# Patient Record
Sex: Female | Born: 1945 | Race: White | Hispanic: No
Health system: Southern US, Academic
[De-identification: ages and names within clinical notes are randomized; demographics above are authoritative.]

## PROBLEM LIST (undated history)

## (undated) ENCOUNTER — Encounter

## (undated) ENCOUNTER — Ambulatory Visit

## (undated) ENCOUNTER — Encounter: Attending: Nephrology | Primary: Nephrology

## (undated) ENCOUNTER — Ambulatory Visit: Payer: MEDICARE

## (undated) ENCOUNTER — Telehealth

## (undated) ENCOUNTER — Ambulatory Visit: Payer: MEDICARE | Attending: Nephrology | Primary: Nephrology

## (undated) ENCOUNTER — Ambulatory Visit
Payer: MEDICARE | Attending: Student in an Organized Health Care Education/Training Program | Primary: Student in an Organized Health Care Education/Training Program

## (undated) DIAGNOSIS — E119 Type 2 diabetes mellitus without complications: Secondary | ICD-10-CM

## (undated) DIAGNOSIS — J189 Pneumonia, unspecified organism: Secondary | ICD-10-CM

## (undated) DIAGNOSIS — N186 End stage renal disease: Secondary | ICD-10-CM

## (undated) DIAGNOSIS — C801 Malignant (primary) neoplasm, unspecified: Secondary | ICD-10-CM

## (undated) DIAGNOSIS — N289 Disorder of kidney and ureter, unspecified: Secondary | ICD-10-CM

## (undated) DIAGNOSIS — E039 Hypothyroidism, unspecified: Secondary | ICD-10-CM

## (undated) DIAGNOSIS — M1990 Unspecified osteoarthritis, unspecified site: Secondary | ICD-10-CM

## (undated) HISTORY — PX: TUBAL LIGATION: SHX77

## (undated) HISTORY — DX: Unspecified osteoarthritis, unspecified site: M19.90

## (undated) HISTORY — PX: TONSILLECTOMY: SUR1361

## (undated) HISTORY — PX: EYE SURGERY: SHX253

## (undated) HISTORY — DX: End stage renal disease: N18.6

## (undated) HISTORY — DX: Type 2 diabetes mellitus without complications: E11.9

## (undated) HISTORY — PX: SPINE SURGERY: SHX786

## (undated) HISTORY — DX: Malignant (primary) neoplasm, unspecified: C80.1

## (undated) HISTORY — PX: CATARACT EXTRACTION, BILATERAL: SHX1313

---

## 1997-12-25 HISTORY — PX: OTHER SURGICAL HISTORY: SHX169

## 2005-08-21 ENCOUNTER — Ambulatory Visit: Payer: Self-pay | Admitting: General Surgery

## 2005-12-12 ENCOUNTER — Ambulatory Visit: Payer: Self-pay | Admitting: Internal Medicine

## 2006-07-14 ENCOUNTER — Emergency Department: Payer: Self-pay | Admitting: Emergency Medicine

## 2007-05-05 ENCOUNTER — Emergency Department: Payer: Self-pay | Admitting: Emergency Medicine

## 2010-11-22 ENCOUNTER — Other Ambulatory Visit: Payer: Self-pay | Admitting: Nephrology

## 2010-11-28 ENCOUNTER — Ambulatory Visit: Payer: Self-pay | Admitting: Internal Medicine

## 2010-12-02 ENCOUNTER — Ambulatory Visit: Payer: Self-pay | Admitting: General Surgery

## 2010-12-02 HISTORY — PX: COLONOSCOPY: SHX174

## 2010-12-12 ENCOUNTER — Ambulatory Visit: Payer: Self-pay | Admitting: Internal Medicine

## 2011-04-10 DIAGNOSIS — E113599 Type 2 diabetes mellitus with proliferative diabetic retinopathy without macular edema, unspecified eye: Secondary | ICD-10-CM | POA: Insufficient documentation

## 2012-01-23 ENCOUNTER — Ambulatory Visit: Payer: Self-pay | Admitting: Internal Medicine

## 2012-12-25 HISTORY — PX: INSERTION OF DIALYSIS CATHETER: SHX1324

## 2013-01-23 ENCOUNTER — Ambulatory Visit: Payer: Self-pay | Admitting: Internal Medicine

## 2013-03-28 ENCOUNTER — Ambulatory Visit: Payer: Self-pay | Admitting: Internal Medicine

## 2013-07-30 ENCOUNTER — Ambulatory Visit: Payer: Self-pay | Admitting: Internal Medicine

## 2013-10-10 ENCOUNTER — Emergency Department: Payer: Self-pay | Admitting: Emergency Medicine

## 2013-11-03 ENCOUNTER — Ambulatory Visit: Payer: Self-pay | Admitting: Internal Medicine

## 2013-11-03 LAB — CBC CANCER CENTER
Eosinophil #: 0.2 x10 3/mm (ref 0.0–0.7)
Eosinophil %: 2.3 %
HGB: 13 g/dL (ref 12.0–16.0)
Lymphocyte #: 2.5 x10 3/mm (ref 1.0–3.6)
Lymphocyte %: 23 %
MCH: 29.3 pg (ref 26.0–34.0)
MCV: 90 fL (ref 80–100)
Monocyte #: 1 x10 3/mm — ABNORMAL HIGH (ref 0.2–0.9)
Monocyte %: 8.9 %
Neutrophil #: 7 x10 3/mm — ABNORMAL HIGH (ref 1.4–6.5)
Neutrophil %: 65.1 %
Platelet: 635 x10 3/mm — ABNORMAL HIGH (ref 150–440)
RBC: 4.46 10*6/uL (ref 3.80–5.20)
RDW: 16 % — ABNORMAL HIGH (ref 11.5–14.5)
WBC: 10.7 x10 3/mm (ref 3.6–11.0)

## 2013-11-03 LAB — SEDIMENTATION RATE: Erythrocyte Sed Rate: 20 mm/hr (ref 0–30)

## 2013-11-03 LAB — IRON AND TIBC
Iron Saturation: 12 %
Iron: 45 ug/dL — ABNORMAL LOW (ref 50–170)
Unbound Iron-Bind.Cap.: 339 ug/dL

## 2013-11-03 LAB — FERRITIN: Ferritin (ARMC): 16 ng/mL (ref 8–388)

## 2013-11-07 LAB — OCCULT BLOOD X 1 CARD TO LAB, STOOL
Occult Blood, Feces: NEGATIVE
Occult Blood, Feces: NEGATIVE

## 2013-11-24 ENCOUNTER — Ambulatory Visit: Payer: Self-pay | Admitting: Internal Medicine

## 2014-01-26 ENCOUNTER — Ambulatory Visit: Payer: Self-pay | Admitting: Internal Medicine

## 2014-03-17 ENCOUNTER — Ambulatory Visit: Payer: Self-pay | Admitting: Internal Medicine

## 2014-03-18 LAB — CBC CANCER CENTER
BASOS ABS: 0.1 x10 3/mm (ref 0.0–0.1)
BASOS PCT: 0.8 %
EOS ABS: 0.3 x10 3/mm (ref 0.0–0.7)
EOS PCT: 3.2 %
HCT: 43.9 % (ref 35.0–47.0)
HGB: 14.6 g/dL (ref 12.0–16.0)
LYMPHS PCT: 24.7 %
Lymphocyte #: 2.1 x10 3/mm (ref 1.0–3.6)
MCH: 31.6 pg (ref 26.0–34.0)
MCHC: 33.3 g/dL (ref 32.0–36.0)
MCV: 95 fL (ref 80–100)
MONO ABS: 0.9 x10 3/mm (ref 0.2–0.9)
Monocyte %: 10.6 %
Neutrophil #: 5.2 x10 3/mm (ref 1.4–6.5)
Neutrophil %: 60.7 %
Platelet: 524 x10 3/mm — ABNORMAL HIGH (ref 150–440)
RBC: 4.61 10*6/uL (ref 3.80–5.20)
RDW: 17.6 % — ABNORMAL HIGH (ref 11.5–14.5)
WBC: 8.5 x10 3/mm (ref 3.6–11.0)

## 2014-03-18 LAB — IRON AND TIBC
IRON BIND. CAP.(TOTAL): 275 ug/dL (ref 250–450)
Iron Saturation: 33 %
Iron: 91 ug/dL (ref 50–170)
Unbound Iron-Bind.Cap.: 184 ug/dL

## 2014-03-18 LAB — FERRITIN: Ferritin (ARMC): 237 ng/mL (ref 8–388)

## 2014-03-25 ENCOUNTER — Ambulatory Visit: Payer: Self-pay | Admitting: Internal Medicine

## 2014-08-13 ENCOUNTER — Ambulatory Visit: Payer: Self-pay | Admitting: Internal Medicine

## 2014-08-14 LAB — IRON AND TIBC
IRON BIND. CAP.(TOTAL): 336 ug/dL (ref 250–450)
Iron Saturation: 24 %
Iron: 80 ug/dL (ref 50–170)
UNBOUND IRON-BIND. CAP.: 256 ug/dL

## 2014-08-14 LAB — CBC CANCER CENTER
Basophil #: 0.1 x10 3/mm (ref 0.0–0.1)
Basophil %: 1.1 %
Eosinophil #: 0.3 x10 3/mm (ref 0.0–0.7)
Eosinophil %: 3.2 %
HCT: 45.9 % (ref 35.0–47.0)
HGB: 14.9 g/dL (ref 12.0–16.0)
LYMPHS PCT: 22.1 %
Lymphocyte #: 2.3 x10 3/mm (ref 1.0–3.6)
MCH: 31.7 pg (ref 26.0–34.0)
MCHC: 32.4 g/dL (ref 32.0–36.0)
MCV: 98 fL (ref 80–100)
Monocyte #: 1.2 x10 3/mm — ABNORMAL HIGH (ref 0.2–0.9)
Monocyte %: 11.1 %
NEUTROS ABS: 6.6 x10 3/mm — AB (ref 1.4–6.5)
Neutrophil %: 62.5 %
PLATELETS: 496 x10 3/mm — AB (ref 150–440)
RBC: 4.69 10*6/uL (ref 3.80–5.20)
RDW: 15.5 % — AB (ref 11.5–14.5)
WBC: 10.6 x10 3/mm (ref 3.6–11.0)

## 2014-08-14 LAB — FERRITIN: Ferritin (ARMC): 29 ng/mL (ref 8–388)

## 2014-08-19 LAB — CANCER CENTER HEMATOCRIT: HCT: 44.4 % (ref 35.0–47.0)

## 2014-08-25 ENCOUNTER — Ambulatory Visit: Payer: Self-pay | Admitting: Internal Medicine

## 2014-08-26 LAB — CANCER CENTER HEMATOCRIT: HCT: 41.6 % (ref 35.0–47.0)

## 2014-09-02 LAB — CANCER CENTER HEMATOCRIT: HCT: 42 % (ref 35.0–47.0)

## 2014-09-09 LAB — CBC CANCER CENTER
Basophil #: 0.1 x10 3/mm (ref 0.0–0.1)
Basophil %: 0.6 %
EOS ABS: 0.3 x10 3/mm (ref 0.0–0.7)
Eosinophil %: 2 %
HCT: 44.2 % (ref 35.0–47.0)
HGB: 14.2 g/dL (ref 12.0–16.0)
Lymphocyte #: 2.3 x10 3/mm (ref 1.0–3.6)
Lymphocyte %: 16.2 %
MCH: 31 pg (ref 26.0–34.0)
MCHC: 32.1 g/dL (ref 32.0–36.0)
MCV: 96 fL (ref 80–100)
MONO ABS: 1.2 x10 3/mm — AB (ref 0.2–0.9)
Monocyte %: 8.3 %
NEUTROS ABS: 10.3 x10 3/mm — AB (ref 1.4–6.5)
NEUTROS PCT: 72.9 %
Platelet: 581 x10 3/mm — ABNORMAL HIGH (ref 150–440)
RBC: 4.58 10*6/uL (ref 3.80–5.20)
RDW: 15.5 % — ABNORMAL HIGH (ref 11.5–14.5)
WBC: 14.1 x10 3/mm — ABNORMAL HIGH (ref 3.6–11.0)

## 2014-09-24 ENCOUNTER — Ambulatory Visit: Payer: Self-pay | Admitting: Internal Medicine

## 2014-09-30 LAB — CANCER CENTER HEMATOCRIT: HCT: 38.9 % (ref 35.0–47.0)

## 2014-10-20 HISTORY — PX: NEPHRECTOMY TRANSPLANTED ORGAN: SUR880

## 2014-10-25 ENCOUNTER — Ambulatory Visit: Payer: Self-pay | Admitting: Internal Medicine

## 2014-11-02 ENCOUNTER — Ambulatory Visit: Payer: Self-pay | Admitting: Nephrology

## 2014-11-02 LAB — BASIC METABOLIC PANEL
Anion Gap: 6 — ABNORMAL LOW (ref 7–16)
BUN: 17 mg/dL (ref 7–18)
CALCIUM: 8.2 mg/dL — AB (ref 8.5–10.1)
Chloride: 102 mmol/L (ref 98–107)
Co2: 29 mmol/L (ref 21–32)
Creatinine: 1.19 mg/dL (ref 0.60–1.30)
EGFR (Non-African Amer.): 48 — ABNORMAL LOW
GFR CALC AF AMER: 58 — AB
Glucose: 152 mg/dL — ABNORMAL HIGH (ref 65–99)
Osmolality: 278 (ref 275–301)
POTASSIUM: 4.1 mmol/L (ref 3.5–5.1)
SODIUM: 137 mmol/L (ref 136–145)

## 2014-11-02 LAB — CBC WITH DIFFERENTIAL/PLATELET
BASOS ABS: 0 10*3/uL (ref 0.0–0.1)
Basophil %: 0.6 %
Eosinophil #: 0.1 10*3/uL (ref 0.0–0.7)
Eosinophil %: 1.9 %
HCT: 35.6 % (ref 35.0–47.0)
HGB: 11.4 g/dL — AB (ref 12.0–16.0)
Lymphocyte #: 0.6 10*3/uL — ABNORMAL LOW (ref 1.0–3.6)
Lymphocyte %: 8 %
MCH: 28.3 pg (ref 26.0–34.0)
MCHC: 32 g/dL (ref 32.0–36.0)
MCV: 89 fL (ref 80–100)
MONOS PCT: 4.6 %
Monocyte #: 0.3 x10 3/mm (ref 0.2–0.9)
Neutrophil #: 6.1 10*3/uL (ref 1.4–6.5)
Neutrophil %: 84.9 %
Platelet: 529 10*3/uL — ABNORMAL HIGH (ref 150–440)
RBC: 4.01 10*6/uL (ref 3.80–5.20)
RDW: 16.1 % — ABNORMAL HIGH (ref 11.5–14.5)
WBC: 7.2 10*3/uL (ref 3.6–11.0)

## 2014-11-02 LAB — ALT: SGPT (ALT): 36 U/L

## 2014-11-02 LAB — ALKALINE PHOSPHATASE: Alkaline Phosphatase: 81 U/L

## 2014-11-02 LAB — BILIRUBIN, TOTAL: BILIRUBIN TOTAL: 0.6 mg/dL (ref 0.2–1.0)

## 2014-11-02 LAB — SGOT (AST)(ARMC): AST: 27 U/L (ref 15–37)

## 2014-11-02 LAB — PHOSPHORUS: Phosphorus: 2.8 mg/dL (ref 2.5–4.9)

## 2014-11-02 LAB — ALBUMIN: ALBUMIN: 2.8 g/dL — AB (ref 3.4–5.0)

## 2014-11-02 LAB — GAMMA GT: GGT: 13 U/L (ref 5–85)

## 2014-11-02 LAB — MAGNESIUM: Magnesium: 1.8 mg/dL

## 2014-11-09 ENCOUNTER — Encounter: Payer: Self-pay | Admitting: Nephrology

## 2014-11-09 LAB — BASIC METABOLIC PANEL
Anion Gap: 7 (ref 7–16)
BUN: 13 mg/dL (ref 7–18)
CO2: 29 mmol/L (ref 21–32)
Calcium, Total: 8.8 mg/dL (ref 8.5–10.1)
Chloride: 103 mmol/L (ref 98–107)
Creatinine: 1.19 mg/dL (ref 0.60–1.30)
GFR CALC AF AMER: 58 — AB
GFR CALC NON AF AMER: 48 — AB
Glucose: 131 mg/dL — ABNORMAL HIGH (ref 65–99)
Osmolality: 279 (ref 275–301)
Potassium: 4.4 mmol/L (ref 3.5–5.1)
Sodium: 139 mmol/L (ref 136–145)

## 2014-11-09 LAB — MAGNESIUM: Magnesium: 2.2 mg/dL

## 2014-11-09 LAB — CBC WITH DIFFERENTIAL/PLATELET
Basophil #: 0.1 10*3/uL (ref 0.0–0.1)
Basophil %: 1.3 %
EOS PCT: 3.1 %
Eosinophil #: 0.1 10*3/uL (ref 0.0–0.7)
HCT: 36.6 % (ref 35.0–47.0)
HGB: 11.6 g/dL — AB (ref 12.0–16.0)
Lymphocyte #: 0.6 10*3/uL — ABNORMAL LOW (ref 1.0–3.6)
Lymphocyte %: 13.6 %
MCH: 28 pg (ref 26.0–34.0)
MCHC: 31.6 g/dL — ABNORMAL LOW (ref 32.0–36.0)
MCV: 88 fL (ref 80–100)
MONOS PCT: 13.4 %
Monocyte #: 0.6 x10 3/mm (ref 0.2–0.9)
NEUTROS PCT: 68.6 %
Neutrophil #: 3 10*3/uL (ref 1.4–6.5)
Platelet: 434 10*3/uL (ref 150–440)
RBC: 4.14 10*6/uL (ref 3.80–5.20)
RDW: 16.1 % — ABNORMAL HIGH (ref 11.5–14.5)
WBC: 4.4 10*3/uL (ref 3.6–11.0)

## 2014-11-09 LAB — PHOSPHORUS: Phosphorus: 3.8 mg/dL (ref 2.5–4.9)

## 2014-11-11 LAB — BASIC METABOLIC PANEL
Anion Gap: 9 (ref 7–16)
BUN: 14 mg/dL (ref 7–18)
Calcium, Total: 8.6 mg/dL (ref 8.5–10.1)
Chloride: 103 mmol/L (ref 98–107)
Co2: 28 mmol/L (ref 21–32)
Creatinine: 1.14 mg/dL (ref 0.60–1.30)
EGFR (African American): >60
GFR CALC NON AF AMER: 50 — AB
GLUCOSE: 136 mg/dL — AB (ref 65–99)
Osmolality: 282 (ref 275–301)
Potassium: 4 mmol/L (ref 3.5–5.1)
Sodium: 140 mmol/L (ref 136–145)

## 2014-11-11 LAB — CBC WITH DIFFERENTIAL/PLATELET
BASOS ABS: 0.1 10*3/uL (ref 0.0–0.1)
Basophil %: 2.3 %
EOS PCT: 3.2 %
Eosinophil #: 0.2 10*3/uL (ref 0.0–0.7)
HCT: 34.5 % — ABNORMAL LOW (ref 35.0–47.0)
HGB: 10.9 g/dL — AB (ref 12.0–16.0)
LYMPHS ABS: 0.6 10*3/uL — AB (ref 1.0–3.6)
Lymphocyte %: 13.3 %
MCH: 27.5 pg (ref 26.0–34.0)
MCHC: 31.6 g/dL — ABNORMAL LOW (ref 32.0–36.0)
MCV: 87 fL (ref 80–100)
MONOS PCT: 12.9 %
Monocyte #: 0.6 x10 3/mm (ref 0.2–0.9)
Neutrophil #: 3.2 10*3/uL (ref 1.4–6.5)
Neutrophil %: 68.3 %
Platelet: 421 10*3/uL (ref 150–440)
RBC: 3.98 10*6/uL (ref 3.80–5.20)
RDW: 16.2 % — ABNORMAL HIGH (ref 11.5–14.5)
WBC: 4.7 10*3/uL (ref 3.6–11.0)

## 2014-11-11 LAB — MAGNESIUM: MAGNESIUM: 2.1 mg/dL

## 2014-11-11 LAB — PHOSPHORUS: Phosphorus: 3.4 mg/dL (ref 2.5–4.9)

## 2014-11-13 LAB — CBC WITH DIFFERENTIAL/PLATELET
Basophil #: 0.1 10*3/uL (ref 0.0–0.1)
Basophil %: 1.9 %
EOS ABS: 0.1 10*3/uL (ref 0.0–0.7)
Eosinophil %: 1.7 %
HCT: 35.2 % (ref 35.0–47.0)
HGB: 11.1 g/dL — AB (ref 12.0–16.0)
LYMPHS PCT: 8.9 %
Lymphocyte #: 0.6 10*3/uL — ABNORMAL LOW (ref 1.0–3.6)
MCH: 27.6 pg (ref 26.0–34.0)
MCHC: 31.5 g/dL — AB (ref 32.0–36.0)
MCV: 88 fL (ref 80–100)
MONO ABS: 0.6 x10 3/mm (ref 0.2–0.9)
Monocyte %: 9.5 %
NEUTROS PCT: 78 %
Neutrophil #: 4.9 10*3/uL (ref 1.4–6.5)
Platelet: 413 10*3/uL (ref 150–440)
RBC: 4.02 10*6/uL (ref 3.80–5.20)
RDW: 16 % — ABNORMAL HIGH (ref 11.5–14.5)
WBC: 6.2 10*3/uL (ref 3.6–11.0)

## 2014-11-13 LAB — BASIC METABOLIC PANEL
ANION GAP: 7 (ref 7–16)
BUN: 15 mg/dL (ref 7–18)
Calcium, Total: 8.9 mg/dL (ref 8.5–10.1)
Chloride: 103 mmol/L (ref 98–107)
Co2: 30 mmol/L (ref 21–32)
Creatinine: 1.09 mg/dL (ref 0.60–1.30)
EGFR (African American): >60
EGFR (Non-African Amer.): 53 — ABNORMAL LOW
Glucose: 201 mg/dL — ABNORMAL HIGH (ref 65–99)
Osmolality: 286 (ref 275–301)
Potassium: 4.4 mmol/L (ref 3.5–5.1)
Sodium: 140 mmol/L (ref 136–145)

## 2014-11-13 LAB — PHOSPHORUS: PHOSPHORUS: 3.2 mg/dL (ref 2.5–4.9)

## 2014-11-13 LAB — MAGNESIUM: MAGNESIUM: 2.1 mg/dL

## 2014-11-16 LAB — CBC WITH DIFFERENTIAL/PLATELET
BASOS PCT: 0.9 %
Basophil #: 0.1 10*3/uL (ref 0.0–0.1)
EOS ABS: 0.2 10*3/uL (ref 0.0–0.7)
Eosinophil %: 2.7 %
HCT: 34.9 % — AB (ref 35.0–47.0)
HGB: 10.8 g/dL — AB (ref 12.0–16.0)
Lymphocyte #: 0.9 10*3/uL — ABNORMAL LOW (ref 1.0–3.6)
Lymphocyte %: 15.2 %
MCH: 26.8 pg (ref 26.0–34.0)
MCHC: 31.1 g/dL — AB (ref 32.0–36.0)
MCV: 86 fL (ref 80–100)
MONO ABS: 0.7 x10 3/mm (ref 0.2–0.9)
Monocyte %: 11.9 %
NEUTROS ABS: 3.9 10*3/uL (ref 1.4–6.5)
NEUTROS PCT: 69.3 %
PLATELETS: 461 10*3/uL — AB (ref 150–440)
RBC: 4.04 10*6/uL (ref 3.80–5.20)
RDW: 15.8 % — ABNORMAL HIGH (ref 11.5–14.5)
WBC: 5.6 10*3/uL (ref 3.6–11.0)

## 2014-11-16 LAB — BASIC METABOLIC PANEL
Anion Gap: 8 (ref 7–16)
BUN: 15 mg/dL (ref 7–18)
Calcium, Total: 8.8 mg/dL (ref 8.5–10.1)
Chloride: 103 mmol/L (ref 98–107)
Co2: 30 mmol/L (ref 21–32)
Creatinine: 0.88 mg/dL (ref 0.60–1.30)
EGFR (African American): >60
Glucose: 163 mg/dL — ABNORMAL HIGH (ref 65–99)
Osmolality: 286 (ref 275–301)
POTASSIUM: 4.6 mmol/L (ref 3.5–5.1)
SODIUM: 141 mmol/L (ref 136–145)

## 2014-11-16 LAB — PHOSPHORUS: Phosphorus: 3.5 mg/dL (ref 2.5–4.9)

## 2014-11-16 LAB — MAGNESIUM: Magnesium: 1.6 mg/dL — ABNORMAL LOW

## 2014-11-20 LAB — BASIC METABOLIC PANEL
ANION GAP: 9 (ref 7–16)
BUN: 16 mg/dL (ref 7–18)
CALCIUM: 8.5 mg/dL (ref 8.5–10.1)
CHLORIDE: 104 mmol/L (ref 98–107)
CO2: 27 mmol/L (ref 21–32)
Creatinine: 0.86 mg/dL (ref 0.60–1.30)
EGFR (Non-African Amer.): >60
GLUCOSE: 104 mg/dL — AB (ref 65–99)
OSMOLALITY: 281 (ref 275–301)
Potassium: 4.2 mmol/L (ref 3.5–5.1)
Sodium: 140 mmol/L (ref 136–145)

## 2014-11-20 LAB — CBC WITH DIFFERENTIAL/PLATELET
Basophil #: 0 10*3/uL (ref 0.0–0.1)
Basophil %: 0.6 %
EOS ABS: 0.1 10*3/uL (ref 0.0–0.7)
Eosinophil %: 2.4 %
HCT: 36.7 % (ref 35.0–47.0)
HGB: 11.4 g/dL — ABNORMAL LOW (ref 12.0–16.0)
Lymphocyte #: 0.9 10*3/uL — ABNORMAL LOW (ref 1.0–3.6)
Lymphocyte %: 14.4 %
MCH: 26.4 pg (ref 26.0–34.0)
MCHC: 31 g/dL — AB (ref 32.0–36.0)
MCV: 85 fL (ref 80–100)
MONO ABS: 0.8 x10 3/mm (ref 0.2–0.9)
Monocyte %: 14 %
Neutrophil #: 4.1 10*3/uL (ref 1.4–6.5)
Neutrophil %: 68.6 %
Platelet: 415 10*3/uL (ref 150–440)
RBC: 4.31 10*6/uL (ref 3.80–5.20)
RDW: 16.2 % — ABNORMAL HIGH (ref 11.5–14.5)
WBC: 6 10*3/uL (ref 3.6–11.0)

## 2014-11-20 LAB — MAGNESIUM: MAGNESIUM: 1.8 mg/dL

## 2014-11-20 LAB — PHOSPHORUS: PHOSPHORUS: 3.5 mg/dL (ref 2.5–4.9)

## 2014-11-23 ENCOUNTER — Ambulatory Visit: Payer: Self-pay | Admitting: Nephrology

## 2014-11-23 LAB — BASIC METABOLIC PANEL
ANION GAP: 10 (ref 7–16)
BUN: 17 mg/dL (ref 7–18)
CALCIUM: 8.6 mg/dL (ref 8.5–10.1)
Chloride: 104 mmol/L (ref 98–107)
Co2: 27 mmol/L (ref 21–32)
Creatinine: 0.87 mg/dL (ref 0.60–1.30)
EGFR (African American): >60
EGFR (Non-African Amer.): >60
GLUCOSE: 133 mg/dL — AB (ref 65–99)
Osmolality: 285 (ref 275–301)
Potassium: 4.4 mmol/L (ref 3.5–5.1)
Sodium: 141 mmol/L (ref 136–145)

## 2014-11-23 LAB — CBC WITH DIFFERENTIAL/PLATELET
Basophil #: 0.1 10*3/uL (ref 0.0–0.1)
Basophil %: 1.5 %
EOS ABS: 0.1 10*3/uL (ref 0.0–0.7)
EOS PCT: 1.3 %
HCT: 34.7 % — ABNORMAL LOW (ref 35.0–47.0)
HGB: 10.8 g/dL — ABNORMAL LOW (ref 12.0–16.0)
Lymphocyte #: 1 10*3/uL (ref 1.0–3.6)
Lymphocyte %: 15.4 %
MCH: 26.2 pg (ref 26.0–34.0)
MCHC: 31 g/dL — AB (ref 32.0–36.0)
MCV: 85 fL (ref 80–100)
MONOS PCT: 15.3 %
Monocyte #: 1 x10 3/mm — ABNORMAL HIGH (ref 0.2–0.9)
Neutrophil #: 4.4 10*3/uL (ref 1.4–6.5)
Neutrophil %: 66.5 %
Platelet: 416 10*3/uL (ref 150–440)
RBC: 4.11 10*6/uL (ref 3.80–5.20)
RDW: 16.3 % — AB (ref 11.5–14.5)
WBC: 6.6 10*3/uL (ref 3.6–11.0)

## 2014-11-23 LAB — MAGNESIUM: Magnesium: 1.6 mg/dL — ABNORMAL LOW

## 2014-11-23 LAB — PHOSPHORUS: PHOSPHORUS: 3.7 mg/dL (ref 2.5–4.9)

## 2014-11-24 ENCOUNTER — Encounter: Payer: Self-pay | Admitting: Nephrology

## 2014-11-26 DIAGNOSIS — Z48298 Encounter for aftercare following other organ transplant: Secondary | ICD-10-CM | POA: Insufficient documentation

## 2014-11-26 DIAGNOSIS — Z87891 Personal history of nicotine dependence: Secondary | ICD-10-CM | POA: Insufficient documentation

## 2014-11-26 DIAGNOSIS — E039 Hypothyroidism, unspecified: Secondary | ICD-10-CM | POA: Insufficient documentation

## 2014-11-26 DIAGNOSIS — D849 Immunodeficiency, unspecified: Secondary | ICD-10-CM | POA: Insufficient documentation

## 2014-11-27 LAB — CBC WITH DIFFERENTIAL/PLATELET
BASOS ABS: 0.1 10*3/uL (ref 0.0–0.1)
BASOS PCT: 1.2 %
EOS ABS: 0.1 10*3/uL (ref 0.0–0.7)
EOS PCT: 1.3 %
HCT: 34.1 % — AB (ref 35.0–47.0)
HGB: 10.6 g/dL — AB (ref 12.0–16.0)
LYMPHS PCT: 13.3 %
Lymphocyte #: 1 10*3/uL (ref 1.0–3.6)
MCH: 25.7 pg — ABNORMAL LOW (ref 26.0–34.0)
MCHC: 31.2 g/dL — ABNORMAL LOW (ref 32.0–36.0)
MCV: 83 fL (ref 80–100)
Monocyte #: 0.9 x10 3/mm (ref 0.2–0.9)
Monocyte %: 13.3 %
Neutrophil #: 5.1 10*3/uL (ref 1.4–6.5)
Neutrophil %: 70.9 %
Platelet: 375 10*3/uL (ref 150–440)
RBC: 4.13 10*6/uL (ref 3.80–5.20)
RDW: 16.6 % — AB (ref 11.5–14.5)
WBC: 7.2 10*3/uL (ref 3.6–11.0)

## 2014-11-27 LAB — BASIC METABOLIC PANEL
ANION GAP: 11 (ref 7–16)
BUN: 20 mg/dL — AB (ref 7–18)
CO2: 27 mmol/L (ref 21–32)
CREATININE: 0.9 mg/dL (ref 0.60–1.30)
Calcium, Total: 8.9 mg/dL (ref 8.5–10.1)
Chloride: 101 mmol/L (ref 98–107)
EGFR (African American): >60
EGFR (Non-African Amer.): >60
Glucose: 180 mg/dL — ABNORMAL HIGH (ref 65–99)
Osmolality: 285 (ref 275–301)
Potassium: 4.2 mmol/L (ref 3.5–5.1)
Sodium: 139 mmol/L (ref 136–145)

## 2014-11-27 LAB — MAGNESIUM: MAGNESIUM: 1.7 mg/dL — AB

## 2014-11-27 LAB — PHOSPHORUS: Phosphorus: 3.3 mg/dL (ref 2.5–4.9)

## 2014-11-30 LAB — BASIC METABOLIC PANEL
ANION GAP: 7 (ref 7–16)
BUN: 20 mg/dL — ABNORMAL HIGH (ref 7–18)
CALCIUM: 8.9 mg/dL (ref 8.5–10.1)
CO2: 29 mmol/L (ref 21–32)
Chloride: 101 mmol/L (ref 98–107)
Creatinine: 0.89 mg/dL (ref 0.60–1.30)
EGFR (Non-African Amer.): >60
GLUCOSE: 205 mg/dL — AB (ref 65–99)
OSMOLALITY: 282 (ref 275–301)
Potassium: 4.2 mmol/L (ref 3.5–5.1)
Sodium: 137 mmol/L (ref 136–145)

## 2014-11-30 LAB — CBC WITH DIFFERENTIAL/PLATELET
BASOS ABS: 0.1 10*3/uL (ref 0.0–0.1)
Basophil %: 0.7 %
Eosinophil #: 0.1 10*3/uL (ref 0.0–0.7)
Eosinophil %: 1 %
HCT: 35.3 % (ref 35.0–47.0)
HGB: 11.1 g/dL — ABNORMAL LOW (ref 12.0–16.0)
Lymphocyte #: 1.2 10*3/uL (ref 1.0–3.6)
Lymphocyte %: 15.1 %
MCH: 25.8 pg — ABNORMAL LOW (ref 26.0–34.0)
MCHC: 31.4 g/dL — ABNORMAL LOW (ref 32.0–36.0)
MCV: 82 fL (ref 80–100)
Monocyte #: 0.8 x10 3/mm (ref 0.2–0.9)
Monocyte %: 9.4 %
NEUTROS PCT: 73.8 %
Neutrophil #: 5.9 10*3/uL (ref 1.4–6.5)
Platelet: 416 10*3/uL (ref 150–440)
RBC: 4.3 10*6/uL (ref 3.80–5.20)
RDW: 16.7 % — AB (ref 11.5–14.5)
WBC: 8 10*3/uL (ref 3.6–11.0)

## 2014-11-30 LAB — MAGNESIUM: Magnesium: 1.8 mg/dL

## 2014-11-30 LAB — PHOSPHORUS: PHOSPHORUS: 3.4 mg/dL (ref 2.5–4.9)

## 2014-12-02 LAB — BASIC METABOLIC PANEL
Anion Gap: 7 (ref 7–16)
BUN: 25 mg/dL — ABNORMAL HIGH (ref 7–18)
Calcium, Total: 8.9 mg/dL (ref 8.5–10.1)
Chloride: 103 mmol/L (ref 98–107)
Co2: 28 mmol/L (ref 21–32)
Creatinine: 0.84 mg/dL (ref 0.60–1.30)
EGFR (African American): >60
EGFR (Non-African Amer.): >60
GLUCOSE: 235 mg/dL — AB (ref 65–99)
Osmolality: 288 (ref 275–301)
Potassium: 4.5 mmol/L (ref 3.5–5.1)
SODIUM: 138 mmol/L (ref 136–145)

## 2014-12-02 LAB — CBC WITH DIFFERENTIAL/PLATELET
BASOS PCT: 2 %
Basophil #: 0.2 10*3/uL — ABNORMAL HIGH (ref 0.0–0.1)
Eosinophil #: 0.1 10*3/uL (ref 0.0–0.7)
Eosinophil %: 1.1 %
HCT: 34.6 % — AB (ref 35.0–47.0)
HGB: 10.8 g/dL — AB (ref 12.0–16.0)
LYMPHS PCT: 16.5 %
Lymphocyte #: 1.3 10*3/uL (ref 1.0–3.6)
MCH: 25.7 pg — ABNORMAL LOW (ref 26.0–34.0)
MCHC: 31.4 g/dL — ABNORMAL LOW (ref 32.0–36.0)
MCV: 82 fL (ref 80–100)
MONO ABS: 0.7 x10 3/mm (ref 0.2–0.9)
Monocyte %: 8.9 %
Neutrophil #: 5.7 10*3/uL (ref 1.4–6.5)
Neutrophil %: 71.5 %
Platelet: 427 10*3/uL (ref 150–440)
RBC: 4.22 10*6/uL (ref 3.80–5.20)
RDW: 16.8 % — AB (ref 11.5–14.5)
WBC: 7.9 10*3/uL (ref 3.6–11.0)

## 2014-12-02 LAB — PHOSPHORUS: Phosphorus: 3.7 mg/dL (ref 2.5–4.9)

## 2014-12-02 LAB — MAGNESIUM: MAGNESIUM: 1.9 mg/dL

## 2014-12-04 LAB — BASIC METABOLIC PANEL
Anion Gap: 6 — ABNORMAL LOW (ref 7–16)
BUN: 25 mg/dL — ABNORMAL HIGH (ref 7–18)
CALCIUM: 8.8 mg/dL (ref 8.5–10.1)
Chloride: 103 mmol/L (ref 98–107)
Co2: 29 mmol/L (ref 21–32)
Creatinine: 0.85 mg/dL (ref 0.60–1.30)
EGFR (African American): >60
EGFR (Non-African Amer.): >60
GLUCOSE: 254 mg/dL — AB (ref 65–99)
OSMOLALITY: 289 (ref 275–301)
Potassium: 4.7 mmol/L (ref 3.5–5.1)
Sodium: 138 mmol/L (ref 136–145)

## 2014-12-04 LAB — CBC WITH DIFFERENTIAL/PLATELET
Bands: 1 %
EOS PCT: 1 %
HCT: 34.7 % — ABNORMAL LOW (ref 35.0–47.0)
HGB: 10.5 g/dL — AB (ref 12.0–16.0)
LYMPHS PCT: 9 %
MCH: 24.7 pg — AB (ref 26.0–34.0)
MCHC: 30.3 g/dL — AB (ref 32.0–36.0)
MCV: 82 fL (ref 80–100)
METAMYELOCYTE: 2 %
MONOS PCT: 5 %
MYELOCYTE: 2 %
Platelet: 459 10*3/uL — ABNORMAL HIGH (ref 150–440)
RBC: 4.25 10*6/uL (ref 3.80–5.20)
RDW: 17 % — ABNORMAL HIGH (ref 11.5–14.5)
SEGMENTED NEUTROPHILS: 80 %
WBC: 8.3 10*3/uL (ref 3.6–11.0)

## 2014-12-04 LAB — MAGNESIUM: MAGNESIUM: 1.9 mg/dL

## 2014-12-04 LAB — PHOSPHORUS: PHOSPHORUS: 3.6 mg/dL (ref 2.5–4.9)

## 2014-12-07 LAB — CBC WITH DIFFERENTIAL/PLATELET
Bands: 2 %
EOS PCT: 3 %
HCT: 35.3 % (ref 35.0–47.0)
HGB: 11 g/dL — AB (ref 12.0–16.0)
Lymphocytes: 12 %
MCH: 25 pg — ABNORMAL LOW (ref 26.0–34.0)
MCHC: 31 g/dL — ABNORMAL LOW (ref 32.0–36.0)
MCV: 81 fL (ref 80–100)
MYELOCYTE: 2 %
Metamyelocyte: 1 %
Monocytes: 8 %
PLATELETS: 487 10*3/uL — AB (ref 150–440)
RBC: 4.38 10*6/uL (ref 3.80–5.20)
RDW: 16.6 % — AB (ref 11.5–14.5)
Segmented Neutrophils: 72 %
WBC: 6.8 10*3/uL (ref 3.6–11.0)

## 2014-12-07 LAB — BASIC METABOLIC PANEL
ANION GAP: 9 (ref 7–16)
BUN: 20 mg/dL — ABNORMAL HIGH (ref 7–18)
Calcium, Total: 8.9 mg/dL (ref 8.5–10.1)
Chloride: 101 mmol/L (ref 98–107)
Co2: 28 mmol/L (ref 21–32)
Creatinine: 0.85 mg/dL (ref 0.60–1.30)
EGFR (Non-African Amer.): >60
Glucose: 205 mg/dL — ABNORMAL HIGH (ref 65–99)
Osmolality: 284 (ref 275–301)
Potassium: 4.1 mmol/L (ref 3.5–5.1)
Sodium: 138 mmol/L (ref 136–145)

## 2014-12-07 LAB — MAGNESIUM: Magnesium: 1.9 mg/dL

## 2014-12-07 LAB — PHOSPHORUS: Phosphorus: 3.4 mg/dL (ref 2.5–4.9)

## 2014-12-09 LAB — BASIC METABOLIC PANEL
Anion Gap: 8 (ref 7–16)
BUN: 17 mg/dL (ref 7–18)
CALCIUM: 8.9 mg/dL (ref 8.5–10.1)
CHLORIDE: 102 mmol/L (ref 98–107)
Co2: 28 mmol/L (ref 21–32)
Creatinine: 0.9 mg/dL (ref 0.60–1.30)
EGFR (African American): >60
EGFR (Non-African Amer.): >60
GLUCOSE: 221 mg/dL — AB (ref 65–99)
Osmolality: 284 (ref 275–301)
Potassium: 4.8 mmol/L (ref 3.5–5.1)
Sodium: 138 mmol/L (ref 136–145)

## 2014-12-09 LAB — CBC WITH DIFFERENTIAL/PLATELET
BASOS ABS: 0.1 10*3/uL (ref 0.0–0.1)
BASOS PCT: 1.2 %
EOS PCT: 1.6 %
Eosinophil #: 0.1 10*3/uL (ref 0.0–0.7)
HCT: 36.9 % (ref 35.0–47.0)
HGB: 11.6 g/dL — AB (ref 12.0–16.0)
LYMPHS ABS: 1.5 10*3/uL (ref 1.0–3.6)
LYMPHS PCT: 18.8 %
MCH: 25.1 pg — AB (ref 26.0–34.0)
MCHC: 31.4 g/dL — ABNORMAL LOW (ref 32.0–36.0)
MCV: 80 fL (ref 80–100)
MONOS PCT: 6.2 %
Monocyte #: 0.5 x10 3/mm (ref 0.2–0.9)
Neutrophil #: 5.9 10*3/uL (ref 1.4–6.5)
Neutrophil %: 72.2 %
PLATELETS: 491 10*3/uL — AB (ref 150–440)
RBC: 4.61 10*6/uL (ref 3.80–5.20)
RDW: 17.1 % — ABNORMAL HIGH (ref 11.5–14.5)
WBC: 8.2 10*3/uL (ref 3.6–11.0)

## 2014-12-09 LAB — PHOSPHORUS: PHOSPHORUS: 3.6 mg/dL (ref 2.5–4.9)

## 2014-12-09 LAB — MAGNESIUM: MAGNESIUM: 1.9 mg/dL

## 2014-12-14 LAB — CBC WITH DIFFERENTIAL/PLATELET
BASOS PCT: 0.2 %
Basophil #: 0 10*3/uL (ref 0.0–0.1)
EOS ABS: 0.1 10*3/uL (ref 0.0–0.7)
EOS PCT: 1.6 %
HCT: 35.1 % (ref 35.0–47.0)
HGB: 10.7 g/dL — ABNORMAL LOW (ref 12.0–16.0)
Lymphocyte #: 1.4 10*3/uL (ref 1.0–3.6)
Lymphocyte %: 19.3 %
MCH: 24.3 pg — AB (ref 26.0–34.0)
MCHC: 30.5 g/dL — ABNORMAL LOW (ref 32.0–36.0)
MCV: 80 fL (ref 80–100)
MONOS PCT: 7.4 %
Monocyte #: 0.5 x10 3/mm (ref 0.2–0.9)
NEUTROS PCT: 71.5 %
Neutrophil #: 5.3 10*3/uL (ref 1.4–6.5)
Platelet: 434 10*3/uL (ref 150–440)
RBC: 4.41 10*6/uL (ref 3.80–5.20)
RDW: 17.3 % — ABNORMAL HIGH (ref 11.5–14.5)
WBC: 7.4 10*3/uL (ref 3.6–11.0)

## 2014-12-14 LAB — BASIC METABOLIC PANEL
Anion Gap: 9 (ref 7–16)
BUN: 17 mg/dL (ref 7–18)
CALCIUM: 8.8 mg/dL (ref 8.5–10.1)
CO2: 29 mmol/L (ref 21–32)
Chloride: 102 mmol/L (ref 98–107)
Creatinine: 0.89 mg/dL (ref 0.60–1.30)
Glucose: 150 mg/dL — ABNORMAL HIGH (ref 65–99)
OSMOLALITY: 284 (ref 275–301)
Potassium: 4.2 mmol/L (ref 3.5–5.1)
Sodium: 140 mmol/L (ref 136–145)

## 2014-12-14 LAB — PHOSPHORUS: Phosphorus: 3.6 mg/dL (ref 2.5–4.9)

## 2014-12-14 LAB — MAGNESIUM: Magnesium: 1.7 mg/dL — ABNORMAL LOW

## 2014-12-17 LAB — CBC WITH DIFFERENTIAL/PLATELET
Basophil #: 0 10*3/uL (ref 0.0–0.1)
Basophil %: 0.6 %
EOS PCT: 2 %
Eosinophil #: 0.1 10*3/uL (ref 0.0–0.7)
HCT: 35.8 % (ref 35.0–47.0)
HGB: 11 g/dL — ABNORMAL LOW (ref 12.0–16.0)
LYMPHS PCT: 19.6 %
Lymphocyte #: 1.5 10*3/uL (ref 1.0–3.6)
MCH: 24.2 pg — ABNORMAL LOW (ref 26.0–34.0)
MCHC: 30.6 g/dL — ABNORMAL LOW (ref 32.0–36.0)
MCV: 79 fL — ABNORMAL LOW (ref 80–100)
MONOS PCT: 8.3 %
Monocyte #: 0.6 x10 3/mm (ref 0.2–0.9)
NEUTROS ABS: 5.2 10*3/uL (ref 1.4–6.5)
Neutrophil %: 69.5 %
PLATELETS: 440 10*3/uL (ref 150–440)
RBC: 4.53 10*6/uL (ref 3.80–5.20)
RDW: 17.7 % — ABNORMAL HIGH (ref 11.5–14.5)
WBC: 7.4 10*3/uL (ref 3.6–11.0)

## 2014-12-17 LAB — BASIC METABOLIC PANEL
Anion Gap: 9 (ref 7–16)
BUN: 23 mg/dL — ABNORMAL HIGH (ref 7–18)
Calcium, Total: 9 mg/dL (ref 8.5–10.1)
Chloride: 102 mmol/L (ref 98–107)
Co2: 28 mmol/L (ref 21–32)
Creatinine: 0.9 mg/dL (ref 0.60–1.30)
EGFR (African American): >60
EGFR (Non-African Amer.): >60
Glucose: 198 mg/dL — ABNORMAL HIGH (ref 65–99)
Osmolality: 287 (ref 275–301)
Potassium: 4.6 mmol/L (ref 3.5–5.1)
Sodium: 139 mmol/L (ref 136–145)

## 2014-12-17 LAB — PHOSPHORUS: Phosphorus: 4.1 mg/dL (ref 2.5–4.9)

## 2014-12-17 LAB — MAGNESIUM: Magnesium: 1.6 mg/dL — ABNORMAL LOW

## 2014-12-24 LAB — PHOSPHORUS: PHOSPHORUS: 4 mg/dL (ref 2.5–4.9)

## 2014-12-24 LAB — CBC WITH DIFFERENTIAL/PLATELET
BASOS ABS: 0.1 10*3/uL (ref 0.0–0.1)
BASOS PCT: 1.4 %
Eosinophil #: 0.1 10*3/uL (ref 0.0–0.7)
Eosinophil %: 2.2 %
HCT: 35.6 % (ref 35.0–47.0)
HGB: 10.9 g/dL — AB (ref 12.0–16.0)
LYMPHS ABS: 1.2 10*3/uL (ref 1.0–3.6)
Lymphocyte %: 17.6 %
MCH: 24 pg — ABNORMAL LOW (ref 26.0–34.0)
MCHC: 30.7 g/dL — ABNORMAL LOW (ref 32.0–36.0)
MCV: 78 fL — ABNORMAL LOW (ref 80–100)
MONO ABS: 0.7 x10 3/mm (ref 0.2–0.9)
Monocyte %: 10.3 %
NEUTROS ABS: 4.5 10*3/uL (ref 1.4–6.5)
Neutrophil %: 68.5 %
Platelet: 491 10*3/uL — ABNORMAL HIGH (ref 150–440)
RBC: 4.55 10*6/uL (ref 3.80–5.20)
RDW: 17.9 % — ABNORMAL HIGH (ref 11.5–14.5)
WBC: 6.6 10*3/uL (ref 3.6–11.0)

## 2014-12-24 LAB — BASIC METABOLIC PANEL
Anion Gap: 9 (ref 7–16)
BUN: 16 mg/dL (ref 7–18)
CALCIUM: 8.7 mg/dL (ref 8.5–10.1)
CREATININE: 1.03 mg/dL (ref 0.60–1.30)
Chloride: 104 mmol/L (ref 98–107)
Co2: 28 mmol/L (ref 21–32)
EGFR (African American): >60
EGFR (Non-African Amer.): 57 — ABNORMAL LOW
Glucose: 148 mg/dL — ABNORMAL HIGH (ref 65–99)
Osmolality: 285 (ref 275–301)
POTASSIUM: 5 mmol/L (ref 3.5–5.1)
Sodium: 141 mmol/L (ref 136–145)

## 2014-12-24 LAB — MAGNESIUM: Magnesium: 1.9 mg/dL

## 2014-12-25 ENCOUNTER — Encounter: Payer: Self-pay | Admitting: Nephrology

## 2014-12-25 DIAGNOSIS — C801 Malignant (primary) neoplasm, unspecified: Secondary | ICD-10-CM

## 2014-12-25 HISTORY — DX: Malignant (primary) neoplasm, unspecified: C80.1

## 2014-12-28 LAB — CBC WITH DIFFERENTIAL/PLATELET
BASOS ABS: 0.1 10*3/uL (ref 0.0–0.1)
BASOS PCT: 1.2 %
EOS ABS: 0.1 10*3/uL (ref 0.0–0.7)
Eosinophil %: 1.8 %
HCT: 34.7 % — ABNORMAL LOW (ref 35.0–47.0)
HGB: 10.5 g/dL — AB (ref 12.0–16.0)
LYMPHS PCT: 18.9 %
Lymphocyte #: 1.4 10*3/uL (ref 1.0–3.6)
MCH: 23.3 pg — ABNORMAL LOW (ref 26.0–34.0)
MCHC: 30.3 g/dL — ABNORMAL LOW (ref 32.0–36.0)
MCV: 77 fL — ABNORMAL LOW (ref 80–100)
MONO ABS: 0.8 x10 3/mm (ref 0.2–0.9)
Monocyte %: 10.6 %
Neutrophil #: 5 10*3/uL (ref 1.4–6.5)
Neutrophil %: 67.5 %
Platelet: 561 10*3/uL — ABNORMAL HIGH (ref 150–440)
RBC: 4.52 10*6/uL (ref 3.80–5.20)
RDW: 18.1 % — ABNORMAL HIGH (ref 11.5–14.5)
WBC: 7.5 10*3/uL (ref 3.6–11.0)

## 2014-12-28 LAB — BASIC METABOLIC PANEL
Anion Gap: 8 (ref 7–16)
BUN: 13 mg/dL (ref 7–18)
CHLORIDE: 99 mmol/L (ref 98–107)
CREATININE: 1.15 mg/dL (ref 0.60–1.30)
Calcium, Total: 8.6 mg/dL (ref 8.5–10.1)
Co2: 28 mmol/L (ref 21–32)
EGFR (Non-African Amer.): 50 — ABNORMAL LOW
Glucose: 206 mg/dL — ABNORMAL HIGH (ref 65–99)
Osmolality: 276 (ref 275–301)
Potassium: 4.3 mmol/L (ref 3.5–5.1)
Sodium: 135 mmol/L — ABNORMAL LOW (ref 136–145)

## 2014-12-28 LAB — PHOSPHORUS: Phosphorus: 4.1 mg/dL (ref 2.5–4.9)

## 2014-12-28 LAB — MAGNESIUM: Magnesium: 1.9 mg/dL

## 2014-12-31 LAB — CBC WITH DIFFERENTIAL/PLATELET
BASOS PCT: 0 %
Basophil #: 0 10*3/uL (ref 0.0–0.1)
EOS ABS: 0.2 10*3/uL (ref 0.0–0.7)
EOS PCT: 2.3 %
HCT: 35.9 % (ref 35.0–47.0)
HGB: 10.9 g/dL — ABNORMAL LOW (ref 12.0–16.0)
LYMPHS ABS: 1.2 10*3/uL (ref 1.0–3.6)
Lymphocyte %: 15.8 %
MCH: 23.4 pg — AB (ref 26.0–34.0)
MCHC: 30.4 g/dL — AB (ref 32.0–36.0)
MCV: 77 fL — AB (ref 80–100)
MONO ABS: 0.6 x10 3/mm (ref 0.2–0.9)
Monocyte %: 7.4 %
Neutrophil #: 5.8 10*3/uL (ref 1.4–6.5)
Neutrophil %: 74.5 %
PLATELETS: 630 10*3/uL — AB (ref 150–440)
RBC: 4.67 10*6/uL (ref 3.80–5.20)
RDW: 17.8 % — ABNORMAL HIGH (ref 11.5–14.5)
WBC: 7.8 10*3/uL (ref 3.6–11.0)

## 2014-12-31 LAB — BASIC METABOLIC PANEL
Anion Gap: 8 (ref 7–16)
BUN: 17 mg/dL (ref 7–18)
Calcium, Total: 8.7 mg/dL (ref 8.5–10.1)
Chloride: 104 mmol/L (ref 98–107)
Co2: 29 mmol/L (ref 21–32)
Creatinine: 1.08 mg/dL (ref 0.60–1.30)
EGFR (African American): >60
GFR CALC NON AF AMER: 53 — AB
Glucose: 99 mg/dL (ref 65–99)
Osmolality: 283 (ref 275–301)
Potassium: 4.2 mmol/L (ref 3.5–5.1)
SODIUM: 141 mmol/L (ref 136–145)

## 2014-12-31 LAB — PHOSPHORUS: Phosphorus: 3.8 mg/dL (ref 2.5–4.9)

## 2014-12-31 LAB — MAGNESIUM: Magnesium: 2 mg/dL

## 2015-01-04 LAB — BASIC METABOLIC PANEL
ANION GAP: 9 (ref 7–16)
BUN: 16 mg/dL (ref 7–18)
CO2: 26 mmol/L (ref 21–32)
Calcium, Total: 9 mg/dL (ref 8.5–10.1)
Chloride: 104 mmol/L (ref 98–107)
Creatinine: 1.07 mg/dL (ref 0.60–1.30)
EGFR (Non-African Amer.): 54 — ABNORMAL LOW
Glucose: 155 mg/dL — ABNORMAL HIGH (ref 65–99)
OSMOLALITY: 282 (ref 275–301)
Potassium: 4.5 mmol/L (ref 3.5–5.1)
Sodium: 139 mmol/L (ref 136–145)

## 2015-01-04 LAB — CBC WITH DIFFERENTIAL/PLATELET
BASOS PCT: 1.9 %
Basophil #: 0.1 10*3/uL (ref 0.0–0.1)
Eosinophil #: 0.1 10*3/uL (ref 0.0–0.7)
Eosinophil %: 2.1 %
HCT: 35.9 % (ref 35.0–47.0)
HGB: 11 g/dL — AB (ref 12.0–16.0)
LYMPHS PCT: 16.8 %
Lymphocyte #: 1.1 10*3/uL (ref 1.0–3.6)
MCH: 23.2 pg — AB (ref 26.0–34.0)
MCHC: 30.6 g/dL — ABNORMAL LOW (ref 32.0–36.0)
MCV: 76 fL — ABNORMAL LOW (ref 80–100)
MONOS PCT: 10.3 %
Monocyte #: 0.7 x10 3/mm (ref 0.2–0.9)
Neutrophil #: 4.7 10*3/uL (ref 1.4–6.5)
Neutrophil %: 68.9 %
Platelet: 541 10*3/uL — ABNORMAL HIGH (ref 150–440)
RBC: 4.73 10*6/uL (ref 3.80–5.20)
RDW: 18.4 % — ABNORMAL HIGH (ref 11.5–14.5)
WBC: 6.8 10*3/uL (ref 3.6–11.0)

## 2015-01-04 LAB — PHOSPHORUS: Phosphorus: 3.9 mg/dL (ref 2.5–4.9)

## 2015-01-04 LAB — MAGNESIUM: Magnesium: 1.7 mg/dL — ABNORMAL LOW

## 2015-01-08 LAB — CBC WITH DIFFERENTIAL/PLATELET
Basophil #: 0.1 10*3/uL (ref 0.0–0.1)
Basophil %: 1.3 %
Eosinophil #: 0.1 10*3/uL (ref 0.0–0.7)
Eosinophil %: 2.1 %
HCT: 34.8 % — ABNORMAL LOW (ref 35.0–47.0)
HGB: 10.5 g/dL — ABNORMAL LOW (ref 12.0–16.0)
LYMPHS ABS: 1.1 10*3/uL (ref 1.0–3.6)
Lymphocyte %: 16.5 %
MCH: 23.1 pg — ABNORMAL LOW (ref 26.0–34.0)
MCHC: 30.2 g/dL — ABNORMAL LOW (ref 32.0–36.0)
MCV: 76 fL — AB (ref 80–100)
Monocyte #: 0.6 x10 3/mm (ref 0.2–0.9)
Monocyte %: 9.4 %
Neutrophil #: 4.6 10*3/uL (ref 1.4–6.5)
Neutrophil %: 70.7 %
PLATELETS: 518 10*3/uL — AB (ref 150–440)
RBC: 4.56 10*6/uL (ref 3.80–5.20)
RDW: 18.4 % — ABNORMAL HIGH (ref 11.5–14.5)
WBC: 6.5 10*3/uL (ref 3.6–11.0)

## 2015-01-08 LAB — BASIC METABOLIC PANEL
Anion Gap: 7 (ref 7–16)
BUN: 15 mg/dL (ref 7–18)
CHLORIDE: 107 mmol/L (ref 98–107)
Calcium, Total: 8.8 mg/dL (ref 8.5–10.1)
Co2: 27 mmol/L (ref 21–32)
Creatinine: 1.13 mg/dL (ref 0.60–1.30)
EGFR (Non-African Amer.): 51 — ABNORMAL LOW
Glucose: 111 mg/dL — ABNORMAL HIGH (ref 65–99)
Osmolality: 283 (ref 275–301)
Potassium: 4.4 mmol/L (ref 3.5–5.1)
SODIUM: 141 mmol/L (ref 136–145)

## 2015-01-08 LAB — PHOSPHORUS: PHOSPHORUS: 4.1 mg/dL (ref 2.5–4.9)

## 2015-01-08 LAB — MAGNESIUM: MAGNESIUM: 1.6 mg/dL — AB

## 2015-01-11 LAB — CBC WITH DIFFERENTIAL/PLATELET
BASOS ABS: 0.1 10*3/uL (ref 0.0–0.1)
Basophil %: 0.6 %
EOS ABS: 0.2 10*3/uL (ref 0.0–0.7)
Eosinophil %: 2.1 %
HCT: 35.5 % (ref 35.0–47.0)
HGB: 10.6 g/dL — ABNORMAL LOW (ref 12.0–16.0)
LYMPHS PCT: 12.7 %
Lymphocyte #: 1.1 10*3/uL (ref 1.0–3.6)
MCH: 22.7 pg — ABNORMAL LOW (ref 26.0–34.0)
MCHC: 29.7 g/dL — AB (ref 32.0–36.0)
MCV: 77 fL — ABNORMAL LOW (ref 80–100)
Monocyte #: 0.7 x10 3/mm (ref 0.2–0.9)
Monocyte %: 7.5 %
NEUTROS PCT: 77.1 %
Neutrophil #: 6.9 10*3/uL — ABNORMAL HIGH (ref 1.4–6.5)
Platelet: 523 10*3/uL — ABNORMAL HIGH (ref 150–440)
RBC: 4.65 10*6/uL (ref 3.80–5.20)
RDW: 18.6 % — AB (ref 11.5–14.5)
WBC: 8.9 10*3/uL (ref 3.6–11.0)

## 2015-01-11 LAB — PHOSPHORUS: PHOSPHORUS: 3.7 mg/dL (ref 2.5–4.9)

## 2015-01-11 LAB — BASIC METABOLIC PANEL
ANION GAP: 10 (ref 7–16)
BUN: 19 mg/dL — ABNORMAL HIGH (ref 7–18)
CHLORIDE: 104 mmol/L (ref 98–107)
CO2: 27 mmol/L (ref 21–32)
Calcium, Total: 8.7 mg/dL (ref 8.5–10.1)
Creatinine: 1.14 mg/dL (ref 0.60–1.30)
EGFR (African American): >60
EGFR (Non-African Amer.): 50 — ABNORMAL LOW
Glucose: 167 mg/dL — ABNORMAL HIGH (ref 65–99)
Osmolality: 287 (ref 275–301)
Potassium: 4.2 mmol/L (ref 3.5–5.1)
SODIUM: 141 mmol/L (ref 136–145)

## 2015-01-11 LAB — MAGNESIUM: MAGNESIUM: 1.4 mg/dL — AB

## 2015-01-14 LAB — MAGNESIUM: MAGNESIUM: 1.4 mg/dL — AB

## 2015-01-14 LAB — BASIC METABOLIC PANEL
ANION GAP: 8 (ref 7–16)
BUN: 19 mg/dL — ABNORMAL HIGH (ref 7–18)
Calcium, Total: 8.7 mg/dL (ref 8.5–10.1)
Chloride: 104 mmol/L (ref 98–107)
Co2: 27 mmol/L (ref 21–32)
Creatinine: 1.16 mg/dL (ref 0.60–1.30)
GFR CALC AF AMER: 60 — AB
GFR CALC NON AF AMER: 49 — AB
Glucose: 170 mg/dL — ABNORMAL HIGH (ref 65–99)
OSMOLALITY: 284 (ref 275–301)
Potassium: 4.3 mmol/L (ref 3.5–5.1)
Sodium: 139 mmol/L (ref 136–145)

## 2015-01-14 LAB — CBC WITH DIFFERENTIAL/PLATELET
BASOS PCT: 0.9 %
Basophil #: 0.1 10*3/uL (ref 0.0–0.1)
EOS PCT: 2 %
Eosinophil #: 0.1 10*3/uL (ref 0.0–0.7)
HCT: 34.7 % — ABNORMAL LOW (ref 35.0–47.0)
HGB: 10.7 g/dL — AB (ref 12.0–16.0)
LYMPHS PCT: 18.9 %
Lymphocyte #: 1.3 10*3/uL (ref 1.0–3.6)
MCH: 23 pg — AB (ref 26.0–34.0)
MCHC: 30.9 g/dL — ABNORMAL LOW (ref 32.0–36.0)
MCV: 75 fL — ABNORMAL LOW (ref 80–100)
Monocyte #: 0.8 x10 3/mm (ref 0.2–0.9)
Monocyte %: 11.3 %
NEUTROS PCT: 66.9 %
Neutrophil #: 4.6 10*3/uL (ref 1.4–6.5)
PLATELETS: 570 10*3/uL — AB (ref 150–440)
RBC: 4.65 10*6/uL (ref 3.80–5.20)
RDW: 18.1 % — ABNORMAL HIGH (ref 11.5–14.5)
WBC: 6.9 10*3/uL (ref 3.6–11.0)

## 2015-01-14 LAB — PHOSPHORUS: Phosphorus: 4.1 mg/dL (ref 2.5–4.9)

## 2015-01-18 LAB — CBC WITH DIFFERENTIAL/PLATELET
BASOS ABS: 0 10*3/uL (ref 0.0–0.1)
Basophil %: 0.5 %
Eosinophil #: 0.1 10*3/uL (ref 0.0–0.7)
Eosinophil %: 1.8 %
HCT: 33.7 % — ABNORMAL LOW (ref 35.0–47.0)
HGB: 10.3 g/dL — AB (ref 12.0–16.0)
LYMPHS PCT: 13.1 %
Lymphocyte #: 1.1 10*3/uL (ref 1.0–3.6)
MCH: 22.9 pg — ABNORMAL LOW (ref 26.0–34.0)
MCHC: 30.7 g/dL — ABNORMAL LOW (ref 32.0–36.0)
MCV: 75 fL — ABNORMAL LOW (ref 80–100)
Monocyte #: 0.8 x10 3/mm (ref 0.2–0.9)
Monocyte %: 10.3 %
Neutrophil #: 6 10*3/uL (ref 1.4–6.5)
Neutrophil %: 74.3 %
Platelet: 568 10*3/uL — ABNORMAL HIGH (ref 150–440)
RBC: 4.52 10*6/uL (ref 3.80–5.20)
RDW: 18.8 % — ABNORMAL HIGH (ref 11.5–14.5)
WBC: 8.1 10*3/uL (ref 3.6–11.0)

## 2015-01-18 LAB — BASIC METABOLIC PANEL
Anion Gap: 6 — ABNORMAL LOW (ref 7–16)
BUN: 18 mg/dL (ref 7–18)
CO2: 27 mmol/L (ref 21–32)
CREATININE: 1.13 mg/dL (ref 0.60–1.30)
Calcium, Total: 8.7 mg/dL (ref 8.5–10.1)
Chloride: 103 mmol/L (ref 98–107)
EGFR (African American): >60
EGFR (Non-African Amer.): 51 — ABNORMAL LOW
Glucose: 129 mg/dL — ABNORMAL HIGH (ref 65–99)
Osmolality: 276 (ref 275–301)
Potassium: 4.4 mmol/L (ref 3.5–5.1)
Sodium: 136 mmol/L (ref 136–145)

## 2015-01-18 LAB — MAGNESIUM: Magnesium: 1.5 mg/dL — ABNORMAL LOW

## 2015-01-18 LAB — PHOSPHORUS: PHOSPHORUS: 3.7 mg/dL (ref 2.5–4.9)

## 2015-01-21 LAB — BASIC METABOLIC PANEL
Anion Gap: 8 (ref 7–16)
BUN: 14 mg/dL (ref 7–18)
CALCIUM: 8.5 mg/dL (ref 8.5–10.1)
CREATININE: 1.11 mg/dL (ref 0.60–1.30)
Chloride: 103 mmol/L (ref 98–107)
Co2: 26 mmol/L (ref 21–32)
EGFR (Non-African Amer.): 52 — ABNORMAL LOW
GLUCOSE: 187 mg/dL — AB (ref 65–99)
Osmolality: 279 (ref 275–301)
POTASSIUM: 4.7 mmol/L (ref 3.5–5.1)
SODIUM: 137 mmol/L (ref 136–145)

## 2015-01-21 LAB — CBC WITH DIFFERENTIAL/PLATELET
BASOS PCT: 0.5 %
Basophil #: 0 10*3/uL (ref 0.0–0.1)
EOS ABS: 0.2 10*3/uL (ref 0.0–0.7)
Eosinophil %: 2 %
HCT: 34.3 % — ABNORMAL LOW (ref 35.0–47.0)
HGB: 10.3 g/dL — ABNORMAL LOW (ref 12.0–16.0)
LYMPHS PCT: 14.1 %
Lymphocyte #: 1.1 10*3/uL (ref 1.0–3.6)
MCH: 22.5 pg — AB (ref 26.0–34.0)
MCHC: 30.2 g/dL — AB (ref 32.0–36.0)
MCV: 75 fL — ABNORMAL LOW (ref 80–100)
MONO ABS: 0.8 x10 3/mm (ref 0.2–0.9)
Monocyte %: 9.7 %
Neutrophil #: 5.8 10*3/uL (ref 1.4–6.5)
Neutrophil %: 73.7 %
PLATELETS: 554 10*3/uL — AB (ref 150–440)
RBC: 4.6 10*6/uL (ref 3.80–5.20)
RDW: 18.3 % — ABNORMAL HIGH (ref 11.5–14.5)
WBC: 7.8 10*3/uL (ref 3.6–11.0)

## 2015-01-21 LAB — MAGNESIUM: Magnesium: 1.6 mg/dL — ABNORMAL LOW

## 2015-01-21 LAB — PHOSPHORUS: PHOSPHORUS: 3.4 mg/dL (ref 2.5–4.9)

## 2015-01-25 ENCOUNTER — Encounter: Payer: Self-pay | Admitting: Nephrology

## 2015-01-27 ENCOUNTER — Ambulatory Visit: Payer: Self-pay | Admitting: Internal Medicine

## 2015-03-29 ENCOUNTER — Encounter
Admit: 2015-03-29 | Disposition: A | Discharge status: Home / Self Care | Payer: Self-pay | Attending: Nephrology | Admitting: Nephrology

## 2015-03-29 ENCOUNTER — Emergency Department
Admit: 2015-03-29 | Disposition: A | Discharge status: Home / Self Care | Payer: Self-pay | Admitting: Emergency Medicine

## 2015-03-29 LAB — BASIC METABOLIC PANEL
ANION GAP: 8 (ref 7–16)
BUN: 10 mg/dL
CALCIUM: 9.1 mg/dL
CO2: 26 mmol/L
Chloride: 104 mmol/L
Creatinine: 0.8 mg/dL
EGFR (African American): >60
EGFR (Non-African Amer.): >60
GLUCOSE: 187 mg/dL — AB
Potassium: 4.5 mmol/L
Sodium: 138 mmol/L

## 2015-03-29 LAB — CBC WITH DIFFERENTIAL/PLATELET
Basophil #: 0 10*3/uL (ref 0.0–0.1)
Basophil %: 0.4 %
Eosinophil #: 0.2 10*3/uL (ref 0.0–0.7)
Eosinophil %: 1.8 %
HCT: 38.7 % (ref 35.0–47.0)
HGB: 12 g/dL (ref 12.0–16.0)
Lymphocyte #: 0.9 10*3/uL — ABNORMAL LOW (ref 1.0–3.6)
Lymphocyte %: 10.4 %
MCH: 22.1 pg — AB (ref 26.0–34.0)
MCHC: 31 g/dL — ABNORMAL LOW (ref 32.0–36.0)
MCV: 71 fL — AB (ref 80–100)
MONO ABS: 1 x10 3/mm — AB (ref 0.2–0.9)
MONOS PCT: 12.3 %
NEUTROS ABS: 6.3 10*3/uL (ref 1.4–6.5)
Neutrophil %: 75.1 %
Platelet: 624 10*3/uL — ABNORMAL HIGH (ref 150–440)
RBC: 5.43 10*6/uL — ABNORMAL HIGH (ref 3.80–5.20)
RDW: 25 % — AB (ref 11.5–14.5)
WBC: 8.4 10*3/uL (ref 3.6–11.0)

## 2015-03-29 LAB — PHOSPHORUS: Phosphorus: 4 mg/dL

## 2015-03-29 LAB — MAGNESIUM: Magnesium: 1.6 mg/dL — ABNORMAL LOW

## 2015-05-13 ENCOUNTER — Other Ambulatory Visit
Admission: RE | Admit: 2015-05-13 | Discharge: 2015-05-13 | Disposition: A | Discharge status: Home / Self Care | Payer: Medicare Other | Source: Ambulatory Visit | Attending: Nephrology | Admitting: Nephrology

## 2015-05-13 DIAGNOSIS — N390 Urinary tract infection, site not specified: Secondary | ICD-10-CM | POA: Insufficient documentation

## 2015-05-13 DIAGNOSIS — Z09 Encounter for follow-up examination after completed treatment for conditions other than malignant neoplasm: Secondary | ICD-10-CM | POA: Insufficient documentation

## 2015-05-13 DIAGNOSIS — E559 Vitamin D deficiency, unspecified: Secondary | ICD-10-CM | POA: Insufficient documentation

## 2015-05-13 DIAGNOSIS — Z114 Encounter for screening for human immunodeficiency virus [HIV]: Secondary | ICD-10-CM | POA: Diagnosis present

## 2015-05-13 DIAGNOSIS — D631 Anemia in chronic kidney disease: Secondary | ICD-10-CM | POA: Diagnosis not present

## 2015-05-13 DIAGNOSIS — Z940 Kidney transplant status: Secondary | ICD-10-CM | POA: Insufficient documentation

## 2015-05-13 DIAGNOSIS — Z79899 Other long term (current) drug therapy: Secondary | ICD-10-CM | POA: Insufficient documentation

## 2015-05-13 DIAGNOSIS — E1129 Type 2 diabetes mellitus with other diabetic kidney complication: Secondary | ICD-10-CM | POA: Insufficient documentation

## 2015-05-13 DIAGNOSIS — Z789 Other specified health status: Secondary | ICD-10-CM | POA: Diagnosis not present

## 2015-05-13 LAB — CBC WITH DIFFERENTIAL/PLATELET
BASOS ABS: 0.1 10*3/uL (ref 0–0.1)
BASOS PCT: 2 %
Eosinophils Absolute: 0.1 10*3/uL (ref 0–0.7)
Eosinophils Relative: 1 %
HCT: 38 % (ref 35.0–47.0)
HEMOGLOBIN: 12 g/dL (ref 12.0–16.0)
LYMPHS ABS: 0.9 10*3/uL — AB (ref 1.0–3.6)
Lymphocytes Relative: 10 %
MCH: 21.6 pg — ABNORMAL LOW (ref 26.0–34.0)
MCHC: 31.5 g/dL — ABNORMAL LOW (ref 32.0–36.0)
MCV: 68.4 fL — ABNORMAL LOW (ref 80.0–100.0)
MONOS PCT: 12 %
Monocytes Absolute: 1 10*3/uL — ABNORMAL HIGH (ref 0.2–0.9)
NEUTROS PCT: 75 %
Neutro Abs: 6.4 10*3/uL (ref 1.4–6.5)
Platelets: 594 10*3/uL — ABNORMAL HIGH (ref 150–440)
RBC: 5.55 MIL/uL — AB (ref 3.80–5.20)
RDW: 22.5 % — ABNORMAL HIGH (ref 11.5–14.5)
WBC: 8.5 10*3/uL (ref 3.6–11.0)

## 2015-05-13 LAB — BASIC METABOLIC PANEL
Anion gap: 12 (ref 5–15)
BUN: 15 mg/dL (ref 6–20)
CALCIUM: 9.1 mg/dL (ref 8.9–10.3)
CO2: 26 mmol/L (ref 22–32)
CREATININE: 0.79 mg/dL (ref 0.44–1.00)
Chloride: 102 mmol/L (ref 101–111)
GFR calc Af Amer: >60 mL/min (ref >60)
GFR calc non Af Amer: >60 mL/min (ref >60)
GLUCOSE: 98 mg/dL (ref 65–99)
Potassium: 3.5 mmol/L (ref 3.5–5.1)
Sodium: 140 mmol/L (ref 135–145)

## 2015-05-13 LAB — IRON AND TIBC
Iron: 16 ug/dL — ABNORMAL LOW (ref 28–170)
Saturation Ratios: 4 % — ABNORMAL LOW (ref 10.4–31.8)
TIBC: 368 ug/dL (ref 250–450)
UIBC: 352 ug/dL

## 2015-05-13 LAB — LIPID PANEL
Cholesterol: 150 mg/dL (ref 0–200)
HDL: 59 mg/dL (ref >40)
LDL Cholesterol: 58 mg/dL (ref 0–99)
TRIGLYCERIDES: 165 mg/dL — AB (ref <150)
Total CHOL/HDL Ratio: 2.5 RATIO
VLDL: 33 mg/dL (ref 0–40)

## 2015-05-13 LAB — MAGNESIUM: Magnesium: 1.7 mg/dL (ref 1.7–2.4)

## 2015-05-13 LAB — ALT: ALT: 22 U/L (ref 14–54)

## 2015-05-13 LAB — GAMMA GT: GGT: 28 U/L (ref 7–50)

## 2015-05-13 LAB — AST: AST: 22 U/L (ref 15–41)

## 2015-05-13 LAB — PHOSPHORUS: PHOSPHORUS: 3.9 mg/dL (ref 2.5–4.6)

## 2015-05-13 LAB — ALKALINE PHOSPHATASE: Alkaline Phosphatase: 76 U/L (ref 38–126)

## 2015-05-13 LAB — ALBUMIN: Albumin: 3.6 g/dL (ref 3.5–5.0)

## 2015-05-13 LAB — BILIRUBIN, TOTAL: BILIRUBIN TOTAL: 0.6 mg/dL (ref 0.3–1.2)

## 2015-05-13 LAB — FERRITIN: FERRITIN: 9 ng/mL — AB (ref 11–307)

## 2015-05-31 DIAGNOSIS — Z85828 Personal history of other malignant neoplasm of skin: Secondary | ICD-10-CM | POA: Insufficient documentation

## 2015-06-28 ENCOUNTER — Encounter: Payer: Self-pay | Admitting: Emergency Medicine

## 2015-06-28 ENCOUNTER — Emergency Department
Admission: EM | Admit: 2015-06-28 | Discharge: 2015-06-28 | Disposition: A | Discharge status: Home / Self Care | Payer: Medicare Other | Attending: Emergency Medicine | Admitting: Emergency Medicine

## 2015-06-28 DIAGNOSIS — Z87891 Personal history of nicotine dependence: Secondary | ICD-10-CM | POA: Diagnosis not present

## 2015-06-28 DIAGNOSIS — S29012A Strain of muscle and tendon of back wall of thorax, initial encounter: Secondary | ICD-10-CM | POA: Diagnosis not present

## 2015-06-28 DIAGNOSIS — S299XXA Unspecified injury of thorax, initial encounter: Secondary | ICD-10-CM | POA: Diagnosis present

## 2015-06-28 DIAGNOSIS — Y9289 Other specified places as the place of occurrence of the external cause: Secondary | ICD-10-CM | POA: Diagnosis not present

## 2015-06-28 DIAGNOSIS — Y998 Other external cause status: Secondary | ICD-10-CM | POA: Insufficient documentation

## 2015-06-28 DIAGNOSIS — X58XXXA Exposure to other specified factors, initial encounter: Secondary | ICD-10-CM | POA: Diagnosis not present

## 2015-06-28 DIAGNOSIS — Z880 Allergy status to penicillin: Secondary | ICD-10-CM | POA: Diagnosis not present

## 2015-06-28 DIAGNOSIS — Y9389 Activity, other specified: Secondary | ICD-10-CM | POA: Diagnosis not present

## 2015-06-28 HISTORY — DX: Disorder of kidney and ureter, unspecified: N28.9

## 2015-06-28 MED ORDER — ORPHENADRINE CITRATE 30 MG/ML IJ SOLN
INTRAMUSCULAR | Status: AC
  Administered 2015-06-28: 60 mg via INTRAMUSCULAR
  Filled 2015-06-28: qty 2

## 2015-06-28 MED ORDER — METHOCARBAMOL 750 MG PO TABS: 1500.0000 mg | ORAL_TABLET | Freq: Four times a day (QID) | ORAL | Status: DC

## 2015-06-28 MED ORDER — OXYCODONE-ACETAMINOPHEN 5-325 MG PO TABS: 1.0000 | ORAL_TABLET | Freq: Four times a day (QID) | ORAL | Status: DC | PRN

## 2015-06-28 MED ORDER — HYDROMORPHONE HCL 1 MG/ML IJ SOLN
1.0000 mg | Freq: Once | INTRAMUSCULAR | Status: AC
Start: 2015-06-28 — End: 2015-06-28
  Administered 2015-06-28: 1 mg via INTRAMUSCULAR

## 2015-06-28 MED ORDER — ORPHENADRINE CITRATE 30 MG/ML IJ SOLN
60.0000 mg | Freq: Two times a day (BID) | INTRAMUSCULAR | Status: DC
  Administered 2015-06-28: 60 mg via INTRAMUSCULAR

## 2015-06-28 MED ORDER — ONDANSETRON 4 MG PO TBDP: 4.0000 mg | ORAL_TABLET | Freq: Two times a day (BID) | ORAL | Status: AC

## 2015-06-28 MED ORDER — HYDROMORPHONE HCL 1 MG/ML IJ SOLN
INTRAMUSCULAR | Status: AC
  Administered 2015-06-28: 1 mg via INTRAMUSCULAR
  Filled 2015-06-28: qty 1

## 2015-06-28 NOTE — ED Notes (Signed)
Pt states she has had mid back pain for a week now, denies urinary symptoms, states she thinks its a muscle strain. 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

## 2015-06-28 NOTE — ED Notes (Signed)
States she developed left sided mid back pain after going to the gym.  Denies any other injury. No relief with OTC meds/ice

## 2015-06-28 NOTE — ED Notes (Signed)
Reviewed patients d/c medications - she reports that she is can not take percocet because it makes her vomit.  She is requesting a different medication

## 2015-06-28 NOTE — ED Notes (Signed)
Discussed patients concern with Ron, patients provider about the patients concern with taking percocet - that is causes nausea and vomiting.   The provider said that pt is unable to take NSAIDS and he would like her to take the tylenol that is in the percocet because she needs the anti-inflammatory.   He prescribed zofran to combat the n/v  That comes from the percocet.   This was discussed with pt and she was in agreement.

## 2015-06-28 NOTE — ED Provider Notes (Signed)
[facility] Emergency Department Provider Note  ____________________________________________  Time seen: Approximately 11:50 AM  I have reviewed the triage vital signs and the nursing notes.   HISTORY  Chief Complaint Back Pain    HPI Cindy Fischer is a 69 y.o. female patient complaining of 10 days of left-sided mid back pain after working out in the gym. Instead occurred 10 days ago has been refractory to conservative management. Patient rates the pain as 8/10 which increases with abduction of the left upper extremity. Patient denies any loss of strength or sensation to the back or upper extremities.   Past Medical History  Diagnosis Date  . Renal disorder     There are no active problems to display for this patient.   Past Surgical History  Procedure Laterality Date  . Nephrectomy transplanted organ    . Tubal ligation    . Tonsillectomy    . Insertion of dialysis catheter      No current outpatient prescriptions on file.  Allergies Penicillins  No family history on file.  Social History History  Substance Use Topics  . Smoking status: Former Research scientist (life sciences)  . Smokeless tobacco: Not on file  . Alcohol Use: No    Review of Systems Constitutional: No fever/chills Eyes: No visual changes. ENT: No sore throat. Cardiovascular: Denies chest pain. Respiratory: Denies shortness of breath. Gastrointestinal: No abdominal pain.  No nausea, no vomiting.  No diarrhea.  No constipation. Genitourinary: Negative for dysuria. Musculoskeletal: Mid left lateral back pain. Skin: Negative for rash. Neurological: Negative for headaches, focal weakness or numbness. 10-point ROS otherwise negative.  ____________________________________________   PHYSICAL EXAM:  VITAL SIGNS: ED Triage Vitals  Enc Vitals Group     BP 06/28/15 1120 162/71 mmHg     Pulse Rate 06/28/15 1116 78     Resp 06/28/15 1116 18     Temp 06/28/15 1116 98.5 F (36.9 C)     Temp  Source 06/28/15 1116 Oral     SpO2 06/28/15 1116 98 %     Weight 06/28/15 1116 173 lb 9.6 oz (78.744 kg)     Height 06/28/15 1116 5\' 4"  (1.626 m)     Head Cir --      Peak Flow --      Pain Score 06/28/15 1117 8     Pain Loc --      Pain Edu? --      Excl. in [place]? --    Constitutional: Alert and oriented. Well appearing and in no acute distress. Eyes: Conjunctivae are normal. PERRL. EOMI. Head: Atraumatic. Nose: No congestion/rhinnorhea. Mouth/Throat: Mucous membranes are moist.  Oropharynx non-erythematous. Neck: No stridor. No cervical spine tenderness to palpation Hematological/Lymphatic/Immunilogical:  No cervical lymphadenopathy. Cardiovascular: Normal rate, regular rhythm. Grossly normal heart sounds.  Good peripheral circulation. Elevated BP Respiratory: Normal respiratory effort.  No retractions. Lungs CTAB. Gastrointestinal: Soft and nontender. No distention. No abdominal bruits. No CVA tenderness. Musculoskeletal: No obvious spinal deformity no CVA guarding. Patient is tender palpation inferior left scapular area. Muscle spasm with abduction of the left upper extremity. Patient has full nuchal range of motion strength is 5 over 5 she is neurovascular intact. Neurologic:  Normal speech and language. No gross focal neurologic deficits are appreciated. Speech is normal. No gait instability. Skin:  Skin is warm, dry and intact. No rash noted. Psychiatric: Mood and affect are normal. Speech and behavior are normal.  ____________________________________________   LABS (all labs ordered are listed, but only abnormal results are  displayed)  Labs Reviewed - No data to display ____________________________________________  EKG   ____________________________________________  RADIOLOGY   ____________________________________________   PROCEDURES  Procedure(s) performed: None  Critical Care performed: No  ____________________________________________   INITIAL  IMPRESSION / ASSESSMENT AND PLAN / ED COURSE  Pertinent labs & imaging results that were available during my care of the patient were reviewed by me and considered in my medical decision making (see chart for details).  Left scapular muscle strain. Patient given injection of Norflex and Dilaudid. Patient is discharged oxycodone and Robaxin. He is advised follow-up family doctor in 3-5 days if she does no improvement return to ER if condition worsens. ____________________________________________   FINAL CLINICAL IMPRESSION(S) / ED DIAGNOSES  Final diagnoses:  Strain of thoracic paraspinal muscles excluding T1 and T2 levels, initial encounter      Sable Feil, PA-C 06/28/15 [place], PA-C 06/28/15 1259  Lavonia Drafts, MD 06/28/15 [phone]

## 2015-07-22 DIAGNOSIS — D849 Immunodeficiency, unspecified: Secondary | ICD-10-CM | POA: Insufficient documentation

## 2015-08-23 ENCOUNTER — Other Ambulatory Visit
Admission: RE | Admit: 2015-08-23 | Discharge: 2015-08-23 | Disposition: A | Discharge status: Home / Self Care | Payer: Medicare Other | Source: Ambulatory Visit | Attending: Nephrology | Admitting: Nephrology

## 2015-08-23 DIAGNOSIS — Z79899 Other long term (current) drug therapy: Secondary | ICD-10-CM | POA: Insufficient documentation

## 2015-08-23 DIAGNOSIS — D631 Anemia in chronic kidney disease: Secondary | ICD-10-CM | POA: Insufficient documentation

## 2015-08-23 DIAGNOSIS — E1129 Type 2 diabetes mellitus with other diabetic kidney complication: Secondary | ICD-10-CM | POA: Diagnosis not present

## 2015-08-23 DIAGNOSIS — Z114 Encounter for screening for human immunodeficiency virus [HIV]: Secondary | ICD-10-CM | POA: Diagnosis not present

## 2015-08-23 DIAGNOSIS — Z789 Other specified health status: Secondary | ICD-10-CM | POA: Diagnosis not present

## 2015-08-23 DIAGNOSIS — E559 Vitamin D deficiency, unspecified: Secondary | ICD-10-CM | POA: Insufficient documentation

## 2015-08-23 DIAGNOSIS — Z940 Kidney transplant status: Secondary | ICD-10-CM | POA: Insufficient documentation

## 2015-08-23 DIAGNOSIS — N390 Urinary tract infection, site not specified: Secondary | ICD-10-CM | POA: Insufficient documentation

## 2015-08-23 LAB — CBC WITH DIFFERENTIAL/PLATELET
BAND NEUTROPHILS: 4 % (ref 0–10)
Basophils Absolute: 0.1 10*3/uL (ref 0.0–0.1)
Basophils Relative: 1 % (ref 0–1)
EOS ABS: 0 10*3/uL (ref 0.0–0.7)
Eosinophils Relative: 0 % (ref 0–5)
HCT: 46.3 % (ref 35.0–47.0)
HEMOGLOBIN: 14.5 g/dL (ref 12.0–16.0)
LYMPHS PCT: 15 % (ref 12–46)
Lymphs Abs: 1.8 10*3/uL (ref 0.7–4.0)
MCH: 22.6 pg — ABNORMAL LOW (ref 26.0–34.0)
MCHC: 31.4 g/dL — AB (ref 32.0–36.0)
MCV: 72 fL — ABNORMAL LOW (ref 80.0–100.0)
MYELOCYTES: 2 %
Metamyelocytes Relative: 1 %
Monocytes Absolute: 1.1 10*3/uL — ABNORMAL HIGH (ref 0.1–1.0)
Monocytes Relative: 9 % (ref 3–12)
NEUTROS PCT: 68 % (ref 43–77)
Neutro Abs: 8.7 10*3/uL — ABNORMAL HIGH (ref 1.7–7.7)
PLATELETS: 594 10*3/uL — AB (ref 150–440)
RBC: 6.43 MIL/uL — ABNORMAL HIGH (ref 3.80–5.20)
RDW: 28.4 % — ABNORMAL HIGH (ref 11.5–14.5)
WBC: 11.7 10*3/uL — AB (ref 3.6–11.0)

## 2015-08-23 LAB — BASIC METABOLIC PANEL
Anion gap: 10 (ref 5–15)
BUN: 14 mg/dL (ref 6–20)
CHLORIDE: 100 mmol/L — AB (ref 101–111)
CO2: 30 mmol/L (ref 22–32)
Calcium: 9.5 mg/dL (ref 8.9–10.3)
Creatinine, Ser: 1.06 mg/dL — ABNORMAL HIGH (ref 0.44–1.00)
GFR calc non Af Amer: 52 mL/min — ABNORMAL LOW (ref >60)
Glucose, Bld: 96 mg/dL (ref 65–99)
Potassium: 4 mmol/L (ref 3.5–5.1)
SODIUM: 140 mmol/L (ref 135–145)

## 2015-08-23 LAB — MAGNESIUM: MAGNESIUM: 1.8 mg/dL (ref 1.7–2.4)

## 2015-08-23 LAB — PHOSPHORUS: Phosphorus: 3.8 mg/dL (ref 2.5–4.6)

## 2015-08-24 LAB — PATHOLOGIST SMEAR REVIEW

## 2015-08-26 DIAGNOSIS — N63 Unspecified lump in unspecified breast: Secondary | ICD-10-CM | POA: Insufficient documentation

## 2015-09-24 DIAGNOSIS — D473 Essential (hemorrhagic) thrombocythemia: Secondary | ICD-10-CM | POA: Insufficient documentation

## 2015-09-24 DIAGNOSIS — D751 Secondary polycythemia: Secondary | ICD-10-CM | POA: Insufficient documentation

## 2015-09-24 DIAGNOSIS — D75839 Thrombocytosis, unspecified: Secondary | ICD-10-CM | POA: Insufficient documentation

## 2015-10-15 ENCOUNTER — Other Ambulatory Visit
Admission: RE | Admit: 2015-10-15 | Discharge: 2015-10-15 | Disposition: A | Discharge status: Home / Self Care | Payer: Medicare Other | Source: Ambulatory Visit | Attending: Nephrology | Admitting: Nephrology

## 2015-10-15 DIAGNOSIS — T8610 Unspecified complication of kidney transplant: Secondary | ICD-10-CM | POA: Diagnosis not present

## 2015-10-15 DIAGNOSIS — N390 Urinary tract infection, site not specified: Secondary | ICD-10-CM | POA: Insufficient documentation

## 2015-10-15 DIAGNOSIS — Z789 Other specified health status: Secondary | ICD-10-CM | POA: Insufficient documentation

## 2015-10-15 DIAGNOSIS — D631 Anemia in chronic kidney disease: Secondary | ICD-10-CM | POA: Diagnosis not present

## 2015-10-15 DIAGNOSIS — Z79899 Other long term (current) drug therapy: Secondary | ICD-10-CM | POA: Insufficient documentation

## 2015-10-15 DIAGNOSIS — Z114 Encounter for screening for human immunodeficiency virus [HIV]: Secondary | ICD-10-CM | POA: Diagnosis not present

## 2015-10-15 DIAGNOSIS — N189 Chronic kidney disease, unspecified: Secondary | ICD-10-CM | POA: Insufficient documentation

## 2015-10-15 DIAGNOSIS — E559 Vitamin D deficiency, unspecified: Secondary | ICD-10-CM | POA: Diagnosis not present

## 2015-10-15 DIAGNOSIS — E1129 Type 2 diabetes mellitus with other diabetic kidney complication: Secondary | ICD-10-CM | POA: Insufficient documentation

## 2015-10-15 DIAGNOSIS — Z940 Kidney transplant status: Secondary | ICD-10-CM | POA: Diagnosis present

## 2015-10-15 LAB — BASIC METABOLIC PANEL
ANION GAP: 9 (ref 5–15)
BUN: 13 mg/dL (ref 6–20)
CALCIUM: 9.1 mg/dL (ref 8.9–10.3)
CO2: 26 mmol/L (ref 22–32)
Chloride: 102 mmol/L (ref 101–111)
Creatinine, Ser: 0.82 mg/dL (ref 0.44–1.00)
GLUCOSE: 100 mg/dL — AB (ref 65–99)
Potassium: 4 mmol/L (ref 3.5–5.1)
SODIUM: 137 mmol/L (ref 135–145)

## 2015-10-15 LAB — CBC WITH DIFFERENTIAL/PLATELET
Basophils Absolute: 0.1 10*3/uL (ref 0–0.1)
Basophils Relative: 1 %
EOS ABS: 0.1 10*3/uL (ref 0–0.7)
HCT: 51.7 % — ABNORMAL HIGH (ref 35.0–47.0)
Hemoglobin: 16.6 g/dL — ABNORMAL HIGH (ref 12.0–16.0)
LYMPHS ABS: 1.8 10*3/uL (ref 1.0–3.6)
Lymphocytes Relative: 20 %
MCH: 24.4 pg — AB (ref 26.0–34.0)
MCHC: 32.1 g/dL (ref 32.0–36.0)
MCV: 76.2 fL — ABNORMAL LOW (ref 80.0–100.0)
Monocytes Absolute: 1.4 10*3/uL — ABNORMAL HIGH (ref 0.2–0.9)
Monocytes Relative: 15 %
Neutro Abs: 5.8 10*3/uL (ref 1.4–6.5)
Neutrophils Relative %: 62 %
PLATELETS: 507 10*3/uL — AB (ref 150–440)
RBC: 6.78 MIL/uL — AB (ref 3.80–5.20)
RDW: 22.1 % — ABNORMAL HIGH (ref 11.5–14.5)
WBC: 9.3 10*3/uL (ref 3.6–11.0)

## 2015-10-15 LAB — PHOSPHORUS: PHOSPHORUS: 3.5 mg/dL (ref 2.5–4.6)

## 2015-10-15 LAB — MAGNESIUM: MAGNESIUM: 1.9 mg/dL (ref 1.7–2.4)

## 2015-10-29 ENCOUNTER — Ambulatory Visit: Payer: Medicare Other

## 2015-10-29 ENCOUNTER — Encounter: Payer: Self-pay | Admitting: Emergency Medicine

## 2015-10-29 ENCOUNTER — Ambulatory Visit
Admission: EM | Admit: 2015-10-29 | Discharge: 2015-10-29 | Disposition: A | Discharge status: Home / Self Care | Payer: Medicare Other | Attending: Family Medicine | Admitting: Family Medicine

## 2015-10-29 DIAGNOSIS — Z880 Allergy status to penicillin: Secondary | ICD-10-CM | POA: Diagnosis not present

## 2015-10-29 DIAGNOSIS — R3129 Other microscopic hematuria: Secondary | ICD-10-CM | POA: Diagnosis not present

## 2015-10-29 DIAGNOSIS — Z940 Kidney transplant status: Secondary | ICD-10-CM | POA: Diagnosis not present

## 2015-10-29 DIAGNOSIS — Z87891 Personal history of nicotine dependence: Secondary | ICD-10-CM | POA: Diagnosis not present

## 2015-10-29 DIAGNOSIS — Z79899 Other long term (current) drug therapy: Secondary | ICD-10-CM | POA: Diagnosis not present

## 2015-10-29 DIAGNOSIS — N200 Calculus of kidney: Secondary | ICD-10-CM

## 2015-10-29 DIAGNOSIS — M549 Dorsalgia, unspecified: Secondary | ICD-10-CM | POA: Diagnosis present

## 2015-10-29 LAB — URINALYSIS COMPLETE WITH MICROSCOPIC (ARMC ONLY)
BILIRUBIN URINE: NEGATIVE
GLUCOSE, UA: NEGATIVE mg/dL
KETONES UR: NEGATIVE mg/dL
Leukocytes, UA: NEGATIVE
Nitrite: NEGATIVE
PROTEIN: NEGATIVE mg/dL
Specific Gravity, Urine: 1.015 (ref 1.005–1.030)
pH: 6.5 (ref 5.0–8.0)

## 2015-10-29 MED ORDER — OXYCODONE HCL 5 MG PO TABS: 5.0000 mg | ORAL_TABLET | ORAL | Status: AC | PRN

## 2015-10-29 MED ORDER — ACETAMINOPHEN 500 MG PO TABS: 1000.0000 mg | ORAL_TABLET | Freq: Four times a day (QID) | ORAL | Status: AC | PRN

## 2015-10-29 MED ORDER — TAMSULOSIN HCL 0.4 MG PO CAPS: 0.4000 mg | ORAL_CAPSULE | Freq: Every day | ORAL | Status: AC

## 2015-10-29 NOTE — ED Notes (Signed)
Back pain afterworking

## 2015-10-29 NOTE — ED Provider Notes (Signed)
CSN: [id number]     Arrival date & time 10/29/15  [id number] History   First MD Initiated Contact with Patient 10/29/15 1022     Chief Complaint  Patient presents with  . Back Pain   (Consider location/radiation/quality/duration/timing/severity/associated sxs/prior Treatment) HPI Comments: Single caucasian female here for evaluation of left back pain--constant ache.  Has transplanted kidney on right typically her back pain right sided.  Stopped going to gym last week to do house and yard work trimming bushes.  I think I overdid it.  Doesn't feel like the back pain I had in July which was tight.  Denied muscle spasms  Some end urination discomfort x 24 hours denied frequency or gross hematuria, changes in smell or color.   Percocet not good for me makes me vomit.  Right hand dominant.  Most comfortable position standing.  Has been taking tylenol 1000mg  po TID prn helping a little but not much x 3 days.  Fingersticks have been good at home 108 this am  Heat didn't help hasn't tried ice.  Denied loss of bowel, bladder control, saddle paresthesias or arm/leg weakness.  Refused sedating or injectible pain medications in clinic.  PMHx: diabetic  PSHx tonsillectomy 1958 Tubal ligation 1985 Dialysis access x 5 (2007-2015) kidney transplant 2015  The history is provided by the patient.    Past Medical History  Diagnosis Date  . Renal disorder    Past Surgical History  Procedure Laterality Date  . Nephrectomy transplanted organ    . Tubal ligation    . Tonsillectomy    . Insertion of dialysis catheter     History reviewed. No pertinent family history. Social History  Substance Use Topics  . Smoking status: Former Research scientist (life sciences)  . Smokeless tobacco: None  . Alcohol Use: No   OB History    No data available     Review of Systems  Constitutional: Negative for fever, chills, diaphoresis, activity change, appetite change and fatigue.  HENT: Negative for congestion, dental problem, drooling, ear discharge,  ear pain, sore throat, trouble swallowing and voice change.   Eyes: Negative for photophobia, pain, discharge, redness, itching and visual disturbance.  Respiratory: Negative for cough, choking, chest tightness, shortness of breath, wheezing and stridor.   Cardiovascular: Negative for chest pain and leg swelling.  Gastrointestinal: Negative for nausea, vomiting, abdominal pain, diarrhea, constipation, blood in stool and abdominal distention.  Endocrine: Negative for cold intolerance and heat intolerance.  Genitourinary: Positive for dysuria. Negative for urgency, frequency, hematuria, flank pain, decreased urine volume, vaginal bleeding, vaginal discharge, enuresis, difficulty urinating, genital sores, vaginal pain, menstrual problem and pelvic pain.  Musculoskeletal: Positive for back pain. Negative for myalgias, joint swelling, arthralgias, gait problem, neck pain and neck stiffness.  Skin: Negative for color change, pallor, rash and wound.  Allergic/Immunologic: Positive for immunocompromised state. Negative for environmental allergies and food allergies.  Neurological: Negative for dizziness, tremors, seizures, syncope, facial asymmetry, speech difficulty, weakness, light-headedness, numbness and headaches.  Hematological: Negative for adenopathy. Does not bruise/bleed easily.  Psychiatric/Behavioral: Negative for confusion, sleep disturbance and agitation. The patient is not nervous/anxious.     Allergies  Penicillins  Home Medications   Prior to Admission medications   Medication Sig Start Date End Date Taking? Authorizing Provider  acetaminophen (TYLENOL) 500 MG tablet Take 2 tablets (1,000 mg total) by mouth every 6 (six) hours as needed for mild pain or moderate pain. 10/29/15 11/01/15  Olen Cordial, NP  oxyCODONE (OXY IR/ROXICODONE) 5 MG immediate release tablet  Take 1 tablet (5 mg total) by mouth every 4 (four) hours as needed for severe pain. 10/29/15 11/01/15  Olen Cordial,  NP  tamsulosin (FLOMAX) 0.4 MG CAPS capsule Take 1 capsule (0.4 mg total) by mouth daily. 10/29/15 11/05/15  Olen Cordial, NP   Meds Ordered and Administered this Visit  Medications - No data to display  BP 158/82 mmHg  Pulse 70  Temp(Src) 97.5 F (36.4 C) (Tympanic)  Resp 20  Ht 5' 3.5" (1.613 m)  Wt 173 lb (78.472 kg)  BMI 30.16 kg/m2  SpO2 99% No data found.   Physical Exam  Constitutional: She is oriented to person, place, and time. She appears well-developed and well-nourished. She is active and cooperative.  Non-toxic appearance. She does not have a sickly appearance. She does not appear ill. No distress.  HENT:  Head: Normocephalic and atraumatic.  Right Ear: Hearing, tympanic membrane, external ear and ear canal normal.  Left Ear: Hearing, tympanic membrane, external ear and ear canal normal.  Nose: No mucosal edema, rhinorrhea, nose lacerations, sinus tenderness, nasal deformity, septal deviation or nasal septal hematoma. No epistaxis.  No foreign bodies. Right sinus exhibits no maxillary sinus tenderness and no frontal sinus tenderness. Left sinus exhibits no maxillary sinus tenderness and no frontal sinus tenderness.  Mouth/Throat: Uvula is midline and mucous membranes are normal. Mucous membranes are not pale, not dry and not cyanotic. She does not have dentures. No oral lesions. No trismus in the jaw. Normal dentition. No dental abscesses, uvula swelling, lacerations or dental caries. No oropharyngeal exudate, posterior oropharyngeal edema, posterior oropharyngeal erythema or tonsillar abscesses.  Eyes: Conjunctivae and EOM are normal. Pupils are equal, round, and reactive to light. Right eye exhibits no chemosis, no discharge, no exudate and no hordeolum. No foreign body present in the right eye. Left eye exhibits no chemosis, no discharge, no exudate and no hordeolum. No foreign body present in the left eye. Right conjunctiva is not injected. Right conjunctiva has no  hemorrhage. Left conjunctiva is not injected. Left conjunctiva has no hemorrhage. No scleral icterus. Right eye exhibits normal extraocular motion and no nystagmus. Left eye exhibits normal extraocular motion and no nystagmus. Right pupil is round and reactive. Left pupil is round and reactive. Pupils are equal.  Neck: Trachea normal and normal range of motion. Neck supple. No tracheal tenderness, no spinous process tenderness and no muscular tenderness present. No rigidity. No tracheal deviation, no edema, no erythema and normal range of motion present. No thyroid mass and no thyromegaly present.  Cardiovascular: Normal rate, regular rhythm, S1 normal, S2 normal, normal heart sounds and intact distal pulses.  PMI is not displaced.  Exam reveals no gallop and no friction rub.   No murmur heard. Pulmonary/Chest: Effort normal and breath sounds normal. No accessory muscle usage or stridor. No respiratory distress. She has no decreased breath sounds. She has no wheezes. She has no rhonchi. She has no rales. She exhibits no tenderness.  Abdominal: Soft. Bowel sounds are normal. She exhibits no shifting dullness, no distension, no pulsatile liver, no fluid wave, no abdominal bruit, no ascites, no pulsatile midline mass and no mass. There is no hepatosplenomegaly. There is no tenderness. There is no rigidity, no rebound, no guarding, no CVA tenderness, no tenderness at McBurney's point and negative Murphy's sign. Hernia confirmed negative in the ventral area.  Dull to percussion x 4 quads  Musculoskeletal: Normal range of motion. She exhibits no edema or tenderness.  Right shoulder: Normal.       Left shoulder: Normal.       Right hip: Normal.       Left hip: Normal.       Right knee: Normal.       Left knee: Normal.       Right ankle: Normal.       Left ankle: Normal.       Cervical back: Normal.       Thoracic back: She exhibits pain. She exhibits normal range of motion, no tenderness, no bony  tenderness, no swelling, no edema, no deformity, no laceration, no spasm and normal pulse.       Lumbar back: She exhibits pain. She exhibits normal range of motion, no tenderness, no bony tenderness, no swelling, no edema, no deformity, no laceration, no spasm and normal pulse.       Back:       Right hand: Normal.       Left hand: Normal.  Pain with extension; left rotation; bilateral lateral bending right greater than left; able to touch toes without worsening of pain; heel/toe walk; movement on/off exam table without difficulty; DTRs patellar and ankle 1+ equal bilaterally  Lymphadenopathy:       Head (right side): No submental, no submandibular, no tonsillar, no preauricular, no posterior auricular and no occipital adenopathy present.       Head (left side): No submental, no submandibular, no tonsillar, no preauricular, no posterior auricular and no occipital adenopathy present.    She has no cervical adenopathy.       Right cervical: No superficial cervical, no deep cervical and no posterior cervical adenopathy present.      Left cervical: No superficial cervical, no deep cervical and no posterior cervical adenopathy present.  Neurological: She is alert and oriented to person, place, and time. She has normal strength. She displays no atrophy, no tremor and normal reflexes. No cranial nerve deficit or sensory deficit. She exhibits normal muscle tone. She displays no seizure activity. Coordination and gait normal. GCS eye subscore is 4. GCS verbal subscore is 5. GCS motor subscore is 6.  Reflex Scores:      Patellar reflexes are 1+ on the right side and 1+ on the left side.      Achilles reflexes are 1+ on the right side and 1+ on the left side. Skin: Skin is warm, dry and intact. No abrasion, no bruising, no burn, no ecchymosis, no laceration, no lesion, no petechiae and no rash noted. She is not diaphoretic. No cyanosis or erythema. No pallor. Nails show no clubbing.  Psychiatric: She has a  normal mood and affect. Her speech is normal and behavior is normal. Judgment and thought content normal. Cognition and memory are normal.  Nursing note and vitals reviewed.   ED Course  Procedures (including critical care time)  Labs Review Labs Reviewed  URINALYSIS COMPLETEWITH MICROSCOPIC ([place]) - Abnormal; Notable for the following:    Hgb urine dipstick 1+ (*)    Squamous Epithelial / LPF 0-5 (*)    All other components within normal limits  URINE CULTURE    Imaging Review Dg Abd 1 View  10/29/2015  CLINICAL DATA:  Hematuria and back pain EXAM: ABDOMEN - 1 VIEW COMPARISON:  None. FINDINGS: Normal bowel gas pattern.  Negative for bowel obstruction. Aortoiliac calcification. Surgical clips in the right lower quadrant. Multiple small calcifications overlying the right kidney which could be related to small kidney stones. There may  also be some vascular calcification in the renal arteries bilaterally. 5 mm calcification overlying the left kidney could be vascular. Disc degeneration at L3-4 on the left.  No acute bony abnormality IMPRESSION: Bilateral small renal calcifications right greater left. Some of these may be vascular. There may be nephrolithiasis on the right. CT would be better able to evaluate these calcifications Atherosclerotic aorta and iliac arteries. Electronically Signed   By: Franchot Gallo M.D.   On: 10/29/2015 11:43    1100 urinalysis results pending.  Ice pack ordered.  Reviewed [place] Controlled Substances website  Patient reported last narcotics July 2016 ER provider.  Plain oxycodone works best for her pain per patient not percocet or oxycodone with tylenol.  06/28/2015 03/29/2015 OXYCODONE- ACETAMINOPHEN 5- 325 [id number] 12 3 0 0 [id number] [place] , [place] [id number] [place], [place], [place] [birth year] [address] [place], [place] [address] 04 0 12/21/2014 12/21/2014 OXYCODONE HCL 5 MG TABLET [id number] 40 5 0 0  [id number] [facility] [place], [place] [id number] [employer] [facility] [place], [place] 1946-03-29 [address] [place], [place] [address] [phone] Discussed urinalysis results with patient. 1+ blood will obtain KUB check for kidney stones. Ice pack to back helped some along with lying on exam table on top of ice pack  Discussed dehydration/hard work out, UTI, cysitis, nephrolithiasis could be cause of blood in urine.  UTI not likely as no bacteria seen in sample. Patient agreed with plan of care and had no further questions at this time  1150 Discussed KUB results with patient.  To start flomax po daily, continue tylenol 1000mg  po QID prn pain, oxycodone 5mg  po BID prn breakthrough pain tylenol and strain urine.  Keep follow up appt with nephrology [facility] Dr Kerby Moors.  Given copy of report and image disk.  Follow up for re-evaluation this weekend if worsening pain, unable to urinate every 8 hours, changes in urine color/smell, vomiting, lethargy.  Discussed with patient CT scan not available at our location today but recommended if worsening symptoms at ER of her choice.  Patient verbalized understanding of information/instruction, agreed with plan of care and had no further questions at this time.  MDM   1. Hematuria, microscopic   2. Nephrolithiasis    New microscopic hematuria follow up with nephrology for re-evaluation as already scheduled.  Discussed KUB results and urinalysis results.  Has appt with nephrology pending 9 Nov.  Received disk with images to take with her to appt.  Will call with urine culture results once avaialble typcially 48 hours.  Follow up sooner if worsening back pain.  Rx for oxycodone 5mg  po BID prn severe pain. Tylenol 1000mg  po QID prn pain.  Strain all urine.  Hydrate, hydrate.   Flomax 0.4mg  daily as directed.   Patient is also to push fluids and strain urine. Call or return to clinic as needed if these symptoms worsen or fail to improve as  anticipated e.g. gross hematuria, fever, worsening pain, unable to void.  Exitcare handout on nephrolithiasis and microscopic hematuria given to patient.  Differential diagnosis: urethritis, cystitis, dehydration, back strain.  For acute pain, rest, and intermittent application of heat (do not sleep on heating pad).  I discussed longer-term treatment plan of PRN PO NSAIDS and I discussed a home back care exercise program with a strengthening and flexibility exercise.  Patient given Exitcare handout on lumbago.  Proper avoidance of heavy lifting discussed.  Consider  physical therapy or chiropractic care and radiology if not improving.  Call or return to clinic as needed if these symptoms worsen or fail to improve as anticipated especially leg weakness, loss of bowel/bladder control or saddle paresthesias.   Trial ice for back pain along with gentle stretching  Patient verbalized agreement and understanding of treatment plan and had no further questions at this time. P2:  Hydrate, cranberry juice  Olen Cordial, NP 10/30/15 1514

## 2015-10-29 NOTE — Discharge Instructions (Signed)
Dietary Guidelines to Help Prevent Kidney Stones °Your risk of kidney stones can be decreased by adjusting the foods you eat. The most important thing you can do is drink enough fluid. You should drink enough fluid to keep your urine clear or pale yellow. The following guidelines provide specific information for the type of kidney stone you have had. °GUIDELINES ACCORDING TO TYPE OF KIDNEY STONE °Calcium Oxalate Kidney Stones °· Reduce the amount of salt you eat. Foods that have a lot of salt cause your body to release excess calcium into your urine. The excess calcium can combine with a substance called oxalate to form kidney stones. °· Reduce the amount of animal protein you eat if the amount you eat is excessive. Animal protein causes your body to release excess calcium into your urine. Ask your dietitian how much protein from animal sources you should be eating. °· Avoid foods that are high in oxalates. If you take vitamins, they should have less than 500 mg of vitamin C. Your body turns vitamin C into oxalates. You do not need to avoid fruits and vegetables high in vitamin C. °Calcium Phosphate Kidney Stones °· Reduce the amount of salt you eat to help prevent the release of excess calcium into your urine. °· Reduce the amount of animal protein you eat if the amount you eat is excessive. Animal protein causes your body to release excess calcium into your urine. Ask your dietitian how much protein from animal sources you should be eating. °· Get enough calcium from food or take a calcium supplement (ask your dietitian for recommendations). Food sources of calcium that do not increase your risk of kidney stones include: °· Broccoli. °· Dairy products, such as cheese and yogurt. °· Pudding. °Uric Acid Kidney Stones °· Do not have more than 6 oz of animal protein per day. °FOOD SOURCES °Animal Protein Sources °· Meat (all types). °· Poultry. °· Eggs. °· Fish, seafood. °Foods High in Salt °· Salt seasonings. °· Soy  sauce. °· Teriyaki sauce. °· Cured and processed meats. °· Salted crackers and snack foods. °· Fast food. °· Canned soups and most canned foods. °Foods High in Oxalates °· Grains: °· Amaranth. °· Barley. °· Grits. °· Wheat germ. °· Bran. °· Buckwheat flour. °· All bran cereals. °· Pretzels. °· Whole wheat bread. °· Vegetables: °· Beans (wax). °· Beets and beet greens. °· Collard greens. °· Eggplant. °· Escarole. °· Leeks. °· Okra. °· Parsley. °· Rutabagas. °· Spinach. °· Swiss chard. °· Tomato paste. °· Fried potatoes. °· Sweet potatoes. °· Fruits: °· Red currants. °· Figs. °· Kiwi. °· Rhubarb. °· Meat and Other Protein Sources: °· Beans (dried). °· Soy burgers and other soybean products. °· Miso. °· Nuts (peanuts, almonds, pecans, cashews, hazelnuts). °· Nut butters. °· Sesame seeds and tahini (paste made of sesame seeds). °· Poppy seeds. °· Beverages: °· Chocolate drink mixes. °· Soy milk. °· Instant iced tea. °· Juices made from high-oxalate fruits or vegetables. °· Other: °· Carob. °· Chocolate. °· Fruitcake. °· Marmalades. °  °This information is not intended to replace advice given to you by your health care provider. Make sure you discuss any questions you have with your health care provider. °  °Document Released: 04/07/2011 Document Revised: 12/16/2013 Document Reviewed: 11/07/2013 °Elsevier Interactive Patient Education ©2016 Elsevier Inc[employer] ° °Kidney Stones °Kidney stones (urolithiasis) are deposits that form inside your kidneys. The intense pain is caused by the stone moving through the urinary tract. When the stone moves, the ureter   goes into spasm around the stone. The stone is usually passed in the urine.  CAUSES   A disorder that makes certain neck glands produce too much parathyroid hormone (primary hyperparathyroidism).  A buildup of uric acid crystals, similar to gout in your joints.  Narrowing (stricture) of the ureter.  A kidney obstruction present at birth (congenital  obstruction).  Previous surgery on the kidney or ureters.  Numerous kidney infections. SYMPTOMS   Feeling sick to your stomach (nauseous).  Throwing up (vomiting).  Blood in the urine (hematuria).  Pain that usually spreads (radiates) to the groin.  Frequency or urgency of urination. DIAGNOSIS   Taking a history and physical exam.  Blood or urine tests.  CT scan.  Occasionally, an examination of the inside of the urinary bladder (cystoscopy) is performed. TREATMENT   Observation.  Increasing your fluid intake.  Extracorporeal shock wave lithotripsy--This is a noninvasive procedure that uses shock waves to break up kidney stones.  Surgery may be needed if you have severe pain or persistent obstruction. There are various surgical procedures. Most of the procedures are performed with the use of small instruments. Only small incisions are needed to accommodate these instruments, so recovery time is minimized. The size, location, and chemical composition are all important variables that will determine the proper choice of action for you. Talk to your health care provider to better understand your situation so that you will minimize the risk of injury to yourself and your kidney.  HOME CARE INSTRUCTIONS   Drink enough water and fluids to keep your urine clear or pale yellow. This will help you to pass the stone or stone fragments.  Strain all urine through the provided strainer. Keep all particulate matter and stones for your health care provider to see. The stone causing the pain may be as small as a grain of salt. It is very important to use the strainer each and every time you pass your urine. The collection of your stone will allow your health care provider to analyze it and verify that a stone has actually passed. The stone analysis will often identify what you can do to reduce the incidence of recurrences.  Only take over-the-counter or prescription medicines for pain,  discomfort, or fever as directed by your health care provider.  Keep all follow-up visits as told by your health care provider. This is important.  Get follow-up X-rays if required. The absence of pain does not always mean that the stone has passed. It may have only stopped moving. If the urine remains completely obstructed, it can cause loss of kidney function or even complete destruction of the kidney. It is your responsibility to make sure X-rays and follow-ups are completed. Ultrasounds of the kidney can show blockages and the status of the kidney. Ultrasounds are not associated with any radiation and can be performed easily in a matter of minutes.  Make changes to your daily diet as told by your health care provider. You may be told to:  Limit the amount of salt that you eat.  Eat 5 or more servings of fruits and vegetables each day.  Limit the amount of meat, poultry, fish, and eggs that you eat.  Collect a 24-hour urine sample as told by your health care provider.You may need to collect another urine sample every 6-12 months. SEEK MEDICAL CARE IF:  You experience pain that is progressive and unresponsive to any pain medicine you have been prescribed. SEEK IMMEDIATE MEDICAL CARE IF:   Pain  cannot be controlled with the prescribed medicine.  You have a fever or shaking chills.  The severity or intensity of pain increases over 18 hours and is not relieved by pain medicine.  You develop a new onset of abdominal pain.  You feel faint or pass out.  You are unable to urinate.   This information is not intended to replace advice given to you by your health care provider. Make sure you discuss any questions you have with your health care provider.   Document Released: 12/11/2005 Document Revised: 09/01/2015 Document Reviewed: 05/14/2013 Elsevier Interactive Patient Education 2016 Elsevier Inc. Hematuria, Adult Hematuria is blood in your urine. It can be caused by a bladder  infection, kidney infection, prostate infection, kidney stone, or cancer of your urinary tract. Infections can usually be treated with medicine, and a kidney stone usually will pass through your urine. If neither of these is the cause of your hematuria, further workup to find out the reason may be needed. It is very important that you tell your health care provider about any blood you see in your urine, even if the blood stops without treatment or happens without causing pain. Blood in your urine that happens and then stops and then happens again can be a symptom of a very serious condition. Also, pain is not a symptom in the initial stages of many urinary cancers. HOME CARE INSTRUCTIONS   Drink lots of fluid, 3-4 quarts a day. If you have been diagnosed with an infection, cranberry juice is especially recommended, in addition to large amounts of water.  Avoid caffeine, tea, and carbonated beverages because they tend to irritate the bladder.  Avoid alcohol because it may irritate the prostate.  Take all medicines as directed by your health care provider.  If you were prescribed an antibiotic medicine, finish it all even if you start to feel better.  If you have been diagnosed with a kidney stone, follow your health care provider's instructions regarding straining your urine to catch the stone.  Empty your bladder often. Avoid holding urine for long periods of time.  After a bowel movement, women should cleanse front to back. Use each tissue only once.  Empty your bladder before and after sexual intercourse if you are a female. SEEK MEDICAL CARE IF:  You develop back pain.  You have a fever.  You have a feeling of sickness in your stomach (nausea) or vomiting.  Your symptoms are not better in 3 days. Return sooner if you are getting worse. SEEK IMMEDIATE MEDICAL CARE IF:   You develop severe vomiting and are unable to keep the medicine down.  You develop severe back or abdominal pain  despite taking your medicines.  You begin passing a large amount of blood or clots in your urine.  You feel extremely weak or faint, or you pass out. MAKE SURE YOU:   Understand these instructions.  Will watch your condition.  Will get help right away if you are not doing well or get worse.   This information is not intended to replace advice given to you by your health care provider. Make sure you discuss any questions you have with your health care provider.   Document Released: 12/11/2005 Document Revised: 01/01/2015 Document Reviewed: 08/11/2013 Elsevier Interactive Patient Education [employer].

## 2015-10-31 LAB — URINE CULTURE: Special Requests: NORMAL

## 2015-11-01 NOTE — ED Notes (Signed)
Final report of urine shows multiple species, report consist ant w contaminated sample

## 2015-11-09 NOTE — Unmapped (Signed)
Heidi Blankenship:     11/08/2015   Heidi Blankenship  [id number]      Seen by pharmacy for New Onset Diabetes After Transplant education and medication management    Heidi Blankenship is a 69 y.o. 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 Type II diabetes mellitus ([facility])   ??? Hyperlipidemia   ??? 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 Aftercare following organ transplant   ??? Hypothyroidism   ??? Quit smoking within past year   ??? Immunocompromised state ([facility])   ??? Diabetes mellitus type 2 in obese ([facility])   ??? History of nonmelanoma skin cancer   ??? Immunosuppression ([facility])   ??? Breast swelling   ??? Thrombocytosis ([facility])   ??? Erythrocytosis   . Induction agent: thymoglobulin    Past Medical History   Diagnosis Date   ??? Diabetes mellitus ([facility]CC)    ??? ESRD (end stage renal disease) on dialysis [facility])        Social History     Social History   ??? Marital Status: Widowed     Spouse Name: N/A   ??? Number of Children: N/A   ??? Years of Education: N/A     Occupational History   ??? Not on file.     Social History Main Topics   ??? Smoking status: Former Smoker -- 0.50 packs/day for 29 years   ??? Smokeless tobacco: Not on file      Comment: Pt intermittently smoked throughout years since 69 yo, quit intermittently throughout the years up to 5 years at a time   ??? Alcohol Use: No   ??? Drug Use: No   ??? Sexual Activity: Not on file     Other Topics Concern   ??? Do You Use Sunscreen? Yes   ??? Tanning Bed Use? No   ??? Are You Easily Burned? No   ??? Excessive Sun Exposure? No   ??? Blistering Sunburns? Yes     Social History Narrative       LMP  (LMP Unknown)  Wt Readings from Last 3 Encounters:   11/03/15 79.47 kg (175 lb 3.2 oz)   09/22/15 80.854 kg (178 lb 4 oz)   09/22/15 80.922 kg (178 lb 6.4 oz)     Temp Readings from Last 3 Encounters:   11/03/15 36.7 ??C (98.1 ??F) Tympanic   09/22/15 35.6 ??C (96.1 ??F) Tympanic   09/22/15 35.6 ??C (96.1 ??F) Tympanic     BP Readings from Last 3 Encounters:   11/03/15 130/78   09/22/15 158/72   09/22/15 158/72     Pulse Readings from Last 3 Encounters:   11/03/15 72   09/22/15 60   09/22/15 60       All medications reviewed and updated.     Medication list includes revisions made during today???s encounter.     Outpatient Encounter Prescriptions as of 11/03/2015   Medication Sig Dispense Refill   ??? aspirin (ADULT LOW DOSE ASPIRIN) 81 MG tablet Take 81 mg by mouth daily.     ??? atorvastatin (LIPITOR) 20 MG tablet Take 1 tablet (20 mg total) by mouth daily. 90 tablet 4   ??? insulin NPH (HUMULIN N) 100 unit/mL injection Inject 30 units in the AM and 6 units in the PM. E 13.9 20 mL 6   ??? insulin regular (HUMULIN,NOVOLIN) 100 unit/mL injection Inject 3 units 15 minutes  before bk, 10 U before lunch, and 6 U before dinner. E 13.9 10 mL 6   ??? insulin syringe-needle U-100 (INSULIN SYRINGE) 1/2 mL 30 gauge x 5/16 Syrg Inject insulin 3-5 times per day as needed. E 13.9 150 each 6   ??? levothyroxine (SYNTHROID, LEVOTHROID) 112 MCG tablet Take 1 tablet (112 mcg total) by mouth daily at 0600. 90 tablet 3   ??? PROGRAF 0.5 mg capsule Take 2-3 capsules (1-1.5 mg total) by mouth Two (2) times a day. Take 1.5mg  am and 1mg  PM .DAW1; tx date 07/21/04 Z94.0 60 capsule 11   ??? STUDY [id number] everolimus 0.75 mg Tab tablet Take 1  0.25 mg tablets and 1 0.75 mg tablet twice per day. For total of 1 mg BID  0   ??? [DISCONTINUED] ferrous sulfate 325 (65 FE) MG EC tablet Take 1 tablet (325 mg total) by mouth Three (3) times a day with a meal. 90 tablet 11   ??? [DISCONTINUED] oxyCODONE-acetaminophen (PERCOCET) 10-650 mg per tablet Take 1 tablet by mouth every six (6) hours as needed for pain. 30 tablet 0   ??? [DISCONTINUED] oxyCODONE-acetaminophen (PERCOCET) 10-650 mg per tablet Take 1 tablet by mouth every six (6) hours as needed for pain. 30 tablet 0   ??? [DISCONTINUED] oxyCODONE-acetaminophen (PERCOCET) 5-325 mg per tablet Take 1 tablet by mouth every four (4) hours as needed for pain. 30 tablet 0   ??? [DISCONTINUED] predniSONE (DELTASONE) 5 MG tablet Take 1 tablet (5 mg total) by mouth daily. 30 tablet 11     No facility-administered encounter medications on file as of 11/03/2015.       Allergies   Allergen Reactions   ??? Penicillins      Other reaction(s): RASH   ??? Percocet [Oxycodone-Acetaminophen] Nausea And Vomiting         Labs: No components found for: HBA1C    BGM:    GLUCOSE, POC   Date/Time Value Ref Range Status   05/11/2015 02:53 PM 191* 65-179 mg/dL Final   21/30/8657 84:69 AM 183* 65-179 mg/dL Final   62/95/2841 32:44 AM 143 65 - 179 mg/dL Final   12/27/7251 66:44 PM 154 65 - 179 mg/dL Final   03/47/4259 56:38 AM 215* 65 - 179 mg/dL Final   75/64/3329 51:88 AM 142 65 - 179 mg/dL Final   41/66/0630 16:01 PM 230* 65 - 179 mg/dL Final   09/32/3557 32:20 PM 202* 65 - 179 mg/dL Final   25/42/7062 37:62 AM 283* 65 - 179 mg/dL Final   83/15/1761 60:73 AM 140 65 - 179 mg/dL Final   71/05/2693 85:46 PM 252* 65 - 179 mg/dL Final   27/02/5008 38:18 PM 197* 65 - 179 mg/dL Final   29/93/7169 67:89 AM 156 65 - 179 mg/dL Final   38/09/1750 02:58 AM 128 65 - 179 mg/dL Final   52/77/8242 35:36 PM 174 65 - 179 mg/dL Final   14/43/1540 08:67 PM 215* 65 - 179 mg/dL Final   61/95/0932 67:12 AM 185* 65 - 179 mg/dL Final   45/80/9983 38:25 AM 140 65 - 179 mg/dL Final   05/39/7673 41:93 PM 154 65 - 179 mg/dL Final   79/01/4096 35:32 PM 152 65 - 179 mg/dL Final   99/24/2683 41:96 AM 170 65 - 179 mg/dL Final   22/29/7989 21:19 AM 149 65 - 179 mg/dL Final   41/74/0814 48:18 PM 119 65 - 179 mg/dL Final   56/31/4970 26:37 PM 121 65 - 179 mg/dL Final   85/88/5027  12:06 PM 165 65 - 179 mg/dL Final   16/09/9603 54:09 AM 165 65 - 179 mg/dL Final   81/19/1478 29:56 PM 185* 65 - 179 mg/dL Final   21/30/8657 84:69 PM 173 65 - 179 mg/dL Final   62/95/2841 32:44 AM 190* 65 - 179 mg/dL Final   12/27/7251 66:44 AM 198* 65 - 179 mg/dL Final   03/47/4259 56:38 PM 113 65 - 179 mg/dL Final 75/64/3329 51:88 PM 123 65 - 179 mg/dL Final   41/66/0630 16:01 AM 165 65 - 179 mg/dL Final   09/32/3557 32:20 AM 186* 65 - 179 mg/dL Final   25/42/7062 37:62 PM 150 65 - 179 mg/dL Final   83/15/1761 60:73 AM 107 65 - 179 mg/dL Final   71/05/2693 85:46 PM 110 65 - 179 mg/dL Final   27/02/5008 38:18 PM 95 65 - 179 mg/dL Final   29/93/7169 67:89 PM 93 65 - 179 mg/dL Final   38/09/1750 02:58 AM 145 65 - 179 MG/DL Final     Breakfast Lunch  Dinner  HS   AC PC AC PC AC PC    97  108    70   98  166  186  129   74  166  134  188   105  173  101  86   105  92  130  127   120  121    155   78  169  216   185   88  127  114  101   119  72  157  147   108  132  153  115   167  134  153  171   111  127  134  184   87  198  186  213    67  233  260  239   71  161  70  109   102  173  139  199   121  158  157  112   133  275  194  88   88  292  104  72   115   177  133  108   89  234  215  152   138  158  93  278   111  225  152  105   82  98  127  109   145  257  138  199   108  184     112   108  111  98  88   121  251  143  72   112  213    108   131  120  141  152   82           Fasting (n= 31 ) :  range 67 - 167 ,  avg 106 ,  SD 23   AC lunch (n= 30 ) :  range 72 - 292 ,  avg 170 ,  SD 57   AC supper (n= 26 ) :  range 70 - 260 ,  avg 147 ,  SD 43   HS (n= 30 ) :  range 70 - 278 ,  avg 139 ,  SD 53   Overall (n= 117 ) :  range 67 - 292 ,  avg 140 ,  SD 51         Wt Readings from Last 1 Encounters:   11/03/15 79.47 kg (175 lb 3.2 oz)  Current DM Control: good control  Current status: stable    Current DM Medications:     Oral medications: None    Insulin: Humulin N 30 U with breakfast and 6 U at bedtime    Humulin R 3 U with breakfast, 10 U with lunch, and 6 U with dinner.    Checks blood sugars QID AC & HS  AM fasting BS :  Pre lunch BS :   Pre dinner BS :  Bedtime BS :     Hypoglycemic events:Yes (twice at dinner time)  Reason for Hypoglycemia: increased activity  Discussed how to treat hypoglycemia: juice (4 oz)    Recommendations:no change    Education included:  - Explanation of new-onset diabetes post-transplant  - Risks of DM (cardiovascular, renal, ophthalmic, neuropathy)  - Importance of treating DM  - Lifestyle changes: diet and exercise  - How to check BG with glucometer and logging values  - Goals for treatment (fasting BG 70-120, post-prandial <150, A1C <7%)  - How to give insulin injection (subcutaneous injection, appropriate sites, rotation sites)  - Proper disposal of lancets and needles  - Signs and symptoms of hypoglycemia and management  - Glucagon use and administration  - Signs and symptoms of hyperglycemia  - Storage of insulin   - Annual check-up for vision  - Daily feet checks and annually with PCP    Understanding of medications and plan: good     Escript: does not need prescriptions for medications today.    The visit was 40 minutes in duration and greater than 50% was spent in direct face to face counseling and coordination of care regarding medication regimen, side effect profiles, drug drug interaction. Patient was seen under supervising physician Dr. Ethelle Lyon. During this visit, the following was completed:   Clinical Data Collection  BG log data assessment  Labs within / out of range were evaluated   Simple/Complex Treatment Plan > 1 DS   Simple/Complex symptom management  Patient education was completed for 1-5/ 6-10/ 11-24/ 25-60/ > 60 minutes    All questions/concerns were addressed to the patient's satisfaction.  11/08/2015 4:26 PM  __________________________________________  PATIENT SEEN AND EVALUATED BY:  Noah Charon, PHARMD, CPP  Carroll Kinds, PHARMD, CPP PGY-2 Resident   [employer]  PAGER [phone]

## 2015-12-02 ENCOUNTER — Other Ambulatory Visit
Admission: RE | Admit: 2015-12-02 | Discharge: 2015-12-02 | Disposition: A | Discharge status: Home / Self Care | Payer: Medicare Other | Source: Ambulatory Visit | Attending: Nephrology | Admitting: Nephrology

## 2015-12-02 DIAGNOSIS — E1129 Type 2 diabetes mellitus with other diabetic kidney complication: Secondary | ICD-10-CM | POA: Diagnosis not present

## 2015-12-02 DIAGNOSIS — Z79899 Other long term (current) drug therapy: Secondary | ICD-10-CM | POA: Diagnosis not present

## 2015-12-02 DIAGNOSIS — E559 Vitamin D deficiency, unspecified: Secondary | ICD-10-CM | POA: Diagnosis not present

## 2015-12-02 DIAGNOSIS — N390 Urinary tract infection, site not specified: Secondary | ICD-10-CM | POA: Diagnosis not present

## 2015-12-02 DIAGNOSIS — Z940 Kidney transplant status: Secondary | ICD-10-CM | POA: Insufficient documentation

## 2015-12-02 DIAGNOSIS — D631 Anemia in chronic kidney disease: Secondary | ICD-10-CM | POA: Diagnosis not present

## 2015-12-02 LAB — CBC WITH DIFFERENTIAL/PLATELET
BASOS ABS: 0.2 10*3/uL — AB (ref 0–0.1)
BASOS PCT: 2 %
EOS ABS: 0.1 10*3/uL (ref 0–0.7)
EOS PCT: 1 %
HCT: 52 % — ABNORMAL HIGH (ref 35.0–47.0)
Hemoglobin: 16.8 g/dL — ABNORMAL HIGH (ref 12.0–16.0)
Lymphocytes Relative: 19 %
Lymphs Abs: 2 10*3/uL (ref 1.0–3.6)
MCH: 24.9 pg — ABNORMAL LOW (ref 26.0–34.0)
MCHC: 32.4 g/dL (ref 32.0–36.0)
MCV: 76.9 fL — ABNORMAL LOW (ref 80.0–100.0)
MONO ABS: 1.3 10*3/uL — AB (ref 0.2–0.9)
Monocytes Relative: 12 %
NEUTROS ABS: 7.3 10*3/uL — AB (ref 1.4–6.5)
Neutrophils Relative %: 66 %
PLATELETS: 543 10*3/uL — AB (ref 150–440)
RBC: 6.76 MIL/uL — ABNORMAL HIGH (ref 3.80–5.20)
RDW: 19.5 % — AB (ref 11.5–14.5)
WBC: 10.8 10*3/uL (ref 3.6–11.0)

## 2015-12-02 LAB — BASIC METABOLIC PANEL
ANION GAP: 9 (ref 5–15)
BUN: 12 mg/dL (ref 6–20)
CALCIUM: 9.4 mg/dL (ref 8.9–10.3)
CO2: 29 mmol/L (ref 22–32)
CREATININE: 0.81 mg/dL (ref 0.44–1.00)
Chloride: 99 mmol/L — ABNORMAL LOW (ref 101–111)
Glucose, Bld: 85 mg/dL (ref 65–99)
Potassium: 4.2 mmol/L (ref 3.5–5.1)
SODIUM: 137 mmol/L (ref 135–145)

## 2015-12-02 LAB — MAGNESIUM: MAGNESIUM: 1.9 mg/dL (ref 1.7–2.4)

## 2015-12-02 LAB — PHOSPHORUS: Phosphorus: 3.8 mg/dL (ref 2.5–4.6)

## 2016-01-11 ENCOUNTER — Other Ambulatory Visit
Admission: RE | Admit: 2016-01-11 | Discharge: 2016-01-11 | Disposition: A | Discharge status: Home / Self Care | Payer: Medicare Other | Source: Ambulatory Visit | Attending: Nephrology | Admitting: Nephrology

## 2016-01-11 DIAGNOSIS — Z79899 Other long term (current) drug therapy: Secondary | ICD-10-CM | POA: Diagnosis not present

## 2016-01-11 DIAGNOSIS — N390 Urinary tract infection, site not specified: Secondary | ICD-10-CM | POA: Diagnosis not present

## 2016-01-11 DIAGNOSIS — E559 Vitamin D deficiency, unspecified: Secondary | ICD-10-CM | POA: Insufficient documentation

## 2016-01-11 DIAGNOSIS — Z940 Kidney transplant status: Secondary | ICD-10-CM | POA: Diagnosis present

## 2016-01-11 DIAGNOSIS — D631 Anemia in chronic kidney disease: Secondary | ICD-10-CM | POA: Insufficient documentation

## 2016-01-11 DIAGNOSIS — E1129 Type 2 diabetes mellitus with other diabetic kidney complication: Secondary | ICD-10-CM | POA: Insufficient documentation

## 2016-01-11 LAB — BASIC METABOLIC PANEL
ANION GAP: 10 (ref 5–15)
BUN: 18 mg/dL (ref 6–20)
CO2: 29 mmol/L (ref 22–32)
Calcium: 9.3 mg/dL (ref 8.9–10.3)
Chloride: 98 mmol/L — ABNORMAL LOW (ref 101–111)
Creatinine, Ser: 0.89 mg/dL (ref 0.44–1.00)
GFR calc Af Amer: >60 mL/min (ref >60)
Glucose, Bld: 164 mg/dL — ABNORMAL HIGH (ref 65–99)
POTASSIUM: 3.5 mmol/L (ref 3.5–5.1)
SODIUM: 137 mmol/L (ref 135–145)

## 2016-01-11 LAB — CBC WITH DIFFERENTIAL/PLATELET
BASOS ABS: 0.1 10*3/uL (ref 0–0.1)
Eosinophils Absolute: 0.1 10*3/uL (ref 0–0.7)
HCT: 52.9 % — ABNORMAL HIGH (ref 35.0–47.0)
Hemoglobin: 17.1 g/dL — ABNORMAL HIGH (ref 12.0–16.0)
Lymphs Abs: 1.5 10*3/uL (ref 1.0–3.6)
MCH: 25.3 pg — ABNORMAL LOW (ref 26.0–34.0)
MCHC: 32.4 g/dL (ref 32.0–36.0)
MCV: 78.1 fL — AB (ref 80.0–100.0)
Monocytes Absolute: 1 10*3/uL — ABNORMAL HIGH (ref 0.2–0.9)
Monocytes Relative: 10 %
NEUTROS ABS: 7.7 10*3/uL — AB (ref 1.4–6.5)
Neutrophils Relative %: 73 %
PLATELETS: 489 10*3/uL — AB (ref 150–440)
RBC: 6.77 MIL/uL — AB (ref 3.80–5.20)
RDW: 21 % — ABNORMAL HIGH (ref 11.5–14.5)
WBC: 10.4 10*3/uL (ref 3.6–11.0)

## 2016-01-11 LAB — LIPID PANEL
CHOL/HDL RATIO: 2.8 ratio
Cholesterol: 135 mg/dL (ref 0–200)
HDL: 48 mg/dL (ref >40)
LDL CALC: 56 mg/dL (ref 0–99)
Triglycerides: 153 mg/dL — ABNORMAL HIGH (ref <150)
VLDL: 31 mg/dL (ref 0–40)

## 2016-01-11 LAB — AST: AST: 22 U/L (ref 15–41)

## 2016-01-11 LAB — MAGNESIUM: Magnesium: 1.8 mg/dL (ref 1.7–2.4)

## 2016-01-11 LAB — ALKALINE PHOSPHATASE: ALK PHOS: 86 U/L (ref 38–126)

## 2016-01-11 LAB — ALT: ALT: 21 U/L (ref 14–54)

## 2016-01-11 LAB — PHOSPHORUS: PHOSPHORUS: 3.4 mg/dL (ref 2.5–4.6)

## 2016-01-11 LAB — ALBUMIN: ALBUMIN: 3.6 g/dL (ref 3.5–5.0)

## 2016-01-11 LAB — BILIRUBIN, TOTAL: Total Bilirubin: 1.1 mg/dL (ref 0.3–1.2)

## 2016-01-11 LAB — GAMMA GT: GGT: 19 U/L (ref 7–50)

## 2016-01-19 ENCOUNTER — Other Ambulatory Visit: Payer: Self-pay | Admitting: Internal Medicine

## 2016-01-19 DIAGNOSIS — Z1231 Encounter for screening mammogram for malignant neoplasm of breast: Secondary | ICD-10-CM

## 2016-01-31 ENCOUNTER — Ambulatory Visit
Admission: RE | Admit: 2016-01-31 | Discharge: 2016-01-31 | Disposition: A | Discharge status: Home / Self Care | Payer: Medicare Other | Source: Ambulatory Visit | Attending: Internal Medicine | Admitting: Internal Medicine

## 2016-01-31 ENCOUNTER — Other Ambulatory Visit: Payer: Self-pay | Admitting: Internal Medicine

## 2016-01-31 DIAGNOSIS — Z1231 Encounter for screening mammogram for malignant neoplasm of breast: Secondary | ICD-10-CM

## 2016-02-14 ENCOUNTER — Other Ambulatory Visit
Admission: RE | Admit: 2016-02-14 | Discharge: 2016-02-14 | Disposition: A | Discharge status: Home / Self Care | Payer: Medicare Other | Source: Ambulatory Visit | Attending: Nephrology | Admitting: Nephrology

## 2016-02-14 DIAGNOSIS — Z79899 Other long term (current) drug therapy: Secondary | ICD-10-CM | POA: Diagnosis not present

## 2016-02-14 DIAGNOSIS — E1129 Type 2 diabetes mellitus with other diabetic kidney complication: Secondary | ICD-10-CM | POA: Insufficient documentation

## 2016-02-14 DIAGNOSIS — Z940 Kidney transplant status: Secondary | ICD-10-CM | POA: Insufficient documentation

## 2016-02-14 DIAGNOSIS — D631 Anemia in chronic kidney disease: Secondary | ICD-10-CM | POA: Diagnosis not present

## 2016-02-14 LAB — ALBUMIN: ALBUMIN: 3.5 g/dL (ref 3.5–5.0)

## 2016-02-14 LAB — HEMOGLOBIN A1C: Hgb A1c MFr Bld: 6.5 % — ABNORMAL HIGH (ref 4.0–6.0)

## 2016-02-14 LAB — GAMMA GT: GGT: 15 U/L (ref 7–50)

## 2016-02-14 LAB — AST: AST: 19 U/L (ref 15–41)

## 2016-02-14 LAB — ALT: ALT: 18 U/L (ref 14–54)

## 2016-02-14 LAB — BILIRUBIN, TOTAL: Total Bilirubin: 0.3 mg/dL (ref 0.3–1.2)

## 2016-02-14 LAB — IRON AND TIBC
IRON: 42 ug/dL (ref 28–170)
SATURATION RATIOS: 15 % (ref 10.4–31.8)
TIBC: 289 ug/dL (ref 250–450)
UIBC: 247 ug/dL

## 2016-02-14 LAB — FERRITIN: Ferritin: 19 ng/mL (ref 11–307)

## 2016-02-14 LAB — LIPID PANEL
CHOL/HDL RATIO: 2.6 ratio
CHOLESTEROL: 137 mg/dL (ref 0–200)
HDL: 53 mg/dL (ref >40)
LDL Cholesterol: 61 mg/dL (ref 0–99)
TRIGLYCERIDES: 116 mg/dL (ref <150)
VLDL: 23 mg/dL (ref 0–40)

## 2016-02-14 LAB — ALKALINE PHOSPHATASE: Alkaline Phosphatase: 88 U/L (ref 38–126)

## 2016-03-13 ENCOUNTER — Other Ambulatory Visit
Admission: RE | Admit: 2016-03-13 | Discharge: 2016-03-13 | Disposition: A | Discharge status: Home / Self Care | Payer: Medicare Other | Source: Ambulatory Visit | Attending: Nephrology | Admitting: Nephrology

## 2016-03-13 DIAGNOSIS — E559 Vitamin D deficiency, unspecified: Secondary | ICD-10-CM | POA: Diagnosis present

## 2016-03-13 DIAGNOSIS — D631 Anemia in chronic kidney disease: Secondary | ICD-10-CM | POA: Insufficient documentation

## 2016-03-13 DIAGNOSIS — E1129 Type 2 diabetes mellitus with other diabetic kidney complication: Secondary | ICD-10-CM | POA: Insufficient documentation

## 2016-03-13 DIAGNOSIS — T8610 Unspecified complication of kidney transplant: Secondary | ICD-10-CM | POA: Diagnosis not present

## 2016-03-13 LAB — BASIC METABOLIC PANEL
ANION GAP: 5 (ref 5–15)
BUN: 16 mg/dL (ref 6–20)
CALCIUM: 9.4 mg/dL (ref 8.9–10.3)
CHLORIDE: 103 mmol/L (ref 101–111)
CO2: 29 mmol/L (ref 22–32)
Creatinine, Ser: 0.86 mg/dL (ref 0.44–1.00)
GLUCOSE: 111 mg/dL — AB (ref 65–99)
Potassium: 4.4 mmol/L (ref 3.5–5.1)
Sodium: 137 mmol/L (ref 135–145)

## 2016-03-13 LAB — CBC WITH DIFFERENTIAL/PLATELET
BASOS PCT: 1 %
Basophils Absolute: 0.1 10*3/uL (ref 0–0.1)
Eosinophils Absolute: 0.1 10*3/uL (ref 0–0.7)
Eosinophils Relative: 1 %
HEMATOCRIT: 53.9 % — AB (ref 35.0–47.0)
HEMOGLOBIN: 17.6 g/dL — AB (ref 12.0–16.0)
LYMPHS PCT: 17 %
Lymphs Abs: 1.8 10*3/uL (ref 1.0–3.6)
MCH: 26.2 pg (ref 26.0–34.0)
MCHC: 32.8 g/dL (ref 32.0–36.0)
MCV: 80 fL (ref 80.0–100.0)
MONO ABS: 1.2 10*3/uL — AB (ref 0.2–0.9)
MONOS PCT: 11 %
NEUTROS ABS: 7.6 10*3/uL — AB (ref 1.4–6.5)
NEUTROS PCT: 70 %
Platelets: 493 10*3/uL — ABNORMAL HIGH (ref 150–440)
RBC: 6.74 MIL/uL — ABNORMAL HIGH (ref 3.80–5.20)
RDW: 17.6 % — ABNORMAL HIGH (ref 11.5–14.5)
WBC: 10.7 10*3/uL (ref 3.6–11.0)

## 2016-03-13 LAB — MAGNESIUM: Magnesium: 1.7 mg/dL (ref 1.7–2.4)

## 2016-03-13 LAB — PHOSPHORUS: Phosphorus: 3.6 mg/dL (ref 2.5–4.6)

## 2016-04-11 ENCOUNTER — Other Ambulatory Visit
Admission: RE | Admit: 2016-04-11 | Discharge: 2016-04-11 | Disposition: A | Discharge status: Home / Self Care | Payer: Medicare Other | Source: Ambulatory Visit | Attending: Nephrology | Admitting: Nephrology

## 2016-04-11 DIAGNOSIS — Z940 Kidney transplant status: Secondary | ICD-10-CM | POA: Insufficient documentation

## 2016-04-11 DIAGNOSIS — D631 Anemia in chronic kidney disease: Secondary | ICD-10-CM | POA: Diagnosis not present

## 2016-04-11 DIAGNOSIS — E1129 Type 2 diabetes mellitus with other diabetic kidney complication: Secondary | ICD-10-CM | POA: Insufficient documentation

## 2016-04-11 DIAGNOSIS — E559 Vitamin D deficiency, unspecified: Secondary | ICD-10-CM | POA: Diagnosis not present

## 2016-04-11 LAB — CBC WITH DIFFERENTIAL/PLATELET
BASOS PCT: 1 %
Basophils Absolute: 0.1 10*3/uL (ref 0–0.1)
EOS ABS: 0.1 10*3/uL (ref 0–0.7)
Eosinophils Relative: 1 %
HEMATOCRIT: 52.7 % — AB (ref 35.0–47.0)
HEMOGLOBIN: 17.1 g/dL — AB (ref 12.0–16.0)
LYMPHS ABS: 1.7 10*3/uL (ref 1.0–3.6)
Lymphocytes Relative: 14 %
MCH: 26.2 pg (ref 26.0–34.0)
MCHC: 32.4 g/dL (ref 32.0–36.0)
MCV: 80.8 fL (ref 80.0–100.0)
MONO ABS: 1.3 10*3/uL — AB (ref 0.2–0.9)
MONOS PCT: 11 %
Neutro Abs: 8.5 10*3/uL — ABNORMAL HIGH (ref 1.4–6.5)
Neutrophils Relative %: 73 %
Platelets: 492 10*3/uL — ABNORMAL HIGH (ref 150–440)
RBC: 6.53 MIL/uL — ABNORMAL HIGH (ref 3.80–5.20)
RDW: 17 % — AB (ref 11.5–14.5)
WBC: 11.7 10*3/uL — ABNORMAL HIGH (ref 3.6–11.0)

## 2016-04-11 LAB — BASIC METABOLIC PANEL
Anion gap: 4 — ABNORMAL LOW (ref 5–15)
BUN: 13 mg/dL (ref 6–20)
CHLORIDE: 106 mmol/L (ref 101–111)
CO2: 28 mmol/L (ref 22–32)
CREATININE: 0.65 mg/dL (ref 0.44–1.00)
Calcium: 9.1 mg/dL (ref 8.9–10.3)
GFR calc non Af Amer: >60 mL/min (ref >60)
Glucose, Bld: 135 mg/dL — ABNORMAL HIGH (ref 65–99)
Potassium: 4.2 mmol/L (ref 3.5–5.1)
Sodium: 138 mmol/L (ref 135–145)

## 2016-04-11 LAB — MAGNESIUM: MAGNESIUM: 1.8 mg/dL (ref 1.7–2.4)

## 2016-04-11 LAB — PHOSPHORUS: Phosphorus: 3.2 mg/dL (ref 2.5–4.6)

## 2016-04-22 ENCOUNTER — Ambulatory Visit
Admission: EM | Admit: 2016-04-22 | Discharge: 2016-04-22 | Disposition: A | Discharge status: Home / Self Care | Payer: Medicare Other | Attending: Family Medicine | Admitting: Family Medicine

## 2016-04-22 DIAGNOSIS — L089 Local infection of the skin and subcutaneous tissue, unspecified: Secondary | ICD-10-CM | POA: Diagnosis not present

## 2016-04-22 DIAGNOSIS — S00521A Blister (nonthermal) of lip, initial encounter: Secondary | ICD-10-CM

## 2016-04-22 MED ORDER — CLINDAMYCIN HCL 300 MG PO CAPS: 300.0000 mg | ORAL_CAPSULE | Freq: Three times a day (TID) | ORAL | Status: DC

## 2016-04-22 NOTE — ED Notes (Signed)
After dental work on Thursday. Blistered on left bottom lip. Denies pain. Left jaw is slightly swollen and sore after receiving Novacaine injection.

## 2016-04-22 NOTE — ED Provider Notes (Signed)
CSN: [id number]     Arrival date & time 04/22/16  1000 History   First MD Initiated Contact with Patient 04/22/16 1240     Chief Complaint  Patient presents with  . Oral Swelling   (Consider location/radiation/quality/duration/timing/severity/associated sxs/prior Treatment) HPI Comments: 70 yo female with a 2 days h/o lower lip left area lesion, swelling, redness, pain, and now some slight drainage.  States that the day prior to lesion appearing she had dental work done. 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 Denies any fevers, chills.   The history is provided by the patient.    Past Medical History  Diagnosis Date  . Renal disorder    Past Surgical History  Procedure Laterality Date  . Nephrectomy transplanted organ    . Tubal ligation    . Tonsillectomy    . Insertion of dialysis catheter     No family history on file. Social History  Substance Use Topics  . Smoking status: Former Research scientist (life sciences)  . Smokeless tobacco: None  . Alcohol Use: No   OB History    No data available     Review of Systems  Allergies  Penicillins  Home Medications   Prior to Admission medications   Medication Sig Start Date End Date Taking? Authorizing Provider  atorvastatin (LIPITOR) 20 MG tablet Take 20 mg by mouth daily.   Yes Historical Provider, MD  Everolimus 0.5 MG TABS Take by mouth.   Yes Historical Provider, MD  Insulin NPH Human, Isophane, (NOVOLIN N [place]) Inject into the skin.   Yes Historical Provider, MD  Insulin Regular Human (NOVOLIN R [place]) Inject as directed.   Yes Historical Provider, MD  levothyroxine (SYNTHROID, LEVOTHROID) 112 MCG tablet Take 112 mcg by mouth daily before breakfast.   Yes Historical Provider, MD  tacrolimus (PROGRAF) 1 MG capsule Take 1 mg by mouth 2 (two) times daily.   Yes Historical Provider, MD  clindamycin (CLEOCIN) 300 MG capsule Take 1 capsule (300 mg total) by mouth 3 (three) times daily. 04/22/16   Norval Gable, MD    Meds Ordered and Administered this Visit  Medications - No data to display  BP 140/71 mmHg  Pulse 74  Temp(Src) 97.8 F (36.6 C) (Oral)  Resp 16  Ht 5\' 4"  (1.626 m)  Wt 162 lb (73.483 kg)  BMI 27.79 kg/m2  SpO2 96% No data found.   Physical Exam  Constitutional: She appears well-developed and well-nourished. No distress.  HENT:  Head: Normocephalic and atraumatic.  Nose: No nose lacerations, sinus tenderness, nasal deformity, septal deviation or nasal septal hematoma. No epistaxis.  No foreign bodies.  Mouth/Throat: Uvula is midline, oropharynx is clear and moist and mucous membranes are normal. Oral lesions (left lower lip lesion/open sore with surrounding erythema, edema and tenderness to palpation) present. No oropharyngeal exudate.  Eyes: No scleral icterus.  Neck: Neck supple. No thyromegaly present.  Cardiovascular: Normal rate.   Lymphadenopathy:    She has no cervical adenopathy.  Skin: She is not diaphoretic.  Nursing note and vitals reviewed.   ED Course  Procedures (including critical care time)  Labs Review Labs Reviewed - No data to display  Imaging Review No results found.   Visual Acuity Review  Right Eye Distance:   Left Eye Distance:   Bilateral Distance:    Right Eye Near:   Left Eye Near:    Bilateral Near:         MDM   1. Blister of lip  with infection, initial encounter    Discharge Medication List as of 04/22/2016  1:32 PM    START taking these medications   Details  clindamycin (CLEOCIN) 300 MG capsule Take 1 capsule (300 mg total) by mouth 3 (three) times daily., Starting 04/22/2016, Until Discontinued, Normal       1. diagnosis reviewed with patient 2. rx as per orders above; reviewed possible side effects, interactions, risks and benefits  3. Recommend supportive treatment with oral rinses 4. Follow-up prn if symptoms worsen or don't improve    Norval Gable, MD 04/22/16 1443

## 2016-06-06 ENCOUNTER — Other Ambulatory Visit
Admission: RE | Admit: 2016-06-06 | Discharge: 2016-06-06 | Disposition: A | Discharge status: Home / Self Care | Payer: Medicare Other | Source: Ambulatory Visit | Attending: Nephrology | Admitting: Nephrology

## 2016-06-06 DIAGNOSIS — D631 Anemia in chronic kidney disease: Secondary | ICD-10-CM | POA: Insufficient documentation

## 2016-06-06 DIAGNOSIS — Z940 Kidney transplant status: Secondary | ICD-10-CM | POA: Insufficient documentation

## 2016-06-06 DIAGNOSIS — Z79899 Other long term (current) drug therapy: Secondary | ICD-10-CM | POA: Diagnosis not present

## 2016-06-06 DIAGNOSIS — E1129 Type 2 diabetes mellitus with other diabetic kidney complication: Secondary | ICD-10-CM | POA: Insufficient documentation

## 2016-06-06 DIAGNOSIS — D899 Disorder involving the immune mechanism, unspecified: Secondary | ICD-10-CM | POA: Diagnosis present

## 2016-06-06 DIAGNOSIS — Z9483 Pancreas transplant status: Secondary | ICD-10-CM | POA: Diagnosis not present

## 2016-06-06 DIAGNOSIS — E559 Vitamin D deficiency, unspecified: Secondary | ICD-10-CM | POA: Insufficient documentation

## 2016-06-06 LAB — CBC WITH DIFFERENTIAL/PLATELET
BASOS ABS: 0.1 10*3/uL (ref 0–0.1)
Basophils Relative: 1 %
Eosinophils Absolute: 0.1 10*3/uL (ref 0–0.7)
HEMATOCRIT: 54.7 % — AB (ref 35.0–47.0)
Hemoglobin: 17.8 g/dL — ABNORMAL HIGH (ref 12.0–16.0)
LYMPHS ABS: 1.8 10*3/uL (ref 1.0–3.6)
MCH: 26.6 pg (ref 26.0–34.0)
MCHC: 32.6 g/dL (ref 32.0–36.0)
MCV: 81.5 fL (ref 80.0–100.0)
MONO ABS: 1.1 10*3/uL — AB (ref 0.2–0.9)
NEUTROS ABS: 6.7 10*3/uL — AB (ref 1.4–6.5)
Neutrophils Relative %: 69 %
PLATELETS: 485 10*3/uL — AB (ref 150–440)
RBC: 6.71 MIL/uL — ABNORMAL HIGH (ref 3.80–5.20)
RDW: 17.4 % — AB (ref 11.5–14.5)
WBC: 9.8 10*3/uL (ref 3.6–11.0)

## 2016-06-06 LAB — BASIC METABOLIC PANEL
Anion gap: 5 (ref 5–15)
BUN: 14 mg/dL (ref 6–20)
CALCIUM: 9.3 mg/dL (ref 8.9–10.3)
CHLORIDE: 106 mmol/L (ref 101–111)
CO2: 28 mmol/L (ref 22–32)
CREATININE: 0.77 mg/dL (ref 0.44–1.00)
GFR calc non Af Amer: >60 mL/min (ref >60)
GLUCOSE: 126 mg/dL — AB (ref 65–99)
Potassium: 4.5 mmol/L (ref 3.5–5.1)
Sodium: 139 mmol/L (ref 135–145)

## 2016-06-06 LAB — PHOSPHORUS: Phosphorus: 3.3 mg/dL (ref 2.5–4.6)

## 2016-06-06 LAB — MAGNESIUM: Magnesium: 1.9 mg/dL (ref 1.7–2.4)

## 2016-07-07 ENCOUNTER — Other Ambulatory Visit
Admission: RE | Admit: 2016-07-07 | Discharge: 2016-07-07 | Disposition: A | Discharge status: Home / Self Care | Payer: Medicare Other | Source: Ambulatory Visit | Attending: Nephrology | Admitting: Nephrology

## 2016-07-07 DIAGNOSIS — D631 Anemia in chronic kidney disease: Secondary | ICD-10-CM | POA: Insufficient documentation

## 2016-07-07 DIAGNOSIS — D899 Disorder involving the immune mechanism, unspecified: Secondary | ICD-10-CM | POA: Insufficient documentation

## 2016-07-07 DIAGNOSIS — Z79899 Other long term (current) drug therapy: Secondary | ICD-10-CM | POA: Diagnosis not present

## 2016-07-07 DIAGNOSIS — E1129 Type 2 diabetes mellitus with other diabetic kidney complication: Secondary | ICD-10-CM | POA: Diagnosis not present

## 2016-07-07 DIAGNOSIS — Z940 Kidney transplant status: Secondary | ICD-10-CM | POA: Insufficient documentation

## 2016-07-07 DIAGNOSIS — E559 Vitamin D deficiency, unspecified: Secondary | ICD-10-CM | POA: Insufficient documentation

## 2016-07-07 DIAGNOSIS — Z9483 Pancreas transplant status: Secondary | ICD-10-CM | POA: Insufficient documentation

## 2016-07-07 LAB — CBC WITH DIFFERENTIAL/PLATELET
BASOS PCT: 1 %
Basophils Absolute: 0.1 10*3/uL (ref 0–0.1)
EOS ABS: 0.1 10*3/uL (ref 0–0.7)
Eosinophils Relative: 2 %
HEMATOCRIT: 55.6 % — AB (ref 35.0–47.0)
HEMOGLOBIN: 18.3 g/dL — AB (ref 12.0–16.0)
LYMPHS ABS: 1.8 10*3/uL (ref 1.0–3.6)
Lymphocytes Relative: 19 %
MCH: 26.7 pg (ref 26.0–34.0)
MCHC: 32.9 g/dL (ref 32.0–36.0)
MCV: 81.3 fL (ref 80.0–100.0)
Monocytes Absolute: 1.2 10*3/uL — ABNORMAL HIGH (ref 0.2–0.9)
Monocytes Relative: 13 %
NEUTROS ABS: 6.1 10*3/uL (ref 1.4–6.5)
NEUTROS PCT: 65 %
Platelets: 449 10*3/uL — ABNORMAL HIGH (ref 150–440)
RBC: 6.84 MIL/uL — AB (ref 3.80–5.20)
RDW: 17.3 % — ABNORMAL HIGH (ref 11.5–14.5)
WBC: 9.2 10*3/uL (ref 3.6–11.0)

## 2016-07-07 LAB — BASIC METABOLIC PANEL
ANION GAP: 9 (ref 5–15)
BUN: 17 mg/dL (ref 6–20)
CHLORIDE: 105 mmol/L (ref 101–111)
CO2: 24 mmol/L (ref 22–32)
Calcium: 9.2 mg/dL (ref 8.9–10.3)
Creatinine, Ser: 0.69 mg/dL (ref 0.44–1.00)
GFR calc Af Amer: >60 mL/min (ref >60)
GLUCOSE: 100 mg/dL — AB (ref 65–99)
POTASSIUM: 3.7 mmol/L (ref 3.5–5.1)
Sodium: 138 mmol/L (ref 135–145)

## 2016-07-07 LAB — PHOSPHORUS: PHOSPHORUS: 3.4 mg/dL (ref 2.5–4.6)

## 2016-07-07 LAB — MAGNESIUM: Magnesium: 1.8 mg/dL (ref 1.7–2.4)

## 2016-08-08 ENCOUNTER — Encounter: Payer: Self-pay | Admitting: General Surgery

## 2016-08-10 ENCOUNTER — Other Ambulatory Visit
Admission: RE | Admit: 2016-08-10 | Discharge: 2016-08-10 | Disposition: A | Discharge status: Home / Self Care | Payer: Medicare Other | Source: Ambulatory Visit | Attending: Nephrology | Admitting: Nephrology

## 2016-08-10 DIAGNOSIS — D899 Disorder involving the immune mechanism, unspecified: Secondary | ICD-10-CM | POA: Diagnosis not present

## 2016-08-10 DIAGNOSIS — Z940 Kidney transplant status: Secondary | ICD-10-CM | POA: Insufficient documentation

## 2016-08-10 DIAGNOSIS — N189 Chronic kidney disease, unspecified: Secondary | ICD-10-CM | POA: Diagnosis not present

## 2016-08-10 DIAGNOSIS — Z9483 Pancreas transplant status: Secondary | ICD-10-CM | POA: Diagnosis not present

## 2016-08-10 DIAGNOSIS — Z79899 Other long term (current) drug therapy: Secondary | ICD-10-CM | POA: Diagnosis not present

## 2016-08-10 DIAGNOSIS — E1129 Type 2 diabetes mellitus with other diabetic kidney complication: Secondary | ICD-10-CM | POA: Insufficient documentation

## 2016-08-10 DIAGNOSIS — D631 Anemia in chronic kidney disease: Secondary | ICD-10-CM | POA: Insufficient documentation

## 2016-08-10 LAB — GAMMA GT: GGT: 17 U/L (ref 7–50)

## 2016-08-10 LAB — LIPID PANEL
CHOL/HDL RATIO: 2.4 ratio
Cholesterol: 125 mg/dL (ref 0–200)
HDL: 52 mg/dL (ref >40)
LDL Cholesterol: 48 mg/dL (ref 0–99)
Triglycerides: 124 mg/dL (ref <150)
VLDL: 25 mg/dL (ref 0–40)

## 2016-08-10 LAB — HEPATIC FUNCTION PANEL
ALBUMIN: 3.9 g/dL (ref 3.5–5.0)
ALK PHOS: 97 U/L (ref 38–126)
ALT: 33 U/L (ref 14–54)
AST: 29 U/L (ref 15–41)
Bilirubin, Direct: 0.2 mg/dL (ref 0.1–0.5)
Indirect Bilirubin: 0.7 mg/dL (ref 0.3–0.9)
TOTAL PROTEIN: 7 g/dL (ref 6.5–8.1)
Total Bilirubin: 0.9 mg/dL (ref 0.3–1.2)

## 2016-08-17 ENCOUNTER — Ambulatory Visit (INDEPENDENT_AMBULATORY_CARE_PROVIDER_SITE_OTHER): Payer: Medicare Other | Admitting: General Surgery

## 2016-08-17 ENCOUNTER — Encounter: Payer: Self-pay | Admitting: General Surgery

## 2016-08-17 VITALS — BP 138/78 | HR 74 | Resp 14 | Ht 64.0 in | Wt 164.0 lb

## 2016-08-17 DIAGNOSIS — R194 Change in bowel habit: Secondary | ICD-10-CM | POA: Diagnosis not present

## 2016-08-17 MED ORDER — POLYETHYLENE GLYCOL 3350 17 GM/SCOOP PO POWD: 1.0000 | Freq: Once | ORAL | 0 refills | Status: AC

## 2016-08-17 NOTE — Patient Instructions (Addendum)
Colonoscopy A colonoscopy is an exam to look at the entire large intestine (colon). This exam can help find problems such as tumors, polyps, inflammation, and areas of bleeding. The exam takes about 1 hour.  LET [facility] CARE PROVIDER KNOW ABOUT:   Any allergies you have.  All medicines you are taking, including vitamins, herbs, eye drops, creams, and over-the-counter medicines.  Previous problems you or members of your family have had with the use of anesthetics.  Any blood disorders you have.  Previous surgeries you have had.  Medical conditions you have. RISKS AND COMPLICATIONS  Generally, this is a safe procedure. However, as with any procedure, complications can occur. Possible complications include:  Bleeding.  Tearing or rupture of the colon wall.  Reaction to medicines given during the exam.  Infection (rare). BEFORE THE PROCEDURE   Ask your health care provider about changing or stopping your regular medicines.  You may be prescribed an oral bowel prep. This involves drinking a large amount of medicated liquid, starting the day before your procedure. The liquid will cause you to have multiple loose stools until your stool is almost clear or light green. This cleans out your colon in preparation for the procedure.  Do not eat or drink anything else once you have started the bowel prep, unless your health care provider tells you it is safe to do so.  Arrange for someone to drive you home after the procedure. PROCEDURE   You will be given medicine to help you relax (sedative).  You will lie on your side with your knees bent.  A long, flexible tube with a light and camera on the end (colonoscope) will be inserted through the rectum and into the colon. The camera sends video back to a computer screen as it moves through the colon. The colonoscope also releases carbon dioxide gas to inflate the colon. This helps your health care provider see the area better.  During  the exam, your health care provider may take a small tissue sample (biopsy) to be examined under a microscope if any abnormalities are found.  The exam is finished when the entire colon has been viewed. AFTER THE PROCEDURE   Do not drive for 24 hours after the exam.  You may have a small amount of blood in your stool.  You may pass moderate amounts of gas and have mild abdominal cramping or bloating. This is caused by the gas used to inflate your colon during the exam.  Ask when your test results will be ready and how you will get your results. Make sure you get your test results.   This information is not intended to replace advice given to you by your health care provider. Make sure you discuss any questions you have with your health care provider.   Document Released: 12/08/2000 Document Revised: 10/01/2013 Document Reviewed: 08/18/2013 Elsevier Interactive Patient Education [employer].  The patient is scheduled for a Colonoscopy at [facility] on . They are aware to call the day before to get their arrival time. She will hold her Novalin-N the day of prep and procedure. Miralax prescription has been sent into the patient's pharmacy. The patient is aware of date and instructions.

## 2016-08-17 NOTE — Progress Notes (Signed)
Patient ID: Cindy Fischer, female   DOB: 04/16/1946, 69 y.o.   MRN: [id number]  Chief Complaint  Patient presents with  . Colonoscopy    HPI Cindy Fischer is a 69 y.o. female here today for a evaluation colonoscopy. Last colonoscopy 12/02/2010.Every since her transplant she has had some soft stools and bowels changes.  She moves her bowels daily. Patient states she noticed a hemorrhoid about 10 months . No pain or bleeding.  I have reviewed the history of present illness with the patient.  HPI  Past Medical History:  Diagnosis Date  . Cancer (HCC) 2016   skin  . Diabetes mellitus without complication (HCC)   . Renal disorder     Past Surgical History:  Procedure Laterality Date  . COLONOSCOPY  12/02/2010  . INSERTION OF DIALYSIS CATHETER    . 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 TONSILLECTOMY    . TUBAL LIGATION      No family history on file.  Social History Social History  Substance Use Topics  . Smoking status: Former Smoker  . Smokeless tobacco: Not on file  . Alcohol use No    Allergies  Allergen Reactions  . Oxycodone-Acetaminophen Nausea And Vomiting  . Penicillins Rash    Current Outpatient Prescriptions  Medication Sig Dispense Refill  . aspirin 81 MG chewable tablet Chew 81 mg by mouth daily.    . atorvastatin (LIPITOR) 20 MG tablet Take 20 mg by mouth daily.    . cholecalciferol (VITAMIN D) 400 units TABS tablet Take 400 Units by mouth.    . Everolimus 0.5 MG TABS Take by mouth.    . Insulin NPH Human, Isophane, (NOVOLIN N [place]) Inject into the skin.    . Insulin Regular Human (NOVOLIN R IJ) Inject as directed.    . levothyroxine (SYNTHROID, LEVOTHROID) 112 MCG tablet Take 112 mcg by mouth daily before breakfast.    . ranitidine (ZANTAC) 150 MG tablet Take 150 mg by mouth 2 (two) times daily.    . tacrolimus (PROGRAF) 1 MG capsule Take 1 mg by mouth 2 (two) times daily.     No current facility-administered medications for this visit.      Review of Systems Review of Systems  Constitutional: Negative.   Respiratory: Negative.   Cardiovascular: Negative.   Gastrointestinal: Negative.     Blood pressure 138/78, pulse 74, resp. rate 14, height 5' 4" (1.626 m), weight 164 lb (74.4 kg).  Physical Exam Physical Exam  Constitutional: She is oriented to person, place, and time. She appears well-developed and well-nourished.  Eyes: Conjunctivae are normal. No scleral icterus.  Neck: Neck supple.  Cardiovascular: Normal rate, regular rhythm and normal heart sounds.   Pulmonary/Chest: Effort normal and breath sounds normal.  Abdominal: Soft. Normal appearance and bowel sounds are normal. There is no hepatomegaly. There is no tenderness. No hernia.  Lymphadenopathy:    She has no cervical adenopathy.  Neurological: She is alert and oriented to person, place, and time.  Skin: Skin is warm and dry.    Data Reviewed Notes and prior colonoscopy reviewed   Assessment    Change in bowel habits    Plan       Colonoscopy with possible biopsy/polypectomy prn: Information regarding the procedure, including its potential risks and complications (including but not limited to perforation of the bowel, which may require emergency surgery to repair, and bleeding) was verbally given to the patient. Educational information regarding lower intestinal endoscopy was given to the   patient. Written instructions for how to complete the bowel prep using Miralax were provided. The importance of drinking ample fluids to avoid dehydration as a result of the prep emphasized.  The patient is scheduled for a Colonoscopy at [facility] on . They are aware to call the day before to get their arrival time. She will hold her Novalin-N the day of prep and procedure. Miralax prescription has been sent into the patient's pharmacy. The patient is aware of date and instructions.    This information has been scribed by Jessica Qualls CMA.    SANKAR,SEEPLAPUTHUR  G 08/18/2016, 9:58 AM   

## 2016-08-18 ENCOUNTER — Encounter: Payer: Self-pay | Admitting: General Surgery

## 2016-08-30 ENCOUNTER — Other Ambulatory Visit: Payer: Self-pay | Admitting: General Surgery

## 2016-09-04 ENCOUNTER — Ambulatory Visit
Admission: RE | Admit: 2016-09-04 | Discharge: 2016-09-04 | Disposition: A | Discharge status: Home / Self Care | Payer: Medicare Other | Source: Ambulatory Visit | Attending: General Surgery | Admitting: General Surgery

## 2016-09-04 ENCOUNTER — Encounter
Admission: RE | Disposition: A | Discharge status: Home / Self Care | Payer: Self-pay | Source: Ambulatory Visit | Attending: General Surgery

## 2016-09-04 ENCOUNTER — Ambulatory Visit: Payer: Medicare Other | Admitting: Anesthesiology

## 2016-09-04 ENCOUNTER — Encounter: Payer: Self-pay | Admitting: *Deleted

## 2016-09-04 DIAGNOSIS — E119 Type 2 diabetes mellitus without complications: Secondary | ICD-10-CM | POA: Diagnosis not present

## 2016-09-04 DIAGNOSIS — Z880 Allergy status to penicillin: Secondary | ICD-10-CM | POA: Diagnosis not present

## 2016-09-04 DIAGNOSIS — Z87891 Personal history of nicotine dependence: Secondary | ICD-10-CM | POA: Diagnosis not present

## 2016-09-04 DIAGNOSIS — N289 Disorder of kidney and ureter, unspecified: Secondary | ICD-10-CM | POA: Insufficient documentation

## 2016-09-04 DIAGNOSIS — K621 Rectal polyp: Secondary | ICD-10-CM | POA: Diagnosis not present

## 2016-09-04 DIAGNOSIS — Z7982 Long term (current) use of aspirin: Secondary | ICD-10-CM | POA: Diagnosis not present

## 2016-09-04 DIAGNOSIS — Z85828 Personal history of other malignant neoplasm of skin: Secondary | ICD-10-CM | POA: Insufficient documentation

## 2016-09-04 DIAGNOSIS — K644 Residual hemorrhoidal skin tags: Secondary | ICD-10-CM

## 2016-09-04 DIAGNOSIS — Z905 Acquired absence of kidney: Secondary | ICD-10-CM | POA: Diagnosis not present

## 2016-09-04 DIAGNOSIS — R194 Change in bowel habit: Secondary | ICD-10-CM | POA: Diagnosis not present

## 2016-09-04 DIAGNOSIS — K648 Other hemorrhoids: Secondary | ICD-10-CM

## 2016-09-04 DIAGNOSIS — Z794 Long term (current) use of insulin: Secondary | ICD-10-CM | POA: Insufficient documentation

## 2016-09-04 HISTORY — DX: Hypothyroidism, unspecified: E03.9

## 2016-09-04 HISTORY — PX: COLONOSCOPY WITH PROPOFOL: SHX5780

## 2016-09-04 LAB — GLUCOSE, CAPILLARY: GLUCOSE-CAPILLARY: 154 mg/dL — AB (ref 65–99)

## 2016-09-04 SURGERY — COLONOSCOPY WITH PROPOFOL
Anesthesia: General

## 2016-09-04 MED ORDER — PROPOFOL 10 MG/ML IV BOLUS
INTRAVENOUS | Status: DC | PRN
  Administered 2016-09-04: 50 mg via INTRAVENOUS

## 2016-09-04 MED ORDER — LIDOCAINE HCL (CARDIAC) 20 MG/ML IV SOLN
INTRAVENOUS | Status: DC | PRN
  Administered 2016-09-04: 40 mg via INTRAVENOUS

## 2016-09-04 MED ORDER — PROPOFOL 500 MG/50ML IV EMUL
INTRAVENOUS | Status: DC | PRN
  Administered 2016-09-04: 157 ug/kg/min via INTRAVENOUS
  Administered 2016-09-04: 175 ug/kg/min via INTRAVENOUS

## 2016-09-04 MED ORDER — SODIUM CHLORIDE 0.9 % IV SOLN
INTRAVENOUS | Status: DC
  Administered 2016-09-04: 09:00:00 via INTRAVENOUS

## 2016-09-04 NOTE — H&P (View-Only) (Signed)
Patient ID: Cindy Fischer, female   DOB: [birth year], 70 y.o.   MRN: [id number]  Chief Complaint  Patient presents with  . Colonoscopy    HPI Cindy Fischer is a 70 y.o. female here today for a evaluation colonoscopy. Last colonoscopy 12/02/2010.Every since Cindy Fischer transplant Cindy Fischer has had some soft stools and bowels changes.  Cindy Fischer moves Cindy Fischer bowels daily. Patient states Cindy Fischer noticed a hemorrhoid about 10 months . No pain or bleeding.  I have reviewed the history of present illness with the patient.  HPI  Past Medical History:  Diagnosis Date  . Cancer ([place]) 2016   skin  . Diabetes mellitus without complication ([place])   . Renal disorder     Past Surgical History:  Procedure Laterality Date  . COLONOSCOPY  12/02/2010  . INSERTION OF DIALYSIS CATHETER    . 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 TONSILLECTOMY    . TUBAL LIGATION      No family history on file.  Social History Social History  Substance Use Topics  . Smoking status: Former Research scientist (life sciences)  . Smokeless tobacco: Not on file  . Alcohol use No    Allergies  Allergen Reactions  . Oxycodone-Acetaminophen Nausea And Vomiting  . Penicillins Rash    Current Outpatient Prescriptions  Medication Sig Dispense Refill  . aspirin 81 MG chewable tablet Chew 81 mg by mouth daily.    Marland Kitchen atorvastatin (LIPITOR) 20 MG tablet Take 20 mg by mouth daily.    . cholecalciferol (VITAMIN D) 400 units TABS tablet Take 400 Units by mouth.    . Everolimus 0.5 MG TABS Take by mouth.    . Insulin NPH Human, Isophane, (NOVOLIN N [place]) Inject into the skin.    . Insulin Regular Human (NOVOLIN R IJ) Inject as directed.    Marland Kitchen levothyroxine (SYNTHROID, LEVOTHROID) 112 MCG tablet Take 112 mcg by mouth daily before breakfast.    . ranitidine (ZANTAC) 150 MG tablet Take 150 mg by mouth 2 (two) times daily.    . tacrolimus (PROGRAF) 1 MG capsule Take 1 mg by mouth 2 (two) times daily.     No current facility-administered medications for this visit.      Review of Systems Review of Systems  Constitutional: Negative.   Respiratory: Negative.   Cardiovascular: Negative.   Gastrointestinal: Negative.     Blood pressure 138/78, pulse 74, resp. rate 14, height 5\' 4"  (1.626 m), weight 164 lb (74.4 kg).  Physical Exam Physical Exam  Constitutional: Cindy Fischer is oriented to person, place, and time. Cindy Fischer appears well-developed and well-nourished.  Eyes: Conjunctivae are normal. No scleral icterus.  Neck: Neck supple.  Cardiovascular: Normal rate, regular rhythm and normal heart sounds.   Pulmonary/Chest: Effort normal and breath sounds normal.  Abdominal: Soft. Normal appearance and bowel sounds are normal. There is no hepatomegaly. There is no tenderness. No hernia.  Lymphadenopathy:    Cindy Fischer has no cervical adenopathy.  Neurological: Cindy Fischer is alert and oriented to person, place, and time.  Skin: Skin is warm and dry.    Data Reviewed Notes and prior colonoscopy reviewed   Assessment    Change in bowel habits    Plan       Colonoscopy with possible biopsy/polypectomy prn: Information regarding the procedure, including its potential risks and complications (including but not limited to perforation of the bowel, which may require emergency surgery to repair, and bleeding) was verbally given to the patient. Educational information regarding lower intestinal endoscopy was given to the  patient. Written instructions for how to complete the bowel prep using Miralax were provided. The importance of drinking ample fluids to avoid dehydration as a result of the prep emphasized.  The patient is scheduled for a Colonoscopy at [facility] on . They are aware to call the day before to get their arrival time. Cindy Fischer will hold Cindy Fischer Novalin-N the day of prep and procedure. Miralax prescription has been sent into the patient's pharmacy. The patient is aware of date and instructions.    This information has been scribed by Gaspar Cola CMA.    SANKAR,SEEPLAPUTHUR  G 08/18/2016, 9:58 AM

## 2016-09-04 NOTE — Transfer of Care (Signed)
Immediate Anesthesia Transfer of Care Note  Patient: Cindy Fischer  Procedure(s) Performed: Procedure(s): COLONOSCOPY WITH PROPOFOL (N/A)  Patient Location: PACU and Endoscopy Unit  Anesthesia Type:General  Level of Consciousness: sedated  Airway & Oxygen Therapy: Patient Spontanous Breathing and Patient connected to nasal cannula oxygen  Post-op Assessment: Report given to RN and Post -op Vital signs reviewed and stable  Post vital signs: Reviewed and stable  Last Vitals:  Vitals:   09/04/16 0804 09/04/16 1020  BP: (!) 162/70 127/68  Pulse: 82 83  Resp: 14 17  Temp: 36.2 C (!) 16.9 C    Complications: No apparent anesthesia complications

## 2016-09-04 NOTE — Anesthesia Postprocedure Evaluation (Signed)
Anesthesia Post Note  Patient: Cindy Fischer  Procedure(s) Performed: Procedure(s) (LRB): COLONOSCOPY WITH PROPOFOL (N/A)  Patient location during evaluation: PACU Anesthesia Type: General Level of consciousness: awake and alert Pain management: pain level controlled Vital Signs Assessment: post-procedure vital signs reviewed and stable Respiratory status: spontaneous breathing, nonlabored ventilation, respiratory function stable and patient connected to nasal cannula oxygen Cardiovascular status: blood pressure returned to baseline and stable Postop Assessment: no signs of nausea or vomiting Anesthetic complications: no    Last Vitals:  Vitals:   09/04/16 1040 09/04/16 1050  BP: (!) 174/80 (!) 160/67  Pulse: 79 80  Resp: 13 18  Temp:      Last Pain:  Vitals:   09/04/16 1020  TempSrc: Tympanic                 Molli Barrows

## 2016-09-04 NOTE — Op Note (Signed)
[facility] Patient Name: Cindy Fischer Procedure Date: 09/04/2016 9:41 AM MRN: [id number] Account #: [id number] Date of Birth: 1946/05/28 Admit Type: Outpatient Age: 70 Room: [facility] ENDO ROOM 1 Gender: Female Note Status: Finalized Procedure:            Colonoscopy Indications:          Change in bowel habits Providers:            Arabell Neria G. Jamal Collin, MD Referring MD:         Leona Carry. Hall Busing, MD (Referring MD) Medicines:            General Anesthesia Complications:        No immediate complications. Procedure:            Pre-Anesthesia Assessment:                       - Using IV propofol under the supervision of an                        anesthesiologist was determined to be medically                        necessary for this procedure based on review of the                        patient's medical history, medications, and prior                        anesthesia history.                       After obtaining informed consent, the colonoscope was                        passed under direct vision. Throughout the procedure,                        the patient's blood pressure, pulse, and oxygen                        saturations were monitored continuously. The                        Colonoscope was introduced through the anus and                        advanced to the the cecum, identified by the ileocecal                        valve. The colonoscopy was performed without                        difficulty. The patient tolerated the procedure well.                        The quality of the bowel preparation was good. Findings:      The perianal exam findings include non-thrombosed external hemorrhoids.      A few sessile polyps were found in the rectum (benign-appearing lesion).       The polyps were 3 mm in size. These were biopsied with a cold  forceps       for histology.      Internal hemorrhoids were found during retroflexion. The hemorrhoids        were moderate and medium-sized.      The exam was otherwise without abnormality on direct and retroflexion       views. Impression:           - Non-thrombosed external hemorrhoids found on perianal                        exam.                       - A few benign appearing 3 mm polyps in the rectum.                        Biopsied.                       - Internal hemorrhoids.                       - The examination was otherwise normal on direct and                        retroflexion views. Recommendation:       - Return to my office in 2 weeks. Procedure Code(s):    --- Professional ---                       [phone], Colonoscopy, flexible; with biopsy, single or                        multiple Diagnosis Code(s):    --- Professional ---                       K62.1, Rectal polyp                       K64.4, Residual hemorrhoidal skin tags                       K64.8, Other hemorrhoids                       R19.4, Change in bowel habit CPT copyright 2016 American Medical Association. All rights reserved. The codes documented in this report are preliminary and upon coder review may  be revised to meet current compliance requirements. Christene Lye, MD 09/04/2016 10:20:08 AM This report has been signed electronically. Number of Addenda: 0 Note Initiated On: 09/04/2016 9:41 AM Scope Withdrawal Time: 0 hours 8 minutes 49 seconds  Total Procedure Duration: 0 hours 29 minutes 45 seconds       [facility]

## 2016-09-04 NOTE — Anesthesia Preprocedure Evaluation (Signed)
Anesthesia Evaluation  Patient identified by MRN, date of birth, ID band Patient awake    Reviewed: Allergy & Precautions, H&P , NPO status , Patient's Chart, lab work & pertinent test results, reviewed documented beta blocker date and time   Airway Mallampati: II   Neck ROM: full    Dental  (+) Poor Dentition   Pulmonary neg pulmonary ROS, former smoker,    Pulmonary exam normal        Cardiovascular negative cardio ROS Normal cardiovascular exam     Neuro/Psych negative neurological ROS  negative psych ROS   GI/Hepatic negative GI ROS, Neg liver ROS,   Endo/Other  negative endocrine ROSdiabetes, Well Controlled, Type 1  Renal/GU Renal diseasenegative Renal ROS  negative genitourinary   Musculoskeletal   Abdominal   Peds  Hematology negative hematology ROS (+)   Anesthesia Other Findings Past Medical History: 2016: Cancer ([place])     Comment: skin No date: Diabetes mellitus without complication ([place]) No date: Renal disorder Past Surgical History: 12/02/2010: COLONOSCOPY No date: INSERTION OF DIALYSIS CATHETER 10/20/2014: NEPHRECTOMY TRANSPLANTED ORGAN No date: TONSILLECTOMY No date: TUBAL LIGATION   Reproductive/Obstetrics                             Anesthesia Physical Anesthesia Plan  ASA: II  Anesthesia Plan: General   Post-op Pain Management:    Induction:   Airway Management Planned:   Additional Equipment:   Intra-op Plan:   Post-operative Plan:   Informed Consent: I have reviewed the patients History and Physical, chart, labs and discussed the procedure including the risks, benefits and alternatives for the proposed anesthesia with the patient or authorized representative who has indicated his/her understanding and acceptance.   Dental Advisory Given  Plan Discussed with: CRNA  Anesthesia Plan Comments:         Anesthesia Quick Evaluation

## 2016-09-04 NOTE — Anesthesia Procedure Notes (Signed)
Date/Time: 09/04/2016 9:39 AM Performed by: Doreen Salvage Pre-anesthesia Checklist: Patient identified, Emergency Drugs available, Suction available and Patient being monitored Patient Re-evaluated:Patient Re-evaluated prior to inductionOxygen Delivery Method: Nasal cannula Intubation Type: IV induction Dental Injury: Teeth and Oropharynx as per pre-operative assessment  Comments: Nasal cannula with etCO2 monitoring

## 2016-09-04 NOTE — Interval H&P Note (Signed)
History and Physical Interval Note:  09/04/2016 9:39 AM  Cindy Fischer  has presented today for surgery, with the diagnosis of CHANGE IN BOWEL HABITS  The various methods of treatment have been discussed with the patient and family. After consideration of risks, benefits and other options for treatment, the patient has consented to  Procedure(s): COLONOSCOPY WITH PROPOFOL (N/A) as a surgical intervention .  The patient's history has been reviewed, patient examined, no change in status, stable for surgery.  I have reviewed the patient's chart and labs.  Questions were answered to the patient's satisfaction.     SANKAR,SEEPLAPUTHUR G

## 2016-09-05 ENCOUNTER — Encounter: Payer: Self-pay | Admitting: General Surgery

## 2016-09-05 LAB — SURGICAL PATHOLOGY

## 2016-09-11 ENCOUNTER — Other Ambulatory Visit
Admission: RE | Admit: 2016-09-11 | Discharge: 2016-09-11 | Disposition: A | Discharge status: Home / Self Care | Payer: Medicare Other | Source: Ambulatory Visit | Attending: Nephrology | Admitting: Nephrology

## 2016-09-11 DIAGNOSIS — Z114 Encounter for screening for human immunodeficiency virus [HIV]: Secondary | ICD-10-CM | POA: Insufficient documentation

## 2016-09-11 DIAGNOSIS — N390 Urinary tract infection, site not specified: Secondary | ICD-10-CM | POA: Diagnosis not present

## 2016-09-11 DIAGNOSIS — E559 Vitamin D deficiency, unspecified: Secondary | ICD-10-CM | POA: Diagnosis not present

## 2016-09-11 DIAGNOSIS — Z789 Other specified health status: Secondary | ICD-10-CM | POA: Diagnosis not present

## 2016-09-11 DIAGNOSIS — X58XXXA Exposure to other specified factors, initial encounter: Secondary | ICD-10-CM | POA: Diagnosis not present

## 2016-09-11 DIAGNOSIS — D631 Anemia in chronic kidney disease: Secondary | ICD-10-CM | POA: Diagnosis not present

## 2016-09-11 DIAGNOSIS — Z09 Encounter for follow-up examination after completed treatment for conditions other than malignant neoplasm: Secondary | ICD-10-CM | POA: Diagnosis not present

## 2016-09-11 DIAGNOSIS — Z9483 Pancreas transplant status: Secondary | ICD-10-CM | POA: Diagnosis not present

## 2016-09-11 DIAGNOSIS — D899 Disorder involving the immune mechanism, unspecified: Secondary | ICD-10-CM | POA: Insufficient documentation

## 2016-09-11 DIAGNOSIS — E1129 Type 2 diabetes mellitus with other diabetic kidney complication: Secondary | ICD-10-CM | POA: Diagnosis not present

## 2016-09-11 DIAGNOSIS — T8610 Unspecified complication of kidney transplant: Secondary | ICD-10-CM | POA: Diagnosis not present

## 2016-09-11 DIAGNOSIS — Z79899 Other long term (current) drug therapy: Secondary | ICD-10-CM | POA: Diagnosis not present

## 2016-09-11 DIAGNOSIS — Z940 Kidney transplant status: Secondary | ICD-10-CM | POA: Diagnosis not present

## 2016-09-11 DIAGNOSIS — B259 Cytomegaloviral disease, unspecified: Secondary | ICD-10-CM | POA: Insufficient documentation

## 2016-09-11 DIAGNOSIS — N189 Chronic kidney disease, unspecified: Secondary | ICD-10-CM | POA: Insufficient documentation

## 2016-09-11 LAB — CBC WITH DIFFERENTIAL/PLATELET
BASOS PCT: 1 %
Basophils Absolute: 0 10*3/uL (ref 0–0.1)
Eosinophils Absolute: 0.1 10*3/uL (ref 0–0.7)
Eosinophils Relative: 1 %
HEMATOCRIT: 55.9 % — AB (ref 35.0–47.0)
HEMOGLOBIN: 18 g/dL — AB (ref 12.0–16.0)
LYMPHS ABS: 1.8 10*3/uL (ref 1.0–3.6)
Lymphocytes Relative: 20 %
MCH: 26.4 pg (ref 26.0–34.0)
MCHC: 32.3 g/dL (ref 32.0–36.0)
MCV: 81.9 fL (ref 80.0–100.0)
MONOS PCT: 12 %
Monocytes Absolute: 1 10*3/uL — ABNORMAL HIGH (ref 0.2–0.9)
NEUTROS ABS: 6 10*3/uL (ref 1.4–6.5)
NEUTROS PCT: 66 %
Platelets: 332 10*3/uL (ref 150–440)
RBC: 6.83 MIL/uL — AB (ref 3.80–5.20)
RDW: 16.4 % — ABNORMAL HIGH (ref 11.5–14.5)
WBC: 8.9 10*3/uL (ref 3.6–11.0)

## 2016-09-11 LAB — BASIC METABOLIC PANEL
Anion gap: 6 (ref 5–15)
BUN: 11 mg/dL (ref 6–20)
CHLORIDE: 102 mmol/L (ref 101–111)
CO2: 30 mmol/L (ref 22–32)
Calcium: 9.1 mg/dL (ref 8.9–10.3)
Creatinine, Ser: 0.7 mg/dL (ref 0.44–1.00)
GFR calc Af Amer: >60 mL/min (ref >60)
GFR calc non Af Amer: >60 mL/min (ref >60)
GLUCOSE: 114 mg/dL — AB (ref 65–99)
POTASSIUM: 4.1 mmol/L (ref 3.5–5.1)
SODIUM: 138 mmol/L (ref 135–145)

## 2016-09-11 LAB — MAGNESIUM: Magnesium: 1.7 mg/dL (ref 1.7–2.4)

## 2016-09-11 LAB — PHOSPHORUS: PHOSPHORUS: 3.3 mg/dL (ref 2.5–4.6)

## 2016-09-18 ENCOUNTER — Encounter: Payer: Self-pay | Admitting: General Surgery

## 2016-09-18 ENCOUNTER — Ambulatory Visit (INDEPENDENT_AMBULATORY_CARE_PROVIDER_SITE_OTHER): Payer: Medicare Other | Admitting: General Surgery

## 2016-09-18 VITALS — BP 130/76 | HR 78 | Resp 12 | Ht 64.0 in | Wt 164.0 lb

## 2016-09-18 DIAGNOSIS — K648 Other hemorrhoids: Secondary | ICD-10-CM

## 2016-09-18 DIAGNOSIS — K644 Residual hemorrhoidal skin tags: Secondary | ICD-10-CM

## 2016-09-18 NOTE — Progress Notes (Signed)
Patient ID: Cindy Fischer, female   DOB: Jan 19, 1946, 70 y.o.   MRN: [id number]  No chief complaint on file.   HPI Cindy Fischer is a 70 y.o. female here today for her post op colonoscopy done on 09/04/16. Patient states she is doing well.   I have reviewed the history of present illness with the patient.  HPI  Past Medical History:  Diagnosis Date  . Cancer ([place]) 2016   skin  . Diabetes mellitus without complication ([place])   . Hypothyroidism   . Renal disorder     Past Surgical History:  Procedure Laterality Date  . COLONOSCOPY  12/02/2010  . COLONOSCOPY WITH PROPOFOL N/A 09/04/2016   Procedure: COLONOSCOPY WITH PROPOFOL;  Surgeon: Christene Lye, MD;  Location: [facility] ENDOSCOPY;  Service: Endoscopy;  Laterality: N/A;  . INSERTION OF DIALYSIS CATHETER    . 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 TONSILLECTOMY    . TUBAL LIGATION      No family history on file.  Social History Social History  Substance Use Topics  . Smoking status: Former Research scientist (life sciences)  . Smokeless tobacco: Never Used  . Alcohol use No    Allergies  Allergen Reactions  . Oxycodone-Acetaminophen Nausea And Vomiting  . Penicillins Rash    Current Outpatient Prescriptions  Medication Sig Dispense Refill  . aspirin 81 MG chewable tablet Chew 81 mg by mouth daily.    Marland Kitchen atorvastatin (LIPITOR) 20 MG tablet Take 20 mg by mouth daily.    . cholecalciferol (VITAMIN D) 400 units TABS tablet Take 400 Units by mouth.    . Everolimus 0.5 MG TABS Take by mouth.    . Insulin NPH Human, Isophane, (NOVOLIN N [place]) Inject into the skin.    . Insulin Regular Human (NOVOLIN R IJ) Inject as directed.    Marland Kitchen levothyroxine (SYNTHROID, LEVOTHROID) 112 MCG tablet Take 112 mcg by mouth daily before breakfast.    . lisinopril (PRINIVIL,ZESTRIL) 5 MG tablet Take 5 mg by mouth daily.    . ranitidine (ZANTAC) 150 MG tablet Take 150 mg by mouth 2 (two) times daily.    . tacrolimus (PROGRAF) 1 MG capsule Take 1 mg by mouth 2  (two) times daily.     No current facility-administered medications for this visit.     Review of Systems Review of Systems  Blood pressure 130/76, pulse 78, resp. rate 12, height 5\' 4"  (1.626 m), weight 164 lb (74.4 kg).  Physical Exam Physical Exam  Data Reviewed Colonoscopy reviewed  Assessment    Discussion visit regarding the colonoscopy being normal except for findings of prominent internal hemorrhoid and fibrotic external hemorrhoid. Patient denies any rectal bleeding or symptoms.  Pt advised fully on the findings  Plan    Patient made aware of potential symptoms to watch out for. Follow up in 3-4 months or sooner if she becomes symptomatic.   This information has been scribed by Cindy Fischer CMA.       SANKAR,SEEPLAPUTHUR G 09/18/2016, 2:12 PM

## 2016-09-25 ENCOUNTER — Encounter: Payer: Self-pay | Admitting: General Surgery

## 2016-10-03 ENCOUNTER — Encounter: Payer: Self-pay | Admitting: General Surgery

## 2016-10-10 ENCOUNTER — Other Ambulatory Visit
Admission: RE | Admit: 2016-10-10 | Discharge: 2016-10-10 | Disposition: A | Discharge status: Home / Self Care | Payer: Medicare Other | Source: Ambulatory Visit | Attending: Nephrology | Admitting: Nephrology

## 2016-10-10 DIAGNOSIS — Z09 Encounter for follow-up examination after completed treatment for conditions other than malignant neoplasm: Secondary | ICD-10-CM | POA: Diagnosis not present

## 2016-10-10 DIAGNOSIS — D631 Anemia in chronic kidney disease: Secondary | ICD-10-CM | POA: Insufficient documentation

## 2016-10-10 DIAGNOSIS — Z9483 Pancreas transplant status: Secondary | ICD-10-CM | POA: Insufficient documentation

## 2016-10-10 DIAGNOSIS — E559 Vitamin D deficiency, unspecified: Secondary | ICD-10-CM | POA: Diagnosis not present

## 2016-10-10 DIAGNOSIS — E1129 Type 2 diabetes mellitus with other diabetic kidney complication: Secondary | ICD-10-CM | POA: Diagnosis not present

## 2016-10-10 DIAGNOSIS — Z789 Other specified health status: Secondary | ICD-10-CM | POA: Insufficient documentation

## 2016-10-10 DIAGNOSIS — Z940 Kidney transplant status: Secondary | ICD-10-CM | POA: Diagnosis not present

## 2016-10-10 DIAGNOSIS — D899 Disorder involving the immune mechanism, unspecified: Secondary | ICD-10-CM | POA: Insufficient documentation

## 2016-10-10 DIAGNOSIS — Z114 Encounter for screening for human immunodeficiency virus [HIV]: Secondary | ICD-10-CM | POA: Insufficient documentation

## 2016-10-10 DIAGNOSIS — B259 Cytomegaloviral disease, unspecified: Secondary | ICD-10-CM | POA: Diagnosis not present

## 2016-10-10 DIAGNOSIS — N390 Urinary tract infection, site not specified: Secondary | ICD-10-CM | POA: Diagnosis not present

## 2016-10-10 DIAGNOSIS — Z79899 Other long term (current) drug therapy: Secondary | ICD-10-CM | POA: Insufficient documentation

## 2016-10-10 LAB — CBC WITH DIFFERENTIAL/PLATELET
BASOS ABS: 0.1 10*3/uL (ref 0–0.1)
Basophils Relative: 1 %
Eosinophils Absolute: 0.1 10*3/uL (ref 0–0.7)
Eosinophils Relative: 2 %
HEMATOCRIT: 51.3 % — AB (ref 35.0–47.0)
HEMOGLOBIN: 16.9 g/dL — AB (ref 12.0–16.0)
LYMPHS PCT: 22 %
Lymphs Abs: 1.8 10*3/uL (ref 1.0–3.6)
MCH: 26.5 pg (ref 26.0–34.0)
MCHC: 32.9 g/dL (ref 32.0–36.0)
MCV: 80.5 fL (ref 80.0–100.0)
Monocytes Absolute: 1 10*3/uL — ABNORMAL HIGH (ref 0.2–0.9)
Monocytes Relative: 13 %
NEUTROS ABS: 5 10*3/uL (ref 1.4–6.5)
NEUTROS PCT: 62 %
PLATELETS: 429 10*3/uL (ref 150–440)
RBC: 6.37 MIL/uL — AB (ref 3.80–5.20)
RDW: 16.2 % — ABNORMAL HIGH (ref 11.5–14.5)
WBC: 8.1 10*3/uL (ref 3.6–11.0)

## 2016-10-10 LAB — BASIC METABOLIC PANEL
ANION GAP: 6 (ref 5–15)
BUN: 18 mg/dL (ref 6–20)
CHLORIDE: 102 mmol/L (ref 101–111)
CO2: 31 mmol/L (ref 22–32)
Calcium: 9.5 mg/dL (ref 8.9–10.3)
Creatinine, Ser: 0.68 mg/dL (ref 0.44–1.00)
GFR calc non Af Amer: >60 mL/min (ref >60)
Glucose, Bld: 94 mg/dL (ref 65–99)
Potassium: 4.7 mmol/L (ref 3.5–5.1)
Sodium: 139 mmol/L (ref 135–145)

## 2016-10-10 LAB — PHOSPHORUS: Phosphorus: 3.7 mg/dL (ref 2.5–4.6)

## 2016-10-10 LAB — MAGNESIUM: MAGNESIUM: 1.9 mg/dL (ref 1.7–2.4)

## 2016-12-04 ENCOUNTER — Other Ambulatory Visit
Admission: RE | Admit: 2016-12-04 | Discharge: 2016-12-04 | Disposition: A | Discharge status: Home / Self Care | Payer: Medicare Other | Source: Ambulatory Visit | Attending: Nephrology | Admitting: Nephrology

## 2016-12-04 DIAGNOSIS — N390 Urinary tract infection, site not specified: Secondary | ICD-10-CM | POA: Insufficient documentation

## 2016-12-04 DIAGNOSIS — B259 Cytomegaloviral disease, unspecified: Secondary | ICD-10-CM | POA: Diagnosis not present

## 2016-12-04 DIAGNOSIS — Z09 Encounter for follow-up examination after completed treatment for conditions other than malignant neoplasm: Secondary | ICD-10-CM | POA: Insufficient documentation

## 2016-12-04 DIAGNOSIS — D631 Anemia in chronic kidney disease: Secondary | ICD-10-CM | POA: Insufficient documentation

## 2016-12-04 DIAGNOSIS — T8610 Unspecified complication of kidney transplant: Secondary | ICD-10-CM | POA: Insufficient documentation

## 2016-12-04 DIAGNOSIS — E559 Vitamin D deficiency, unspecified: Secondary | ICD-10-CM | POA: Diagnosis not present

## 2016-12-04 DIAGNOSIS — Z9483 Pancreas transplant status: Secondary | ICD-10-CM | POA: Insufficient documentation

## 2016-12-04 DIAGNOSIS — E1129 Type 2 diabetes mellitus with other diabetic kidney complication: Secondary | ICD-10-CM | POA: Diagnosis not present

## 2016-12-04 DIAGNOSIS — D899 Disorder involving the immune mechanism, unspecified: Secondary | ICD-10-CM | POA: Insufficient documentation

## 2016-12-04 DIAGNOSIS — Z114 Encounter for screening for human immunodeficiency virus [HIV]: Secondary | ICD-10-CM | POA: Insufficient documentation

## 2016-12-04 DIAGNOSIS — Z940 Kidney transplant status: Secondary | ICD-10-CM | POA: Insufficient documentation

## 2016-12-04 DIAGNOSIS — N189 Chronic kidney disease, unspecified: Secondary | ICD-10-CM | POA: Insufficient documentation

## 2016-12-04 DIAGNOSIS — Z789 Other specified health status: Secondary | ICD-10-CM | POA: Insufficient documentation

## 2016-12-04 DIAGNOSIS — Z79899 Other long term (current) drug therapy: Secondary | ICD-10-CM | POA: Insufficient documentation

## 2016-12-04 LAB — CBC WITH DIFFERENTIAL/PLATELET
Basophils Absolute: 0.1 10*3/uL (ref 0–0.1)
Basophils Relative: 1 %
Eosinophils Absolute: 0.1 10*3/uL (ref 0–0.7)
Eosinophils Relative: 2 %
HCT: 45.4 % (ref 35.0–47.0)
Hemoglobin: 15.1 g/dL (ref 12.0–16.0)
Lymphocytes Relative: 26 %
Lymphs Abs: 2.2 10*3/uL (ref 1.0–3.6)
MCH: 27.2 pg (ref 26.0–34.0)
MCHC: 33.2 g/dL (ref 32.0–36.0)
MCV: 81.7 fL (ref 80.0–100.0)
Monocytes Absolute: 1 10*3/uL — ABNORMAL HIGH (ref 0.2–0.9)
Monocytes Relative: 13 %
Neutro Abs: 4.9 10*3/uL (ref 1.4–6.5)
Neutrophils Relative %: 58 %
Platelets: 438 10*3/uL (ref 150–440)
RBC: 5.56 MIL/uL — ABNORMAL HIGH (ref 3.80–5.20)
RDW: 17.6 % — ABNORMAL HIGH (ref 11.5–14.5)
WBC: 8.3 10*3/uL (ref 3.6–11.0)

## 2016-12-04 LAB — BASIC METABOLIC PANEL
Anion gap: 9 (ref 5–15)
BUN: 14 mg/dL (ref 6–20)
CHLORIDE: 101 mmol/L (ref 101–111)
CO2: 26 mmol/L (ref 22–32)
Calcium: 9.4 mg/dL (ref 8.9–10.3)
Creatinine, Ser: 0.81 mg/dL (ref 0.44–1.00)
GFR calc Af Amer: >60 mL/min (ref >60)
GFR calc non Af Amer: >60 mL/min (ref >60)
GLUCOSE: 104 mg/dL — AB (ref 65–99)
POTASSIUM: 4.7 mmol/L (ref 3.5–5.1)
SODIUM: 136 mmol/L (ref 135–145)

## 2016-12-04 LAB — PHOSPHORUS: Phosphorus: 3.6 mg/dL (ref 2.5–4.6)

## 2016-12-04 LAB — MAGNESIUM: Magnesium: 1.8 mg/dL (ref 1.7–2.4)

## 2016-12-20 ENCOUNTER — Encounter: Payer: Self-pay | Admitting: General Surgery

## 2016-12-20 ENCOUNTER — Ambulatory Visit (INDEPENDENT_AMBULATORY_CARE_PROVIDER_SITE_OTHER): Payer: Medicare Other | Admitting: General Surgery

## 2016-12-20 VITALS — BP 136/60 | HR 66 | Resp 12 | Ht 64.0 in | Wt 160.0 lb

## 2016-12-20 DIAGNOSIS — K644 Residual hemorrhoidal skin tags: Secondary | ICD-10-CM | POA: Diagnosis not present

## 2016-12-20 NOTE — Progress Notes (Signed)
Patient ID: Cindy Fischer, female   DOB: [birth year], 70 y.o.   MRN: [id number]  Chief Complaint  Patient presents with  . Follow-up    hemorrhoids    HPI Cindy Fischer is a 70 y.o. female here today for her three month follow up colonoscopy and hemorrhoids. Bowels move daily and no bleeding or pain. She only notices the hemorrhoids when she wipes after a BM. I have reviewed the history of present illness with the patient.  HPI  Past Medical History:  Diagnosis Date  . Cancer ([place]) 2016   skin  . Diabetes mellitus without complication ([place])   . Hypothyroidism   . Renal disorder     Past Surgical History:  Procedure Laterality Date  . COLONOSCOPY  12/02/2010  . COLONOSCOPY WITH PROPOFOL N/A 09/04/2016   Procedure: COLONOSCOPY WITH PROPOFOL;  Surgeon: Christene Lye, MD;  Location: [facility] ENDOSCOPY;  Service: Endoscopy;  Laterality: N/A;  . INSERTION OF DIALYSIS CATHETER    . 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 TONSILLECTOMY    . TUBAL LIGATION      No family history on file.  Social History Social History  Substance Use Topics  . Smoking status: Former Research scientist (life sciences)  . Smokeless tobacco: Never Used  . Alcohol use No    Allergies  Allergen Reactions  . Oxycodone-Acetaminophen Nausea And Vomiting  . Penicillins Rash    Current Outpatient Prescriptions  Medication Sig Dispense Refill  . aspirin 81 MG chewable tablet Chew 81 mg by mouth daily.    Marland Kitchen atorvastatin (LIPITOR) 20 MG tablet Take 20 mg by mouth daily.    . cholecalciferol (VITAMIN D) 400 units TABS tablet Take 400 Units by mouth.    . Everolimus 0.5 MG TABS Take by mouth.    . Insulin NPH Human, Isophane, (NOVOLIN N [place]) Inject into the skin.    . Insulin Regular Human (NOVOLIN R IJ) Inject as directed.    Marland Kitchen levothyroxine (SYNTHROID, LEVOTHROID) 112 MCG tablet Take 112 mcg by mouth daily before breakfast.    . lisinopril (PRINIVIL,ZESTRIL) 5 MG tablet Take 5 mg by mouth daily.    . ranitidine  (ZANTAC) 150 MG tablet Take 150 mg by mouth 2 (two) times daily.    . tacrolimus (PROGRAF) 1 MG capsule Take 1 mg by mouth 2 (two) times daily.     No current facility-administered medications for this visit.     Review of Systems Review of Systems  Constitutional: Negative.   Respiratory: Negative.   Cardiovascular: Negative.   Psychiatric/Behavioral: Confusion: .asasc.    Blood pressure 136/60, pulse 66, resp. rate 12, height 5\' 4"  (1.626 m), weight 160 lb (72.6 kg).  Physical Exam Physical Exam  Constitutional: She is oriented to person, place, and time. She appears well-developed and well-nourished.  Pulmonary/Chest: Effort normal and breath sounds normal.  Genitourinary:     Neurological: She is alert and oriented to person, place, and time.  Skin: Skin is warm and dry.    Data Reviewed Prior notes reviewed   Assessment    1 cm fibrotic external hemorrhoid, no prolapsing internal hemorrhoids. Pt is not symptomatic.    Plan    Follow up as needed or if she develops symptoms of pain or increased swelling The patient is aware to call back for any questions or concerns.     This information has been scribed by Karie Fetch RN, BSN,BC. Marland Kitchen    SANKAR,SEEPLAPUTHUR G 12/20/2016, 1:06 PM

## 2016-12-20 NOTE — Patient Instructions (Signed)
Follow up as needed or if symptoms worsen. The patient is aware to call back for any questions or concerns.

## 2017-01-04 ENCOUNTER — Other Ambulatory Visit
Admission: RE | Admit: 2017-01-04 | Discharge: 2017-01-04 | Disposition: A | Discharge status: Home / Self Care | Payer: Medicare Other | Source: Ambulatory Visit | Attending: Nephrology | Admitting: Nephrology

## 2017-01-04 DIAGNOSIS — Z789 Other specified health status: Secondary | ICD-10-CM | POA: Insufficient documentation

## 2017-01-04 DIAGNOSIS — E1129 Type 2 diabetes mellitus with other diabetic kidney complication: Secondary | ICD-10-CM | POA: Insufficient documentation

## 2017-01-04 DIAGNOSIS — Z940 Kidney transplant status: Secondary | ICD-10-CM | POA: Insufficient documentation

## 2017-01-04 DIAGNOSIS — Z79899 Other long term (current) drug therapy: Secondary | ICD-10-CM | POA: Insufficient documentation

## 2017-01-04 DIAGNOSIS — N189 Chronic kidney disease, unspecified: Secondary | ICD-10-CM | POA: Insufficient documentation

## 2017-01-04 DIAGNOSIS — D631 Anemia in chronic kidney disease: Secondary | ICD-10-CM | POA: Insufficient documentation

## 2017-01-04 DIAGNOSIS — Z9483 Pancreas transplant status: Secondary | ICD-10-CM | POA: Diagnosis not present

## 2017-01-04 DIAGNOSIS — B259 Cytomegaloviral disease, unspecified: Secondary | ICD-10-CM | POA: Insufficient documentation

## 2017-01-04 DIAGNOSIS — N390 Urinary tract infection, site not specified: Secondary | ICD-10-CM | POA: Insufficient documentation

## 2017-01-04 DIAGNOSIS — Z114 Encounter for screening for human immunodeficiency virus [HIV]: Secondary | ICD-10-CM | POA: Diagnosis not present

## 2017-01-04 DIAGNOSIS — E559 Vitamin D deficiency, unspecified: Secondary | ICD-10-CM | POA: Insufficient documentation

## 2017-01-04 DIAGNOSIS — Z09 Encounter for follow-up examination after completed treatment for conditions other than malignant neoplasm: Secondary | ICD-10-CM | POA: Insufficient documentation

## 2017-01-04 DIAGNOSIS — D899 Disorder involving the immune mechanism, unspecified: Secondary | ICD-10-CM | POA: Diagnosis present

## 2017-01-04 LAB — BASIC METABOLIC PANEL
ANION GAP: 8 (ref 5–15)
BUN: 11 mg/dL (ref 6–20)
CHLORIDE: 103 mmol/L (ref 101–111)
CO2: 27 mmol/L (ref 22–32)
Calcium: 9.3 mg/dL (ref 8.9–10.3)
Creatinine, Ser: 0.79 mg/dL (ref 0.44–1.00)
GFR calc non Af Amer: >60 mL/min (ref >60)
Glucose, Bld: 102 mg/dL — ABNORMAL HIGH (ref 65–99)
POTASSIUM: 4.7 mmol/L (ref 3.5–5.1)
Sodium: 138 mmol/L (ref 135–145)

## 2017-01-04 LAB — CBC WITH DIFFERENTIAL/PLATELET
BASOS ABS: 0.1 10*3/uL (ref 0–0.1)
BASOS PCT: 1 %
Eosinophils Absolute: 0.1 10*3/uL (ref 0–0.7)
Eosinophils Relative: 1 %
HEMATOCRIT: 42.6 % (ref 35.0–47.0)
Hemoglobin: 14.3 g/dL (ref 12.0–16.0)
Lymphocytes Relative: 24 %
Lymphs Abs: 2.2 10*3/uL (ref 1.0–3.6)
MCH: 28.3 pg (ref 26.0–34.0)
MCHC: 33.6 g/dL (ref 32.0–36.0)
MCV: 84.1 fL (ref 80.0–100.0)
MONOS PCT: 10 %
Monocytes Absolute: 0.9 10*3/uL (ref 0.2–0.9)
Neutro Abs: 5.7 10*3/uL (ref 1.4–6.5)
Neutrophils Relative %: 64 %
Platelets: 504 10*3/uL — ABNORMAL HIGH (ref 150–440)
RBC: 5.07 MIL/uL (ref 3.80–5.20)
RDW: 16.6 % — ABNORMAL HIGH (ref 11.5–14.5)
WBC: 9 10*3/uL (ref 3.6–11.0)

## 2017-01-04 LAB — PHOSPHORUS: Phosphorus: 3.2 mg/dL (ref 2.5–4.6)

## 2017-01-04 LAB — MAGNESIUM: MAGNESIUM: 1.8 mg/dL (ref 1.7–2.4)

## 2017-01-22 ENCOUNTER — Other Ambulatory Visit: Payer: Self-pay | Admitting: Internal Medicine

## 2017-01-22 DIAGNOSIS — Z1231 Encounter for screening mammogram for malignant neoplasm of breast: Secondary | ICD-10-CM

## 2017-02-06 ENCOUNTER — Ambulatory Visit
Admission: RE | Admit: 2017-02-06 | Discharge: 2017-02-06 | Disposition: A | Discharge status: Home / Self Care | Payer: Medicare Other | Source: Ambulatory Visit | Attending: Internal Medicine | Admitting: Internal Medicine

## 2017-02-06 ENCOUNTER — Other Ambulatory Visit
Admission: RE | Admit: 2017-02-06 | Discharge: 2017-02-06 | Disposition: A | Discharge status: Home / Self Care | Payer: Medicare Other | Source: Ambulatory Visit | Attending: Nephrology | Admitting: Nephrology

## 2017-02-06 DIAGNOSIS — Z794 Long term (current) use of insulin: Secondary | ICD-10-CM | POA: Diagnosis not present

## 2017-02-06 DIAGNOSIS — Z1231 Encounter for screening mammogram for malignant neoplasm of breast: Secondary | ICD-10-CM | POA: Diagnosis present

## 2017-02-06 DIAGNOSIS — Z79899 Other long term (current) drug therapy: Secondary | ICD-10-CM | POA: Diagnosis not present

## 2017-02-06 DIAGNOSIS — Z7982 Long term (current) use of aspirin: Secondary | ICD-10-CM | POA: Insufficient documentation

## 2017-02-06 LAB — MAGNESIUM: Magnesium: 1.8 mg/dL (ref 1.7–2.4)

## 2017-02-06 LAB — LIPID PANEL
CHOLESTEROL: 149 mg/dL (ref 0–200)
HDL: 72 mg/dL (ref >40)
LDL Cholesterol: 54 mg/dL (ref 0–99)
TRIGLYCERIDES: 116 mg/dL (ref <150)
Total CHOL/HDL Ratio: 2.1 RATIO
VLDL: 23 mg/dL (ref 0–40)

## 2017-02-06 LAB — CBC WITH DIFFERENTIAL/PLATELET
BASOS PCT: 1 %
Basophils Absolute: 0.1 10*3/uL (ref 0–0.1)
EOS ABS: 0.1 10*3/uL (ref 0–0.7)
EOS PCT: 1 %
HCT: 43.6 % (ref 35.0–47.0)
HEMOGLOBIN: 14.4 g/dL (ref 12.0–16.0)
Lymphocytes Relative: 27 %
Lymphs Abs: 2.1 10*3/uL (ref 1.0–3.6)
MCH: 29 pg (ref 26.0–34.0)
MCHC: 33.1 g/dL (ref 32.0–36.0)
MCV: 87.6 fL (ref 80.0–100.0)
Monocytes Absolute: 0.9 10*3/uL (ref 0.2–0.9)
Monocytes Relative: 12 %
NEUTROS PCT: 59 %
Neutro Abs: 4.5 10*3/uL (ref 1.4–6.5)
PLATELETS: 527 10*3/uL — AB (ref 150–440)
RBC: 4.97 MIL/uL (ref 3.80–5.20)
RDW: 15.1 % — AB (ref 11.5–14.5)
WBC: 7.8 10*3/uL (ref 3.6–11.0)

## 2017-02-06 LAB — BASIC METABOLIC PANEL
ANION GAP: 7 (ref 5–15)
BUN: 12 mg/dL (ref 6–20)
CALCIUM: 9.1 mg/dL (ref 8.9–10.3)
CO2: 29 mmol/L (ref 22–32)
Chloride: 102 mmol/L (ref 101–111)
Creatinine, Ser: 0.82 mg/dL (ref 0.44–1.00)
GFR calc Af Amer: >60 mL/min (ref >60)
Glucose, Bld: 110 mg/dL — ABNORMAL HIGH (ref 65–99)
POTASSIUM: 4.1 mmol/L (ref 3.5–5.1)
SODIUM: 138 mmol/L (ref 135–145)

## 2017-02-06 LAB — BILIRUBIN, TOTAL: BILIRUBIN TOTAL: 0.5 mg/dL (ref 0.3–1.2)

## 2017-02-06 LAB — PHOSPHORUS: PHOSPHORUS: 3.4 mg/dL (ref 2.5–4.6)

## 2017-02-06 LAB — ALKALINE PHOSPHATASE: ALK PHOS: 62 U/L (ref 38–126)

## 2017-02-06 LAB — GAMMA GT: GGT: 17 U/L (ref 7–50)

## 2017-02-06 LAB — ALBUMIN: ALBUMIN: 3.7 g/dL (ref 3.5–5.0)

## 2017-02-06 LAB — ALT: ALT: 23 U/L (ref 14–54)

## 2017-02-06 LAB — AST: AST: 26 U/L (ref 15–41)

## 2017-03-07 ENCOUNTER — Other Ambulatory Visit
Admission: RE | Admit: 2017-03-07 | Discharge: 2017-03-07 | Disposition: A | Discharge status: Home / Self Care | Payer: Medicare Other | Source: Ambulatory Visit | Attending: Nephrology | Admitting: Nephrology

## 2017-03-07 DIAGNOSIS — D899 Disorder involving the immune mechanism, unspecified: Secondary | ICD-10-CM | POA: Insufficient documentation

## 2017-03-07 DIAGNOSIS — N189 Chronic kidney disease, unspecified: Secondary | ICD-10-CM | POA: Diagnosis not present

## 2017-03-07 DIAGNOSIS — Z114 Encounter for screening for human immunodeficiency virus [HIV]: Secondary | ICD-10-CM | POA: Diagnosis not present

## 2017-03-07 DIAGNOSIS — Z9483 Pancreas transplant status: Secondary | ICD-10-CM | POA: Diagnosis not present

## 2017-03-07 DIAGNOSIS — Z940 Kidney transplant status: Secondary | ICD-10-CM | POA: Insufficient documentation

## 2017-03-07 DIAGNOSIS — Z09 Encounter for follow-up examination after completed treatment for conditions other than malignant neoplasm: Secondary | ICD-10-CM | POA: Diagnosis not present

## 2017-03-07 DIAGNOSIS — E559 Vitamin D deficiency, unspecified: Secondary | ICD-10-CM | POA: Diagnosis not present

## 2017-03-07 DIAGNOSIS — Z789 Other specified health status: Secondary | ICD-10-CM | POA: Diagnosis not present

## 2017-03-07 DIAGNOSIS — Z79899 Other long term (current) drug therapy: Secondary | ICD-10-CM | POA: Diagnosis not present

## 2017-03-07 DIAGNOSIS — B259 Cytomegaloviral disease, unspecified: Secondary | ICD-10-CM | POA: Insufficient documentation

## 2017-03-07 DIAGNOSIS — E1129 Type 2 diabetes mellitus with other diabetic kidney complication: Secondary | ICD-10-CM | POA: Diagnosis not present

## 2017-03-07 LAB — HEPATIC FUNCTION PANEL
ALT: 20 U/L (ref 14–54)
AST: 24 U/L (ref 15–41)
Albumin: 3.9 g/dL (ref 3.5–5.0)
Alkaline Phosphatase: 62 U/L (ref 38–126)
Bilirubin, Direct: 0.2 mg/dL (ref 0.1–0.5)
Indirect Bilirubin: 0.4 mg/dL (ref 0.3–0.9)
Total Bilirubin: 0.6 mg/dL (ref 0.3–1.2)
Total Protein: 6.6 g/dL (ref 6.5–8.1)

## 2017-03-07 LAB — BASIC METABOLIC PANEL
Anion gap: 6 (ref 5–15)
BUN: 12 mg/dL (ref 6–20)
CO2: 29 mmol/L (ref 22–32)
Calcium: 9.1 mg/dL (ref 8.9–10.3)
Chloride: 101 mmol/L (ref 101–111)
Creatinine, Ser: 0.84 mg/dL (ref 0.44–1.00)
GFR calc Af Amer: >60 mL/min (ref >60)
GFR calc non Af Amer: >60 mL/min (ref >60)
Glucose, Bld: 96 mg/dL (ref 65–99)
Potassium: 3.8 mmol/L (ref 3.5–5.1)
Sodium: 136 mmol/L (ref 135–145)

## 2017-03-07 LAB — CBC WITH DIFFERENTIAL/PLATELET
BASOS ABS: 0.1 10*3/uL (ref 0–0.1)
Basophils Relative: 1 %
EOS PCT: 1 %
Eosinophils Absolute: 0.1 10*3/uL (ref 0–0.7)
HEMATOCRIT: 44.1 % (ref 35.0–47.0)
HEMOGLOBIN: 14.8 g/dL (ref 12.0–16.0)
LYMPHS ABS: 1.8 10*3/uL (ref 1.0–3.6)
Lymphocytes Relative: 22 %
MCH: 29.2 pg (ref 26.0–34.0)
MCHC: 33.5 g/dL (ref 32.0–36.0)
MCV: 87.2 fL (ref 80.0–100.0)
Monocytes Absolute: 0.9 10*3/uL (ref 0.2–0.9)
Monocytes Relative: 11 %
NEUTROS PCT: 65 %
Neutro Abs: 5.1 10*3/uL (ref 1.4–6.5)
PLATELETS: 508 10*3/uL — AB (ref 150–440)
RBC: 5.06 MIL/uL (ref 3.80–5.20)
RDW: 14.5 % (ref 11.5–14.5)
WBC: 8 10*3/uL (ref 3.6–11.0)

## 2017-03-07 LAB — LIPID PANEL
Cholesterol: 154 mg/dL (ref 0–200)
HDL: 73 mg/dL (ref >40)
LDL CALC: 62 mg/dL (ref 0–99)
TRIGLYCERIDES: 97 mg/dL (ref <150)
Total CHOL/HDL Ratio: 2.1 RATIO
VLDL: 19 mg/dL (ref 0–40)

## 2017-03-07 LAB — PHOSPHORUS: Phosphorus: 2.9 mg/dL (ref 2.5–4.6)

## 2017-03-07 LAB — MAGNESIUM: Magnesium: 1.8 mg/dL (ref 1.7–2.4)

## 2017-03-07 LAB — GAMMA GT: GGT: 17 U/L (ref 7–50)

## 2017-03-24 ENCOUNTER — Ambulatory Visit
Admission: RE | Admit: 2017-03-24 | Discharge: 2017-03-24 | Disposition: A | Discharge status: Home / Self Care | Payer: Medicare Other | Source: Ambulatory Visit | Attending: Physician Assistant | Admitting: Physician Assistant

## 2017-03-24 ENCOUNTER — Ambulatory Visit
Admission: EM | Admit: 2017-03-24 | Discharge: 2017-03-24 | Disposition: A | Discharge status: Home / Self Care | Payer: Medicare Other | Attending: Family Medicine | Admitting: Family Medicine

## 2017-03-24 ENCOUNTER — Encounter: Payer: Self-pay | Admitting: Emergency Medicine

## 2017-03-24 DIAGNOSIS — T148XXA Other injury of unspecified body region, initial encounter: Secondary | ICD-10-CM | POA: Diagnosis present

## 2017-03-24 DIAGNOSIS — I82712 Chronic embolism and thrombosis of superficial veins of left upper extremity: Secondary | ICD-10-CM | POA: Insufficient documentation

## 2017-03-24 DIAGNOSIS — S40022A Contusion of left upper arm, initial encounter: Secondary | ICD-10-CM

## 2017-03-24 DIAGNOSIS — X58XXXA Exposure to other specified factors, initial encounter: Secondary | ICD-10-CM | POA: Diagnosis not present

## 2017-03-24 DIAGNOSIS — M79602 Pain in left arm: Secondary | ICD-10-CM | POA: Diagnosis not present

## 2017-03-24 NOTE — ED Triage Notes (Signed)
Unexplained bruise on left arm. Pain, swollen for 3 days

## 2017-03-24 NOTE — Discharge Instructions (Signed)
-  Procede to radiology for ultrasound of arm to rule out clot -May use Tylenol for pain as needed -Follow up with PCP or nephrologist regarding the bruising this week -return to clinic or ER should bruise continue to worsen, you develop chest pain, shortness of breath

## 2017-03-24 NOTE — ED Provider Notes (Signed)
[facility] ____________________________________________  Time seen: Approximately 11:12 AM  I have reviewed the triage vital signs and the nursing notes.   HISTORY  Chief Complaint bruise   HPI Cindy Fischer is a 71 y.o. female with a past history of diabetes, hypothyroidism, and end-stage renal disease status post kidney transplant in 2015. Patient presents with complaint of bruising to her left upper arm. Patient reports she took her blood pressure Thursday night with her wrist cuff as normal. She states it felt like her vein "popped out and was hard". Patient reports the left upper arm bruising has developed since then and has been worsening. Patient reports it was hurting earlier today with pain radiating up to her neck.  Patient does have a old fistula on the medial side of her left arm just above the elbow. Patient reports this fistula was last use about 2 and half years ago for dialysis but has not been used since due to clotting off. Denies chest pain, shortness of breath, abdominal pain, dysuria, extremity pain, extremity swelling or rash. Denies recent sickness. Denies recent antibiotic use.    Past Medical History:  Diagnosis Date  . Cancer ([place]) 2016   skin  . Diabetes mellitus without complication ([place])   . Hypothyroidism   . Renal disorder     There are no active problems to display for this patient.   Past Surgical History:  Procedure Laterality Date  . COLONOSCOPY  12/02/2010  . COLONOSCOPY WITH PROPOFOL N/A 09/04/2016   Procedure: COLONOSCOPY WITH PROPOFOL;  Surgeon: Christene Lye, MD;  Location: [facility] ENDOSCOPY;  Service: Endoscopy;  Laterality: N/A;  . INSERTION OF DIALYSIS CATHETER    . 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 TONSILLECTOMY    . TUBAL LIGATION       No current facility-administered medications for this encounter.   Current Outpatient Prescriptions:  .  aspirin 81 MG chewable tablet, Chew 81 mg by mouth  daily., Disp: , Rfl:  .  atorvastatin (LIPITOR) 20 MG tablet, Take 20 mg by mouth daily., Disp: , Rfl:  .  cholecalciferol (VITAMIN D) 400 units TABS tablet, Take 400 Units by mouth., Disp: , Rfl:  .  Everolimus 0.5 MG TABS, Take by mouth., Disp: , Rfl:  .  Insulin NPH Human, Isophane, (NOVOLIN N [place]), Inject into the skin., Disp: , Rfl:  .  Insulin Regular Human (NOVOLIN R IJ), Inject as directed., Disp: , Rfl:  .  levothyroxine (SYNTHROID, LEVOTHROID) 112 MCG tablet, Take 112 mcg by mouth daily before breakfast., Disp: , Rfl:  .  lisinopril (PRINIVIL,ZESTRIL) 5 MG tablet, Take 5 mg by mouth daily., Disp: , Rfl:  .  ranitidine (ZANTAC) 150 MG tablet, Take 150 mg by mouth 2 (two) times daily., Disp: , Rfl:  .  tacrolimus (PROGRAF) 1 MG capsule, Take 1 mg by mouth 2 (two) times daily., Disp: , Rfl:   Allergies Oxycodone-acetaminophen and Penicillins  Family History  Problem Relation Age of Onset  . Breast cancer Neg Hx     Social History Social History  Substance Use Topics  . Smoking status: Former Research scientist (life sciences)  . Smokeless tobacco: Never Used  . Alcohol use No    Review of Systems Constitutional: No fever/chills Eyes: No visual changes. ENT: No sore throat. Cardiovascular: Denies chest pain. Respiratory: Denies shortness of breath. Gastrointestinal: No abdominal pain.  No nausea, no vomiting.  No diarrhea.  No constipation. Genitourinary: Negative for dysuria. Musculoskeletal: Bruising to left arm as above. Skin:  Negative for rash. Neurological: Negative for headaches, focal weakness or numbness.  10-point ROS otherwise negative.  ____________________________________________   PHYSICAL EXAM:  VITAL SIGNS: ED Triage Vitals  Enc Vitals Group     BP 03/24/17 1039 (!) 148/81     Pulse Rate 03/24/17 1039 70     Resp 03/24/17 1039 16     Temp 03/24/17 1039 98.3 F (36.8 C)     Temp Source 03/24/17 1039 Oral     SpO2 03/24/17 1039 100 %     Weight 03/24/17 1039 160 lb (72.6  kg)     Height 03/24/17 1039 5\' 2"  (1.575 m)     Head Circumference --      Peak Flow --      Pain Score 03/24/17 1042 2     Pain Loc --      Pain Edu? --      Excl. in [place]? --     Constitutional: Alert and oriented. Well appearing and in no acute distress. Eyes: Conjunctivae are normal. PERRL. EOMI. ENT      Head: Normocephalic and atraumatic.      Nose: No congestion/rhinnorhea.      Mouth/Throat: Mucous membranes are moist.Oropharynx non-erythematous. Neck: No stridor. Supple without meningismus.  Hematological/Lymphatic/Immunilogical: No cervical lymphadenopathy. Cardiovascular: Normal rate, regular rhythm. Grossly normal heart sounds.  Good peripheral circulation. Respiratory: Normal respiratory effort without tachypnea nor retractions. Breath sounds are clear and equal bilaterally. No wheezes, rales, rhonchi. Gastrointestinal: Soft and nontender. No distention. Normal Bowel sounds. No CVA tenderness. Musculoskeletal:  Bruise to left upper extremity, approximately 3 inches in diameter with purple coloration mostly to the outside of the bruise. Left upper extremity fistula just above the elbow medially with noted strong pulse but no thrill. Site of old graft to right wrist with no thrill or pulse, patient reports vein had been ligated off.       Right lower leg:  No tenderness or edema.      Left lower leg:  No tenderness or edema.  Neurologic:  Normal speech and language. No gross focal neurologic deficits are appreciated. Speech is normal. No gait instability.  Skin:  Skin is warm, dry and intact.  Bruise as noted above Psychiatric: Mood and affect are normal. Speech and behavior are normal. Patient exhibits appropriate insight and judgment   ___________________________________________   LABS (all labs ordered are listed, but only abnormal results are displayed)  Labs Reviewed - No data to display _ PROCEDURES Procedures :  None  ____________________________________________   INITIAL IMPRESSION / ASSESSMENT AND PLAN / ED COURSE  Pertinent labs & imaging results that were available during my care of the patient were reviewed by me and considered in my medical decision making (see chart for details).  Patient presents with left upper arm bruise that has worsened over the last couple days. Patient reports she felt her vein "pop out and get hard" after taking a risk blood pressure Thursday evening. Patient has a left upper arm fistula with pulse but no thrill that has clotted in the past. With that history and complaint of radiating pain this morning will send the patient to radiology for an ultrasound.   ____________________________________________   FINAL CLINICAL IMPRESSION(S) / ED DIAGNOSES  Final diagnoses:  Arm bruise, left, initial encounter  Left arm pain     Discharge Medication List as of 03/24/2017 11:50 AM     Radiology was contacted and I can see the patient today to get this ultrasound completed.  Advised patient of concerns for possible clots given bruising and clot history with her fistula in the radiating pain she had this morning. Patient to go from here to radiology complete ultrasound. Advised her to return to the ER should she develop signs of shortness of breath chest pain or worsening of her symptoms. Patient also advised to follow-up with her primary care physician or nephrologist in regards to this bruising and the association with her fistula. Patient verbalized understanding and is agreeable with the plan.  Note: This dictation was prepared with Dragon dictation along with smaller phrase technology. Any transcriptional errors that result from this process are unintentional.         Luvenia Redden, PA-C 03/24/17 1207

## 2017-04-09 ENCOUNTER — Other Ambulatory Visit
Admission: RE | Admit: 2017-04-09 | Discharge: 2017-04-09 | Disposition: A | Discharge status: Home / Self Care | Payer: Medicare Other | Source: Ambulatory Visit | Attending: Nephrology | Admitting: Nephrology

## 2017-04-09 DIAGNOSIS — E1129 Type 2 diabetes mellitus with other diabetic kidney complication: Secondary | ICD-10-CM | POA: Diagnosis not present

## 2017-04-09 DIAGNOSIS — Z9483 Pancreas transplant status: Secondary | ICD-10-CM | POA: Insufficient documentation

## 2017-04-09 DIAGNOSIS — Z09 Encounter for follow-up examination after completed treatment for conditions other than malignant neoplasm: Secondary | ICD-10-CM | POA: Diagnosis not present

## 2017-04-09 DIAGNOSIS — Z940 Kidney transplant status: Secondary | ICD-10-CM | POA: Diagnosis present

## 2017-04-09 DIAGNOSIS — Z79899 Other long term (current) drug therapy: Secondary | ICD-10-CM | POA: Insufficient documentation

## 2017-04-09 DIAGNOSIS — D631 Anemia in chronic kidney disease: Secondary | ICD-10-CM | POA: Insufficient documentation

## 2017-04-09 DIAGNOSIS — N390 Urinary tract infection, site not specified: Secondary | ICD-10-CM | POA: Insufficient documentation

## 2017-04-09 DIAGNOSIS — Z789 Other specified health status: Secondary | ICD-10-CM | POA: Insufficient documentation

## 2017-04-09 DIAGNOSIS — Z114 Encounter for screening for human immunodeficiency virus [HIV]: Secondary | ICD-10-CM | POA: Diagnosis not present

## 2017-04-09 LAB — CBC WITH DIFFERENTIAL/PLATELET
BASOS PCT: 1 %
Basophils Absolute: 0.1 10*3/uL (ref 0–0.1)
EOS ABS: 0.1 10*3/uL (ref 0–0.7)
Eosinophils Relative: 2 %
HCT: 43.8 % (ref 35.0–47.0)
HEMOGLOBIN: 14.5 g/dL (ref 12.0–16.0)
Lymphocytes Relative: 24 %
Lymphs Abs: 1.9 10*3/uL (ref 1.0–3.6)
MCH: 28.6 pg (ref 26.0–34.0)
MCHC: 33 g/dL (ref 32.0–36.0)
MCV: 86.8 fL (ref 80.0–100.0)
MONO ABS: 1.1 10*3/uL — AB (ref 0.2–0.9)
Monocytes Relative: 13 %
NEUTROS ABS: 5 10*3/uL (ref 1.4–6.5)
NEUTROS PCT: 60 %
Platelets: 475 10*3/uL — ABNORMAL HIGH (ref 150–440)
RBC: 5.05 MIL/uL (ref 3.80–5.20)
RDW: 14.2 % (ref 11.5–14.5)
WBC: 8.2 10*3/uL (ref 3.6–11.0)

## 2017-04-09 LAB — BASIC METABOLIC PANEL
Anion gap: 4 — ABNORMAL LOW (ref 5–15)
BUN: 18 mg/dL (ref 6–20)
CHLORIDE: 103 mmol/L (ref 101–111)
CO2: 26 mmol/L (ref 22–32)
Calcium: 8.8 mg/dL — ABNORMAL LOW (ref 8.9–10.3)
Creatinine, Ser: 0.78 mg/dL (ref 0.44–1.00)
GFR calc non Af Amer: >60 mL/min (ref >60)
Glucose, Bld: 93 mg/dL (ref 65–99)
POTASSIUM: 3.6 mmol/L (ref 3.5–5.1)
SODIUM: 133 mmol/L — AB (ref 135–145)

## 2017-04-09 LAB — MAGNESIUM: MAGNESIUM: 1.9 mg/dL (ref 1.7–2.4)

## 2017-04-09 LAB — PHOSPHORUS: PHOSPHORUS: 3.3 mg/dL (ref 2.5–4.6)

## 2017-06-01 ENCOUNTER — Other Ambulatory Visit
Admission: RE | Admit: 2017-06-01 | Discharge: 2017-06-01 | Disposition: A | Discharge status: Home / Self Care | Payer: Medicare Other | Source: Ambulatory Visit | Attending: Nephrology | Admitting: Nephrology

## 2017-06-01 DIAGNOSIS — N390 Urinary tract infection, site not specified: Secondary | ICD-10-CM | POA: Diagnosis not present

## 2017-06-01 DIAGNOSIS — E559 Vitamin D deficiency, unspecified: Secondary | ICD-10-CM | POA: Insufficient documentation

## 2017-06-01 DIAGNOSIS — B259 Cytomegaloviral disease, unspecified: Secondary | ICD-10-CM | POA: Diagnosis not present

## 2017-06-01 DIAGNOSIS — Z114 Encounter for screening for human immunodeficiency virus [HIV]: Secondary | ICD-10-CM | POA: Diagnosis not present

## 2017-06-01 DIAGNOSIS — Z789 Other specified health status: Secondary | ICD-10-CM | POA: Insufficient documentation

## 2017-06-01 DIAGNOSIS — Z9483 Pancreas transplant status: Secondary | ICD-10-CM | POA: Insufficient documentation

## 2017-06-01 DIAGNOSIS — Z09 Encounter for follow-up examination after completed treatment for conditions other than malignant neoplasm: Secondary | ICD-10-CM | POA: Insufficient documentation

## 2017-06-01 DIAGNOSIS — Z940 Kidney transplant status: Secondary | ICD-10-CM | POA: Diagnosis not present

## 2017-06-01 DIAGNOSIS — D631 Anemia in chronic kidney disease: Secondary | ICD-10-CM | POA: Insufficient documentation

## 2017-06-01 DIAGNOSIS — E1129 Type 2 diabetes mellitus with other diabetic kidney complication: Secondary | ICD-10-CM | POA: Diagnosis not present

## 2017-06-01 DIAGNOSIS — D899 Disorder involving the immune mechanism, unspecified: Secondary | ICD-10-CM | POA: Insufficient documentation

## 2017-06-01 DIAGNOSIS — Z79899 Other long term (current) drug therapy: Secondary | ICD-10-CM | POA: Insufficient documentation

## 2017-06-01 LAB — LIPID PANEL
CHOLESTEROL: 145 mg/dL (ref 0–200)
HDL: 70 mg/dL (ref >40)
LDL Cholesterol: 51 mg/dL (ref 0–99)
Total CHOL/HDL Ratio: 2.1 RATIO
Triglycerides: 122 mg/dL (ref <150)
VLDL: 24 mg/dL (ref 0–40)

## 2017-06-01 LAB — CBC WITH DIFFERENTIAL/PLATELET
Basophils Absolute: 0.1 10*3/uL (ref 0–0.1)
Basophils Relative: 1 %
EOS ABS: 0.2 10*3/uL (ref 0–0.7)
Eosinophils Relative: 2 %
HEMATOCRIT: 43.7 % (ref 35.0–47.0)
HEMOGLOBIN: 14.6 g/dL (ref 12.0–16.0)
LYMPHS ABS: 1.9 10*3/uL (ref 1.0–3.6)
Lymphocytes Relative: 22 %
MCH: 28.6 pg (ref 26.0–34.0)
MCHC: 33.3 g/dL (ref 32.0–36.0)
MCV: 85.8 fL (ref 80.0–100.0)
MONOS PCT: 12 %
Monocytes Absolute: 1 10*3/uL — ABNORMAL HIGH (ref 0.2–0.9)
NEUTROS ABS: 5.7 10*3/uL (ref 1.4–6.5)
NEUTROS PCT: 63 %
Platelets: 460 10*3/uL — ABNORMAL HIGH (ref 150–440)
RBC: 5.09 MIL/uL (ref 3.80–5.20)
RDW: 14.9 % — ABNORMAL HIGH (ref 11.5–14.5)
WBC: 9 10*3/uL (ref 3.6–11.0)

## 2017-06-01 LAB — BASIC METABOLIC PANEL
ANION GAP: 5 (ref 5–15)
BUN: 18 mg/dL (ref 6–20)
CHLORIDE: 102 mmol/L (ref 101–111)
CO2: 29 mmol/L (ref 22–32)
Calcium: 9.1 mg/dL (ref 8.9–10.3)
Creatinine, Ser: 0.82 mg/dL (ref 0.44–1.00)
GFR calc non Af Amer: >60 mL/min (ref >60)
Glucose, Bld: 94 mg/dL (ref 65–99)
POTASSIUM: 4.1 mmol/L (ref 3.5–5.1)
SODIUM: 136 mmol/L (ref 135–145)

## 2017-06-01 LAB — PHOSPHORUS: Phosphorus: 3.6 mg/dL (ref 2.5–4.6)

## 2017-06-01 LAB — ALBUMIN: ALBUMIN: 3.7 g/dL (ref 3.5–5.0)

## 2017-06-01 LAB — ALKALINE PHOSPHATASE: ALK PHOS: 61 U/L (ref 38–126)

## 2017-06-01 LAB — AST: AST: 25 U/L (ref 15–41)

## 2017-06-01 LAB — MAGNESIUM: MAGNESIUM: 1.8 mg/dL (ref 1.7–2.4)

## 2017-06-01 LAB — ALT: ALT: 25 U/L (ref 14–54)

## 2017-06-01 LAB — GAMMA GT: GGT: 28 U/L (ref 7–50)

## 2017-06-01 LAB — BILIRUBIN, TOTAL: Total Bilirubin: 0.4 mg/dL (ref 0.3–1.2)

## 2017-07-02 ENCOUNTER — Ambulatory Visit: Admission: RE | Admit: 2017-07-02 | Discharge: 2017-07-02 | Payer: MEDICARE

## 2017-07-02 MED ORDER — PREDNISONE 5 MG TABLET
ORAL_TABLET | Freq: Every day | ORAL | 3 refills | 0 days | Status: CP
Start: 2017-07-02 — End: 2018-05-08

## 2017-07-04 ENCOUNTER — Other Ambulatory Visit
Admission: RE | Admit: 2017-07-04 | Discharge: 2017-07-04 | Disposition: A | Discharge status: Home / Self Care | Payer: Medicare Other | Source: Ambulatory Visit | Attending: Nephrology | Admitting: Nephrology

## 2017-07-06 ENCOUNTER — Ambulatory Visit
Admission: RE | Admit: 2017-07-06 | Discharge: 2017-07-06 | Disposition: A | Discharge status: Home / Self Care | Payer: MEDICARE

## 2017-07-06 ENCOUNTER — Other Ambulatory Visit
Admission: RE | Admit: 2017-07-06 | Discharge: 2017-07-06 | Disposition: A | Discharge status: Home / Self Care | Payer: Medicare Other | Source: Ambulatory Visit | Attending: Nephrology | Admitting: Nephrology

## 2017-07-06 DIAGNOSIS — Z940 Kidney transplant status: Principal | ICD-10-CM

## 2017-07-06 DIAGNOSIS — D631 Anemia in chronic kidney disease: Secondary | ICD-10-CM | POA: Insufficient documentation

## 2017-07-06 DIAGNOSIS — Z79899 Other long term (current) drug therapy: Secondary | ICD-10-CM | POA: Insufficient documentation

## 2017-07-06 DIAGNOSIS — Z114 Encounter for screening for human immunodeficiency virus [HIV]: Secondary | ICD-10-CM | POA: Insufficient documentation

## 2017-07-06 DIAGNOSIS — Z9483 Pancreas transplant status: Secondary | ICD-10-CM | POA: Insufficient documentation

## 2017-07-06 DIAGNOSIS — Z789 Other specified health status: Secondary | ICD-10-CM | POA: Diagnosis not present

## 2017-07-06 DIAGNOSIS — N390 Urinary tract infection, site not specified: Secondary | ICD-10-CM | POA: Diagnosis not present

## 2017-07-06 DIAGNOSIS — Z09 Encounter for follow-up examination after completed treatment for conditions other than malignant neoplasm: Secondary | ICD-10-CM | POA: Insufficient documentation

## 2017-07-06 DIAGNOSIS — T8610 Unspecified complication of kidney transplant: Secondary | ICD-10-CM | POA: Diagnosis not present

## 2017-07-06 DIAGNOSIS — E1129 Type 2 diabetes mellitus with other diabetic kidney complication: Secondary | ICD-10-CM | POA: Insufficient documentation

## 2017-07-06 DIAGNOSIS — E559 Vitamin D deficiency, unspecified: Secondary | ICD-10-CM | POA: Diagnosis present

## 2017-07-06 DIAGNOSIS — N189 Chronic kidney disease, unspecified: Secondary | ICD-10-CM | POA: Insufficient documentation

## 2017-07-06 DIAGNOSIS — B259 Cytomegaloviral disease, unspecified: Secondary | ICD-10-CM | POA: Insufficient documentation

## 2017-07-06 DIAGNOSIS — D899 Disorder involving the immune mechanism, unspecified: Secondary | ICD-10-CM | POA: Diagnosis present

## 2017-07-06 LAB — CBC WITH DIFFERENTIAL/PLATELET
BASOS PCT: 1 %
Basophils Absolute: 0.1 10*3/uL (ref 0–0.1)
Eosinophils Absolute: 0.2 10*3/uL (ref 0–0.7)
Eosinophils Relative: 2 %
HEMATOCRIT: 43.3 % (ref 35.0–47.0)
Hemoglobin: 14.5 g/dL (ref 12.0–16.0)
Lymphocytes Relative: 22 %
Lymphs Abs: 2 10*3/uL (ref 1.0–3.6)
MCH: 28.7 pg (ref 26.0–34.0)
MCHC: 33.4 g/dL (ref 32.0–36.0)
MCV: 86.1 fL (ref 80.0–100.0)
MONOS PCT: 12 %
Monocytes Absolute: 1.1 10*3/uL — ABNORMAL HIGH (ref 0.2–0.9)
Neutro Abs: 5.7 10*3/uL (ref 1.4–6.5)
Neutrophils Relative %: 63 %
Platelets: 467 10*3/uL — ABNORMAL HIGH (ref 150–440)
RBC: 5.03 MIL/uL (ref 3.80–5.20)
RDW: 14.8 % — AB (ref 11.5–14.5)
WBC: 9 10*3/uL (ref 3.6–11.0)

## 2017-07-06 LAB — BASIC METABOLIC PANEL
Anion gap: 8 (ref 5–15)
BUN: 16 mg/dL (ref 6–20)
CALCIUM: 9.1 mg/dL (ref 8.9–10.3)
CO2: 25 mmol/L (ref 22–32)
CREATININE: 0.81 mg/dL (ref 0.44–1.00)
Chloride: 104 mmol/L (ref 101–111)
GFR calc non Af Amer: >60 mL/min (ref >60)
Glucose, Bld: 101 mg/dL — ABNORMAL HIGH (ref 65–99)
Potassium: 4 mmol/L (ref 3.5–5.1)
SODIUM: 137 mmol/L (ref 135–145)

## 2017-07-06 LAB — PHOSPHORUS: Phosphorus: 3.4 mg/dL (ref 2.5–4.6)

## 2017-07-06 LAB — MAGNESIUM: MAGNESIUM: 1.8 mg/dL (ref 1.7–2.4)

## 2017-07-30 ENCOUNTER — Ambulatory Visit
Admission: RE | Admit: 2017-07-30 | Discharge: 2017-07-30 | Disposition: A | Discharge status: Home / Self Care | Payer: MEDICARE | Attending: Nephrology

## 2017-07-30 DIAGNOSIS — E1169 Type 2 diabetes mellitus with other specified complication: Principal | ICD-10-CM

## 2017-07-30 DIAGNOSIS — E669 Obesity, unspecified: Secondary | ICD-10-CM

## 2017-07-30 DIAGNOSIS — I10 Essential (primary) hypertension: Secondary | ICD-10-CM

## 2017-07-30 MED ORDER — LISINOPRIL 10 MG TABLET
ORAL_TABLET | Freq: Every day | ORAL | 3 refills | 0.00000 days | Status: CP
Start: 2017-07-30 — End: 2018-08-07

## 2017-08-06 ENCOUNTER — Ambulatory Visit
Admission: RE | Admit: 2017-08-06 | Discharge: 2017-08-06 | Disposition: A | Discharge status: Home / Self Care | Payer: MEDICARE

## 2017-08-06 ENCOUNTER — Other Ambulatory Visit
Admission: RE | Admit: 2017-08-06 | Discharge: 2017-08-06 | Disposition: A | Discharge status: Home / Self Care | Payer: Medicare Other | Source: Ambulatory Visit | Attending: Nephrology | Admitting: Nephrology

## 2017-08-06 DIAGNOSIS — Z940 Kidney transplant status: Principal | ICD-10-CM

## 2017-08-06 DIAGNOSIS — N390 Urinary tract infection, site not specified: Secondary | ICD-10-CM | POA: Insufficient documentation

## 2017-08-06 DIAGNOSIS — E559 Vitamin D deficiency, unspecified: Secondary | ICD-10-CM | POA: Diagnosis not present

## 2017-08-06 DIAGNOSIS — Z9483 Pancreas transplant status: Secondary | ICD-10-CM | POA: Insufficient documentation

## 2017-08-06 DIAGNOSIS — Z79899 Other long term (current) drug therapy: Secondary | ICD-10-CM | POA: Diagnosis present

## 2017-08-06 DIAGNOSIS — E1129 Type 2 diabetes mellitus with other diabetic kidney complication: Secondary | ICD-10-CM | POA: Diagnosis not present

## 2017-08-06 DIAGNOSIS — Z09 Encounter for follow-up examination after completed treatment for conditions other than malignant neoplasm: Secondary | ICD-10-CM | POA: Diagnosis not present

## 2017-08-06 DIAGNOSIS — Z789 Other specified health status: Secondary | ICD-10-CM | POA: Diagnosis not present

## 2017-08-06 DIAGNOSIS — D899 Disorder involving the immune mechanism, unspecified: Secondary | ICD-10-CM | POA: Insufficient documentation

## 2017-08-06 DIAGNOSIS — Z114 Encounter for screening for human immunodeficiency virus [HIV]: Secondary | ICD-10-CM | POA: Diagnosis not present

## 2017-08-06 DIAGNOSIS — B259 Cytomegaloviral disease, unspecified: Secondary | ICD-10-CM | POA: Diagnosis not present

## 2017-08-06 DIAGNOSIS — D631 Anemia in chronic kidney disease: Secondary | ICD-10-CM | POA: Insufficient documentation

## 2017-08-06 LAB — BASIC METABOLIC PANEL
ANION GAP: 5 (ref 5–15)
BUN: 14 mg/dL (ref 6–20)
CALCIUM: 9.1 mg/dL (ref 8.9–10.3)
CO2: 29 mmol/L (ref 22–32)
Chloride: 105 mmol/L (ref 101–111)
Creatinine, Ser: 0.75 mg/dL (ref 0.44–1.00)
GFR calc Af Amer: >60 mL/min (ref >60)
GLUCOSE: 91 mg/dL (ref 65–99)
Potassium: 4 mmol/L (ref 3.5–5.1)
SODIUM: 139 mmol/L (ref 135–145)

## 2017-08-06 LAB — PHOSPHORUS: Phosphorus: 3.5 mg/dL (ref 2.5–4.6)

## 2017-08-06 LAB — CBC WITH DIFFERENTIAL/PLATELET
BASOS PCT: 1 %
Basophils Absolute: 0.1 10*3/uL (ref 0–0.1)
EOS ABS: 0.2 10*3/uL (ref 0–0.7)
EOS PCT: 2 %
HCT: 43.8 % (ref 35.0–47.0)
Hemoglobin: 14.6 g/dL (ref 12.0–16.0)
Lymphocytes Relative: 22 %
Lymphs Abs: 1.8 10*3/uL (ref 1.0–3.6)
MCH: 28.7 pg (ref 26.0–34.0)
MCHC: 33.3 g/dL (ref 32.0–36.0)
MCV: 86.1 fL (ref 80.0–100.0)
MONO ABS: 1 10*3/uL — AB (ref 0.2–0.9)
Monocytes Relative: 12 %
Neutro Abs: 5.3 10*3/uL (ref 1.4–6.5)
Neutrophils Relative %: 63 %
PLATELETS: 481 10*3/uL — AB (ref 150–440)
RBC: 5.09 MIL/uL (ref 3.80–5.20)
RDW: 14.6 % — AB (ref 11.5–14.5)
WBC: 8.3 10*3/uL (ref 3.6–11.0)

## 2017-08-06 LAB — MAGNESIUM: Magnesium: 2 mg/dL (ref 1.7–2.4)

## 2017-09-10 ENCOUNTER — Ambulatory Visit
Admission: RE | Admit: 2017-09-10 | Discharge: 2017-09-10 | Disposition: A | Discharge status: Home / Self Care | Payer: MEDICARE

## 2017-09-10 ENCOUNTER — Other Ambulatory Visit
Admission: RE | Admit: 2017-09-10 | Discharge: 2017-09-10 | Disposition: A | Discharge status: Home / Self Care | Payer: Medicare Other | Source: Ambulatory Visit | Attending: Nephrology | Admitting: Nephrology

## 2017-09-10 DIAGNOSIS — Z940 Kidney transplant status: Principal | ICD-10-CM

## 2017-09-10 DIAGNOSIS — E559 Vitamin D deficiency, unspecified: Secondary | ICD-10-CM | POA: Diagnosis not present

## 2017-09-10 DIAGNOSIS — Z114 Encounter for screening for human immunodeficiency virus [HIV]: Secondary | ICD-10-CM | POA: Insufficient documentation

## 2017-09-10 DIAGNOSIS — E1129 Type 2 diabetes mellitus with other diabetic kidney complication: Secondary | ICD-10-CM | POA: Insufficient documentation

## 2017-09-10 DIAGNOSIS — Z09 Encounter for follow-up examination after completed treatment for conditions other than malignant neoplasm: Secondary | ICD-10-CM | POA: Insufficient documentation

## 2017-09-10 DIAGNOSIS — Z9483 Pancreas transplant status: Secondary | ICD-10-CM | POA: Diagnosis not present

## 2017-09-10 DIAGNOSIS — B259 Cytomegaloviral disease, unspecified: Secondary | ICD-10-CM | POA: Diagnosis not present

## 2017-09-10 DIAGNOSIS — Z79899 Other long term (current) drug therapy: Secondary | ICD-10-CM | POA: Diagnosis not present

## 2017-09-10 DIAGNOSIS — D631 Anemia in chronic kidney disease: Secondary | ICD-10-CM | POA: Insufficient documentation

## 2017-09-10 DIAGNOSIS — N390 Urinary tract infection, site not specified: Secondary | ICD-10-CM | POA: Diagnosis not present

## 2017-09-10 DIAGNOSIS — N189 Chronic kidney disease, unspecified: Secondary | ICD-10-CM | POA: Diagnosis not present

## 2017-09-10 DIAGNOSIS — Z789 Other specified health status: Secondary | ICD-10-CM | POA: Insufficient documentation

## 2017-09-10 DIAGNOSIS — D899 Disorder involving the immune mechanism, unspecified: Secondary | ICD-10-CM | POA: Insufficient documentation

## 2017-09-10 LAB — BASIC METABOLIC PANEL
Anion gap: 7 (ref 5–15)
BUN: 15 mg/dL (ref 6–20)
CHLORIDE: 101 mmol/L (ref 101–111)
CO2: 30 mmol/L (ref 22–32)
Calcium: 9.3 mg/dL (ref 8.9–10.3)
Creatinine, Ser: 0.86 mg/dL (ref 0.44–1.00)
GFR calc Af Amer: >60 mL/min (ref >60)
GFR calc non Af Amer: >60 mL/min (ref >60)
GLUCOSE: 103 mg/dL — AB (ref 65–99)
Potassium: 4.5 mmol/L (ref 3.5–5.1)
Sodium: 138 mmol/L (ref 135–145)

## 2017-09-10 LAB — CBC WITH DIFFERENTIAL/PLATELET
Basophils Absolute: 0.1 10*3/uL (ref 0–0.1)
Basophils Relative: 1 %
EOS ABS: 0.1 10*3/uL (ref 0–0.7)
Eosinophils Relative: 1 %
HCT: 43.8 % (ref 35.0–47.0)
HEMOGLOBIN: 14.9 g/dL (ref 12.0–16.0)
LYMPHS ABS: 2.1 10*3/uL (ref 1.0–3.6)
Lymphocytes Relative: 23 %
MCH: 28.6 pg (ref 26.0–34.0)
MCHC: 34 g/dL (ref 32.0–36.0)
MCV: 84.3 fL (ref 80.0–100.0)
MONOS PCT: 11 %
Monocytes Absolute: 1.1 10*3/uL — ABNORMAL HIGH (ref 0.2–0.9)
Neutro Abs: 5.9 10*3/uL (ref 1.4–6.5)
Neutrophils Relative %: 64 %
Platelets: 480 10*3/uL — ABNORMAL HIGH (ref 150–440)
RBC: 5.2 MIL/uL (ref 3.80–5.20)
RDW: 14.5 % (ref 11.5–14.5)
WBC: 9.4 10*3/uL (ref 3.6–11.0)

## 2017-09-10 LAB — PHOSPHORUS: Phosphorus: 3.8 mg/dL (ref 2.5–4.6)

## 2017-09-10 LAB — MAGNESIUM: Magnesium: 1.8 mg/dL (ref 1.7–2.4)

## 2017-09-27 MED ORDER — PROGRAF 0.5 MG CAPSULE
ORAL_CAPSULE | 5 refills | 0 days | Status: CP
Start: 2017-09-27 — End: 2018-01-02

## 2017-09-30 MED ORDER — BD INSULIN SYRINGE ULTRA-FINE 0.5 ML 31 GAUGE X 5/16" (8 MM)
5 refills | 0 days | Status: CP
Start: 2017-09-30 — End: 2018-04-22

## 2017-10-09 ENCOUNTER — Other Ambulatory Visit
Admission: RE | Admit: 2017-10-09 | Discharge: 2017-10-09 | Disposition: A | Discharge status: Home / Self Care | Payer: Medicare Other | Source: Ambulatory Visit | Attending: Nephrology | Admitting: Nephrology

## 2017-10-09 DIAGNOSIS — D899 Disorder involving the immune mechanism, unspecified: Secondary | ICD-10-CM | POA: Diagnosis present

## 2017-10-09 DIAGNOSIS — D631 Anemia in chronic kidney disease: Secondary | ICD-10-CM | POA: Diagnosis not present

## 2017-10-09 DIAGNOSIS — Z09 Encounter for follow-up examination after completed treatment for conditions other than malignant neoplasm: Secondary | ICD-10-CM | POA: Insufficient documentation

## 2017-10-09 DIAGNOSIS — Z9483 Pancreas transplant status: Secondary | ICD-10-CM | POA: Diagnosis not present

## 2017-10-09 DIAGNOSIS — T8610 Unspecified complication of kidney transplant: Secondary | ICD-10-CM | POA: Diagnosis not present

## 2017-10-09 DIAGNOSIS — E1129 Type 2 diabetes mellitus with other diabetic kidney complication: Secondary | ICD-10-CM | POA: Diagnosis not present

## 2017-10-09 DIAGNOSIS — Z79899 Other long term (current) drug therapy: Secondary | ICD-10-CM | POA: Diagnosis not present

## 2017-10-09 DIAGNOSIS — B259 Cytomegaloviral disease, unspecified: Secondary | ICD-10-CM | POA: Diagnosis not present

## 2017-10-09 DIAGNOSIS — Z940 Kidney transplant status: Secondary | ICD-10-CM | POA: Insufficient documentation

## 2017-10-09 DIAGNOSIS — E559 Vitamin D deficiency, unspecified: Secondary | ICD-10-CM | POA: Insufficient documentation

## 2017-10-09 DIAGNOSIS — Z789 Other specified health status: Secondary | ICD-10-CM | POA: Diagnosis not present

## 2017-10-09 LAB — CBC WITH DIFFERENTIAL/PLATELET
Basophils Absolute: 0.1 10*3/uL (ref 0–0.1)
Basophils Relative: 2 %
Eosinophils Absolute: 0.1 10*3/uL (ref 0–0.7)
Eosinophils Relative: 1 %
HCT: 41.2 % (ref 35.0–47.0)
Hemoglobin: 14.1 g/dL (ref 12.0–16.0)
LYMPHS ABS: 2.2 10*3/uL (ref 1.0–3.6)
LYMPHS PCT: 23 %
MCH: 28.8 pg (ref 26.0–34.0)
MCHC: 34.2 g/dL (ref 32.0–36.0)
MCV: 84.2 fL (ref 80.0–100.0)
MONO ABS: 1.1 10*3/uL — AB (ref 0.2–0.9)
MONOS PCT: 12 %
Neutro Abs: 5.9 10*3/uL (ref 1.4–6.5)
Neutrophils Relative %: 62 %
PLATELETS: 488 10*3/uL — AB (ref 150–440)
RBC: 4.89 MIL/uL (ref 3.80–5.20)
RDW: 14.5 % (ref 11.5–14.5)
WBC: 9.5 10*3/uL (ref 3.6–11.0)

## 2017-10-09 LAB — BASIC METABOLIC PANEL
Anion gap: 6 (ref 5–15)
BUN: 12 mg/dL (ref 6–20)
CALCIUM: 9.2 mg/dL (ref 8.9–10.3)
CO2: 29 mmol/L (ref 22–32)
CREATININE: 0.83 mg/dL (ref 0.44–1.00)
Chloride: 102 mmol/L (ref 101–111)
GFR calc Af Amer: >60 mL/min (ref >60)
GFR calc non Af Amer: >60 mL/min (ref >60)
GLUCOSE: 101 mg/dL — AB (ref 65–99)
Potassium: 4.5 mmol/L (ref 3.5–5.1)
Sodium: 137 mmol/L (ref 135–145)

## 2017-10-09 LAB — MAGNESIUM: Magnesium: 1.8 mg/dL (ref 1.7–2.4)

## 2017-10-09 LAB — PHOSPHORUS: Phosphorus: 3.5 mg/dL (ref 2.5–4.6)

## 2017-10-12 ENCOUNTER — Ambulatory Visit
Admission: RE | Admit: 2017-10-12 | Discharge: 2017-10-12 | Disposition: A | Discharge status: Home / Self Care | Payer: MEDICARE

## 2017-10-12 DIAGNOSIS — Z940 Kidney transplant status: Principal | ICD-10-CM

## 2017-10-12 DIAGNOSIS — Z79899 Other long term (current) drug therapy: Secondary | ICD-10-CM

## 2017-11-06 ENCOUNTER — Ambulatory Visit: Admission: RE | Admit: 2017-11-06 | Discharge: 2017-11-06 | Disposition: A | Discharge status: Home / Self Care

## 2017-11-06 ENCOUNTER — Ambulatory Visit
Admission: RE | Admit: 2017-11-06 | Discharge: 2017-11-06 | Disposition: A | Discharge status: Home / Self Care | Payer: MEDICARE

## 2017-11-06 ENCOUNTER — Ambulatory Visit
Admission: RE | Admit: 2017-11-06 | Discharge: 2017-11-06 | Disposition: A | Discharge status: Home / Self Care | Admitting: Nephrology

## 2017-11-06 DIAGNOSIS — Z940 Kidney transplant status: Principal | ICD-10-CM

## 2017-11-06 DIAGNOSIS — Z79899 Other long term (current) drug therapy: Secondary | ICD-10-CM

## 2017-11-06 DIAGNOSIS — D899 Disorder involving the immune mechanism, unspecified: Secondary | ICD-10-CM

## 2017-11-06 DIAGNOSIS — E559 Vitamin D deficiency, unspecified: Secondary | ICD-10-CM

## 2017-11-06 DIAGNOSIS — Z48298 Encounter for aftercare following other organ transplant: Secondary | ICD-10-CM

## 2017-11-06 DIAGNOSIS — E119 Type 2 diabetes mellitus without complications: Secondary | ICD-10-CM

## 2017-11-06 DIAGNOSIS — Z0000 Encounter for general adult medical examination without abnormal findings: Secondary | ICD-10-CM

## 2017-11-06 DIAGNOSIS — Z114 Encounter for screening for human immunodeficiency virus [HIV]: Secondary | ICD-10-CM

## 2017-11-06 MED ORDER — EVEROLIMUS (IMMUNOSUPPRESSIVE) 0.5 MG TABLET
ORAL_TABLET | Freq: Two times a day (BID) | ORAL | 5 refills | 0.00000 days | Status: CP
Start: 2017-11-06 — End: 2018-04-27

## 2017-11-12 MED ORDER — LEVOTHYROXINE 100 MCG TABLET
ORAL_TABLET | Freq: Every day | ORAL | 10 refills | 0.00000 days | Status: CP
Start: 2017-11-12 — End: 2018-10-02

## 2017-11-28 ENCOUNTER — Ambulatory Visit: Admission: RE | Admit: 2017-11-28 | Discharge: 2017-11-28 | Payer: MEDICARE

## 2017-11-28 DIAGNOSIS — L720 Epidermal cyst: Secondary | ICD-10-CM

## 2017-11-28 DIAGNOSIS — L820 Inflamed seborrheic keratosis: Principal | ICD-10-CM

## 2017-11-28 DIAGNOSIS — Z85828 Personal history of other malignant neoplasm of skin: Secondary | ICD-10-CM

## 2017-12-06 ENCOUNTER — Ambulatory Visit
Admission: RE | Admit: 2017-12-06 | Discharge: 2017-12-06 | Disposition: A | Discharge status: Home / Self Care | Payer: MEDICARE | Attending: Nephrology | Admitting: Nephrology

## 2017-12-06 ENCOUNTER — Other Ambulatory Visit
Admission: RE | Admit: 2017-12-06 | Discharge: 2017-12-06 | Disposition: A | Discharge status: Home / Self Care | Payer: Medicare Other | Source: Ambulatory Visit | Attending: Nephrology | Admitting: Nephrology

## 2017-12-06 DIAGNOSIS — D899 Disorder involving the immune mechanism, unspecified: Secondary | ICD-10-CM | POA: Diagnosis not present

## 2017-12-06 DIAGNOSIS — Z9483 Pancreas transplant status: Secondary | ICD-10-CM | POA: Diagnosis not present

## 2017-12-06 DIAGNOSIS — Z114 Encounter for screening for human immunodeficiency virus [HIV]: Secondary | ICD-10-CM | POA: Diagnosis not present

## 2017-12-06 DIAGNOSIS — Z09 Encounter for follow-up examination after completed treatment for conditions other than malignant neoplasm: Secondary | ICD-10-CM | POA: Insufficient documentation

## 2017-12-06 DIAGNOSIS — D631 Anemia in chronic kidney disease: Secondary | ICD-10-CM | POA: Insufficient documentation

## 2017-12-06 DIAGNOSIS — Z789 Other specified health status: Secondary | ICD-10-CM | POA: Diagnosis not present

## 2017-12-06 DIAGNOSIS — E1129 Type 2 diabetes mellitus with other diabetic kidney complication: Secondary | ICD-10-CM | POA: Diagnosis not present

## 2017-12-06 DIAGNOSIS — Z79899 Other long term (current) drug therapy: Secondary | ICD-10-CM | POA: Insufficient documentation

## 2017-12-06 DIAGNOSIS — B259 Cytomegaloviral disease, unspecified: Secondary | ICD-10-CM | POA: Insufficient documentation

## 2017-12-06 DIAGNOSIS — Z940 Kidney transplant status: Secondary | ICD-10-CM | POA: Diagnosis present

## 2017-12-06 DIAGNOSIS — E559 Vitamin D deficiency, unspecified: Secondary | ICD-10-CM | POA: Diagnosis not present

## 2017-12-06 LAB — PHOSPHORUS: Phosphorus: 3.7 mg/dL (ref 2.5–4.6)

## 2017-12-06 LAB — CBC WITH DIFFERENTIAL/PLATELET
Basophils Absolute: 0.1 10*3/uL (ref 0–0.1)
Basophils Relative: 1 %
EOS ABS: 0.1 10*3/uL (ref 0–0.7)
EOS PCT: 2 %
HCT: 42.2 % (ref 35.0–47.0)
Hemoglobin: 14.1 g/dL (ref 12.0–16.0)
LYMPHS ABS: 1.9 10*3/uL (ref 1.0–3.6)
Lymphocytes Relative: 24 %
MCH: 28.4 pg (ref 26.0–34.0)
MCHC: 33.4 g/dL (ref 32.0–36.0)
MCV: 84.8 fL (ref 80.0–100.0)
MONOS PCT: 12 %
Monocytes Absolute: 1 10*3/uL — ABNORMAL HIGH (ref 0.2–0.9)
Neutro Abs: 5 10*3/uL (ref 1.4–6.5)
Neutrophils Relative %: 61 %
PLATELETS: 491 10*3/uL — AB (ref 150–440)
RBC: 4.98 MIL/uL (ref 3.80–5.20)
RDW: 14.9 % — ABNORMAL HIGH (ref 11.5–14.5)
WBC: 8.1 10*3/uL (ref 3.6–11.0)

## 2017-12-06 LAB — BASIC METABOLIC PANEL
ANION GAP: 7 (ref 5–15)
BUN: 14 mg/dL (ref 6–20)
CALCIUM: 9.5 mg/dL (ref 8.9–10.3)
CO2: 28 mmol/L (ref 22–32)
CREATININE: 0.81 mg/dL (ref 0.44–1.00)
Chloride: 103 mmol/L (ref 101–111)
GFR calc Af Amer: >60 mL/min (ref >60)
GLUCOSE: 127 mg/dL — AB (ref 65–99)
POTASSIUM: 5.1 mmol/L (ref 3.5–5.1)
SODIUM: 138 mmol/L (ref 135–145)

## 2017-12-06 LAB — MAGNESIUM: Magnesium: 1.8 mg/dL (ref 1.7–2.4)

## 2017-12-11 DIAGNOSIS — Z940 Kidney transplant status: Principal | ICD-10-CM

## 2017-12-11 DIAGNOSIS — Z79899 Other long term (current) drug therapy: Secondary | ICD-10-CM

## 2018-01-02 MED ORDER — PROGRAF 0.5 MG CAPSULE
ORAL_CAPSULE | 5 refills | 0 days | Status: CP
Start: 2018-01-02 — End: 2018-01-04

## 2018-01-04 MED ORDER — PROGRAF 0.5 MG CAPSULE
ORAL_CAPSULE | 5 refills | 0 days | Status: CP
Start: 2018-01-04 — End: 2018-01-07

## 2018-01-07 ENCOUNTER — Other Ambulatory Visit: Admit: 2018-01-07 | Discharge: 2018-01-08 | Payer: MEDICARE

## 2018-01-07 ENCOUNTER — Other Ambulatory Visit
Admission: RE | Admit: 2018-01-07 | Discharge: 2018-01-07 | Disposition: A | Discharge status: Home / Self Care | Payer: Medicare Other | Source: Ambulatory Visit | Attending: Nephrology | Admitting: Nephrology

## 2018-01-07 DIAGNOSIS — Z940 Kidney transplant status: Principal | ICD-10-CM

## 2018-01-07 DIAGNOSIS — Z789 Other specified health status: Secondary | ICD-10-CM | POA: Insufficient documentation

## 2018-01-07 DIAGNOSIS — B259 Cytomegaloviral disease, unspecified: Secondary | ICD-10-CM | POA: Diagnosis not present

## 2018-01-07 DIAGNOSIS — D899 Disorder involving the immune mechanism, unspecified: Secondary | ICD-10-CM | POA: Insufficient documentation

## 2018-01-07 DIAGNOSIS — E1122 Type 2 diabetes mellitus with diabetic chronic kidney disease: Secondary | ICD-10-CM | POA: Insufficient documentation

## 2018-01-07 DIAGNOSIS — D631 Anemia in chronic kidney disease: Secondary | ICD-10-CM | POA: Insufficient documentation

## 2018-01-07 DIAGNOSIS — N390 Urinary tract infection, site not specified: Secondary | ICD-10-CM | POA: Insufficient documentation

## 2018-01-07 DIAGNOSIS — Z9483 Pancreas transplant status: Secondary | ICD-10-CM | POA: Insufficient documentation

## 2018-01-07 DIAGNOSIS — Z79899 Other long term (current) drug therapy: Secondary | ICD-10-CM | POA: Diagnosis not present

## 2018-01-07 DIAGNOSIS — Z114 Encounter for screening for human immunodeficiency virus [HIV]: Secondary | ICD-10-CM | POA: Insufficient documentation

## 2018-01-07 DIAGNOSIS — E1129 Type 2 diabetes mellitus with other diabetic kidney complication: Secondary | ICD-10-CM | POA: Diagnosis not present

## 2018-01-07 DIAGNOSIS — E559 Vitamin D deficiency, unspecified: Secondary | ICD-10-CM | POA: Diagnosis not present

## 2018-01-07 DIAGNOSIS — X58XXXA Exposure to other specified factors, initial encounter: Secondary | ICD-10-CM | POA: Insufficient documentation

## 2018-01-07 DIAGNOSIS — T8610 Unspecified complication of kidney transplant: Secondary | ICD-10-CM | POA: Diagnosis not present

## 2018-01-07 DIAGNOSIS — Z09 Encounter for follow-up examination after completed treatment for conditions other than malignant neoplasm: Secondary | ICD-10-CM | POA: Insufficient documentation

## 2018-01-07 DIAGNOSIS — N189 Chronic kidney disease, unspecified: Secondary | ICD-10-CM | POA: Insufficient documentation

## 2018-01-07 LAB — BASIC METABOLIC PANEL
ANION GAP: 7 (ref 5–15)
BUN: 14 mg/dL (ref 6–20)
CALCIUM: 9 mg/dL (ref 8.9–10.3)
CO2: 27 mmol/L (ref 22–32)
CREATININE: 0.84 mg/dL (ref 0.44–1.00)
Chloride: 105 mmol/L (ref 101–111)
Glucose, Bld: 111 mg/dL — ABNORMAL HIGH (ref 65–99)
Potassium: 3.8 mmol/L (ref 3.5–5.1)
SODIUM: 139 mmol/L (ref 135–145)

## 2018-01-07 LAB — CBC WITH DIFFERENTIAL/PLATELET
BASOS ABS: 0.1 10*3/uL (ref 0–0.1)
BASOS PCT: 1 %
EOS ABS: 0.1 10*3/uL (ref 0–0.7)
Eosinophils Relative: 2 %
HEMATOCRIT: 41.8 % (ref 35.0–47.0)
Hemoglobin: 14.1 g/dL (ref 12.0–16.0)
Lymphocytes Relative: 24 %
Lymphs Abs: 1.9 10*3/uL (ref 1.0–3.6)
MCH: 28.9 pg (ref 26.0–34.0)
MCHC: 33.8 g/dL (ref 32.0–36.0)
MCV: 85.4 fL (ref 80.0–100.0)
Monocytes Absolute: 0.9 10*3/uL (ref 0.2–0.9)
Monocytes Relative: 11 %
NEUTROS ABS: 4.8 10*3/uL (ref 1.4–6.5)
NEUTROS PCT: 62 %
Platelets: 485 10*3/uL — ABNORMAL HIGH (ref 150–440)
RBC: 4.89 MIL/uL (ref 3.80–5.20)
RDW: 14.8 % — AB (ref 11.5–14.5)
WBC: 7.8 10*3/uL (ref 3.6–11.0)

## 2018-01-07 LAB — PHOSPHORUS: PHOSPHORUS: 3.5 mg/dL (ref 2.5–4.6)

## 2018-01-07 LAB — MAGNESIUM: MAGNESIUM: 1.8 mg/dL (ref 1.7–2.4)

## 2018-01-07 MED ORDER — TACROLIMUS 0.5 MG CAPSULE
ORAL_CAPSULE | 5 refills | 0 days | Status: CP
Start: 2018-01-07 — End: 2018-07-10

## 2018-01-23 ENCOUNTER — Other Ambulatory Visit: Payer: Self-pay | Admitting: Internal Medicine

## 2018-01-23 DIAGNOSIS — Z1231 Encounter for screening mammogram for malignant neoplasm of breast: Secondary | ICD-10-CM

## 2018-01-29 ENCOUNTER — Ambulatory Visit: Admit: 2018-01-29 | Discharge: 2018-01-30 | Payer: MEDICARE

## 2018-01-29 DIAGNOSIS — Z48298 Encounter for aftercare following other organ transplant: Principal | ICD-10-CM

## 2018-02-07 ENCOUNTER — Encounter: Admit: 2018-02-07 | Discharge: 2018-02-07 | Payer: MEDICARE | Attending: Nephrology | Primary: Nephrology

## 2018-02-07 ENCOUNTER — Other Ambulatory Visit
Admission: RE | Admit: 2018-02-07 | Discharge: 2018-02-07 | Disposition: A | Discharge status: Home / Self Care | Payer: Medicare Other | Source: Ambulatory Visit | Attending: Nephrology | Admitting: Nephrology

## 2018-02-07 ENCOUNTER — Ambulatory Visit
Admission: RE | Admit: 2018-02-07 | Discharge: 2018-02-07 | Disposition: A | Discharge status: Home / Self Care | Payer: Medicare Other | Source: Ambulatory Visit | Attending: Internal Medicine | Admitting: Internal Medicine

## 2018-02-07 DIAGNOSIS — Z940 Kidney transplant status: Secondary | ICD-10-CM | POA: Diagnosis not present

## 2018-02-07 DIAGNOSIS — D899 Disorder involving the immune mechanism, unspecified: Secondary | ICD-10-CM | POA: Insufficient documentation

## 2018-02-07 DIAGNOSIS — Z1231 Encounter for screening mammogram for malignant neoplasm of breast: Secondary | ICD-10-CM | POA: Diagnosis present

## 2018-02-07 DIAGNOSIS — Z09 Encounter for follow-up examination after completed treatment for conditions other than malignant neoplasm: Secondary | ICD-10-CM | POA: Diagnosis not present

## 2018-02-07 DIAGNOSIS — Z79899 Other long term (current) drug therapy: Secondary | ICD-10-CM | POA: Insufficient documentation

## 2018-02-07 DIAGNOSIS — D631 Anemia in chronic kidney disease: Secondary | ICD-10-CM | POA: Insufficient documentation

## 2018-02-07 DIAGNOSIS — N390 Urinary tract infection, site not specified: Secondary | ICD-10-CM | POA: Insufficient documentation

## 2018-02-07 DIAGNOSIS — E1129 Type 2 diabetes mellitus with other diabetic kidney complication: Secondary | ICD-10-CM | POA: Diagnosis not present

## 2018-02-07 DIAGNOSIS — B259 Cytomegaloviral disease, unspecified: Secondary | ICD-10-CM | POA: Diagnosis not present

## 2018-02-07 DIAGNOSIS — E559 Vitamin D deficiency, unspecified: Secondary | ICD-10-CM | POA: Diagnosis not present

## 2018-02-07 DIAGNOSIS — Z789 Other specified health status: Secondary | ICD-10-CM | POA: Diagnosis not present

## 2018-02-07 DIAGNOSIS — Z114 Encounter for screening for human immunodeficiency virus [HIV]: Secondary | ICD-10-CM | POA: Insufficient documentation

## 2018-02-07 DIAGNOSIS — Z9483 Pancreas transplant status: Secondary | ICD-10-CM | POA: Insufficient documentation

## 2018-02-07 DIAGNOSIS — Z0189 Encounter for other specified special examinations: Secondary | ICD-10-CM | POA: Insufficient documentation

## 2018-02-07 LAB — CBC WITH DIFFERENTIAL/PLATELET
BASOS ABS: 0.1 10*3/uL (ref 0–0.1)
Basophils Relative: 2 %
EOS ABS: 0.1 10*3/uL (ref 0–0.7)
EOS PCT: 1 %
HCT: 42.4 % (ref 35.0–47.0)
Hemoglobin: 14.5 g/dL (ref 12.0–16.0)
LYMPHS ABS: 2.1 10*3/uL (ref 1.0–3.6)
LYMPHS PCT: 23 %
MCH: 29.1 pg (ref 26.0–34.0)
MCHC: 34.2 g/dL (ref 32.0–36.0)
MCV: 85.3 fL (ref 80.0–100.0)
Monocytes Absolute: 1.1 10*3/uL — ABNORMAL HIGH (ref 0.2–0.9)
Monocytes Relative: 12 %
NEUTROS PCT: 62 %
Neutro Abs: 5.6 10*3/uL (ref 1.4–6.5)
PLATELETS: 448 10*3/uL — AB (ref 150–440)
RBC: 4.97 MIL/uL (ref 3.80–5.20)
RDW: 15.1 % — ABNORMAL HIGH (ref 11.5–14.5)
WBC: 9.1 10*3/uL (ref 3.6–11.0)

## 2018-02-07 LAB — BASIC METABOLIC PANEL
ANION GAP: 8 (ref 5–15)
BUN: 17 mg/dL (ref 6–20)
CHLORIDE: 101 mmol/L (ref 101–111)
CO2: 26 mmol/L (ref 22–32)
Calcium: 8.9 mg/dL (ref 8.9–10.3)
Creatinine, Ser: 0.85 mg/dL (ref 0.44–1.00)
Glucose, Bld: 102 mg/dL — ABNORMAL HIGH (ref 65–99)
POTASSIUM: 3.8 mmol/L (ref 3.5–5.1)
SODIUM: 135 mmol/L (ref 135–145)

## 2018-02-07 LAB — PHOSPHORUS: PHOSPHORUS: 3.4 mg/dL (ref 2.5–4.6)

## 2018-02-07 LAB — MAGNESIUM: MAGNESIUM: 1.9 mg/dL (ref 1.7–2.4)

## 2018-02-08 DIAGNOSIS — Z940 Kidney transplant status: Principal | ICD-10-CM

## 2018-02-08 DIAGNOSIS — Z79899 Other long term (current) drug therapy: Secondary | ICD-10-CM

## 2018-03-11 ENCOUNTER — Encounter: Admit: 2018-03-11 | Discharge: 2018-03-11 | Payer: MEDICARE | Attending: Nephrology | Primary: Nephrology

## 2018-03-11 ENCOUNTER — Ambulatory Visit: Admit: 2018-03-11 | Discharge: 2018-03-11 | Payer: MEDICARE

## 2018-03-11 ENCOUNTER — Other Ambulatory Visit
Admission: RE | Admit: 2018-03-11 | Discharge: 2018-03-11 | Disposition: A | Discharge status: Home / Self Care | Payer: Medicare Other | Source: Ambulatory Visit | Attending: Nephrology | Admitting: Nephrology

## 2018-03-11 DIAGNOSIS — D899 Disorder involving the immune mechanism, unspecified: Principal | ICD-10-CM

## 2018-03-11 DIAGNOSIS — Z940 Kidney transplant status: Secondary | ICD-10-CM

## 2018-03-11 DIAGNOSIS — Z789 Other specified health status: Secondary | ICD-10-CM | POA: Diagnosis not present

## 2018-03-11 DIAGNOSIS — B259 Cytomegaloviral disease, unspecified: Secondary | ICD-10-CM | POA: Diagnosis not present

## 2018-03-11 DIAGNOSIS — Z79899 Other long term (current) drug therapy: Secondary | ICD-10-CM | POA: Diagnosis present

## 2018-03-11 DIAGNOSIS — D631 Anemia in chronic kidney disease: Secondary | ICD-10-CM | POA: Insufficient documentation

## 2018-03-11 DIAGNOSIS — Z9483 Pancreas transplant status: Secondary | ICD-10-CM | POA: Diagnosis not present

## 2018-03-11 DIAGNOSIS — E559 Vitamin D deficiency, unspecified: Secondary | ICD-10-CM | POA: Diagnosis not present

## 2018-03-11 DIAGNOSIS — Z09 Encounter for follow-up examination after completed treatment for conditions other than malignant neoplasm: Secondary | ICD-10-CM | POA: Diagnosis present

## 2018-03-11 DIAGNOSIS — N390 Urinary tract infection, site not specified: Secondary | ICD-10-CM | POA: Diagnosis not present

## 2018-03-11 DIAGNOSIS — Z114 Encounter for screening for human immunodeficiency virus [HIV]: Secondary | ICD-10-CM | POA: Diagnosis not present

## 2018-03-11 DIAGNOSIS — E1129 Type 2 diabetes mellitus with other diabetic kidney complication: Secondary | ICD-10-CM | POA: Insufficient documentation

## 2018-03-11 LAB — CBC WITH DIFFERENTIAL/PLATELET
BASOS PCT: 1 %
Basophils Absolute: 0.1 10*3/uL (ref 0–0.1)
EOS ABS: 0.1 10*3/uL (ref 0–0.7)
EOS PCT: 1 %
HCT: 44.5 % (ref 35.0–47.0)
Hemoglobin: 15.1 g/dL (ref 12.0–16.0)
Lymphocytes Relative: 26 %
Lymphs Abs: 1.9 10*3/uL (ref 1.0–3.6)
MCH: 28.7 pg (ref 26.0–34.0)
MCHC: 34 g/dL (ref 32.0–36.0)
MCV: 84.3 fL (ref 80.0–100.0)
MONO ABS: 0.8 10*3/uL (ref 0.2–0.9)
MONOS PCT: 11 %
Neutro Abs: 4.5 10*3/uL (ref 1.4–6.5)
Neutrophils Relative %: 61 %
PLATELETS: 460 10*3/uL — AB (ref 150–440)
RBC: 5.28 MIL/uL — ABNORMAL HIGH (ref 3.80–5.20)
RDW: 14.8 % — ABNORMAL HIGH (ref 11.5–14.5)
WBC: 7.4 10*3/uL (ref 3.6–11.0)

## 2018-03-11 LAB — BASIC METABOLIC PANEL
Anion gap: 11 (ref 5–15)
BUN: 15 mg/dL (ref 6–20)
CO2: 28 mmol/L (ref 22–32)
CREATININE: 0.85 mg/dL (ref 0.44–1.00)
Calcium: 8.8 mg/dL — ABNORMAL LOW (ref 8.9–10.3)
Chloride: 99 mmol/L — ABNORMAL LOW (ref 101–111)
Glucose, Bld: 95 mg/dL (ref 65–99)
Potassium: 3.6 mmol/L (ref 3.5–5.1)
SODIUM: 138 mmol/L (ref 135–145)

## 2018-03-11 LAB — MAGNESIUM: MAGNESIUM: 1.8 mg/dL (ref 1.7–2.4)

## 2018-03-11 LAB — PHOSPHORUS: PHOSPHORUS: 3.7 mg/dL (ref 2.5–4.6)

## 2018-03-13 DIAGNOSIS — Z940 Kidney transplant status: Principal | ICD-10-CM

## 2018-04-08 ENCOUNTER — Ambulatory Visit: Admit: 2018-04-08 | Discharge: 2018-04-09 | Payer: MEDICARE

## 2018-04-08 ENCOUNTER — Other Ambulatory Visit
Admission: RE | Admit: 2018-04-08 | Discharge: 2018-04-08 | Disposition: A | Discharge status: Home / Self Care | Payer: Medicare Other | Source: Ambulatory Visit | Attending: Nephrology | Admitting: Nephrology

## 2018-04-08 DIAGNOSIS — Z940 Kidney transplant status: Principal | ICD-10-CM

## 2018-04-08 DIAGNOSIS — Z114 Encounter for screening for human immunodeficiency virus [HIV]: Secondary | ICD-10-CM | POA: Diagnosis not present

## 2018-04-08 DIAGNOSIS — Z79899 Other long term (current) drug therapy: Secondary | ICD-10-CM | POA: Diagnosis not present

## 2018-04-08 DIAGNOSIS — E1129 Type 2 diabetes mellitus with other diabetic kidney complication: Secondary | ICD-10-CM | POA: Diagnosis not present

## 2018-04-08 DIAGNOSIS — Z789 Other specified health status: Secondary | ICD-10-CM | POA: Diagnosis not present

## 2018-04-08 DIAGNOSIS — Z9483 Pancreas transplant status: Secondary | ICD-10-CM | POA: Diagnosis not present

## 2018-04-08 DIAGNOSIS — D631 Anemia in chronic kidney disease: Secondary | ICD-10-CM | POA: Insufficient documentation

## 2018-04-08 DIAGNOSIS — N390 Urinary tract infection, site not specified: Secondary | ICD-10-CM | POA: Diagnosis not present

## 2018-04-08 DIAGNOSIS — D899 Disorder involving the immune mechanism, unspecified: Secondary | ICD-10-CM | POA: Insufficient documentation

## 2018-04-08 DIAGNOSIS — Z09 Encounter for follow-up examination after completed treatment for conditions other than malignant neoplasm: Secondary | ICD-10-CM | POA: Insufficient documentation

## 2018-04-08 DIAGNOSIS — B259 Cytomegaloviral disease, unspecified: Secondary | ICD-10-CM | POA: Diagnosis not present

## 2018-04-08 DIAGNOSIS — T8610 Unspecified complication of kidney transplant: Secondary | ICD-10-CM | POA: Diagnosis not present

## 2018-04-08 LAB — BASIC METABOLIC PANEL
ANION GAP: 8 (ref 5–15)
BUN: 18 mg/dL (ref 6–20)
CALCIUM: 9.3 mg/dL (ref 8.9–10.3)
CHLORIDE: 102 mmol/L (ref 101–111)
CO2: 28 mmol/L (ref 22–32)
Creatinine, Ser: 0.8 mg/dL (ref 0.44–1.00)
GFR calc Af Amer: >60 mL/min (ref >60)
GFR calc non Af Amer: >60 mL/min (ref >60)
Glucose, Bld: 90 mg/dL (ref 65–99)
Potassium: 4.2 mmol/L (ref 3.5–5.1)
Sodium: 138 mmol/L (ref 135–145)

## 2018-04-08 LAB — CBC WITH DIFFERENTIAL/PLATELET
BASOS PCT: 0 %
Basophils Absolute: 0 10*3/uL (ref 0–0.1)
Eosinophils Absolute: 0.1 10*3/uL (ref 0–0.7)
Eosinophils Relative: 2 %
HEMATOCRIT: 41.2 % (ref 35.0–47.0)
HEMOGLOBIN: 13.8 g/dL (ref 12.0–16.0)
Lymphocytes Relative: 24 %
Lymphs Abs: 1.8 10*3/uL (ref 1.0–3.6)
MCH: 28.4 pg (ref 26.0–34.0)
MCHC: 33.5 g/dL (ref 32.0–36.0)
MCV: 84.6 fL (ref 80.0–100.0)
Monocytes Absolute: 0.9 10*3/uL (ref 0.2–0.9)
Monocytes Relative: 12 %
NEUTROS ABS: 4.7 10*3/uL (ref 1.4–6.5)
NEUTROS PCT: 62 %
Platelets: 438 10*3/uL (ref 150–440)
RBC: 4.87 MIL/uL (ref 3.80–5.20)
RDW: 14.7 % — AB (ref 11.5–14.5)
WBC: 7.6 10*3/uL (ref 3.6–11.0)

## 2018-04-08 LAB — PHOSPHORUS: Phosphorus: 3.6 mg/dL (ref 2.5–4.6)

## 2018-04-08 LAB — MAGNESIUM: Magnesium: 1.7 mg/dL (ref 1.7–2.4)

## 2018-04-22 ENCOUNTER — Ambulatory Visit: Admit: 2018-04-22 | Discharge: 2018-04-23 | Payer: MEDICARE | Attending: Dermatology | Primary: Dermatology

## 2018-04-22 DIAGNOSIS — L820 Inflamed seborrheic keratosis: Principal | ICD-10-CM

## 2018-04-27 MED ORDER — ZORTRESS 0.5 MG TABLET
ORAL_TABLET | 5 refills | 0 days | Status: CP
Start: 2018-04-27 — End: 2018-04-30

## 2018-04-27 MED ORDER — ATORVASTATIN 20 MG TABLET
ORAL_TABLET | 3 refills | 0 days | Status: CP
Start: 2018-04-27 — End: 2019-04-25

## 2018-04-30 MED ORDER — EVEROLIMUS (IMMUNOSUPPRESSIVE) 0.5 MG TABLET
ORAL_TABLET | Freq: Two times a day (BID) | ORAL | 5 refills | 0 days | Status: CP
Start: 2018-04-30 — End: 2018-10-30

## 2018-05-08 ENCOUNTER — Encounter: Admit: 2018-05-08 | Discharge: 2018-05-08 | Payer: MEDICARE | Attending: Nephrology | Primary: Nephrology

## 2018-05-08 ENCOUNTER — Other Ambulatory Visit
Admission: RE | Admit: 2018-05-08 | Discharge: 2018-05-08 | Disposition: A | Discharge status: Home / Self Care | Payer: Medicare Other | Source: Ambulatory Visit | Attending: Nephrology | Admitting: Nephrology

## 2018-05-08 DIAGNOSIS — D899 Disorder involving the immune mechanism, unspecified: Secondary | ICD-10-CM | POA: Diagnosis present

## 2018-05-08 DIAGNOSIS — E559 Vitamin D deficiency, unspecified: Secondary | ICD-10-CM | POA: Insufficient documentation

## 2018-05-08 DIAGNOSIS — E1129 Type 2 diabetes mellitus with other diabetic kidney complication: Secondary | ICD-10-CM | POA: Diagnosis not present

## 2018-05-08 DIAGNOSIS — Z9483 Pancreas transplant status: Secondary | ICD-10-CM | POA: Diagnosis not present

## 2018-05-08 DIAGNOSIS — Z940 Kidney transplant status: Secondary | ICD-10-CM | POA: Diagnosis present

## 2018-05-08 DIAGNOSIS — Z79899 Other long term (current) drug therapy: Secondary | ICD-10-CM | POA: Diagnosis present

## 2018-05-08 DIAGNOSIS — Z114 Encounter for screening for human immunodeficiency virus [HIV]: Secondary | ICD-10-CM | POA: Diagnosis not present

## 2018-05-08 DIAGNOSIS — Z09 Encounter for follow-up examination after completed treatment for conditions other than malignant neoplasm: Secondary | ICD-10-CM | POA: Insufficient documentation

## 2018-05-08 DIAGNOSIS — B259 Cytomegaloviral disease, unspecified: Secondary | ICD-10-CM | POA: Diagnosis not present

## 2018-05-08 DIAGNOSIS — N390 Urinary tract infection, site not specified: Secondary | ICD-10-CM | POA: Diagnosis not present

## 2018-05-08 DIAGNOSIS — Z789 Other specified health status: Secondary | ICD-10-CM | POA: Diagnosis not present

## 2018-05-08 DIAGNOSIS — D631 Anemia in chronic kidney disease: Secondary | ICD-10-CM | POA: Insufficient documentation

## 2018-05-08 LAB — BASIC METABOLIC PANEL
ANION GAP: 9 (ref 5–15)
BUN: 16 mg/dL (ref 6–20)
CALCIUM: 9 mg/dL (ref 8.9–10.3)
CO2: 26 mmol/L (ref 22–32)
Chloride: 103 mmol/L (ref 101–111)
Creatinine, Ser: 0.8 mg/dL (ref 0.44–1.00)
GLUCOSE: 96 mg/dL (ref 65–99)
Potassium: 4.2 mmol/L (ref 3.5–5.1)
SODIUM: 138 mmol/L (ref 135–145)

## 2018-05-08 LAB — CBC WITH DIFFERENTIAL/PLATELET
BASOS ABS: 0 10*3/uL (ref 0–0.1)
BASOS PCT: 0 %
Eosinophils Absolute: 0.2 10*3/uL (ref 0–0.7)
Eosinophils Relative: 2 %
HCT: 41.9 % (ref 35.0–47.0)
Hemoglobin: 14.1 g/dL (ref 12.0–16.0)
Lymphocytes Relative: 20 %
Lymphs Abs: 2 10*3/uL (ref 1.0–3.6)
MCH: 28.5 pg (ref 26.0–34.0)
MCHC: 33.5 g/dL (ref 32.0–36.0)
MCV: 84.9 fL (ref 80.0–100.0)
MONO ABS: 1.1 10*3/uL — AB (ref 0.2–0.9)
Monocytes Relative: 11 %
Neutro Abs: 6.4 10*3/uL (ref 1.4–6.5)
Neutrophils Relative %: 67 %
PLATELETS: 485 10*3/uL — AB (ref 150–440)
RBC: 4.94 MIL/uL (ref 3.80–5.20)
RDW: 14.8 % — AB (ref 11.5–14.5)
WBC: 9.6 10*3/uL (ref 3.6–11.0)

## 2018-05-08 LAB — MAGNESIUM: MAGNESIUM: 1.8 mg/dL (ref 1.7–2.4)

## 2018-05-08 LAB — PHOSPHORUS: PHOSPHORUS: 4.2 mg/dL (ref 2.5–4.6)

## 2018-05-08 MED ORDER — PREDNISONE 5 MG TABLET
ORAL_TABLET | 3 refills | 0 days | Status: CP
Start: 2018-05-08 — End: 2019-03-03

## 2018-05-10 DIAGNOSIS — Z940 Kidney transplant status: Principal | ICD-10-CM

## 2018-05-14 ENCOUNTER — Ambulatory Visit: Admit: 2018-05-14 | Discharge: 2018-05-15 | Payer: MEDICARE

## 2018-05-14 DIAGNOSIS — D899 Disorder involving the immune mechanism, unspecified: Principal | ICD-10-CM

## 2018-05-15 ENCOUNTER — Other Ambulatory Visit: Payer: Self-pay | Admitting: Internal Medicine

## 2018-05-15 ENCOUNTER — Ambulatory Visit
Admission: RE | Admit: 2018-05-15 | Discharge: 2018-05-15 | Disposition: A | Discharge status: Home / Self Care | Payer: Medicare Other | Source: Ambulatory Visit | Attending: Internal Medicine | Admitting: Internal Medicine

## 2018-05-15 DIAGNOSIS — M79604 Pain in right leg: Secondary | ICD-10-CM

## 2018-05-15 DIAGNOSIS — M79661 Pain in right lower leg: Secondary | ICD-10-CM | POA: Insufficient documentation

## 2018-06-07 ENCOUNTER — Encounter: Admit: 2018-06-07 | Discharge: 2018-06-07 | Payer: MEDICARE | Attending: Nephrology | Primary: Nephrology

## 2018-06-07 ENCOUNTER — Other Ambulatory Visit
Admission: RE | Admit: 2018-06-07 | Discharge: 2018-06-07 | Disposition: A | Discharge status: Home / Self Care | Payer: Medicare Other | Source: Ambulatory Visit | Attending: Nephrology | Admitting: Nephrology

## 2018-06-07 DIAGNOSIS — Z789 Other specified health status: Secondary | ICD-10-CM | POA: Diagnosis not present

## 2018-06-07 DIAGNOSIS — Z9483 Pancreas transplant status: Secondary | ICD-10-CM | POA: Diagnosis not present

## 2018-06-07 DIAGNOSIS — E1129 Type 2 diabetes mellitus with other diabetic kidney complication: Secondary | ICD-10-CM | POA: Diagnosis not present

## 2018-06-07 DIAGNOSIS — T8610 Unspecified complication of kidney transplant: Secondary | ICD-10-CM | POA: Diagnosis not present

## 2018-06-07 DIAGNOSIS — D631 Anemia in chronic kidney disease: Secondary | ICD-10-CM | POA: Diagnosis not present

## 2018-06-07 DIAGNOSIS — D899 Disorder involving the immune mechanism, unspecified: Secondary | ICD-10-CM | POA: Diagnosis present

## 2018-06-07 DIAGNOSIS — Z79899 Other long term (current) drug therapy: Secondary | ICD-10-CM | POA: Insufficient documentation

## 2018-06-07 DIAGNOSIS — E559 Vitamin D deficiency, unspecified: Secondary | ICD-10-CM | POA: Diagnosis not present

## 2018-06-07 DIAGNOSIS — B259 Cytomegaloviral disease, unspecified: Secondary | ICD-10-CM | POA: Insufficient documentation

## 2018-06-07 DIAGNOSIS — Z114 Encounter for screening for human immunodeficiency virus [HIV]: Secondary | ICD-10-CM | POA: Diagnosis not present

## 2018-06-07 DIAGNOSIS — X58XXXA Exposure to other specified factors, initial encounter: Secondary | ICD-10-CM | POA: Diagnosis not present

## 2018-06-07 DIAGNOSIS — N390 Urinary tract infection, site not specified: Secondary | ICD-10-CM | POA: Diagnosis not present

## 2018-06-07 DIAGNOSIS — Z940 Kidney transplant status: Secondary | ICD-10-CM | POA: Insufficient documentation

## 2018-06-07 LAB — PHOSPHORUS: PHOSPHORUS: 3.8 mg/dL (ref 2.5–4.6)

## 2018-06-07 LAB — CBC WITH DIFFERENTIAL/PLATELET
BASOS ABS: 0.1 10*3/uL (ref 0–0.1)
BASOS PCT: 1 %
EOS PCT: 1 %
Eosinophils Absolute: 0.1 10*3/uL (ref 0–0.7)
HCT: 42.4 % (ref 35.0–47.0)
Hemoglobin: 14.4 g/dL (ref 12.0–16.0)
Lymphocytes Relative: 19 %
Lymphs Abs: 1.7 10*3/uL (ref 1.0–3.6)
MCH: 28.5 pg (ref 26.0–34.0)
MCHC: 33.8 g/dL (ref 32.0–36.0)
MCV: 84.2 fL (ref 80.0–100.0)
Monocytes Absolute: 1 10*3/uL — ABNORMAL HIGH (ref 0.2–0.9)
Monocytes Relative: 11 %
Neutro Abs: 6.1 10*3/uL (ref 1.4–6.5)
Neutrophils Relative %: 68 %
PLATELETS: 496 10*3/uL — AB (ref 150–440)
RBC: 5.04 MIL/uL (ref 3.80–5.20)
RDW: 14.9 % — ABNORMAL HIGH (ref 11.5–14.5)
WBC: 9.1 10*3/uL (ref 3.6–11.0)

## 2018-06-07 LAB — BASIC METABOLIC PANEL
ANION GAP: 10 (ref 5–15)
BUN: 14 mg/dL (ref 6–20)
CALCIUM: 9.4 mg/dL (ref 8.9–10.3)
CO2: 27 mmol/L (ref 22–32)
Chloride: 102 mmol/L (ref 101–111)
Creatinine, Ser: 0.75 mg/dL (ref 0.44–1.00)
Glucose, Bld: 90 mg/dL (ref 65–99)
Potassium: 4.8 mmol/L (ref 3.5–5.1)
SODIUM: 139 mmol/L (ref 135–145)

## 2018-06-07 LAB — MAGNESIUM: MAGNESIUM: 1.8 mg/dL (ref 1.7–2.4)

## 2018-06-10 DIAGNOSIS — Z940 Kidney transplant status: Principal | ICD-10-CM

## 2018-06-26 ENCOUNTER — Ambulatory Visit: Admit: 2018-06-26 | Discharge: 2018-06-27 | Payer: MEDICARE

## 2018-06-26 DIAGNOSIS — L814 Other melanin hyperpigmentation: Secondary | ICD-10-CM

## 2018-06-26 DIAGNOSIS — L821 Other seborrheic keratosis: Secondary | ICD-10-CM

## 2018-06-26 DIAGNOSIS — D229 Melanocytic nevi, unspecified: Secondary | ICD-10-CM

## 2018-06-26 DIAGNOSIS — Z85828 Personal history of other malignant neoplasm of skin: Principal | ICD-10-CM

## 2018-07-10 MED ORDER — TACROLIMUS 0.5 MG CAPSULE
ORAL_CAPSULE | 5 refills | 0 days | Status: CP
Start: 2018-07-10 — End: 2019-02-12

## 2018-07-11 ENCOUNTER — Encounter: Admit: 2018-07-11 | Discharge: 2018-07-11 | Payer: MEDICARE | Attending: Nephrology | Primary: Nephrology

## 2018-07-11 ENCOUNTER — Other Ambulatory Visit
Admission: RE | Admit: 2018-07-11 | Discharge: 2018-07-11 | Disposition: A | Discharge status: Home / Self Care | Payer: Medicare Other | Source: Ambulatory Visit | Attending: Nephrology | Admitting: Nephrology

## 2018-07-11 DIAGNOSIS — D899 Disorder involving the immune mechanism, unspecified: Secondary | ICD-10-CM | POA: Insufficient documentation

## 2018-07-11 DIAGNOSIS — N390 Urinary tract infection, site not specified: Secondary | ICD-10-CM | POA: Diagnosis not present

## 2018-07-11 DIAGNOSIS — Z79899 Other long term (current) drug therapy: Secondary | ICD-10-CM | POA: Insufficient documentation

## 2018-07-11 DIAGNOSIS — T8610 Unspecified complication of kidney transplant: Secondary | ICD-10-CM | POA: Diagnosis not present

## 2018-07-11 DIAGNOSIS — E559 Vitamin D deficiency, unspecified: Secondary | ICD-10-CM | POA: Insufficient documentation

## 2018-07-11 DIAGNOSIS — B259 Cytomegaloviral disease, unspecified: Secondary | ICD-10-CM | POA: Diagnosis not present

## 2018-07-11 DIAGNOSIS — Z9483 Pancreas transplant status: Secondary | ICD-10-CM | POA: Diagnosis not present

## 2018-07-11 DIAGNOSIS — E1122 Type 2 diabetes mellitus with diabetic chronic kidney disease: Secondary | ICD-10-CM | POA: Insufficient documentation

## 2018-07-11 DIAGNOSIS — N189 Chronic kidney disease, unspecified: Secondary | ICD-10-CM | POA: Diagnosis not present

## 2018-07-11 DIAGNOSIS — D631 Anemia in chronic kidney disease: Secondary | ICD-10-CM | POA: Insufficient documentation

## 2018-07-11 DIAGNOSIS — B9689 Other specified bacterial agents as the cause of diseases classified elsewhere: Secondary | ICD-10-CM | POA: Insufficient documentation

## 2018-07-11 DIAGNOSIS — Y838 Other surgical procedures as the cause of abnormal reaction of the patient, or of later complication, without mention of misadventure at the time of the procedure: Secondary | ICD-10-CM | POA: Diagnosis not present

## 2018-07-11 DIAGNOSIS — Z789 Other specified health status: Secondary | ICD-10-CM | POA: Insufficient documentation

## 2018-07-11 DIAGNOSIS — Z114 Encounter for screening for human immunodeficiency virus [HIV]: Secondary | ICD-10-CM | POA: Insufficient documentation

## 2018-07-11 DIAGNOSIS — Z09 Encounter for follow-up examination after completed treatment for conditions other than malignant neoplasm: Secondary | ICD-10-CM | POA: Diagnosis not present

## 2018-07-11 LAB — PHOSPHORUS: Phosphorus: 3.9 mg/dL (ref 2.5–4.6)

## 2018-07-11 LAB — CBC WITH DIFFERENTIAL/PLATELET
BASOS ABS: 0.1 10*3/uL (ref 0–0.1)
Basophils Relative: 1 %
Eosinophils Absolute: 0.1 10*3/uL (ref 0–0.7)
Eosinophils Relative: 2 %
HEMATOCRIT: 42.4 % (ref 35.0–47.0)
HEMOGLOBIN: 14.1 g/dL (ref 12.0–16.0)
LYMPHS PCT: 22 %
Lymphs Abs: 1.8 10*3/uL (ref 1.0–3.6)
MCH: 28.6 pg (ref 26.0–34.0)
MCHC: 33.3 g/dL (ref 32.0–36.0)
MCV: 85.9 fL (ref 80.0–100.0)
MONO ABS: 0.9 10*3/uL (ref 0.2–0.9)
MONOS PCT: 11 %
NEUTROS ABS: 5.3 10*3/uL (ref 1.4–6.5)
NEUTROS PCT: 64 %
Platelets: 541 10*3/uL — ABNORMAL HIGH (ref 150–440)
RBC: 4.94 MIL/uL (ref 3.80–5.20)
RDW: 15 % — ABNORMAL HIGH (ref 11.5–14.5)
WBC: 8.3 10*3/uL (ref 3.6–11.0)

## 2018-07-11 LAB — BASIC METABOLIC PANEL
Anion gap: 12 (ref 5–15)
BUN: 19 mg/dL (ref 8–23)
CALCIUM: 9.3 mg/dL (ref 8.9–10.3)
CO2: 25 mmol/L (ref 22–32)
Chloride: 103 mmol/L (ref 98–111)
Creatinine, Ser: 0.68 mg/dL (ref 0.44–1.00)
GFR calc Af Amer: >60 mL/min (ref >60)
GFR calc non Af Amer: >60 mL/min (ref >60)
GLUCOSE: 80 mg/dL (ref 70–99)
Potassium: 4.6 mmol/L (ref 3.5–5.1)
Sodium: 140 mmol/L (ref 135–145)

## 2018-07-11 LAB — MAGNESIUM: Magnesium: 2.2 mg/dL (ref 1.7–2.4)

## 2018-07-15 DIAGNOSIS — Z79899 Other long term (current) drug therapy: Secondary | ICD-10-CM

## 2018-07-15 DIAGNOSIS — Z940 Kidney transplant status: Principal | ICD-10-CM

## 2018-07-16 DIAGNOSIS — R202 Paresthesia of skin: Secondary | ICD-10-CM | POA: Insufficient documentation

## 2018-07-16 DIAGNOSIS — R200 Anesthesia of skin: Secondary | ICD-10-CM | POA: Insufficient documentation

## 2018-08-05 ENCOUNTER — Encounter (INDEPENDENT_AMBULATORY_CARE_PROVIDER_SITE_OTHER): Payer: Medicare Other | Admitting: Vascular Surgery

## 2018-08-07 MED ORDER — LISINOPRIL 10 MG TABLET
ORAL_TABLET | Freq: Every day | ORAL | 3 refills | 0 days | Status: CP
Start: 2018-08-07 — End: 2019-05-01

## 2018-08-08 ENCOUNTER — Encounter: Admit: 2018-08-08 | Discharge: 2018-08-08 | Payer: MEDICARE | Attending: Nephrology | Primary: Nephrology

## 2018-08-08 ENCOUNTER — Other Ambulatory Visit
Admission: RE | Admit: 2018-08-08 | Discharge: 2018-08-08 | Disposition: A | Discharge status: Home / Self Care | Payer: Medicare Other | Source: Ambulatory Visit | Attending: Nephrology | Admitting: Nephrology

## 2018-08-08 DIAGNOSIS — N390 Urinary tract infection, site not specified: Secondary | ICD-10-CM | POA: Diagnosis not present

## 2018-08-08 DIAGNOSIS — D899 Disorder involving the immune mechanism, unspecified: Secondary | ICD-10-CM | POA: Insufficient documentation

## 2018-08-08 DIAGNOSIS — E559 Vitamin D deficiency, unspecified: Secondary | ICD-10-CM | POA: Diagnosis not present

## 2018-08-08 DIAGNOSIS — Z940 Kidney transplant status: Secondary | ICD-10-CM | POA: Diagnosis not present

## 2018-08-08 DIAGNOSIS — T8610 Unspecified complication of kidney transplant: Secondary | ICD-10-CM | POA: Diagnosis not present

## 2018-08-08 DIAGNOSIS — E1129 Type 2 diabetes mellitus with other diabetic kidney complication: Secondary | ICD-10-CM | POA: Diagnosis not present

## 2018-08-08 DIAGNOSIS — Z114 Encounter for screening for human immunodeficiency virus [HIV]: Secondary | ICD-10-CM | POA: Diagnosis not present

## 2018-08-08 DIAGNOSIS — Z79899 Other long term (current) drug therapy: Secondary | ICD-10-CM | POA: Diagnosis not present

## 2018-08-08 DIAGNOSIS — B259 Cytomegaloviral disease, unspecified: Secondary | ICD-10-CM | POA: Diagnosis not present

## 2018-08-08 DIAGNOSIS — Z789 Other specified health status: Secondary | ICD-10-CM | POA: Diagnosis not present

## 2018-08-08 DIAGNOSIS — D631 Anemia in chronic kidney disease: Secondary | ICD-10-CM | POA: Diagnosis not present

## 2018-08-08 DIAGNOSIS — Z9483 Pancreas transplant status: Secondary | ICD-10-CM | POA: Insufficient documentation

## 2018-08-08 LAB — CBC WITH DIFFERENTIAL/PLATELET
BASOS ABS: 0 10*3/uL (ref 0–0.1)
Basophils Relative: 1 %
Eosinophils Absolute: 0.1 10*3/uL (ref 0–0.7)
Eosinophils Relative: 1 %
HEMATOCRIT: 43.6 % (ref 35.0–47.0)
HEMOGLOBIN: 14.7 g/dL (ref 12.0–16.0)
LYMPHS ABS: 1.7 10*3/uL (ref 1.0–3.6)
LYMPHS PCT: 21 %
MCH: 28.8 pg (ref 26.0–34.0)
MCHC: 33.7 g/dL (ref 32.0–36.0)
MCV: 85.4 fL (ref 80.0–100.0)
Monocytes Absolute: 1 10*3/uL — ABNORMAL HIGH (ref 0.2–0.9)
Monocytes Relative: 12 %
NEUTROS ABS: 5.5 10*3/uL (ref 1.4–6.5)
NEUTROS PCT: 65 %
Platelets: 563 10*3/uL — ABNORMAL HIGH (ref 150–440)
RBC: 5.11 MIL/uL (ref 3.80–5.20)
RDW: 14.7 % — ABNORMAL HIGH (ref 11.5–14.5)
WBC: 8.3 10*3/uL (ref 3.6–11.0)

## 2018-08-08 LAB — BASIC METABOLIC PANEL
Anion gap: 13 (ref 5–15)
BUN: 13 mg/dL (ref 8–23)
CALCIUM: 9.5 mg/dL (ref 8.9–10.3)
CHLORIDE: 102 mmol/L (ref 98–111)
CO2: 24 mmol/L (ref 22–32)
Creatinine, Ser: 0.84 mg/dL (ref 0.44–1.00)
GFR calc Af Amer: >60 mL/min (ref >60)
GFR calc non Af Amer: >60 mL/min (ref >60)
GLUCOSE: 66 mg/dL — AB (ref 70–99)
POTASSIUM: 4.1 mmol/L (ref 3.5–5.1)
Sodium: 139 mmol/L (ref 135–145)

## 2018-08-08 LAB — MAGNESIUM: Magnesium: 2.2 mg/dL (ref 1.7–2.4)

## 2018-08-08 LAB — PHOSPHORUS: Phosphorus: 4 mg/dL (ref 2.5–4.6)

## 2018-08-09 DIAGNOSIS — Z79899 Other long term (current) drug therapy: Secondary | ICD-10-CM

## 2018-08-09 DIAGNOSIS — Z940 Kidney transplant status: Principal | ICD-10-CM

## 2018-08-12 ENCOUNTER — Ambulatory Visit (INDEPENDENT_AMBULATORY_CARE_PROVIDER_SITE_OTHER): Payer: Medicare Other | Admitting: Vascular Surgery

## 2018-08-12 DIAGNOSIS — M79604 Pain in right leg: Secondary | ICD-10-CM | POA: Diagnosis not present

## 2018-08-12 DIAGNOSIS — I70229 Atherosclerosis of native arteries of extremities with rest pain, unspecified extremity: Secondary | ICD-10-CM

## 2018-08-12 DIAGNOSIS — Z794 Long term (current) use of insulin: Secondary | ICD-10-CM

## 2018-08-12 DIAGNOSIS — E782 Mixed hyperlipidemia: Secondary | ICD-10-CM

## 2018-08-12 DIAGNOSIS — Z940 Kidney transplant status: Secondary | ICD-10-CM | POA: Diagnosis not present

## 2018-08-12 DIAGNOSIS — E1159 Type 2 diabetes mellitus with other circulatory complications: Secondary | ICD-10-CM | POA: Diagnosis not present

## 2018-08-17 ENCOUNTER — Encounter (INDEPENDENT_AMBULATORY_CARE_PROVIDER_SITE_OTHER): Payer: Self-pay | Admitting: Vascular Surgery

## 2018-08-17 DIAGNOSIS — Z940 Kidney transplant status: Secondary | ICD-10-CM | POA: Insufficient documentation

## 2018-08-17 DIAGNOSIS — E119 Type 2 diabetes mellitus without complications: Secondary | ICD-10-CM | POA: Insufficient documentation

## 2018-08-17 DIAGNOSIS — E785 Hyperlipidemia, unspecified: Secondary | ICD-10-CM

## 2018-08-17 DIAGNOSIS — M79606 Pain in leg, unspecified: Secondary | ICD-10-CM | POA: Insufficient documentation

## 2018-08-17 DIAGNOSIS — I70219 Atherosclerosis of native arteries of extremities with intermittent claudication, unspecified extremity: Secondary | ICD-10-CM | POA: Insufficient documentation

## 2018-08-17 HISTORY — DX: Hyperlipidemia, unspecified: E78.5

## 2018-08-17 NOTE — Progress Notes (Signed)
MRN : [id number]  Cindy Fischer is a 72 y.o. (20-Dec-1946) female who presents with chief complaint of  Chief Complaint  Patient presents with  . New Patient (Initial Visit)    ref potter for numbness and tingling  .  History of Present Illness:    The patient is seen for evaluation of painful lower extremities and diminished pulses. Patient notes the pain is always associated with activity and is very consistent day today. Typically, the pain occurs at less than one block, progress is as activity continues to the point that the patient must stop walking. Resting including standing still for several minutes allowed resumption of the activity and the ability to walk a similar distance before stopping again. Uneven terrain and inclined shorten the distance. The pain has been progressive over the past several years. The patient states the inability to walk is now having a profound negative impact on quality of life and daily activities.  The patient is describing right leg rest pain but she is not dangling of an extremity off the side of the bed during the night for relief. No open wounds or sores at this time. No prior interventions or surgeries.  No history of back problems or DJD of the lumbar sacral spine.   The patient denies changes in claudication symptoms or new rest pain symptoms.  No new ulcers or wounds of the foot.  The patient's blood pressure has been stable and relatively well controlled. The patient denies amaurosis fugax or recent TIA symptoms. There are no recent neurological changes noted. The patient denies history of DVT, PE or superficial thrombophlebitis. The patient denies recent episodes of angina or shortness of breath.   Current Meds  Medication Sig  . aspirin 81 MG chewable tablet Chew 81 mg by mouth daily.  Marland Kitchen atorvastatin (LIPITOR) 20 MG tablet Take 20 mg by mouth daily.  . cholecalciferol (VITAMIN D) 400 units TABS tablet Take 400 Units by mouth.  .  Everolimus 0.5 MG TABS Take by mouth.  . gabapentin (NEURONTIN) 100 MG capsule Take 100 mg in the morning and 200 mg at night  . Insulin NPH Human, Isophane, (NOVOLIN N [place]) Inject into the skin.  . Insulin Regular Human (NOVOLIN R IJ) Inject as directed.  Marland Kitchen levothyroxine (SYNTHROID, LEVOTHROID) 112 MCG tablet Take 112 mcg by mouth daily before breakfast.  . lisinopril (PRINIVIL,ZESTRIL) 5 MG tablet Take 5 mg by mouth daily.  Marland Kitchen omeprazole (PRILOSEC) 40 MG capsule Take 40 mg by mouth daily.  . predniSONE (DELTASONE) 5 MG tablet Take by mouth.  . tacrolimus (PROGRAF) 1 MG capsule Take 1 mg by mouth 2 (two) times daily.    Past Medical History:  Diagnosis Date  . Cancer ([place]) 2016   skin  . Diabetes mellitus without complication ([place])   . Hypothyroidism   . Renal disorder     Past Surgical History:  Procedure Laterality Date  . COLONOSCOPY  12/02/2010  . COLONOSCOPY WITH PROPOFOL N/A 09/04/2016   Procedure: COLONOSCOPY WITH PROPOFOL;  Surgeon: Christene Lye, MD;  Location: [facility] END[facility]COPY;  Service: Endoscopy;  Laterality: N/A;  . INSERTION OF DIALYSIS CATHETER    . 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 TONSILLECTOMY    . TUBAL LIGATION      Social History Social History   Tobacco Use  . Smoking status: Former Research scientist (life sciences)  . Smokeless tobacco: Never Used  Substance Use Topics  . Alcohol use: No  . Drug use: No  Family History Family History  Problem Relation Age of Onset  . Breast cancer Neg Hx   No family history of bleeding/clotting disorders, porphyria or autoimmune disease   Allergies  Allergen Reactions  . Oxycodone-Acetaminophen Nausea And Vomiting  . Penicillins Rash     REVIEW OF SYSTEMS (Negative unless checked)  Constitutional: [] Weight loss  [] Fever  [] Chills Cardiac: [] Chest pain   [] Chest pressure   [] Palpitations   [] Shortness of breath when laying flat   [] Shortness of breath with exertion. Vascular:  [x] Pain in legs with walking    [x] Pain in legs at rest  [] History of DVT   [] Phlebitis   [] Swelling in legs   [] Varicose veins   [] Non-healing ulcers Pulmonary:   [] Uses home oxygen   [] Productive cough   [] Hemoptysis   [] Wheeze  [] COPD   [] Asthma Neurologic:  [] Dizziness   [] Seizures   [] History of stroke   [] History of TIA  [] Aphasia   [] Vissual changes   [] Weakness or numbness in arm   [] Weakness or numbness in leg Musculoskeletal:   [] Joint swelling   [] Joint pain   [] Low back pain Hematologic:  [] Easy bruising  [] Easy bleeding   [] Hypercoagulable state   [] Anemic Gastrointestinal:  [] Diarrhea   [] Vomiting  [] Gastroesophageal reflux/heartburn   [] Difficulty swallowing. Genitourinary:  [x] Chronic kidney disease   [] Difficult urination  [] Frequent urination   [] Blood in urine Skin:  [] Rashes   [] Ulcers  Psychological:  [] History of anxiety   []  History of major depression.  Physical Examination  Vitals:   08/12/18 0946  BP: 126/69  Pulse: 79  Resp: 16  Weight: 158 lb 12.8 oz (72 kg)  Height: 5\' 2"  (1.575 m)   Body mass index is 29.04 kg/m. Gen: WD/WN, NAD Head: /AT, No temporalis wasting.  Ear/Nose/Throat: Hearing grossly intact, nares w/o erythema or drainage, poor dentition Eyes: PER, EOMI, sclera nonicteric.  Neck: Supple, no masses.  No bruit or JVD.  Pulmonary:  Good air movement, clear to auscultation bilaterally, no use of accessory muscles.  Cardiac: RRR, normal S1, S2, no Murmurs. Vascular: right leg cool to touch with 2 sec cap refill Vessel Right Left  Radial Palpable Palpable  DP/PT Not Palpable Not Palpable   Gastrointestinal: soft, non-distended. No guarding/no peritoneal signs.  Musculoskeletal: M/S 5/5 throughout.  No deformity or atrophy.  Neurologic: CN 2-12 intact. Pain and light touch intact in extremities.  Symmetrical.  Speech is fluent. Motor exam as listed above. Psychiatric: Judgment intact, Mood & affect appropriate for pt's clinical situation. Dermatologic: No rashes or ulcers  noted.  No changes consistent with cellulitis. Lymph : No Cervical lymphadenopathy, no lichenification or skin changes of chronic lymphedema.  CBC Lab Results  Component Value Date   WBC 8.3 08/08/2018   HGB 14.7 08/08/2018   HCT 43.6 08/08/2018   MCV 85.4 08/08/2018   PLT 563 (H) 08/08/2018    BMET    Component Value Date/Time   NA 139 08/08/2018 0815   NA 138 03/29/2015 0918   K 4.1 08/08/2018 0815   K 4.5 03/29/2015 0918   CL 102 08/08/2018 0815   CL 104 03/29/2015 0918   CO2 24 08/08/2018 0815   CO2 26 03/29/2015 0918   GLUCOSE 66 (L) 08/08/2018 0815   GLUCOSE 187 (H) 03/29/2015 0918   BUN 13 08/08/2018 0815   BUN 10 03/29/2015 0918   CREATININE 0.84 08/08/2018 0815   CREATININE 0.80 03/29/2015 0918   CALCIUM 9.5 08/08/2018 0815   CALCIUM 9.1 03/29/2015 1610  GFRNONAA >60 08/08/2018 0815   GFRNONAA >60 03/29/2015 0918   GFRAA >60 08/08/2018 0815   GFRAA >60 03/29/2015 0086   Estimated Creatinine Clearance: 56.3 mL/min (by C-G formula based on SCr of 0.84 mg/dL).  COAG No results found for: INR, PROTIME  Radiology No results found.   Assessment/Plan 1. Pain of right lower extremity Recommend:  Patient should undergo arterial duplex of the lower extremity ASAP because there has been a significant deterioration in the patient's lower extremity symptoms.  The patient states they are having increased pain and a marked decrease in the distance that they can walk.  The risks and benefits as well as the alternatives were discussed in detail with the patient.  All questions were answered.  Patient agrees to proceed and understands this could be a prelude to angiography and intervention.  The patient will follow up with me in the office to review the studies.    A total of 70 minutes was spent with this patient and greater than 50% was spent in counseling and coordination of care with the patient.  Discussion included the treatment options for vascular disease  including indications for surgery and intervention.  Also discussed is the appropriate timing of treatment.  In addition medical therapy was discussed.  2. Atherosclerosis of artery of extremity with rest pain ([place]) Recommend:  Patient should undergo arterial duplex of the lower extremity ASAP because there has been a significant deterioration in the patient's lower extremity symptoms.  The patient states they are having increased pain and a marked decrease in the distance that they can walk.  The risks and benefits as well as the alternatives were discussed in detail with the patient.  All questions were answered.  Patient agrees to proceed and understands this could be a prelude to angiography and intervention.  The patient will follow up with me in the office to review the studies.   3. Renal transplant, status post Will need to minimize contrast and the risk of angiography to her kiney was reviewed.  Currently her cr is normal preangio hydration  4. Type 2 diabetes mellitus with other circulatory complication, with long-term current use of insulin (HCC) Continue hypoglycemic medications as already ordered, these medications have been reviewed and there are no changes at this time.  Hgb A1C to be monitored as already arranged by primary service   5. Mixed hyperlipidemia Continue statin as ordered and reviewed, no changes at this time     Hortencia Pilar, MD  08/17/2018 1:43 PM

## 2018-09-06 ENCOUNTER — Encounter: Admit: 2018-09-06 | Discharge: 2018-09-06 | Payer: MEDICARE | Attending: Nephrology | Primary: Nephrology

## 2018-09-06 ENCOUNTER — Other Ambulatory Visit
Admission: RE | Admit: 2018-09-06 | Discharge: 2018-09-06 | Disposition: A | Discharge status: Home / Self Care | Payer: Medicare Other | Source: Ambulatory Visit | Attending: Nephrology | Admitting: Nephrology

## 2018-09-06 DIAGNOSIS — N390 Urinary tract infection, site not specified: Secondary | ICD-10-CM | POA: Diagnosis not present

## 2018-09-06 DIAGNOSIS — Z940 Kidney transplant status: Secondary | ICD-10-CM | POA: Diagnosis present

## 2018-09-06 DIAGNOSIS — D631 Anemia in chronic kidney disease: Secondary | ICD-10-CM | POA: Diagnosis not present

## 2018-09-06 DIAGNOSIS — B259 Cytomegaloviral disease, unspecified: Secondary | ICD-10-CM | POA: Insufficient documentation

## 2018-09-06 DIAGNOSIS — E559 Vitamin D deficiency, unspecified: Secondary | ICD-10-CM | POA: Diagnosis not present

## 2018-09-06 DIAGNOSIS — Z79899 Other long term (current) drug therapy: Secondary | ICD-10-CM | POA: Insufficient documentation

## 2018-09-06 DIAGNOSIS — Z09 Encounter for follow-up examination after completed treatment for conditions other than malignant neoplasm: Secondary | ICD-10-CM | POA: Insufficient documentation

## 2018-09-06 DIAGNOSIS — E1129 Type 2 diabetes mellitus with other diabetic kidney complication: Secondary | ICD-10-CM | POA: Diagnosis not present

## 2018-09-06 DIAGNOSIS — Z9483 Pancreas transplant status: Secondary | ICD-10-CM | POA: Diagnosis not present

## 2018-09-06 DIAGNOSIS — Z114 Encounter for screening for human immunodeficiency virus [HIV]: Secondary | ICD-10-CM | POA: Diagnosis not present

## 2018-09-06 DIAGNOSIS — Z789 Other specified health status: Secondary | ICD-10-CM | POA: Diagnosis not present

## 2018-09-06 DIAGNOSIS — D899 Disorder involving the immune mechanism, unspecified: Secondary | ICD-10-CM | POA: Diagnosis not present

## 2018-09-06 LAB — CBC WITH DIFFERENTIAL/PLATELET
BASOS PCT: 1 %
Basophils Absolute: 0.1 10*3/uL (ref 0–0.1)
EOS ABS: 0.1 10*3/uL (ref 0–0.7)
EOS PCT: 1 %
HCT: 40.8 % (ref 35.0–47.0)
Hemoglobin: 13.9 g/dL (ref 12.0–16.0)
LYMPHS ABS: 1.9 10*3/uL (ref 1.0–3.6)
Lymphocytes Relative: 23 %
MCH: 29.1 pg (ref 26.0–34.0)
MCHC: 34.1 g/dL (ref 32.0–36.0)
MCV: 85.3 fL (ref 80.0–100.0)
MONOS PCT: 13 %
Monocytes Absolute: 1.1 10*3/uL — ABNORMAL HIGH (ref 0.2–0.9)
Neutro Abs: 5.1 10*3/uL (ref 1.4–6.5)
Neutrophils Relative %: 62 %
PLATELETS: 525 10*3/uL — AB (ref 150–440)
RBC: 4.78 MIL/uL (ref 3.80–5.20)
RDW: 14.9 % — ABNORMAL HIGH (ref 11.5–14.5)
WBC: 8.2 10*3/uL (ref 3.6–11.0)

## 2018-09-06 LAB — MAGNESIUM: Magnesium: 2 mg/dL (ref 1.7–2.4)

## 2018-09-06 LAB — BASIC METABOLIC PANEL
Anion gap: 9 (ref 5–15)
BUN: 12 mg/dL (ref 8–23)
CALCIUM: 9.1 mg/dL (ref 8.9–10.3)
CO2: 26 mmol/L (ref 22–32)
Chloride: 102 mmol/L (ref 98–111)
Creatinine, Ser: 0.73 mg/dL (ref 0.44–1.00)
GFR calc Af Amer: >60 mL/min (ref >60)
GLUCOSE: 67 mg/dL — AB (ref 70–99)
Potassium: 4.3 mmol/L (ref 3.5–5.1)
SODIUM: 137 mmol/L (ref 135–145)

## 2018-09-06 LAB — PHOSPHORUS: PHOSPHORUS: 3.2 mg/dL (ref 2.5–4.6)

## 2018-09-09 DIAGNOSIS — Z940 Kidney transplant status: Principal | ICD-10-CM

## 2018-09-09 DIAGNOSIS — Z79899 Other long term (current) drug therapy: Secondary | ICD-10-CM

## 2018-09-19 ENCOUNTER — Ambulatory Visit (INDEPENDENT_AMBULATORY_CARE_PROVIDER_SITE_OTHER): Payer: Medicare Other

## 2018-09-19 ENCOUNTER — Encounter (INDEPENDENT_AMBULATORY_CARE_PROVIDER_SITE_OTHER): Payer: Self-pay | Admitting: Vascular Surgery

## 2018-09-19 ENCOUNTER — Ambulatory Visit (INDEPENDENT_AMBULATORY_CARE_PROVIDER_SITE_OTHER): Payer: Medicare Other | Admitting: Vascular Surgery

## 2018-09-19 VITALS — BP 150/74 | HR 68 | Resp 16 | Ht 62.0 in | Wt 155.0 lb

## 2018-09-19 DIAGNOSIS — I70229 Atherosclerosis of native arteries of extremities with rest pain, unspecified extremity: Secondary | ICD-10-CM

## 2018-09-19 DIAGNOSIS — M79604 Pain in right leg: Secondary | ICD-10-CM | POA: Diagnosis not present

## 2018-09-19 DIAGNOSIS — E782 Mixed hyperlipidemia: Secondary | ICD-10-CM

## 2018-09-19 DIAGNOSIS — E1159 Type 2 diabetes mellitus with other circulatory complications: Secondary | ICD-10-CM | POA: Diagnosis not present

## 2018-09-19 DIAGNOSIS — I70221 Atherosclerosis of native arteries of extremities with rest pain, right leg: Secondary | ICD-10-CM

## 2018-09-19 DIAGNOSIS — Z940 Kidney transplant status: Secondary | ICD-10-CM | POA: Diagnosis not present

## 2018-09-19 DIAGNOSIS — Z794 Long term (current) use of insulin: Secondary | ICD-10-CM

## 2018-09-19 DIAGNOSIS — Z87891 Personal history of nicotine dependence: Secondary | ICD-10-CM

## 2018-09-25 ENCOUNTER — Other Ambulatory Visit (INDEPENDENT_AMBULATORY_CARE_PROVIDER_SITE_OTHER): Payer: Self-pay | Admitting: Nurse Practitioner

## 2018-09-25 ENCOUNTER — Encounter (INDEPENDENT_AMBULATORY_CARE_PROVIDER_SITE_OTHER): Payer: Medicare Other

## 2018-09-25 ENCOUNTER — Ambulatory Visit (INDEPENDENT_AMBULATORY_CARE_PROVIDER_SITE_OTHER): Payer: Medicare Other | Admitting: Nurse Practitioner

## 2018-09-25 DIAGNOSIS — I70229 Atherosclerosis of native arteries of extremities with rest pain, unspecified extremity: Secondary | ICD-10-CM

## 2018-09-26 ENCOUNTER — Ambulatory Visit (INDEPENDENT_AMBULATORY_CARE_PROVIDER_SITE_OTHER): Payer: Medicare Other

## 2018-09-26 ENCOUNTER — Ambulatory Visit (INDEPENDENT_AMBULATORY_CARE_PROVIDER_SITE_OTHER): Payer: Medicare Other | Admitting: Nurse Practitioner

## 2018-09-26 ENCOUNTER — Encounter (INDEPENDENT_AMBULATORY_CARE_PROVIDER_SITE_OTHER): Payer: Self-pay | Admitting: Nurse Practitioner

## 2018-09-26 ENCOUNTER — Encounter (INDEPENDENT_AMBULATORY_CARE_PROVIDER_SITE_OTHER): Payer: Self-pay

## 2018-09-26 VITALS — BP 192/77 | HR 74 | Resp 17 | Ht 63.0 in | Wt 153.0 lb

## 2018-09-26 DIAGNOSIS — I70229 Atherosclerosis of native arteries of extremities with rest pain, unspecified extremity: Secondary | ICD-10-CM

## 2018-09-26 DIAGNOSIS — Z794 Long term (current) use of insulin: Secondary | ICD-10-CM

## 2018-09-26 DIAGNOSIS — Z940 Kidney transplant status: Secondary | ICD-10-CM | POA: Diagnosis not present

## 2018-09-26 DIAGNOSIS — E782 Mixed hyperlipidemia: Secondary | ICD-10-CM | POA: Diagnosis not present

## 2018-09-26 DIAGNOSIS — E1159 Type 2 diabetes mellitus with other circulatory complications: Secondary | ICD-10-CM

## 2018-09-26 DIAGNOSIS — Z87891 Personal history of nicotine dependence: Secondary | ICD-10-CM

## 2018-09-26 NOTE — Progress Notes (Signed)
Subjective:    Patient ID: Cindy Fischer, female    DOB: [birth year], 72 y.o.   MRN: [id number] Chief Complaint  Patient presents with  . Follow-up    Aortic iliac follow up    HPI  Cindy Fischer is a 72 y.o. female The patient returns to the office for followup and review of the noninvasive studies. There has been a significant deterioration in the lower extremity symptoms.  The patient notes interval shortening of their claudication distance and development of mild rest pain symptoms. No new ulcers or wounds have occurred since the last visit.  There have been no significant changes to the patient's overall health care.  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  New kidney is located on her right.  The patient denies amaurosis fugax or recent TIA symptoms. There are no recent neurological changes noted. The patient denies history of DVT, PE or superficial thrombophlebitis. The patient denies recent episodes of angina or shortness of breath.   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  There is no evidence of abdominal aortic aneurysm.  There are calcifications within the bilateral common iliac arteries.  Review of Systems   Review of Systems: Negative Unless Checked Constitutional: [] Weight loss  [] Fever  [] Chills Cardiac: [] Chest pain   ? Atrial Fibrillation  [] Palpitations   [] Shortness of breath when laying flat   [] Shortness of breath with exertion. Vascular:  [x] Pain in legs with walking   [] Pain in legs with standing  [] History of DVT   [] Phlebitis   [] Swelling in legs   [] Varicose veins   [] Non-healing ulcers Pulmonary:   [] Uses home oxygen   [] Productive cough   [] Hemoptysis   [] Wheeze  [] COPD   [] Asthma Neurologic:  [] Dizziness   [] Seizures   [] History of stroke   [] History of TIA  [] Aphasia   [] Vissual changes   [] Weakness or numbness in arm   [x] Weakness or numbness in  leg Musculoskeletal:   [] Joint swelling   [] Joint pain   [] Low back pain  ? History of Knee Replacement Hematologic:  [] Easy bruising  [] Easy bleeding   [] Hypercoagulable state   [] Anemic Gastrointestinal:  [] Diarrhea   [] Vomiting  [] Gastroesophageal reflux/heartburn   [] Difficulty swallowing. Genitourinary:  [x] Chronic kidney disease   [] Difficult urination  [] Anuric   [] Blood in urine Skin:  [] Rashes   [] Ulcers  Psychological:  [] History of anxiety   []  History of major depression  ? Memory Difficulties     Objective:   Physical Exam  BP (!) 192/77 (BP Location: Left Arm, Patient Position: Sitting)   Pulse 74   Resp 17   Ht 5\' 3"  (1.6 m)   Wt 153 lb (69.4 kg)   BMI 27.10 kg/m   Past Medical History:  Diagnosis Date  . Cancer ([place]) 2016   skin  . Diabetes mellitus without complication ([place])   . Hypothyroidism   . Renal disorder      Gen: WD/WN, NAD Head: [place]/AT, No temporalis wasting.  Ear/Nose/Throat: Hearing grossly intact, nares w/o erythema or drainage Eyes: PER, EOMI, sclera nonicteric.  Neck: Supple, no masses.  No JVD.  Pulmonary:  Good air movement, no use of accessory muscles.  Cardiac: RRR Vascular:  Vessel Right Left  Radial Palpable Palpable  Dorsalis Pedis Not-Palpable Not-Palpable  Posterior Tibial Not-Palpable Not-Palpable   Gastrointestinal: soft, non-distended. No guarding/no peritoneal signs.  Musculoskeletal: M/S 5/5 throughout.  No  deformity or atrophy.  Neurologic: Pain and light touch intact in extremities.  Symmetrical.  Speech is fluent. Motor exam as listed above. Psychiatric: Judgment intact, Mood & affect appropriate for pt's clinical situation. Dermatologic: No Venous rashes. No Ulcers Noted.  No changes consistent with cellulitis. Lymph : No Cervical lymphadenopathy, no lichenification or skin changes of chronic lymphedema.   Social History   Socioeconomic History  . Marital status: Divorced    Spouse name: Not on file  . Number of  children: Not on file  . Years of education: Not on file  . Highest education level: Not on file  Occupational History  . Not on file  Social Needs  . Financial resource strain: Not on file  . Food insecurity:    Worry: Not on file    Inability: Not on file  . Transportation needs:    Medical: Not on file    Non-medical: Not on file  Tobacco Use  . Smoking status: Former Research scientist (life sciences)  . Smokeless tobacco: Never Used  Substance and Sexual Activity  . Alcohol use: No  . Drug use: No  . Sexual activity: Not on file  Lifestyle  . Physical activity:    Days per week: Not on file    Minutes per session: Not on file  . Stress: Not on file  Relationships  . Social connections:    Talks on phone: Not on file    Gets together: Not on file    Attends religious service: Not on file    Active member of club or organization: Not on file    Attends meetings of clubs or organizations: Not on file    Relationship status: Not on file  . Intimate partner violence:    Fear of current or ex partner: Not on file    Emotionally abused: Not on file    Physically abused: Not on file    Forced sexual activity: Not on file  Other Topics Concern  . Not on file  Social History Narrative  . Not on file    Past Surgical History:  Procedure Laterality Date  . COLONOSCOPY  12/02/2010  . COLONOSCOPY WITH PROPOFOL N/A 09/04/2016   Procedure: COLONOSCOPY WITH PROPOFOL;  Surgeon: Christene Lye, MD;  Location: [facility] ENDOSCOPY;  Service: Endoscopy;  Laterality: N/A;  . INSERTION OF DIALYSIS CATHETER    . 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 TONSILLECTOMY    . TUBAL LIGATION      Family History  Problem Relation Age of Onset  . Breast cancer Neg Hx     Allergies  Allergen Reactions  . Oxycodone-Acetaminophen Nausea And Vomiting  . Penicillins Rash       Assessment & Plan:   1. Atherosclerosis of artery of extremity with rest pain ([place]) Recommend:  The patient has evidence of  severe atherosclerotic changes of the right lower extremity with rest pain that is associated with preulcerative changes and impending tissue loss of the foot.  This represents a limb threatening ischemia and places the patient at the risk for limb loss.  Patient should undergo angiography of the right lower extremity with the hope for intervention for limb salvage.  The risks and benefits as well as the alternative therapies was discussed in detail with the patient.  All questions were answered.  Patient agrees to proceed with angiography.  The patient will follow up with me in the office after the procedure.       2. Type 2 diabetes mellitus  with other circulatory complication, with long-term current use of insulin (HCC) Continue hypoglycemic medications as already ordered, these medications have been reviewed and there are no changes at this time.  Hgb A1C to be monitored as already arranged by primary service   3. Mixed hyperlipidemia Continue statin as ordered and reviewed, no changes at this time   4. Renal transplant, status post Patient has had consistently good creatinine and bun readings.  Kidney status is good.  Patient routinely hydrates with greater than 75 ounces of fluids daily.  Blood pressure is elevated today, however due to the test she did not take many of her morning medicines.  Patient advised to continue hydrating greater than 75 ounces of fluid prior to angiogram.   Current Outpatient Medications on File Prior to Visit  Medication Sig Dispense Refill  . aspirin 81 MG chewable tablet Chew 81 mg by mouth daily.    Marland Kitchen atorvastatin (LIPITOR) 20 MG tablet Take 20 mg by mouth daily.    . cholecalciferol (VITAMIN D) 400 units TABS tablet Take 400 Units by mouth.    . Everolimus 0.5 MG TABS Take by mouth.    . gabapentin (NEURONTIN) 100 MG capsule Take 100 mg in the morning and 200 mg at night    . insulin lispro (HUMALOG) 100 UNIT/ML KiwkPen     . Insulin NPH Human,  Isophane, (NOVOLIN N [place]) Inject into the skin.    . Insulin Regular Human (NOVOLIN R IJ) Inject as directed.    Marland Kitchen levothyroxine (SYNTHROID, LEVOTHROID) 112 MCG tablet Take 112 mcg by mouth daily before breakfast.    . lisinopril (PRINIVIL,ZESTRIL) 5 MG tablet Take 5 mg by mouth daily.    Marland Kitchen omeprazole (PRILOSEC) 40 MG capsule Take 40 mg by mouth daily.  3  . predniSONE (DELTASONE) 5 MG tablet Take by mouth.    . tacrolimus (PROGRAF) 1 MG capsule Take 1 mg by mouth 2 (two) times daily.     No current facility-administered medications on file prior to visit.     There are no Patient Instructions on file for this visit. No follow-ups on file.   Kris Hartmann, NP

## 2018-09-27 ENCOUNTER — Encounter (INDEPENDENT_AMBULATORY_CARE_PROVIDER_SITE_OTHER): Payer: Self-pay | Admitting: Vascular Surgery

## 2018-09-27 NOTE — Progress Notes (Signed)
MRN : [id number]  Cindy Fischer is a 72 y.o. (05-28-1946) female who presents with chief complaint of  Chief Complaint  Patient presents with  . Follow-up    pt conv abi,right le arterial ultrasound results  .  History of Present Illness:   The patient is seen for evaluation of painful lower extremities and diminished pulses. Patient notes the pain is always associated with activity and is very consistent day today. Typically, the pain occurs at less than one block, progress is as activity continues to the point that the patient must stop walking. Resting including standing still for several minutes allowed resumption of the activity and the ability to walk a similar distance before stopping again. Uneven terrain and inclined shorten the distance. The pain has been progressive over the past several years. The patient states the inability to walk is now having a profound negative impact on quality of life and daily activities.  The patient is describing right leg rest pain but she is not dangling of an extremity off the side of the bed during the night for relief. No open wounds or sores at this time. No prior interventions or surgeries.  Duplex ultrasound of the right lower extremity demonstrates a monophasic SFA and biphasic femoral.  ABIs right equals 0.94 but with a monophasic signal and flat toes tracings left demonstrates noncompressibility with triphasic signals and strong toe tracings  Current Meds  Medication Sig  . aspirin 81 MG chewable tablet Chew 81 mg by mouth daily.  Marland Kitchen atorvastatin (LIPITOR) 20 MG tablet Take 20 mg by mouth daily.  . cholecalciferol (VITAMIN D) 400 units TABS tablet Take 400 Units by mouth.  . Everolimus 0.5 MG TABS Take by mouth.  . gabapentin (NEURONTIN) 100 MG capsule Take 100 mg in the morning and 200 mg at night  . Insulin NPH Human, Isophane, (NOVOLIN N [place]) Inject into the skin.  . Insulin Regular Human (NOVOLIN R IJ) Inject as directed.  Marland Kitchen  levothyroxine (SYNTHROID, LEVOTHROID) 112 MCG tablet Take 112 mcg by mouth daily before breakfast.  . lisinopril (PRINIVIL,ZESTRIL) 5 MG tablet Take 5 mg by mouth daily.  Marland Kitchen omeprazole (PRILOSEC) 40 MG capsule Take 40 mg by mouth daily.  . predniSONE (DELTASONE) 5 MG tablet Take by mouth.  . tacrolimus (PROGRAF) 1 MG capsule Take 1 mg by mouth 2 (two) times daily.  . [DISCONTINUED] ranitidine (ZANTAC) 150 MG tablet Take 150 mg by mouth 2 (two) times daily.    Past Medical History:  Diagnosis Date  . Cancer ([place]) 2016   skin  . Diabetes mellitus without complication ([place])   . Hypothyroidism   . Renal disorder     Past Surgical History:  Procedure Laterality Date  . COLONOSCOPY  12/02/2010  . COLONOSCOPY WITH PROPOFOL N/A 09/04/2016   Procedure: COLONOSCOPY WITH PROPOFOL;  Surgeon: Christene Lye, MD;  Location: [facility] ENDOSCOPY;  Service: Endoscopy;  Laterality: N/A;  . INSERTION OF DIALYSIS CATHETER    . 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 TONSILLECTOMY    . TUBAL LIGATION      Social History Social History   Tobacco Use  . Smoking status: Former Research scientist (life sciences)  . Smokeless tobacco: Never Used  Substance Use Topics  . Alcohol use: No  . Drug use: No    Family History Family History  Problem Relation Age of Onset  . Breast cancer Neg Hx     Allergies  Allergen Reactions  . Oxycodone-Acetaminophen Nausea And Vomiting  .  Penicillins Rash     REVIEW OF SYSTEMS (Negative unless checked)  Constitutional: [] Weight loss  [] Fever  [] Chills Cardiac: [] Chest pain   [] Chest pressure   [] Palpitations   [] Shortness of breath when laying flat   [] Shortness of breath with exertion. Vascular:  [x] Pain in legs with walking   [x] Pain in legs at rest  [] History of DVT   [] Phlebitis   [] Swelling in legs   [] Varicose veins   [] Non-healing ulcers Pulmonary:   [] Uses home oxygen   [] Productive cough   [] Hemoptysis   [] Wheeze  [] COPD   [] Asthma Neurologic:  [] Dizziness    [] Seizures   [] History of stroke   [] History of TIA  [] Aphasia   [] Vissual changes   [] Weakness or numbness in arm   [] Weakness or numbness in leg Musculoskeletal:   [] Joint swelling   [] Joint pain   [] Low back pain Hematologic:  [] Easy bruising  [] Easy bleeding   [] Hypercoagulable state   [] Anemic Gastrointestinal:  [] Diarrhea   [] Vomiting  [] Gastroesophageal reflux/heartburn   [] Difficulty swallowing. Genitourinary:  [x] Chronic kidney disease   [] Difficult urination  [] Frequent urination   [] Blood in urine Skin:  [] Rashes   [] Ulcers  Psychological:  [] History of anxiety   []  History of major depression.  Physical Examination  Vitals:   09/19/18 1535  BP: (!) 150/74  Pulse: 68  Resp: 16  Weight: 155 lb (70.3 kg)  Height: 5\' 2"  (1.575 m)   Body mass index is 28.35 kg/m. Gen: WD/WN, NAD Head: [place]/AT, No temporalis wasting.  Ear/Nose/Throat: Hearing grossly intact, nares w/o erythema or drainage Eyes: PER, EOMI, sclera nonicteric.  Neck: Supple, no large masses.   Pulmonary:  Good air movement, no audible wheezing bilaterally, no use of accessory muscles.  Cardiac: RRR, no JVD Vascular: Sluggish capillary refill right foot with forefoot cool to the touch Vessel Right Left  Radial Palpable Palpable  PT Not Palpable Trace Palpable  DP Not Palpable Trace Palpable  Gastrointestinal: Non-distended. No guarding/no peritoneal signs.  Musculoskeletal: M/S 5/5 throughout.  No deformity or atrophy.  Neurologic: CN 2-12 intact. Symmetrical.  Speech is fluent. Motor exam as listed above. Psychiatric: Judgment intact, Mood & affect appropriate for pt's clinical situation. Dermatologic: No rashes or ulcers noted.  No changes consistent with cellulitis. Lymph : No lichenification or skin changes of chronic lymphedema.  CBC Lab Results  Component Value Date   WBC 8.2 09/06/2018   HGB 13.9 09/06/2018   HCT 40.8 09/06/2018   MCV 85.3 09/06/2018   PLT 525 (H) 09/06/2018    BMET      Component Value Date/Time   NA 137 09/06/2018 0826   NA 138 03/29/2015 0918   K 4.3 09/06/2018 0826   K 4.5 03/29/2015 0918   CL 102 09/06/2018 0826   CL 104 03/29/2015 0918   CO2 26 09/06/2018 0826   CO2 26 03/29/2015 0918   GLUCOSE 67 (L) 09/06/2018 0826   GLUCOSE 187 (H) 03/29/2015 0918   BUN 12 09/06/2018 0826   BUN 10 03/29/2015 0918   CREATININE 0.73 09/06/2018 0826   CREATININE 0.80 03/29/2015 0918   CALCIUM 9.1 09/06/2018 0826   CALCIUM 9.1 03/29/2015 0918   GFRNONAA >60 09/06/2018 0826   GFRNONAA >60 03/29/2015 0918   GFRAA >60 09/06/2018 0826   GFRAA >60 03/29/2015 0918   CrCl cannot be calculated (Patient's most recent lab result is older than the maximum 21 days allowed.).  COAG No results found for: INR, PROTIME  Radiology No results found.  Assessment/Plan 1. Atherosclerosis of artery of extremity with rest pain ([place]) Recommend:  The patient has evidence of severe atherosclerotic changes of both lower extremities with rest pain of the right lower extremity that is associated with preulcerative changes and impending tissue loss of the right foot.  This represents a limb threatening ischemia and places the patient at the risk for limb loss.  Patient should undergo angiography of the right lower extremities with the hope for intervention for limb salvage.  The risks and benefits as well as the alternative therapies was discussed in detail with the patient.  All questions were answered.  Patient does not agree to proceed with angiography.  She is very concerned about her renal transplant and exposure to contrast.  She is requested that this be minimized in any possible fashion.  This was a lengthy discussion we have decided that we will now obtain a duplex ultrasound of the aorta iliac to see if we can localize the arterial lesion.  Based on the noninvasive studies today it would appear that she does have an inflow stenosis.  The patient will follow up with me in the  office after the procedure.     A total of 35 minutes was spent with this patient and greater than 50% was spent in counseling and coordination of care with the patient.  Discussion included the treatment options for vascular disease including indications for surgery and intervention.  Also discussed is the appropriate timing of treatment.  In addition medical therapy was discussed.  - VAS US AORTA/IVC/ILIACS; Future  2. Renal transplant, status post We will hydrate the patient and avoid nephrotoxic medications as much as possible we will also minimize the contrast exposure as best as we can.  All of this was discussed with the patient  3. Type 2 diabetes mellitus with other circulatory complication, with long-term current use of insulin (HCC) Continue hypoglycemic medications as already ordered, these medications have been reviewed and there are no changes at this time.  Hgb A1C to be monitored as already arranged by primary service   4. Mixed hyperlipidemia Continue statin as ordered and reviewed, no changes at this time     Hortencia Pilar, MD  09/27/2018 1:58 PM

## 2018-10-01 ENCOUNTER — Encounter (INDEPENDENT_AMBULATORY_CARE_PROVIDER_SITE_OTHER): Payer: Self-pay

## 2018-10-02 MED ORDER — LEVOTHYROXINE 100 MCG TABLET
ORAL_TABLET | Freq: Every day | ORAL | 3 refills | 0 days | Status: CP
Start: 2018-10-02 — End: 2019-10-02

## 2018-10-07 ENCOUNTER — Encounter: Admit: 2018-10-07 | Discharge: 2018-10-07 | Payer: MEDICARE | Attending: Nephrology | Primary: Nephrology

## 2018-10-07 ENCOUNTER — Other Ambulatory Visit
Admission: RE | Admit: 2018-10-07 | Discharge: 2018-10-07 | Disposition: A | Discharge status: Home / Self Care | Payer: Medicare Other | Source: Ambulatory Visit | Attending: Nephrology | Admitting: Nephrology

## 2018-10-07 DIAGNOSIS — Z789 Other specified health status: Secondary | ICD-10-CM | POA: Insufficient documentation

## 2018-10-07 DIAGNOSIS — D899 Disorder involving the immune mechanism, unspecified: Secondary | ICD-10-CM | POA: Insufficient documentation

## 2018-10-07 DIAGNOSIS — Z940 Kidney transplant status: Secondary | ICD-10-CM | POA: Insufficient documentation

## 2018-10-07 DIAGNOSIS — E559 Vitamin D deficiency, unspecified: Secondary | ICD-10-CM | POA: Insufficient documentation

## 2018-10-07 DIAGNOSIS — E1129 Type 2 diabetes mellitus with other diabetic kidney complication: Secondary | ICD-10-CM | POA: Insufficient documentation

## 2018-10-07 DIAGNOSIS — Z9483 Pancreas transplant status: Secondary | ICD-10-CM | POA: Insufficient documentation

## 2018-10-07 DIAGNOSIS — B259 Cytomegaloviral disease, unspecified: Secondary | ICD-10-CM | POA: Diagnosis not present

## 2018-10-07 DIAGNOSIS — N390 Urinary tract infection, site not specified: Secondary | ICD-10-CM | POA: Insufficient documentation

## 2018-10-07 DIAGNOSIS — D631 Anemia in chronic kidney disease: Secondary | ICD-10-CM | POA: Diagnosis not present

## 2018-10-07 DIAGNOSIS — Z09 Encounter for follow-up examination after completed treatment for conditions other than malignant neoplasm: Secondary | ICD-10-CM | POA: Insufficient documentation

## 2018-10-07 DIAGNOSIS — Z114 Encounter for screening for human immunodeficiency virus [HIV]: Secondary | ICD-10-CM | POA: Insufficient documentation

## 2018-10-07 DIAGNOSIS — Z79899 Other long term (current) drug therapy: Secondary | ICD-10-CM | POA: Diagnosis present

## 2018-10-07 LAB — BASIC METABOLIC PANEL
ANION GAP: 9 (ref 5–15)
BUN: 11 mg/dL (ref 8–23)
CALCIUM: 9.1 mg/dL (ref 8.9–10.3)
CO2: 26 mmol/L (ref 22–32)
CREATININE: 0.65 mg/dL (ref 0.44–1.00)
Chloride: 105 mmol/L (ref 98–111)
Glucose, Bld: 71 mg/dL (ref 70–99)
Potassium: 3.4 mmol/L — ABNORMAL LOW (ref 3.5–5.1)
Sodium: 140 mmol/L (ref 135–145)

## 2018-10-07 LAB — CBC WITH DIFFERENTIAL/PLATELET
ABS IMMATURE GRANULOCYTES: 0.1 10*3/uL — AB (ref 0.00–0.07)
Basophils Absolute: 0 10*3/uL (ref 0.0–0.1)
Basophils Relative: 0 %
EOS PCT: 1 %
Eosinophils Absolute: 0.1 10*3/uL (ref 0.0–0.5)
HEMATOCRIT: 43.5 % (ref 36.0–46.0)
HEMOGLOBIN: 14.2 g/dL (ref 12.0–15.0)
Immature Granulocytes: 1 %
LYMPHS ABS: 1.8 10*3/uL (ref 0.7–4.0)
LYMPHS PCT: 22 %
MCH: 28.5 pg (ref 26.0–34.0)
MCHC: 32.6 g/dL (ref 30.0–36.0)
MCV: 87.3 fL (ref 80.0–100.0)
MONOS PCT: 10 %
Monocytes Absolute: 0.8 10*3/uL (ref 0.1–1.0)
NEUTROS ABS: 5.6 10*3/uL (ref 1.7–7.7)
Neutrophils Relative %: 66 %
Platelets: 528 10*3/uL — ABNORMAL HIGH (ref 150–400)
RBC: 4.98 MIL/uL (ref 3.87–5.11)
RDW: 15.3 % (ref 11.5–15.5)
WBC: 8.5 10*3/uL (ref 4.0–10.5)
nRBC: 0 % (ref 0.0–0.2)

## 2018-10-07 LAB — PHOSPHORUS: Phosphorus: 4.1 mg/dL (ref 2.5–4.6)

## 2018-10-07 LAB — MAGNESIUM: MAGNESIUM: 1.9 mg/dL (ref 1.7–2.4)

## 2018-10-08 ENCOUNTER — Other Ambulatory Visit (INDEPENDENT_AMBULATORY_CARE_PROVIDER_SITE_OTHER): Payer: Self-pay | Admitting: Vascular Surgery

## 2018-10-10 DIAGNOSIS — Z79899 Other long term (current) drug therapy: Secondary | ICD-10-CM

## 2018-10-10 DIAGNOSIS — Z940 Kidney transplant status: Principal | ICD-10-CM

## 2018-10-15 ENCOUNTER — Encounter
Admission: RE | Admit: 2018-10-15 | Discharge: 2018-10-15 | Disposition: A | Discharge status: Home / Self Care | Payer: Medicare Other | Source: Ambulatory Visit | Attending: Vascular Surgery | Admitting: Vascular Surgery

## 2018-10-15 ENCOUNTER — Other Ambulatory Visit: Payer: Self-pay

## 2018-10-15 DIAGNOSIS — Z7982 Long term (current) use of aspirin: Secondary | ICD-10-CM | POA: Diagnosis not present

## 2018-10-15 DIAGNOSIS — Z87891 Personal history of nicotine dependence: Secondary | ICD-10-CM | POA: Diagnosis not present

## 2018-10-15 DIAGNOSIS — Z940 Kidney transplant status: Secondary | ICD-10-CM | POA: Diagnosis not present

## 2018-10-15 DIAGNOSIS — Z880 Allergy status to penicillin: Secondary | ICD-10-CM | POA: Diagnosis not present

## 2018-10-15 DIAGNOSIS — Z794 Long term (current) use of insulin: Secondary | ICD-10-CM | POA: Diagnosis not present

## 2018-10-15 DIAGNOSIS — E039 Hypothyroidism, unspecified: Secondary | ICD-10-CM | POA: Diagnosis not present

## 2018-10-15 DIAGNOSIS — E782 Mixed hyperlipidemia: Secondary | ICD-10-CM | POA: Diagnosis not present

## 2018-10-15 DIAGNOSIS — Z85828 Personal history of other malignant neoplasm of skin: Secondary | ICD-10-CM | POA: Diagnosis not present

## 2018-10-15 DIAGNOSIS — Z9851 Tubal ligation status: Secondary | ICD-10-CM | POA: Diagnosis not present

## 2018-10-15 DIAGNOSIS — E1159 Type 2 diabetes mellitus with other circulatory complications: Secondary | ICD-10-CM | POA: Diagnosis not present

## 2018-10-15 DIAGNOSIS — Z79890 Hormone replacement therapy (postmenopausal): Secondary | ICD-10-CM | POA: Diagnosis not present

## 2018-10-15 DIAGNOSIS — Z79899 Other long term (current) drug therapy: Secondary | ICD-10-CM | POA: Diagnosis not present

## 2018-10-15 DIAGNOSIS — I70221 Atherosclerosis of native arteries of extremities with rest pain, right leg: Secondary | ICD-10-CM | POA: Diagnosis not present

## 2018-10-15 DIAGNOSIS — Z905 Acquired absence of kidney: Secondary | ICD-10-CM | POA: Diagnosis not present

## 2018-10-15 DIAGNOSIS — Z9889 Other specified postprocedural states: Secondary | ICD-10-CM | POA: Diagnosis not present

## 2018-10-15 DIAGNOSIS — N289 Disorder of kidney and ureter, unspecified: Secondary | ICD-10-CM | POA: Diagnosis not present

## 2018-10-15 HISTORY — DX: Pneumonia, unspecified organism: J18.9

## 2018-10-15 LAB — BUN: BUN: 11 mg/dL (ref 8–23)

## 2018-10-15 LAB — CREATININE, SERUM
CREATININE: 0.74 mg/dL (ref 0.44–1.00)
GFR calc Af Amer: >60 mL/min (ref >60)
GFR calc non Af Amer: >60 mL/min (ref >60)

## 2018-10-15 MED ORDER — CLINDAMYCIN PHOSPHATE 300 MG/50ML IV SOLN
300.0000 mg | Freq: Once | INTRAVENOUS | Status: AC
  Administered 2018-10-16: 300 mg via INTRAVENOUS

## 2018-10-15 NOTE — Patient Instructions (Signed)
FOLLOW INSTRUCTIONS GIVEN TO YOU BY [facility].  Your procedure is scheduled with Dr. Delana Meyer.                           On:  Wednesday, October 16, 2018    Go to '[facility]' on the first floor of the [facility].  Do not eat or drink 8 hours prior to your procedure.    Please call Dr Nino Parsley office with any questions or concerns: [phone].  You will need to have someone drive you home and stay with you the night of the procedure.

## 2018-10-16 ENCOUNTER — Encounter
Admission: RE | Disposition: A | Discharge status: Home / Self Care | Payer: Self-pay | Source: Ambulatory Visit | Attending: Vascular Surgery

## 2018-10-16 ENCOUNTER — Ambulatory Visit
Admission: RE | Admit: 2018-10-16 | Discharge: 2018-10-16 | Disposition: A | Discharge status: Home / Self Care | Payer: Medicare Other | Source: Ambulatory Visit | Attending: Vascular Surgery | Admitting: Vascular Surgery

## 2018-10-16 ENCOUNTER — Other Ambulatory Visit: Payer: Self-pay

## 2018-10-16 DIAGNOSIS — Z79899 Other long term (current) drug therapy: Secondary | ICD-10-CM | POA: Insufficient documentation

## 2018-10-16 DIAGNOSIS — Z85828 Personal history of other malignant neoplasm of skin: Secondary | ICD-10-CM | POA: Insufficient documentation

## 2018-10-16 DIAGNOSIS — Z940 Kidney transplant status: Secondary | ICD-10-CM

## 2018-10-16 DIAGNOSIS — N289 Disorder of kidney and ureter, unspecified: Secondary | ICD-10-CM | POA: Insufficient documentation

## 2018-10-16 DIAGNOSIS — I70229 Atherosclerosis of native arteries of extremities with rest pain, unspecified extremity: Secondary | ICD-10-CM

## 2018-10-16 DIAGNOSIS — Z79890 Hormone replacement therapy (postmenopausal): Secondary | ICD-10-CM | POA: Insufficient documentation

## 2018-10-16 DIAGNOSIS — Z9851 Tubal ligation status: Secondary | ICD-10-CM | POA: Insufficient documentation

## 2018-10-16 DIAGNOSIS — E782 Mixed hyperlipidemia: Secondary | ICD-10-CM | POA: Insufficient documentation

## 2018-10-16 DIAGNOSIS — Z9889 Other specified postprocedural states: Secondary | ICD-10-CM | POA: Insufficient documentation

## 2018-10-16 DIAGNOSIS — Z7982 Long term (current) use of aspirin: Secondary | ICD-10-CM | POA: Insufficient documentation

## 2018-10-16 DIAGNOSIS — E039 Hypothyroidism, unspecified: Secondary | ICD-10-CM | POA: Insufficient documentation

## 2018-10-16 DIAGNOSIS — Z87891 Personal history of nicotine dependence: Secondary | ICD-10-CM | POA: Insufficient documentation

## 2018-10-16 DIAGNOSIS — I70221 Atherosclerosis of native arteries of extremities with rest pain, right leg: Secondary | ICD-10-CM | POA: Diagnosis not present

## 2018-10-16 DIAGNOSIS — Z880 Allergy status to penicillin: Secondary | ICD-10-CM | POA: Insufficient documentation

## 2018-10-16 DIAGNOSIS — E1159 Type 2 diabetes mellitus with other circulatory complications: Secondary | ICD-10-CM | POA: Insufficient documentation

## 2018-10-16 DIAGNOSIS — Z794 Long term (current) use of insulin: Secondary | ICD-10-CM | POA: Insufficient documentation

## 2018-10-16 DIAGNOSIS — Z905 Acquired absence of kidney: Secondary | ICD-10-CM | POA: Insufficient documentation

## 2018-10-16 HISTORY — PX: LOWER EXTREMITY ANGIOGRAPHY: CATH118251

## 2018-10-16 LAB — GLUCOSE, CAPILLARY
GLUCOSE-CAPILLARY: 89 mg/dL (ref 70–99)
Glucose-Capillary: 69 mg/dL — ABNORMAL LOW (ref 70–99)
Glucose-Capillary: 124 mg/dL — ABNORMAL HIGH (ref 70–99)

## 2018-10-16 SURGERY — LOWER EXTREMITY ANGIOGRAPHY
Anesthesia: Moderate Sedation | Laterality: Right

## 2018-10-16 MED ORDER — FENTANYL CITRATE (PF) 100 MCG/2ML IJ SOLN
INTRAMUSCULAR | Status: DC | PRN
  Administered 2018-10-16 (×2): 25 ug via INTRAVENOUS
  Administered 2018-10-16 (×2): 50 ug via INTRAVENOUS

## 2018-10-16 MED ORDER — ONDANSETRON HCL 4 MG/2ML IJ SOLN: 4.0000 mg | Freq: Four times a day (QID) | INTRAMUSCULAR | Status: DC | PRN

## 2018-10-16 MED ORDER — MORPHINE SULFATE (PF) 4 MG/ML IV SOLN: 2.0000 mg | INTRAVENOUS | Status: DC | PRN

## 2018-10-16 MED ORDER — HEPARIN SODIUM (PORCINE) 1000 UNIT/ML IJ SOLN
INTRAMUSCULAR | Status: DC | PRN
  Administered 2018-10-16: 5000 [IU] via INTRAVENOUS

## 2018-10-16 MED ORDER — SODIUM CHLORIDE FLUSH 0.9 % IV SOLN
INTRAVENOUS | Status: AC
  Filled 2018-10-16: qty 20

## 2018-10-16 MED ORDER — SODIUM CHLORIDE 0.9 % IV SOLN: 250.0000 mL | INTRAVENOUS | Status: DC | PRN

## 2018-10-16 MED ORDER — SODIUM CHLORIDE 0.9% FLUSH: 3.0000 mL | INTRAVENOUS | Status: DC | PRN

## 2018-10-16 MED ORDER — CLOPIDOGREL BISULFATE 300 MG PO TABS
300.0000 mg | ORAL_TABLET | ORAL | Status: AC
  Administered 2018-10-16: 300 mg via ORAL

## 2018-10-16 MED ORDER — SODIUM CHLORIDE 0.9 % IV SOLN
INTRAVENOUS | Status: DC
  Administered 2018-10-16: 11:00:00 via INTRAVENOUS

## 2018-10-16 MED ORDER — ATORVASTATIN CALCIUM 10 MG PO TABS: 10.0000 mg | ORAL_TABLET | Freq: Every day | ORAL | Status: DC

## 2018-10-16 MED ORDER — CLINDAMYCIN PHOSPHATE 300 MG/50ML IV SOLN
INTRAVENOUS | Status: AC
  Administered 2018-10-16: 300 mg via INTRAVENOUS
  Filled 2018-10-16: qty 50

## 2018-10-16 MED ORDER — CLOPIDOGREL BISULFATE 75 MG PO TABS
ORAL_TABLET | ORAL | Status: AC
  Filled 2018-10-16: qty 4

## 2018-10-16 MED ORDER — SODIUM CHLORIDE 0.9% FLUSH: 3.0000 mL | Freq: Two times a day (BID) | INTRAVENOUS | Status: DC

## 2018-10-16 MED ORDER — DIPHENHYDRAMINE HCL 50 MG/ML IJ SOLN
INTRAMUSCULAR | Status: DC | PRN
  Administered 2018-10-16: 25 mg via INTRAVENOUS

## 2018-10-16 MED ORDER — FENTANYL CITRATE (PF) 100 MCG/2ML IJ SOLN
INTRAMUSCULAR | Status: AC
  Filled 2018-10-16: qty 2

## 2018-10-16 MED ORDER — LABETALOL HCL 5 MG/ML IV SOLN: 10.0000 mg | INTRAVENOUS | Status: DC | PRN

## 2018-10-16 MED ORDER — HYDROMORPHONE HCL 1 MG/ML IJ SOLN: 1.0000 mg | Freq: Once | INTRAMUSCULAR | Status: DC | PRN

## 2018-10-16 MED ORDER — CLOPIDOGREL BISULFATE 75 MG PO TABS: 75.0000 mg | ORAL_TABLET | Freq: Every day | ORAL | 4 refills | Status: DC

## 2018-10-16 MED ORDER — HEPARIN (PORCINE) IN NACL 1000-0.9 UT/500ML-% IV SOLN
INTRAVENOUS | Status: AC
  Filled 2018-10-16: qty 1000

## 2018-10-16 MED ORDER — CLINDAMYCIN PHOSPHATE 300 MG/50ML IV SOLN
INTRAVENOUS | Status: AC
  Filled 2018-10-16: qty 50

## 2018-10-16 MED ORDER — METHYLPREDNISOLONE SODIUM SUCC 125 MG IJ SOLR: 125.0000 mg | INTRAMUSCULAR | Status: DC | PRN

## 2018-10-16 MED ORDER — DIPHENHYDRAMINE HCL 50 MG/ML IJ SOLN
INTRAMUSCULAR | Status: AC
  Filled 2018-10-16: qty 1

## 2018-10-16 MED ORDER — IOPAMIDOL (ISOVUE-300) INJECTION 61%
INTRAVENOUS | Status: DC | PRN
  Administered 2018-10-16: 60 mL via INTRA_ARTERIAL

## 2018-10-16 MED ORDER — MIDAZOLAM HCL 2 MG/2ML IJ SOLN
INTRAMUSCULAR | Status: DC | PRN
  Administered 2018-10-16 (×2): 1 mg via INTRAVENOUS
  Administered 2018-10-16: 2 mg via INTRAVENOUS
  Administered 2018-10-16: 1 mg via INTRAVENOUS

## 2018-10-16 MED ORDER — DEXTROSE 50 % IV SOLN
25.0000 mL | Freq: Once | INTRAVENOUS | Status: AC
  Administered 2018-10-16: 25 mL via INTRAVENOUS

## 2018-10-16 MED ORDER — HEPARIN SODIUM (PORCINE) 1000 UNIT/ML IJ SOLN
INTRAMUSCULAR | Status: AC
  Filled 2018-10-16: qty 1

## 2018-10-16 MED ORDER — FAMOTIDINE 20 MG PO TABS: 40.0000 mg | ORAL_TABLET | ORAL | Status: DC | PRN

## 2018-10-16 MED ORDER — MIDAZOLAM HCL 5 MG/5ML IJ SOLN
INTRAMUSCULAR | Status: AC
  Filled 2018-10-16: qty 5

## 2018-10-16 MED ORDER — DEXTROSE 50 % IV SOLN
INTRAVENOUS | Status: AC
  Administered 2018-10-16: 25 mL via INTRAVENOUS
  Filled 2018-10-16: qty 50

## 2018-10-16 MED ORDER — SODIUM CHLORIDE 0.9 % IV SOLN: INTRAVENOUS | Status: DC

## 2018-10-16 MED ORDER — HYDRALAZINE HCL 20 MG/ML IJ SOLN: 5.0000 mg | INTRAMUSCULAR | Status: DC | PRN

## 2018-10-16 SURGICAL SUPPLY — 29 items
BALLN LUTONIX 4X220X130 (BALLOONS) ×3
BALLN LUTONIX 5X120X130 (BALLOONS) ×3
BALLN LUTONIX DCB 5X40X130 (BALLOONS) ×3
BALLOON LUTONIX 4X220X130 (BALLOONS) ×1 IMPLANT
BALLOON LUTONIX 5X120X130 (BALLOONS) ×1 IMPLANT
BALLOON LUTONIX DCB 5X40X130 (BALLOONS) ×1 IMPLANT
CATH BEACON 5 .035 65 RIM TIP (CATHETERS) ×3 IMPLANT
CATH BEACON 5 .038 100 VERT TP (CATHETERS) ×3 IMPLANT
CATH CROSSER 14S OTW 146CM (CATHETERS) ×3 IMPLANT
CATH PIG 70CM (CATHETERS) ×3 IMPLANT
CATH SIDEKICK XL ST 110CM (SHEATH) ×3 IMPLANT
DEVICE PRESTO INFLATION (MISCELLANEOUS) ×3 IMPLANT
DEVICE STARCLOSE SE CLOSURE (Vascular Products) ×3 IMPLANT
GLIDEWIRE ADV .035X260CM (WIRE) ×3 IMPLANT
IV NS 500ML (IV SOLUTION) ×2
IV NS 500ML BAXH (IV SOLUTION) ×1 IMPLANT
KIT FLOWMATE PROCEDURAL (KITS) ×3 IMPLANT
NEEDLE ENTRY 21GA 7CM ECHOTIP (NEEDLE) ×3 IMPLANT
PACK ANGIOGRAPHY (CUSTOM PROCEDURE TRAY) ×3 IMPLANT
SET INTRO CAPELLA COAXIAL (SET/KITS/TRAYS/PACK) ×3 IMPLANT
SHEATH BRITE TIP 5FRX11 (SHEATH) ×3 IMPLANT
SHEATH RAABE 6FR (SHEATH) ×3 IMPLANT
SHIELD RADPAD SCOOP 12X17 (MISCELLANEOUS) ×3 IMPLANT
SHIELD X-DRAPE GOLD 12X17 (MISCELLANEOUS) ×3 IMPLANT
STENT LIFESTENT 5F 6X120X135 (Permanent Stent) ×3 IMPLANT
SYR MEDRAD MARK V 150ML (SYRINGE) ×3 IMPLANT
TUBING CONTRAST HIGH PRESS 72 (TUBING) ×3 IMPLANT
WIRE AQUATRACK .035X260CM (WIRE) ×3 IMPLANT
WIRE J 3MM .035X145CM (WIRE) ×3 IMPLANT

## 2018-10-16 NOTE — Progress Notes (Signed)
Pt. Ambulated to BR, then back down hallway to room. Left groin without any complications noted. Pt. And sister verbalize understanding of DC instructions. Stable for DC home. RX given to pt. Sister.

## 2018-10-16 NOTE — Op Note (Signed)
[facility] Percutaneous Study/Intervention Procedural Note   Date of Surgery: 10/16/2018  Surgeon:  Katha Cabal, MD.  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 Introduction catheter into right lower extremity 3rd order catheter placement  2. Contrast injection right lower extremity for distal runoff   3. Crosser atherectomy of the right SFA and popliteal arteries 4.  Percutaneous transluminal angioplasty and stent placement right superficial femoral artery and popliteal             5.   Star close closure left common femoral arteriotomy  Anesthesia: Conscious sedation was administered by the radiology RN under my direct supervision. IV Versed plus fentanyl were utilized. Continuous ECG, pulse oximetry and blood pressure was monitored throughout the entire procedure. Conscious sedation was for a total of 89 minutes.  Sheath: 6 [place] Ansell left common femoral retrograde  Contrast: 60 cc  Fluoroscopy Time: 8.8 minutes  Indications: ISOLA MEHLMAN presents with ischemic rest pain of the right lower extremity.  Physical examination as well as noninvasive studies support severe atherosclerotic occlusive disease.  Patient is at increasing risk for limb loss.  The risks and benefits are reviewed all questions answered patient agrees to proceed with angiography for intervention and limb salvage.  Procedure: DAVEIGH BATTY is a 72 y.o. y.o. female who was identified and appropriate procedural time out was performed. The patient was then placed supine on the table and prepped and draped in the usual sterile fashion.   Ultrasound was placed in the sterile sleeve and the left groin was evaluated the left common femoral artery was echolucent and pulsatile indicating patency.  Image was  recorded for the permanent record and under real-time visualization a microneedle was inserted into the common femoral artery microwire followed by a micro-sheath.  A J-wire was then advanced through the micro-sheath and a  5 [place] sheath was then inserted over a J-wire. J-wire was then advanced and a 5 French pigtail catheter was positioned at the level of T12. AP projection of the aorta was then obtained. Pigtail catheter was repositioned to above the bifurcation and a LAO view of the pelvis was obtained.  Subsequently a rim catheter with the stiff angle Glidewire was used to cross the aortic bifurcation the catheter wire were advanced down into the right distal external iliac artery. Oblique view of the femoral bifurcation was then obtained and subsequently the wire was reintroduced and the pigtail catheter negotiated into the SFA representing third order catheter placement. Distal runoff was then performed.  5000 units of heparin was then given and allowed to circulate and a 6 [place] Ansell sheath was advanced up and over the bifurcation and positioned in the femoral artery  The 14 S Crosser catheter was then prepped on the field and a straight micro catheter was advanced into the cul-de-sac of the SFA under magnified imaging in the LAO projection. Using the Crosser catheter the occlusion of the SFA and popliteal was negotiated.  Injection of contrast through the Crosser side port confirmed intraluminal positioning.  Kumpe catheter and stiff angle Glidewire were then advanced down into the distal popliteal.  Distal runoff was then completed by hand injection through the catheter.s negotiated and hand injection of contrast was used to verify intraluminal placement distally.    A 4 x 150 Ultraverse balloon was used to angioplasty the superficial femoral and popliteal arteries. Inflations were to 10 atmospheres  for 2 minutes. Follow-up imaging demonstrated patency with adequate preparation of the vessel for  a drug-coated balloon.  Subsequently, a 5 mm x 150 mm Lutonix balloon was utilized inflating to 12 atm for 2 full minutes. Follow-up imaging demonstrated greater than 50% residual stenosis and therefore a 6 mm x 150 mm life stent was deployed and subsequently postdilated with a 5 mm Lutonix balloon.  Distal runoff was then reassessed.  After review of these images the sheath is pulled into the left external iliac oblique of the common femoral is obtained and a Star close device deployed. There no immediate Complications.  Findings: The abdominal aorta is opacified with a bolus injection contrast. Renal arteries are nonvisualized consistent with her history of renal failure. The aorta itself has diffuse disease but no hemodynamically significant lesions. The common and external iliac arteries are patent bilaterally.  There appears to be an eccentric plaque at the origin of the right common iliac.  It does not appear to be flow-limiting and therefore I elected not to treat at this time.  Also noted is a kidney transplant with its arterial anastomosis in the proximal right external iliac.  Renal artery is widely patent anastomosis is widely patent.  The right common femoral is widely patent as is the profunda femoris.  The SFA is occluded at Hunter's canal.  The mid popliteal reconstitutes and the distal popliteal demonstrates mild disease and the trifurcation is mildly diseased with patency of all 3 tibial vessels  Angioplasty of the SFA at Hunter's canal has greater than 50% residual stenosis and therefore a life stent is deployed across this lesion and postdilated to 5 mm.  This yields an excellent result with less than 10% residual stenosis.  There is preservation of three-vessel runoff to the foot.    Disposition: Patient was taken to the recovery room in stable condition having tolerated the procedure well.  Belenda Cruise Schnier 10/16/2018,1:49 PM

## 2018-10-16 NOTE — H&P (Signed)
[facility] History & Physical Update  The patient was interviewed and re-examined.  The patient's previous History and Physical has been reviewed and is unchanged.  There is no change in the plan of care. We plan to proceed with the scheduled procedure.  Hortencia Pilar, MD  10/16/2018, 12:08 PM

## 2018-10-18 ENCOUNTER — Encounter: Payer: Self-pay | Admitting: Vascular Surgery

## 2018-10-30 MED ORDER — EVEROLIMUS (IMMUNOSUPPRESSIVE) 0.5 MG TABLET
ORAL_TABLET | Freq: Two times a day (BID) | ORAL | 5 refills | 0 days | Status: CP
Start: 2018-10-30 — End: 2018-11-06

## 2018-11-04 ENCOUNTER — Ambulatory Visit (INDEPENDENT_AMBULATORY_CARE_PROVIDER_SITE_OTHER): Payer: Medicare Other

## 2018-11-04 ENCOUNTER — Encounter (INDEPENDENT_AMBULATORY_CARE_PROVIDER_SITE_OTHER): Payer: Self-pay | Admitting: Vascular Surgery

## 2018-11-04 ENCOUNTER — Ambulatory Visit (INDEPENDENT_AMBULATORY_CARE_PROVIDER_SITE_OTHER): Payer: Medicare Other | Admitting: Vascular Surgery

## 2018-11-04 ENCOUNTER — Other Ambulatory Visit (INDEPENDENT_AMBULATORY_CARE_PROVIDER_SITE_OTHER): Payer: Self-pay | Admitting: Vascular Surgery

## 2018-11-04 VITALS — BP 153/75 | HR 54 | Resp 16 | Ht 63.0 in | Wt 155.6 lb

## 2018-11-04 DIAGNOSIS — M79604 Pain in right leg: Secondary | ICD-10-CM | POA: Diagnosis not present

## 2018-11-04 DIAGNOSIS — I70221 Atherosclerosis of native arteries of extremities with rest pain, right leg: Secondary | ICD-10-CM

## 2018-11-04 DIAGNOSIS — E1159 Type 2 diabetes mellitus with other circulatory complications: Secondary | ICD-10-CM | POA: Diagnosis not present

## 2018-11-04 DIAGNOSIS — E782 Mixed hyperlipidemia: Secondary | ICD-10-CM

## 2018-11-04 DIAGNOSIS — Z95820 Peripheral vascular angioplasty status with implants and grafts: Secondary | ICD-10-CM | POA: Diagnosis not present

## 2018-11-04 DIAGNOSIS — I70213 Atherosclerosis of native arteries of extremities with intermittent claudication, bilateral legs: Secondary | ICD-10-CM

## 2018-11-04 DIAGNOSIS — Z794 Long term (current) use of insulin: Secondary | ICD-10-CM

## 2018-11-06 MED ORDER — EVEROLIMUS (IMMUNOSUPPRESSIVE) 0.5 MG TABLET: 1 mg | tablet | Freq: Two times a day (BID) | 5 refills | 0 days | Status: AC

## 2018-11-06 MED ORDER — EVEROLIMUS (IMMUNOSUPPRESSIVE) 0.5 MG TABLET
ORAL_TABLET | Freq: Two times a day (BID) | ORAL | 5 refills | 0.00000 days | Status: CP
Start: 2018-11-06 — End: 2019-05-05

## 2018-11-09 ENCOUNTER — Encounter (INDEPENDENT_AMBULATORY_CARE_PROVIDER_SITE_OTHER): Payer: Self-pay | Admitting: Vascular Surgery

## 2018-11-09 NOTE — Progress Notes (Signed)
MRN : [id number]  Cindy Fischer is a 72 y.o. (1946/05/07) female who presents with chief complaint of  Chief Complaint  Patient presents with  . Follow-up    [facility] 2week abi  .  History of Present Illness:   The patient returns to the office for followup and review status post angiogram with intervention.   Procedure(s) Performed 10/16/2018: 1. Introduction catheter into right lower extremity 3rd order catheter placement  2. Contrast injection right lower extremity for distal runoff   3. Crosser atherectomy of the right SFA and popliteal arteries 4.  Percutaneous transluminal angioplasty and stent placement right superficial femoral artery and popliteal             5.   Star close closure left common femoral arteriotomy  The patient notes improvement in the lower extremity symptoms. No interval shortening of the patient's claudication distance or rest pain symptoms. Previous wounds have now healed.  No new ulcers or wounds have occurred since the last visit.  There have been no significant changes to the patient's overall health care.  The patient denies amaurosis fugax or recent TIA symptoms. There are no recent neurological changes noted. The patient denies history of DVT, PE or superficial thrombophlebitis. The patient denies recent episodes of angina or shortness of breath.   ABI's Rt=[place] and Lt=[place]  (triphasic signals)   Current Meds  Medication Sig  . aspirin EC 81 MG tablet Take 81 mg by mouth daily.  Marland Kitchen atorvastatin (LIPITOR) 20 MG tablet Take 20 mg by mouth daily.  . Cholecalciferol (VITAMIN D) 2000 units tablet Take 2,000 Units by mouth daily.  . clopidogrel (PLAVIX) 75 MG tablet Take 1 tablet (75 mg total) by mouth daily.  . Everolimus 0.25 MG TABS Take 0.5 mg by mouth 2 (two) times daily.  Marland Kitchen gabapentin (NEURONTIN) 100 MG capsule Take 100 mg by mouth 2 (two) times daily.   . insulin NPH Human (HUMULIN N,NOVOLIN N)  100 UNIT/ML injection Inject 30 Units into the skin daily.  . insulin regular (NOVOLIN R,HUMULIN R) 100 units/mL injection Inject 3-8 Units into the skin 3 (three) times daily before meals.  Marland Kitchen levothyroxine (SYNTHROID, LEVOTHROID) 100 MCG tablet Take 100 mcg by mouth daily before breakfast.  . lisinopril (PRINIVIL,ZESTRIL) 10 MG tablet Take 10 mg by mouth daily.  Marland Kitchen omeprazole (PRILOSEC) 40 MG capsule Take 40 mg by mouth every other day.   . predniSONE (DELTASONE) 5 MG tablet Take 5 mg by mouth daily.   . tacrolimus (PROGRAF) 0.5 MG capsule Take 1-1.5 mg by mouth See admin instructions. Take 1.5 mg in the morning and 1 mg in the evening    Past Medical History:  Diagnosis Date  . Cancer ([place]) 2016   skin (nose)  . Diabetes mellitus without complication ([place])   . Hypothyroidism   . Pneumonia   . Renal disorder     Past Surgical History:  Procedure Laterality Date  . CATARACT EXTRACTION, BILATERAL    . COLONOSCOPY  12/02/2010  . COLONOSCOPY WITH PROPOFOL N/A 09/04/2016   Procedure: COLONOSCOPY WITH PROPOFOL;  Surgeon: Christene Lye, MD;  Location: [facility] ENDOSCOPY;  Service: Endoscopy;  Laterality: N/A;  . EYE SURGERY    . INSERTION OF DIALYSIS CATHETER  2014   removed 2014 (only had in for about 8 weeks)  . laser surgery on eyes  1999  . LOWER EXTREMITY ANGIOGRAPHY Right 10/16/2018   Procedure: LOWER EXTREMITY ANGIOGRAPHY;  Surgeon: Katha Cabal, MD;  Location:  [place] [facility];  Service: Cardiovascular;  Laterality: Right;  . 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 TONSILLECTOMY    . TUBAL LIGATION      Social History Social History   Tobacco Use  . Smoking status: Former Smoker    Types: Cigarettes  . Smokeless tobacco: Never Used  Substance Use Topics  . Alcohol use: No  . Drug use: No    Family History Family History  Problem Relation Age of Onset  . Multiple myeloma Mother   . Diabetes Father   . Heart attack Father   . Breast cancer Neg  Hx     Allergies  Allergen Reactions  . Oxycodone-Acetaminophen Nausea And Vomiting  . Penicillins Rash    Has patient had a PCN reaction causing immediate rash, facial/tongue/throat swelling, SOB or lightheadedness with hypotension: Yes Has patient had a PCN reaction causing severe rash involving mucus membranes or skin necrosis: No Has patient had a PCN reaction that required hospitalization: No Has patient had a PCN reaction occurring within the last 10 years: No If all of the above answers are "NO", then may proceed with Cephalosporin use.      REVIEW OF SYSTEMS (Negative unless checked)  Constitutional: _0 Weight loss  _1 Fever  _2 Chills Cardiac: _3 Chest pain   _4 Chest pressure   _5 Palpitations   _6 Shortness of breath when laying flat   _7 Shortness of breath with exertion. Vascular:  _8 Pain in legs with walking   _9 Pain in legs at rest  _10 History of DVT   _11 Phlebitis   _12 Swelling in legs   _13 Varicose veins   _14 Non-healing ulcers Pulmonary:   _15 Uses home oxygen   _16 Productive cough   _17 Hemoptysis   _18 Wheeze  _19 COPD   _20 Asthma Neurologic:  _21 Dizziness   _22 Seizures   _23 History of stroke   _24 History of TIA  _25 Aphasia   _26 Vissual changes   _27 Weakness or numbness in arm   _28 Weakness or numbness in leg Musculoskeletal:   _29 Joint swelling   _30 Joint pain   _31 Low back pain Hematologic:  _32 Easy bruising  _33 Easy bleeding   _34 Hypercoagulable state   _35 Anemic Gastrointestinal:  _36 Diarrhea   _37 Vomiting  _38 Gastroesophageal reflux/heartburn   _39 Difficulty swallowing. Genitourinary:  _40 Chronic kidney disease   _41 Difficult urination  _42 Frequent urination   _43 Blood in urine Skin:  _44 Rashes   _45 Ulcers  Psychological:  _46 History of anxiety   _47  History of major depression.  Physical Examination  Vitals:   11/04/18 1137  BP: (!) 153/75  Pulse: (!) 54  Resp: 16  Weight: 155 lb 9.6 oz (70.6 kg)  Height: _48  (1.6 m)   Body mass index is 27.56 kg/m. Gen: WD/WN, NAD Head: [place]/AT, No temporalis  wasting.  Ear/Nose/Throat: Hearing grossly intact, nares w/o erythema or drainage Eyes: PER, EOMI, sclera nonicteric.  Neck: Supple, no large masses.   Pulmonary:  Good air movement, no audible wheezing bilaterally, no use of accessory muscles.  Cardiac: RRR, no JVD Vascular: both feet pink and warm Vessel Right Left  Radial Palpable Palpable  PT Palpable Palpable  DP Palpable Palpable  Gastrointestinal: Non-distended. No guarding/no peritoneal signs.  Musculoskeletal: M/S 5/5 throughout.  No deformity or atrophy.  Neurologic: CN 2-12 intact. Symmetrical.  Speech is fluent. Motor exam as listed above. Psychiatric: Judgment intact, Mood & affect appropriate for pt's clinical situation. Dermatologic: No rashes or ulcers noted.  No changes consistent with cellulitis. Lymph : No lichenification or skin changes of chronic lymphedema.  CBC Lab Results  Component Value Date   WBC  8.5 10/07/2018   HGB 14.2 10/07/2018   HCT 43.5 10/07/2018   MCV 87.3 10/07/2018   PLT 528 (H) 10/07/2018    BMET    Component Value Date/Time   NA 140 10/07/2018 0814   NA 138 03/29/2015 0918   K 3.4 (L) 10/07/2018 0814   K 4.5 03/29/2015 0918   CL 105 10/07/2018 0814   CL 104 03/29/2015 0918   CO2 26 10/07/2018 0814   CO2 26 03/29/2015 0918   GLUCOSE 71 10/07/2018 0814   GLUCOSE 187 (H) 03/29/2015 0918   BUN 11 10/15/2018 0913   BUN 10 03/29/2015 0918   CREATININE 0.74 10/15/2018 0913   CREATININE 0.80 03/29/2015 0918   CALCIUM 9.1 10/07/2018 0814   CALCIUM 9.1 03/29/2015 0918   GFRNONAA >60 10/15/2018 0913   GFRNONAA >60 03/29/2015 0918   GFRAA >60 10/15/2018 0913   GFRAA >60 03/29/2015 0918   CrCl cannot be calculated (Patient's most recent lab result is older than the maximum 21 days allowed.).  COAG No results found for: INR, PROTIME  Radiology Vas [place] Burnard Bunting With/wo Tbi  Result Date: 11/04/2018 LOWER EXTREMITY DOPPLER STUDY Indications: Claudication, and peripheral artery disease.   Comparison Study: 6-9/26/2019 Performing Technologist: Charlane Ferretti RT (R)(VS)  Examination Guidelines: A complete evaluation includes at minimum, Doppler waveform signals and systolic blood pressure reading at the level of bilateral brachial, anterior tibial, and posterior tibial arteries, when vessel segments are accessible. Bilateral testing is considered an integral part of a complete examination. Photoelectric Plethysmograph (PPG) waveforms and toe systolic pressure readings are included as required and additional duplex testing as needed. Limited examinations for reoccurring indications may be performed as noted.  ABI Findings: +---------+------------------+-----+---------+----------------+ Right    Rt Pressure (mmHg)IndexWaveform Comment          +---------+------------------+-----+---------+----------------+ Brachial 163                                              +---------+------------------+-----+---------+----------------+ ATA                             triphasicNon comprrssable +---------+------------------+-----+---------+----------------+ PTA                             triphasicNon compressable +---------+------------------+-----+---------+----------------+ Great Toe105               0.64                           +---------+------------------+-----+---------+----------------+ +---------+------------------+-----+---------+----------------+ Left     Lt Pressure (mmHg)IndexWaveform Comment          +---------+------------------+-----+---------+----------------+ Brachial 146                                              +---------+------------------+-----+---------+----------------+ ATA                             triphasicNon compressable +---------+------------------+-----+---------+----------------+ PTA                             triphasicNon compressable +---------+------------------+-----+---------+----------------+ Audrie Lia  0.73                           +---------+------------------+-----+---------+----------------+ +-------+-----------+-----------+------------+------------+ ABI/TBIToday's ABIToday's TBIPrevious ABIPrevious TBI +-------+-----------+-----------+------------+------------+ Right  [place]         .64        .95         Not detected +-------+-----------+-----------+------------+------------+ Left   [place]         .73        1.29        .81          +-------+-----------+-----------+------------+------------+ Bilateral ABIs appear increased compared to prior study on 09/19/2018. Left TBIs appear decreased compared to prior study on 09/19/2018. Left TBI is slightly decreased since previous exam on 09/19/2018  Summary: Right: Resting right ankle-brachial index indicates noncompressible right lower extremity arteries.The right toe-brachial index is abnormal. Right TBI is detected today and appears slightly abnormal. Left: Resting left ankle-brachial index indicates noncompressible left lower extremity arteries.The left toe-brachial index is normal.  *See table(s) above for measurements and observations.  Electronically signed by Hortencia Pilar MD on 11/04/2018 at 5:40:35 PM.   Final      Assessment/Plan 1. Atherosclerosis of native artery of both lower extremities with intermittent claudication (HCC)  Recommend:  The patient has evidence of atherosclerosis of the lower extremities with claudication.  The patient does not voice lifestyle limiting changes at this point in time.  Noninvasive studies do not suggest clinically significant change.  No invasive studies, angiography or surgery at this time The patient should continue walking and begin a more formal exercise program.  The patient should continue antiplatelet therapy and aggressive treatment of the lipid abnormalities  No changes in the patient's medications at this time  The patient should continue wearing graduated compression socks  10-15 mmHg strength to control the mild edema.    2. Pain of right lower extremity Improved see #1  3. Mixed hyperlipidemia Continue statin as ordered and reviewed, no changes at this time   4. Type 2 diabetes mellitus with other circulatory complication, with long-term current use of insulin (HCC) Continue hypoglycemic medications as already ordered, these medications have been reviewed and there are no changes at this time.  Hgb A1C to be monitored as already arranged by primary service     Hortencia Pilar, MD  11/09/2018 12:30 PM

## 2018-11-20 ENCOUNTER — Ambulatory Visit: Admit: 2018-11-20 | Discharge: 2018-11-20 | Payer: MEDICARE

## 2018-11-20 ENCOUNTER — Ambulatory Visit: Admit: 2018-11-20 | Discharge: 2018-11-20 | Payer: MEDICARE | Attending: Nephrology | Primary: Nephrology

## 2018-11-20 DIAGNOSIS — Z0000 Encounter for general adult medical examination without abnormal findings: Secondary | ICD-10-CM

## 2018-11-20 DIAGNOSIS — Z8639 Personal history of other endocrine, nutritional and metabolic disease: Secondary | ICD-10-CM

## 2018-11-20 DIAGNOSIS — E782 Mixed hyperlipidemia: Secondary | ICD-10-CM

## 2018-11-20 DIAGNOSIS — Z940 Kidney transplant status: Principal | ICD-10-CM

## 2018-11-20 DIAGNOSIS — E559 Vitamin D deficiency, unspecified: Secondary | ICD-10-CM

## 2018-11-20 DIAGNOSIS — Z48298 Encounter for aftercare following other organ transplant: Secondary | ICD-10-CM

## 2018-11-20 DIAGNOSIS — D899 Disorder involving the immune mechanism, unspecified: Secondary | ICD-10-CM

## 2018-11-20 DIAGNOSIS — Z114 Encounter for screening for human immunodeficiency virus [HIV]: Secondary | ICD-10-CM

## 2018-11-20 DIAGNOSIS — E119 Type 2 diabetes mellitus without complications: Secondary | ICD-10-CM

## 2018-12-20 ENCOUNTER — Encounter: Admit: 2018-12-20 | Discharge: 2018-12-20 | Payer: MEDICARE | Attending: Nephrology | Primary: Nephrology

## 2018-12-20 ENCOUNTER — Other Ambulatory Visit
Admission: RE | Admit: 2018-12-20 | Discharge: 2018-12-20 | Disposition: A | Discharge status: Home / Self Care | Payer: Medicare Other | Attending: Nephrology | Admitting: Nephrology

## 2018-12-20 DIAGNOSIS — T8610 Unspecified complication of kidney transplant: Secondary | ICD-10-CM | POA: Diagnosis not present

## 2018-12-20 DIAGNOSIS — Z9483 Pancreas transplant status: Secondary | ICD-10-CM | POA: Insufficient documentation

## 2018-12-20 DIAGNOSIS — Z114 Encounter for screening for human immunodeficiency virus [HIV]: Secondary | ICD-10-CM | POA: Insufficient documentation

## 2018-12-20 DIAGNOSIS — D899 Disorder involving the immune mechanism, unspecified: Secondary | ICD-10-CM | POA: Insufficient documentation

## 2018-12-20 DIAGNOSIS — E559 Vitamin D deficiency, unspecified: Secondary | ICD-10-CM | POA: Insufficient documentation

## 2018-12-20 DIAGNOSIS — D631 Anemia in chronic kidney disease: Secondary | ICD-10-CM | POA: Insufficient documentation

## 2018-12-20 DIAGNOSIS — Z940 Kidney transplant status: Secondary | ICD-10-CM | POA: Insufficient documentation

## 2018-12-20 DIAGNOSIS — Z09 Encounter for follow-up examination after completed treatment for conditions other than malignant neoplasm: Secondary | ICD-10-CM | POA: Insufficient documentation

## 2018-12-20 DIAGNOSIS — B259 Cytomegaloviral disease, unspecified: Secondary | ICD-10-CM | POA: Diagnosis not present

## 2018-12-20 DIAGNOSIS — E1129 Type 2 diabetes mellitus with other diabetic kidney complication: Secondary | ICD-10-CM | POA: Diagnosis not present

## 2018-12-20 DIAGNOSIS — Z79899 Other long term (current) drug therapy: Secondary | ICD-10-CM | POA: Diagnosis not present

## 2018-12-20 DIAGNOSIS — N390 Urinary tract infection, site not specified: Secondary | ICD-10-CM | POA: Insufficient documentation

## 2018-12-20 DIAGNOSIS — Z789 Other specified health status: Secondary | ICD-10-CM | POA: Diagnosis not present

## 2018-12-20 LAB — CBC WITH DIFFERENTIAL/PLATELET
Abs Immature Granulocytes: 0.06 10*3/uL (ref 0.00–0.07)
Basophils Absolute: 0 10*3/uL (ref 0.0–0.1)
Basophils Relative: 0 %
Eosinophils Absolute: 0.1 10*3/uL (ref 0.0–0.5)
Eosinophils Relative: 2 %
HCT: 42.4 % (ref 36.0–46.0)
Hemoglobin: 13.5 g/dL (ref 12.0–15.0)
Immature Granulocytes: 1 %
LYMPHS ABS: 2.1 10*3/uL (ref 0.7–4.0)
Lymphocytes Relative: 28 %
MCH: 28.7 pg (ref 26.0–34.0)
MCHC: 31.8 g/dL (ref 30.0–36.0)
MCV: 90 fL (ref 80.0–100.0)
MONOS PCT: 13 %
Monocytes Absolute: 1 10*3/uL (ref 0.1–1.0)
Neutro Abs: 4.1 10*3/uL (ref 1.7–7.7)
Neutrophils Relative %: 56 %
Platelets: 505 10*3/uL — ABNORMAL HIGH (ref 150–400)
RBC: 4.71 MIL/uL (ref 3.87–5.11)
RDW: 15.3 % (ref 11.5–15.5)
WBC: 7.3 10*3/uL (ref 4.0–10.5)
nRBC: 0 % (ref 0.0–0.2)

## 2018-12-20 LAB — BASIC METABOLIC PANEL
Anion gap: 6 (ref 5–15)
BUN: 10 mg/dL (ref 8–23)
CO2: 28 mmol/L (ref 22–32)
Calcium: 9.2 mg/dL (ref 8.9–10.3)
Chloride: 104 mmol/L (ref 98–111)
Creatinine, Ser: 0.84 mg/dL (ref 0.44–1.00)
GFR calc Af Amer: >60 mL/min (ref >60)
GFR calc non Af Amer: >60 mL/min (ref >60)
Glucose, Bld: 113 mg/dL — ABNORMAL HIGH (ref 70–99)
Potassium: 4.8 mmol/L (ref 3.5–5.1)
Sodium: 138 mmol/L (ref 135–145)

## 2018-12-20 LAB — PHOSPHORUS: Phosphorus: 3.8 mg/dL (ref 2.5–4.6)

## 2018-12-20 LAB — MAGNESIUM: Magnesium: 2 mg/dL (ref 1.7–2.4)

## 2018-12-23 DIAGNOSIS — Z940 Kidney transplant status: Principal | ICD-10-CM

## 2018-12-23 DIAGNOSIS — Z79899 Other long term (current) drug therapy: Secondary | ICD-10-CM

## 2019-01-20 ENCOUNTER — Encounter: Admit: 2019-01-20 | Discharge: 2019-01-20 | Payer: MEDICARE | Attending: Nephrology | Primary: Nephrology

## 2019-01-20 ENCOUNTER — Other Ambulatory Visit
Admission: RE | Admit: 2019-01-20 | Discharge: 2019-01-20 | Disposition: A | Discharge status: Home / Self Care | Payer: Medicare Other | Attending: Nephrology | Admitting: Nephrology

## 2019-01-20 DIAGNOSIS — B259 Cytomegaloviral disease, unspecified: Secondary | ICD-10-CM | POA: Insufficient documentation

## 2019-01-20 DIAGNOSIS — Z114 Encounter for screening for human immunodeficiency virus [HIV]: Secondary | ICD-10-CM | POA: Diagnosis not present

## 2019-01-20 DIAGNOSIS — Z789 Other specified health status: Secondary | ICD-10-CM | POA: Diagnosis not present

## 2019-01-20 DIAGNOSIS — D631 Anemia in chronic kidney disease: Secondary | ICD-10-CM | POA: Insufficient documentation

## 2019-01-20 DIAGNOSIS — Z09 Encounter for follow-up examination after completed treatment for conditions other than malignant neoplasm: Secondary | ICD-10-CM | POA: Diagnosis not present

## 2019-01-20 DIAGNOSIS — N390 Urinary tract infection, site not specified: Secondary | ICD-10-CM | POA: Insufficient documentation

## 2019-01-20 DIAGNOSIS — Z940 Kidney transplant status: Secondary | ICD-10-CM | POA: Insufficient documentation

## 2019-01-20 DIAGNOSIS — Z79899 Other long term (current) drug therapy: Secondary | ICD-10-CM | POA: Insufficient documentation

## 2019-01-20 DIAGNOSIS — E1129 Type 2 diabetes mellitus with other diabetic kidney complication: Secondary | ICD-10-CM | POA: Diagnosis not present

## 2019-01-20 DIAGNOSIS — D899 Disorder involving the immune mechanism, unspecified: Secondary | ICD-10-CM | POA: Diagnosis present

## 2019-01-20 DIAGNOSIS — Z9483 Pancreas transplant status: Secondary | ICD-10-CM | POA: Diagnosis not present

## 2019-01-20 LAB — BASIC METABOLIC PANEL
ANION GAP: 8 (ref 5–15)
BUN: 12 mg/dL (ref 8–23)
CO2: 27 mmol/L (ref 22–32)
Calcium: 9 mg/dL (ref 8.9–10.3)
Chloride: 103 mmol/L (ref 98–111)
Creatinine, Ser: 0.74 mg/dL (ref 0.44–1.00)
GFR calc Af Amer: >60 mL/min (ref >60)
GFR calc non Af Amer: >60 mL/min (ref >60)
Glucose, Bld: 105 mg/dL — ABNORMAL HIGH (ref 70–99)
Potassium: 3.8 mmol/L (ref 3.5–5.1)
Sodium: 138 mmol/L (ref 135–145)

## 2019-01-20 LAB — CBC WITH DIFFERENTIAL/PLATELET
Abs Immature Granulocytes: 0.09 10*3/uL — ABNORMAL HIGH (ref 0.00–0.07)
Basophils Absolute: 0 10*3/uL (ref 0.0–0.1)
Basophils Relative: 0 %
Eosinophils Absolute: 0.1 10*3/uL (ref 0.0–0.5)
Eosinophils Relative: 2 %
HCT: 44.1 % (ref 36.0–46.0)
Hemoglobin: 14.2 g/dL (ref 12.0–15.0)
IMMATURE GRANULOCYTES: 1 %
Lymphocytes Relative: 24 %
Lymphs Abs: 1.7 10*3/uL (ref 0.7–4.0)
MCH: 28.7 pg (ref 26.0–34.0)
MCHC: 32.2 g/dL (ref 30.0–36.0)
MCV: 89.1 fL (ref 80.0–100.0)
Monocytes Absolute: 0.9 10*3/uL (ref 0.1–1.0)
Monocytes Relative: 13 %
NEUTROS PCT: 60 %
Neutro Abs: 4.2 10*3/uL (ref 1.7–7.7)
Platelets: 545 10*3/uL — ABNORMAL HIGH (ref 150–400)
RBC: 4.95 MIL/uL (ref 3.87–5.11)
RDW: 14.7 % (ref 11.5–15.5)
WBC: 7.1 10*3/uL (ref 4.0–10.5)
nRBC: 0 % (ref 0.0–0.2)

## 2019-01-20 LAB — PHOSPHORUS: Phosphorus: 3.3 mg/dL (ref 2.5–4.6)

## 2019-01-20 LAB — MAGNESIUM: Magnesium: 2.1 mg/dL (ref 1.7–2.4)

## 2019-01-23 DIAGNOSIS — Z79899 Other long term (current) drug therapy: Secondary | ICD-10-CM

## 2019-01-23 DIAGNOSIS — Z940 Kidney transplant status: Principal | ICD-10-CM

## 2019-01-27 ENCOUNTER — Institutional Professional Consult (permissible substitution): Admit: 2019-01-27 | Discharge: 2019-01-28 | Payer: MEDICARE

## 2019-01-27 DIAGNOSIS — Z23 Encounter for immunization: Principal | ICD-10-CM

## 2019-02-06 ENCOUNTER — Ambulatory Visit (INDEPENDENT_AMBULATORY_CARE_PROVIDER_SITE_OTHER): Payer: Medicare Other | Admitting: Vascular Surgery

## 2019-02-06 ENCOUNTER — Other Ambulatory Visit (INDEPENDENT_AMBULATORY_CARE_PROVIDER_SITE_OTHER): Payer: Self-pay | Admitting: Vascular Surgery

## 2019-02-06 ENCOUNTER — Encounter (INDEPENDENT_AMBULATORY_CARE_PROVIDER_SITE_OTHER): Payer: Self-pay | Admitting: Vascular Surgery

## 2019-02-06 ENCOUNTER — Ambulatory Visit (INDEPENDENT_AMBULATORY_CARE_PROVIDER_SITE_OTHER): Payer: Medicare Other

## 2019-02-06 VITALS — BP 156/68 | HR 76 | Resp 18 | Ht 63.0 in | Wt 160.0 lb

## 2019-02-06 DIAGNOSIS — Z95820 Peripheral vascular angioplasty status with implants and grafts: Secondary | ICD-10-CM

## 2019-02-06 DIAGNOSIS — K219 Gastro-esophageal reflux disease without esophagitis: Secondary | ICD-10-CM | POA: Diagnosis not present

## 2019-02-06 DIAGNOSIS — I70211 Atherosclerosis of native arteries of extremities with intermittent claudication, right leg: Secondary | ICD-10-CM

## 2019-02-06 DIAGNOSIS — E1159 Type 2 diabetes mellitus with other circulatory complications: Secondary | ICD-10-CM

## 2019-02-06 DIAGNOSIS — E782 Mixed hyperlipidemia: Secondary | ICD-10-CM

## 2019-02-06 DIAGNOSIS — E1151 Type 2 diabetes mellitus with diabetic peripheral angiopathy without gangrene: Secondary | ICD-10-CM

## 2019-02-06 DIAGNOSIS — Z794 Long term (current) use of insulin: Secondary | ICD-10-CM

## 2019-02-06 DIAGNOSIS — Z87891 Personal history of nicotine dependence: Secondary | ICD-10-CM

## 2019-02-06 DIAGNOSIS — I70213 Atherosclerosis of native arteries of extremities with intermittent claudication, bilateral legs: Secondary | ICD-10-CM | POA: Diagnosis not present

## 2019-02-09 ENCOUNTER — Encounter (INDEPENDENT_AMBULATORY_CARE_PROVIDER_SITE_OTHER): Payer: Self-pay | Admitting: Vascular Surgery

## 2019-02-09 DIAGNOSIS — K219 Gastro-esophageal reflux disease without esophagitis: Secondary | ICD-10-CM | POA: Insufficient documentation

## 2019-02-09 NOTE — Progress Notes (Signed)
MRN : [id number]  Cindy Fischer is a 73 y.o. (1946-05-25) female who presents with chief complaint of  Chief Complaint  Patient presents with  . Follow-up  .  History of Present Illness:   The patient returns to the office for followup and review status post angiogram with intervention.   Procedure(s) Performed 10/16/2018: 1. Introduction catheter intorightlower extremity 3rd order catheter placement  2.Contrast injection rightlower extremity for distal runoff  3. Crosser atherectomy of therightSFA and popliteal arteries 4. Percutaneous transluminal angioplasty and stent placementrightsuperficial femoral artery and popliteal 5. Star close closureleftcommon femoral arteriotomy  The patient notes improvement in the lower extremity symptoms. No interval shortening of the patient's claudication distance or rest pain symptoms. Previous wounds have now healed.  No new ulcers or wounds have occurred since the last visit.  There have been no significant changes to the patient's overall health care.  The patient denies amaurosis fugax or recent TIA symptoms. There are no recent neurological changes noted. The patient denies history of DVT, PE or superficial thrombophlebitis. The patient denies recent episodes of angina or shortness of breath.   ABI's Rt=[place] and Lt=[place]  (triphasic signals) No change compared to last November  Duplex ultrasound of the right lower extremity  Shows the SFA and popliteal  Stent is widely patent  Current Meds  Medication Sig  . aspirin EC 81 MG tablet Take 81 mg by mouth daily.  Marland Kitchen atorvastatin (LIPITOR) 20 MG tablet Take 20 mg by mouth daily.  . Cholecalciferol (VITAMIN D) 2000 units tablet Take 2,000 Units by mouth daily.  . clopidogrel (PLAVIX) 75 MG tablet Take 1 tablet (75 mg total) by mouth daily.  . Everolimus 0.25 MG TABS Take 0.5 mg by mouth 2 (two) times daily.  .  insulin NPH Human (HUMULIN N,NOVOLIN N) 100 UNIT/ML injection Inject 30 Units into the skin daily.  . insulin regular (NOVOLIN R,HUMULIN R) 100 units/mL injection Inject 3-8 Units into the skin 3 (three) times daily before meals.  Marland Kitchen levothyroxine (SYNTHROID, LEVOTHROID) 100 MCG tablet Take 100 mcg by mouth daily before breakfast.  . lisinopril (PRINIVIL,ZESTRIL) 10 MG tablet Take 10 mg by mouth daily.  Marland Kitchen omeprazole (PRILOSEC) 40 MG capsule Take 40 mg by mouth every other day.   . predniSONE (DELTASONE) 5 MG tablet Take 5 mg by mouth daily.   . tacrolimus (PROGRAF) 0.5 MG capsule Take 1-1.5 mg by mouth See admin instructions. Take 1.5 mg in the morning and 1 mg in the evening    Past Medical History:  Diagnosis Date  . Cancer ([place]) 2016   skin (nose)  . Diabetes mellitus without complication ([place])   . Hypothyroidism   . Pneumonia   . Renal disorder     Past Surgical History:  Procedure Laterality Date  . CATARACT EXTRACTION, BILATERAL    . COLONOSCOPY  12/02/2010  . COLONOSCOPY WITH PROPOFOL N/A 09/04/2016   Procedure: COLONOSCOPY WITH PROPOFOL;  Surgeon: Christene Lye, MD;  Location: [facility] ENDOSCOPY;  Service: Endoscopy;  Laterality: N/A;  . EYE SURGERY    . INSERTION OF DIALYSIS CATHETER  2014   removed 2014 (only had in for about 8 weeks)  . laser surgery on eyes  1999  . LOWER EXTREMITY ANGIOGRAPHY Right 10/16/2018   Procedure: LOWER EXTREMITY ANGIOGRAPHY;  Surgeon: Katha Cabal, MD;  Location: [place] [facility];  Service: Cardiovascular;  Laterality: Right;  . 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 TONSILLECTOMY    . TUBAL  LIGATION      Social History Social History   Tobacco Use  . Smoking status: Former Smoker    Types: Cigarettes  . Smokeless tobacco: Never Used  Substance Use Topics  . Alcohol use: No  . Drug use: No    Family History Family History  Problem Relation Age of Onset  . Multiple myeloma Mother   . Diabetes Father   .  Heart attack Father   . Breast cancer Neg Hx     Allergies  Allergen Reactions  . Oxycodone-Acetaminophen Nausea And Vomiting  . Penicillins Rash    Has patient had a PCN reaction causing immediate rash, facial/tongue/throat swelling, SOB or lightheadedness with hypotension: Yes Has patient had a PCN reaction causing severe rash involving mucus membranes or skin necrosis: No Has patient had a PCN reaction that required hospitalization: No Has patient had a PCN reaction occurring within the last 10 years: No If all of the above answers are "NO", then may proceed with Cephalosporin use.      REVIEW OF SYSTEMS (Negative unless checked)  Constitutional: '[]' Weight loss  '[]' Fever  '[]' Chills Cardiac: '[]' Chest pain   '[]' Chest pressure   '[]' Palpitations   '[]' Shortness of breath when laying flat   '[]' Shortness of breath with exertion. Vascular:  '[x]' Pain in legs with walking   '[]' Pain in legs at rest  '[]' History of DVT   '[]' Phlebitis   '[]' Swelling in legs   '[]' Varicose veins   '[]' Non-healing ulcers Pulmonary:   '[]' Uses home oxygen   '[]' Productive cough   '[]' Hemoptysis   '[]' Wheeze  '[]' COPD   '[]' Asthma Neurologic:  '[]' Dizziness   '[]' Seizures   '[]' History of stroke   '[]' History of TIA  '[]' Aphasia   '[]' Vissual changes   '[]' Weakness or numbness in arm   '[]' Weakness or numbness in leg Musculoskeletal:   '[]' Joint swelling   '[]' Joint pain   '[]' Low back pain Hematologic:  '[]' Easy bruising  '[]' Easy bleeding   '[]' Hypercoagulable state   '[]' Anemic Gastrointestinal:  '[]' Diarrhea   '[]' Vomiting  '[]' Gastroesophageal reflux/heartburn   '[]' Difficulty swallowing. Genitourinary:  '[x]' Chronic kidney disease   '[]' Difficult urination  '[]' Frequent urination   '[]' Blood in urine Skin:  '[]' Rashes   '[]' Ulcers  Psychological:  '[]' History of anxiety   '[]'  History of major depression.  Physical Examination  Vitals:   02/06/19 1439  BP: (!) 156/68  Pulse: 76  Resp: 18  Weight: 160 lb (72.6 kg)  Height: '5\' 3"'  (1.6 m)   Body mass index is 28.34 kg/m. Gen: WD/WN,  NAD Head: [place]/AT, No temporalis wasting.  Ear/Nose/Throat: Hearing grossly intact, nares w/o erythema or drainage Eyes: PER, EOMI, sclera nonicteric.  Neck: Supple, no large masses.   Pulmonary:  Good air movement, no audible wheezing bilaterally, no use of accessory muscles.  Cardiac: RRR, no JVD Vascular: bilateral feet warm with good brisk cap refill Vessel Right Left  Radial Palpable Palpable  PT Palpable Palpable  DP Palpable Palpable  Gastrointestinal: Non-distended. No guarding/no peritoneal signs.  Musculoskeletal: M/S 5/5 throughout.  No deformity or atrophy.  Neurologic: CN 2-12 intact. Symmetrical.  Speech is fluent. Motor exam as listed above. Psychiatric: Judgment intact, Mood & affect appropriate for pt's clinical situation. Dermatologic: No rashes or ulcers noted.  No changes consistent with cellulitis. Lymph : No lichenification or skin changes of chronic lymphedema.  CBC Lab Results  Component Value Date   WBC 7.1 01/20/2019   HGB 14.2 01/20/2019   HCT 44.1 01/20/2019   MCV 89.1 01/20/2019   PLT 545 (H) 01/20/2019  BMET    Component Value Date/Time   NA 138 01/20/2019 0817   NA 138 03/29/2015 0918   K 3.8 01/20/2019 0817   K 4.5 03/29/2015 0918   CL 103 01/20/2019 0817   CL 104 03/29/2015 0918   CO2 27 01/20/2019 0817   CO2 26 03/29/2015 0918   GLUCOSE 105 (H) 01/20/2019 0817   GLUCOSE 187 (H) 03/29/2015 0918   BUN 12 01/20/2019 0817   BUN 10 03/29/2015 0918   CREATININE 0.74 01/20/2019 0817   CREATININE 0.80 03/29/2015 0918   CALCIUM 9.0 01/20/2019 0817   CALCIUM 9.1 03/29/2015 0918   GFRNONAA >60 01/20/2019 0817   GFRNONAA >60 03/29/2015 0918   GFRAA >60 01/20/2019 0817   GFRAA >60 03/29/2015 0918   Estimated Creatinine Clearance: 59.8 mL/min (by C-G formula based on SCr of 0.74 mg/dL).  COAG No results found for: INR, PROTIME  Radiology Vas [place] Abi With/wo Tbi  Result Date: 02/06/2019 LOWER EXTREMITY DOPPLER STUDY Indications:  Claudication.  Vascular Interventions: 10/16/2018: PTA and Stent of the Right SFA and Popliteal                         Artery. Comparison Study: 09/19/2018 Performing Technologist: Almira Coaster RVS  Examination Guidelines: A complete evaluation includes at minimum, Doppler waveform signals and systolic blood pressure reading at the level of bilateral brachial, anterior tibial, and posterior tibial arteries, when vessel segments are accessible. Bilateral testing is considered an integral part of a complete examination. Photoelectric Plethysmograph (PPG) waveforms and toe systolic pressure readings are included as required and additional duplex testing as needed. Limited examinations for reoccurring indications may be performed as noted.  ABI Findings: +---------+------------------+-----+---------+--------+ Right    Rt Pressure (mmHg)IndexWaveform Comment  +---------+------------------+-----+---------+--------+ Brachial 147                                      +---------+------------------+-----+---------+--------+ ATA      234               1.59 triphasicNC       +---------+------------------+-----+---------+--------+ PTA      176               1.20 triphasic         +---------+------------------+-----+---------+--------+ Great Toe89                0.61 Abnormal          +---------+------------------+-----+---------+--------+ +---------+------------------+-----+---------+-------+ Left     Lt Pressure (mmHg)IndexWaveform Comment +---------+------------------+-----+---------+-------+ Brachial 116                                     +---------+------------------+-----+---------+-------+ ATA      250               1.70 triphasicNC      +---------+------------------+-----+---------+-------+ PTA      250               1.70 triphasicNC      +---------+------------------+-----+---------+-------+ Great Toe92                0.63 Abnormal          +---------+------------------+-----+---------+-------+ +-------+-----------+-----------+------------+------------+ ABI/TBIToday's ABIToday's TBIPrevious ABIPrevious TBI +-------+-----------+-----------+------------+------------+ Right  >1.0 [place]    .61        .94                      +-------+-----------+-----------+------------+------------+  Left   >1.0 [place]    .63        >1.0 [place]                  +-------+-----------+-----------+------------+------------+ Compared to prior study on 09/19/2018.  Summary: Right: Resting right ankle-brachial index indicates noncompressible right lower extremity arteries.The right toe-brachial index is abnormal. Left: Resting left ankle-brachial index indicates noncompressible left lower extremity arteries.The left toe-brachial index is abnormal.  *See table(s) above for measurements and observations.  Electronically signed by Hortencia Pilar MD on 02/06/2019 at 5:56:18 PM.    Final    Vas [place] Lower Extremity Arterial Duplex  Result Date: 02/06/2019 LOWER EXTREMITY ARTERIAL DUPLEX STUDY Indications: Claudication, and peripheral artery disease.  Vascular Interventions: 10/16/2018:PTA and Stent placement in the Rt SFA and                         Popliteal Artery. Current ABI:            Rt 1.6,Lt >1.0 Comparison Study: 09/19/2018 Performing Technologist: Almira Coaster RVS  Examination Guidelines: A complete evaluation includes B-mode imaging, spectral Doppler, color Doppler, and power Doppler as needed of all accessible portions of each vessel. Bilateral testing is considered an integral part of a complete examination. Limited examinations for reoccurring indications may be performed as noted.  Right Duplex Findings: +-----------+--------+-----+--------+---------+--------+            PSV cm/sRatioStenosisWaveform Comments +-----------+--------+-----+--------+---------+--------+ CFA Distal 80                   triphasic          +-----------+--------+-----+--------+---------+--------+ DFA        58                   biphasic          +-----------+--------+-----+--------+---------+--------+ SFA Prox   77                   triphasic         +-----------+--------+-----+--------+---------+--------+ SFA Mid    107                  triphasic         +-----------+--------+-----+--------+---------+--------+ SFA Distal 83                   triphasic         +-----------+--------+-----+--------+---------+--------+ POP Distal 74                   triphasicstent    +-----------+--------+-----+--------+---------+--------+ ATA Distal 98                   triphasic         +-----------+--------+-----+--------+---------+--------+ PTA Distal 135                  triphasic         +-----------+--------+-----+--------+---------+--------+ PERO Distal38                   triphasic         +-----------+--------+-----+--------+---------+--------+  Right Stent(s): +--------------+---++---------+------------------+ Proximal Stent90 triphasicRt SFA P-M segment +--------------+---++---------+------------------+ Mid Stent     102triphasic                   +--------------+---++---------+------------------+ Distal Stent  102triphasicRt SFA M-D segment +--------------+---++---------+------------------+  Summary: See table(s) above for measurements and observations. Electronically signed by Hortencia Pilar MD on 02/06/2019 at 5:56:21  PM.    Final      Assessment/Plan 1. Atherosclerosis of native artery of both lower extremities with intermittent claudication (HCC)  Recommend:  The patient has evidence of atherosclerosis of the lower extremities with claudication.  The patient does not voice lifestyle limiting changes at this point in time.  Noninvasive studies do not suggest clinically significant change.  No invasive studies, angiography or surgery at this time The patient should  continue walking and begin a more formal exercise program.  The patient should continue antiplatelet therapy and aggressive treatment of the lipid abnormalities  No changes in the patient's medications at this time  The patient should continue wearing graduated compression socks 10-15 mmHg strength to control the mild edema.   - VAS [place] ABI WITH/WO TBI; Future - VAS [place] UPPER EXTREMITY ARTERIAL DUPLEX; Future  2. Mixed hyperlipidemia Continue statin as ordered and reviewed, no changes at this time   3. Type 2 diabetes mellitus with other circulatory complication, with long-term current use of insulin (HCC) Continue hypoglycemic medications as already ordered, these medications have been reviewed and there are no changes at this time.  Hgb A1C to be monitored as already arranged by primary service   4. Gastroesophageal reflux disease without esophagitis Continue PPI as already ordered, this medication has been reviewed and there are no changes at this time.  Avoidence of caffeine and alcohol  Moderate elevation of the head of the bed     Hortencia Pilar, MD  02/09/2019 4:08 PM

## 2019-02-12 MED ORDER — TACROLIMUS 0.5 MG CAPSULE
ORAL_CAPSULE | 5 refills | 0 days | Status: CP
Start: 2019-02-12 — End: 2019-04-09

## 2019-02-20 ENCOUNTER — Encounter: Admit: 2019-02-20 | Discharge: 2019-02-20 | Payer: MEDICARE | Attending: Nephrology | Primary: Nephrology

## 2019-02-20 ENCOUNTER — Other Ambulatory Visit
Admission: RE | Admit: 2019-02-20 | Discharge: 2019-02-20 | Disposition: A | Discharge status: Home / Self Care | Payer: Medicare Other | Attending: Nephrology | Admitting: Nephrology

## 2019-02-20 DIAGNOSIS — Z79899 Other long term (current) drug therapy: Secondary | ICD-10-CM | POA: Insufficient documentation

## 2019-02-20 DIAGNOSIS — Z114 Encounter for screening for human immunodeficiency virus [HIV]: Secondary | ICD-10-CM | POA: Insufficient documentation

## 2019-02-20 DIAGNOSIS — Z09 Encounter for follow-up examination after completed treatment for conditions other than malignant neoplasm: Secondary | ICD-10-CM | POA: Insufficient documentation

## 2019-02-20 DIAGNOSIS — Z940 Kidney transplant status: Secondary | ICD-10-CM | POA: Insufficient documentation

## 2019-02-20 DIAGNOSIS — Z9483 Pancreas transplant status: Secondary | ICD-10-CM | POA: Insufficient documentation

## 2019-02-20 DIAGNOSIS — E559 Vitamin D deficiency, unspecified: Secondary | ICD-10-CM | POA: Insufficient documentation

## 2019-02-20 DIAGNOSIS — D899 Disorder involving the immune mechanism, unspecified: Secondary | ICD-10-CM | POA: Insufficient documentation

## 2019-02-20 DIAGNOSIS — N390 Urinary tract infection, site not specified: Secondary | ICD-10-CM | POA: Insufficient documentation

## 2019-02-20 DIAGNOSIS — Z789 Other specified health status: Secondary | ICD-10-CM | POA: Insufficient documentation

## 2019-02-20 DIAGNOSIS — B259 Cytomegaloviral disease, unspecified: Secondary | ICD-10-CM | POA: Insufficient documentation

## 2019-02-20 DIAGNOSIS — E1129 Type 2 diabetes mellitus with other diabetic kidney complication: Secondary | ICD-10-CM | POA: Insufficient documentation

## 2019-02-20 DIAGNOSIS — D631 Anemia in chronic kidney disease: Secondary | ICD-10-CM | POA: Insufficient documentation

## 2019-02-20 LAB — BASIC METABOLIC PANEL
Anion gap: 8 (ref 5–15)
BUN: 12 mg/dL (ref 8–23)
CO2: 27 mmol/L (ref 22–32)
Calcium: 9 mg/dL (ref 8.9–10.3)
Chloride: 102 mmol/L (ref 98–111)
Creatinine, Ser: 0.72 mg/dL (ref 0.44–1.00)
GFR calc Af Amer: >60 mL/min (ref >60)
GFR calc non Af Amer: >60 mL/min (ref >60)
Glucose, Bld: 88 mg/dL (ref 70–99)
POTASSIUM: 4 mmol/L (ref 3.5–5.1)
Sodium: 137 mmol/L (ref 135–145)

## 2019-02-20 LAB — CBC WITH DIFFERENTIAL/PLATELET
Abs Immature Granulocytes: 0.04 10*3/uL (ref 0.00–0.07)
BASOS ABS: 0 10*3/uL (ref 0.0–0.1)
Basophils Relative: 0 %
Eosinophils Absolute: 0.2 10*3/uL (ref 0.0–0.5)
Eosinophils Relative: 2 %
HCT: 44.7 % (ref 36.0–46.0)
Hemoglobin: 14.6 g/dL (ref 12.0–15.0)
Immature Granulocytes: 1 %
Lymphocytes Relative: 26 %
Lymphs Abs: 1.8 10*3/uL (ref 0.7–4.0)
MCH: 28.8 pg (ref 26.0–34.0)
MCHC: 32.7 g/dL (ref 30.0–36.0)
MCV: 88.2 fL (ref 80.0–100.0)
Monocytes Absolute: 1.2 10*3/uL — ABNORMAL HIGH (ref 0.1–1.0)
Monocytes Relative: 18 %
NEUTROS ABS: 3.6 10*3/uL (ref 1.7–7.7)
Neutrophils Relative %: 53 %
Platelets: 499 10*3/uL — ABNORMAL HIGH (ref 150–400)
RBC: 5.07 MIL/uL (ref 3.87–5.11)
RDW: 14.6 % (ref 11.5–15.5)
WBC: 6.8 10*3/uL (ref 4.0–10.5)
nRBC: 0 % (ref 0.0–0.2)

## 2019-02-20 LAB — MAGNESIUM: Magnesium: 2 mg/dL (ref 1.7–2.4)

## 2019-02-20 LAB — PHOSPHORUS: Phosphorus: 3.9 mg/dL (ref 2.5–4.6)

## 2019-02-24 DIAGNOSIS — Z940 Kidney transplant status: Principal | ICD-10-CM

## 2019-02-24 DIAGNOSIS — Z79899 Other long term (current) drug therapy: Principal | ICD-10-CM

## 2019-03-03 MED ORDER — PREDNISONE 5 MG TABLET
ORAL_TABLET | Freq: Every day | ORAL | 5 refills | 0 days | Status: CP
Start: 2019-03-03 — End: 2019-06-01

## 2019-03-21 ENCOUNTER — Encounter: Admit: 2019-03-21 | Discharge: 2019-03-21 | Payer: MEDICARE | Attending: Nephrology | Primary: Nephrology

## 2019-03-21 ENCOUNTER — Other Ambulatory Visit
Admission: RE | Admit: 2019-03-21 | Discharge: 2019-03-21 | Disposition: A | Discharge status: Home / Self Care | Payer: Medicare Other | Attending: Nephrology | Admitting: Nephrology

## 2019-03-21 DIAGNOSIS — Z9483 Pancreas transplant status: Secondary | ICD-10-CM | POA: Insufficient documentation

## 2019-03-21 DIAGNOSIS — Z114 Encounter for screening for human immunodeficiency virus [HIV]: Secondary | ICD-10-CM | POA: Insufficient documentation

## 2019-03-21 DIAGNOSIS — E559 Vitamin D deficiency, unspecified: Secondary | ICD-10-CM | POA: Diagnosis not present

## 2019-03-21 DIAGNOSIS — E1129 Type 2 diabetes mellitus with other diabetic kidney complication: Secondary | ICD-10-CM | POA: Insufficient documentation

## 2019-03-21 DIAGNOSIS — Z940 Kidney transplant status: Secondary | ICD-10-CM | POA: Insufficient documentation

## 2019-03-21 DIAGNOSIS — T8610 Unspecified complication of kidney transplant: Secondary | ICD-10-CM | POA: Diagnosis not present

## 2019-03-21 DIAGNOSIS — D631 Anemia in chronic kidney disease: Secondary | ICD-10-CM | POA: Insufficient documentation

## 2019-03-21 DIAGNOSIS — Z09 Encounter for follow-up examination after completed treatment for conditions other than malignant neoplasm: Secondary | ICD-10-CM | POA: Diagnosis not present

## 2019-03-21 DIAGNOSIS — B259 Cytomegaloviral disease, unspecified: Secondary | ICD-10-CM | POA: Insufficient documentation

## 2019-03-21 DIAGNOSIS — N390 Urinary tract infection, site not specified: Secondary | ICD-10-CM | POA: Insufficient documentation

## 2019-03-21 DIAGNOSIS — N189 Chronic kidney disease, unspecified: Secondary | ICD-10-CM | POA: Insufficient documentation

## 2019-03-21 DIAGNOSIS — Z79899 Other long term (current) drug therapy: Secondary | ICD-10-CM | POA: Diagnosis not present

## 2019-03-21 DIAGNOSIS — D899 Disorder involving the immune mechanism, unspecified: Secondary | ICD-10-CM | POA: Diagnosis present

## 2019-03-21 DIAGNOSIS — Z789 Other specified health status: Secondary | ICD-10-CM | POA: Diagnosis not present

## 2019-03-21 LAB — BASIC METABOLIC PANEL
Anion gap: 6 (ref 5–15)
BUN: 18 mg/dL (ref 8–23)
CHLORIDE: 103 mmol/L (ref 98–111)
CO2: 28 mmol/L (ref 22–32)
Calcium: 9.1 mg/dL (ref 8.9–10.3)
Creatinine, Ser: 0.85 mg/dL (ref 0.44–1.00)
GFR calc Af Amer: >60 mL/min (ref >60)
GFR calc non Af Amer: >60 mL/min (ref >60)
Glucose, Bld: 99 mg/dL (ref 70–99)
Potassium: 4.7 mmol/L (ref 3.5–5.1)
Sodium: 137 mmol/L (ref 135–145)

## 2019-03-21 LAB — CBC WITH DIFFERENTIAL/PLATELET
ABS IMMATURE GRANULOCYTES: 0.05 10*3/uL (ref 0.00–0.07)
Basophils Absolute: 0 10*3/uL (ref 0.0–0.1)
Basophils Relative: 0 %
Eosinophils Absolute: 0.1 10*3/uL (ref 0.0–0.5)
Eosinophils Relative: 2 %
HCT: 43 % (ref 36.0–46.0)
Hemoglobin: 14.1 g/dL (ref 12.0–15.0)
Immature Granulocytes: 1 %
Lymphocytes Relative: 30 %
Lymphs Abs: 2.1 10*3/uL (ref 0.7–4.0)
MCH: 28.7 pg (ref 26.0–34.0)
MCHC: 32.8 g/dL (ref 30.0–36.0)
MCV: 87.4 fL (ref 80.0–100.0)
Monocytes Absolute: 0.9 10*3/uL (ref 0.1–1.0)
Monocytes Relative: 13 %
NEUTROS ABS: 3.7 10*3/uL (ref 1.7–7.7)
NRBC: 0 % (ref 0.0–0.2)
Neutrophils Relative %: 54 %
Platelets: 506 10*3/uL — ABNORMAL HIGH (ref 150–400)
RBC: 4.92 MIL/uL (ref 3.87–5.11)
RDW: 14.7 % (ref 11.5–15.5)
WBC: 6.8 10*3/uL (ref 4.0–10.5)

## 2019-03-21 LAB — MAGNESIUM: Magnesium: 2 mg/dL (ref 1.7–2.4)

## 2019-03-21 LAB — PHOSPHORUS: Phosphorus: 4.2 mg/dL (ref 2.5–4.6)

## 2019-03-25 DIAGNOSIS — Z940 Kidney transplant status: Principal | ICD-10-CM

## 2019-03-25 DIAGNOSIS — Z79899 Other long term (current) drug therapy: Principal | ICD-10-CM

## 2019-04-08 ENCOUNTER — Other Ambulatory Visit (INDEPENDENT_AMBULATORY_CARE_PROVIDER_SITE_OTHER): Payer: Self-pay | Admitting: Vascular Surgery

## 2019-04-09 MED ORDER — OMEPRAZOLE 40 MG CAPSULE,DELAYED RELEASE
ORAL_CAPSULE | ORAL | 4 refills | 0.00000 days
Start: 2019-04-09 — End: 2020-04-08

## 2019-04-10 ENCOUNTER — Telehealth (INDEPENDENT_AMBULATORY_CARE_PROVIDER_SITE_OTHER): Payer: Self-pay

## 2019-04-10 NOTE — Telephone Encounter (Signed)
Patient called asking if she should continue taking Clopidogrel and I spoke with Dr Delana Meyer and he inform that the patient can stop the medication but the patient should continue taking aspirin

## 2019-04-23 ENCOUNTER — Encounter: Admit: 2019-04-23 | Discharge: 2019-04-23 | Payer: MEDICARE | Attending: Nephrology | Primary: Nephrology

## 2019-04-23 ENCOUNTER — Other Ambulatory Visit
Admission: RE | Admit: 2019-04-23 | Discharge: 2019-04-23 | Disposition: A | Discharge status: Home / Self Care | Payer: Medicare Other | Attending: Nephrology | Admitting: Nephrology

## 2019-04-23 ENCOUNTER — Other Ambulatory Visit: Payer: Self-pay

## 2019-04-23 DIAGNOSIS — D899 Disorder involving the immune mechanism, unspecified: Secondary | ICD-10-CM | POA: Diagnosis present

## 2019-04-23 DIAGNOSIS — B259 Cytomegaloviral disease, unspecified: Secondary | ICD-10-CM | POA: Insufficient documentation

## 2019-04-23 DIAGNOSIS — Z79899 Other long term (current) drug therapy: Secondary | ICD-10-CM | POA: Insufficient documentation

## 2019-04-23 DIAGNOSIS — Z114 Encounter for screening for human immunodeficiency virus [HIV]: Secondary | ICD-10-CM | POA: Insufficient documentation

## 2019-04-23 DIAGNOSIS — E559 Vitamin D deficiency, unspecified: Secondary | ICD-10-CM | POA: Insufficient documentation

## 2019-04-23 DIAGNOSIS — Z09 Encounter for follow-up examination after completed treatment for conditions other than malignant neoplasm: Secondary | ICD-10-CM | POA: Insufficient documentation

## 2019-04-23 DIAGNOSIS — Z940 Kidney transplant status: Secondary | ICD-10-CM | POA: Diagnosis not present

## 2019-04-23 DIAGNOSIS — Z9483 Pancreas transplant status: Secondary | ICD-10-CM | POA: Diagnosis not present

## 2019-04-23 DIAGNOSIS — E1129 Type 2 diabetes mellitus with other diabetic kidney complication: Secondary | ICD-10-CM | POA: Insufficient documentation

## 2019-04-23 DIAGNOSIS — D631 Anemia in chronic kidney disease: Secondary | ICD-10-CM | POA: Diagnosis not present

## 2019-04-23 DIAGNOSIS — Z789 Other specified health status: Secondary | ICD-10-CM | POA: Insufficient documentation

## 2019-04-23 DIAGNOSIS — N390 Urinary tract infection, site not specified: Secondary | ICD-10-CM | POA: Diagnosis not present

## 2019-04-23 LAB — CBC WITH DIFFERENTIAL/PLATELET
Abs Immature Granulocytes: 0.06 10*3/uL (ref 0.00–0.07)
Basophils Absolute: 0 10*3/uL (ref 0.0–0.1)
Basophils Relative: 0 %
Eosinophils Absolute: 0.1 10*3/uL (ref 0.0–0.5)
Eosinophils Relative: 2 %
HCT: 43.1 % (ref 36.0–46.0)
Hemoglobin: 13.8 g/dL (ref 12.0–15.0)
Immature Granulocytes: 1 %
Lymphocytes Relative: 27 %
Lymphs Abs: 2 10*3/uL (ref 0.7–4.0)
MCH: 28.5 pg (ref 26.0–34.0)
MCHC: 32 g/dL (ref 30.0–36.0)
MCV: 88.9 fL (ref 80.0–100.0)
Monocytes Absolute: 1 10*3/uL (ref 0.1–1.0)
Monocytes Relative: 13 %
Neutro Abs: 4.3 10*3/uL (ref 1.7–7.7)
Neutrophils Relative %: 57 %
Platelets: 474 10*3/uL — ABNORMAL HIGH (ref 150–400)
RBC: 4.85 MIL/uL (ref 3.87–5.11)
RDW: 15.4 % (ref 11.5–15.5)
WBC: 7.4 10*3/uL (ref 4.0–10.5)
nRBC: 0 % (ref 0.0–0.2)

## 2019-04-23 LAB — BASIC METABOLIC PANEL
Anion gap: 5 (ref 5–15)
BUN: 18 mg/dL (ref 8–23)
CO2: 28 mmol/L (ref 22–32)
Calcium: 9.3 mg/dL (ref 8.9–10.3)
Chloride: 104 mmol/L (ref 98–111)
Creatinine, Ser: 0.71 mg/dL (ref 0.44–1.00)
GFR calc Af Amer: >60 mL/min (ref >60)
GFR calc non Af Amer: >60 mL/min (ref >60)
Glucose, Bld: 95 mg/dL (ref 70–99)
Potassium: 3.7 mmol/L (ref 3.5–5.1)
Sodium: 137 mmol/L (ref 135–145)

## 2019-04-23 LAB — MAGNESIUM: Magnesium: 2 mg/dL (ref 1.7–2.4)

## 2019-04-23 LAB — PHOSPHORUS: Phosphorus: 4 mg/dL (ref 2.5–4.6)

## 2019-04-25 MED ORDER — ATORVASTATIN 20 MG TABLET
ORAL_TABLET | Freq: Every day | ORAL | 11 refills | 0 days | Status: CP
Start: 2019-04-25 — End: 2020-04-24

## 2019-04-28 DIAGNOSIS — Z940 Kidney transplant status: Principal | ICD-10-CM

## 2019-05-01 ENCOUNTER — Telehealth: Admit: 2019-05-01 | Discharge: 2019-05-02 | Payer: MEDICARE

## 2019-05-01 DIAGNOSIS — E1169 Type 2 diabetes mellitus with other specified complication: Secondary | ICD-10-CM

## 2019-05-01 DIAGNOSIS — D899 Disorder involving the immune mechanism, unspecified: Secondary | ICD-10-CM

## 2019-05-01 DIAGNOSIS — Z48298 Encounter for aftercare following other organ transplant: Secondary | ICD-10-CM

## 2019-05-01 DIAGNOSIS — E669 Obesity, unspecified: Secondary | ICD-10-CM

## 2019-05-01 DIAGNOSIS — I10 Essential (primary) hypertension: Secondary | ICD-10-CM

## 2019-05-01 DIAGNOSIS — Z940 Kidney transplant status: Principal | ICD-10-CM

## 2019-05-01 MED ORDER — LISINOPRIL 10 MG TABLET
ORAL_TABLET | Freq: Every day | ORAL | 3 refills | 0.00000 days | Status: CP
Start: 2019-05-01 — End: ?

## 2019-05-21 ENCOUNTER — Encounter: Admit: 2019-05-21 | Discharge: 2019-05-21 | Payer: MEDICARE | Attending: Nephrology | Primary: Nephrology

## 2019-05-21 ENCOUNTER — Other Ambulatory Visit: Payer: Self-pay

## 2019-05-21 ENCOUNTER — Other Ambulatory Visit
Admission: RE | Admit: 2019-05-21 | Discharge: 2019-05-21 | Disposition: A | Discharge status: Home / Self Care | Payer: Medicare Other | Attending: Nephrology | Admitting: Nephrology

## 2019-05-21 DIAGNOSIS — Z79899 Other long term (current) drug therapy: Secondary | ICD-10-CM | POA: Insufficient documentation

## 2019-05-21 DIAGNOSIS — E559 Vitamin D deficiency, unspecified: Secondary | ICD-10-CM | POA: Insufficient documentation

## 2019-05-21 DIAGNOSIS — Z09 Encounter for follow-up examination after completed treatment for conditions other than malignant neoplasm: Secondary | ICD-10-CM | POA: Diagnosis not present

## 2019-05-21 DIAGNOSIS — T8610 Unspecified complication of kidney transplant: Secondary | ICD-10-CM | POA: Diagnosis not present

## 2019-05-21 DIAGNOSIS — D631 Anemia in chronic kidney disease: Secondary | ICD-10-CM | POA: Insufficient documentation

## 2019-05-21 DIAGNOSIS — E1129 Type 2 diabetes mellitus with other diabetic kidney complication: Secondary | ICD-10-CM | POA: Insufficient documentation

## 2019-05-21 DIAGNOSIS — Z114 Encounter for screening for human immunodeficiency virus [HIV]: Secondary | ICD-10-CM | POA: Diagnosis not present

## 2019-05-21 DIAGNOSIS — Z9483 Pancreas transplant status: Secondary | ICD-10-CM | POA: Diagnosis not present

## 2019-05-21 DIAGNOSIS — D899 Disorder involving the immune mechanism, unspecified: Secondary | ICD-10-CM | POA: Insufficient documentation

## 2019-05-21 DIAGNOSIS — N390 Urinary tract infection, site not specified: Secondary | ICD-10-CM | POA: Insufficient documentation

## 2019-05-21 DIAGNOSIS — Z789 Other specified health status: Secondary | ICD-10-CM | POA: Insufficient documentation

## 2019-05-21 DIAGNOSIS — B259 Cytomegaloviral disease, unspecified: Secondary | ICD-10-CM | POA: Insufficient documentation

## 2019-05-21 DIAGNOSIS — Z940 Kidney transplant status: Secondary | ICD-10-CM | POA: Insufficient documentation

## 2019-05-21 DIAGNOSIS — N189 Chronic kidney disease, unspecified: Secondary | ICD-10-CM | POA: Diagnosis present

## 2019-05-21 LAB — CBC WITH DIFFERENTIAL/PLATELET
Abs Immature Granulocytes: 0.06 10*3/uL (ref 0.00–0.07)
Basophils Absolute: 0 10*3/uL (ref 0.0–0.1)
Basophils Relative: 0 %
Eosinophils Absolute: 0.2 10*3/uL (ref 0.0–0.5)
Eosinophils Relative: 2 %
HCT: 42.9 % (ref 36.0–46.0)
Hemoglobin: 14 g/dL (ref 12.0–15.0)
Immature Granulocytes: 1 %
Lymphocytes Relative: 29 %
Lymphs Abs: 2.2 10*3/uL (ref 0.7–4.0)
MCH: 29.2 pg (ref 26.0–34.0)
MCHC: 32.6 g/dL (ref 30.0–36.0)
MCV: 89.4 fL (ref 80.0–100.0)
Monocytes Absolute: 1 10*3/uL (ref 0.1–1.0)
Monocytes Relative: 13 %
Neutro Abs: 4.2 10*3/uL (ref 1.7–7.7)
Neutrophils Relative %: 55 %
Platelets: 487 10*3/uL — ABNORMAL HIGH (ref 150–400)
RBC: 4.8 MIL/uL (ref 3.87–5.11)
RDW: 15.5 % (ref 11.5–15.5)
WBC: 7.7 10*3/uL (ref 4.0–10.5)
nRBC: 0 % (ref 0.0–0.2)

## 2019-05-21 LAB — BASIC METABOLIC PANEL
Anion gap: 8 (ref 5–15)
BUN: 13 mg/dL (ref 8–23)
CO2: 27 mmol/L (ref 22–32)
Calcium: 9 mg/dL (ref 8.9–10.3)
Chloride: 105 mmol/L (ref 98–111)
Creatinine, Ser: 0.72 mg/dL (ref 0.44–1.00)
GFR calc Af Amer: >60 mL/min (ref >60)
GFR calc non Af Amer: >60 mL/min (ref >60)
Glucose, Bld: 84 mg/dL (ref 70–99)
Potassium: 4 mmol/L (ref 3.5–5.1)
Sodium: 140 mmol/L (ref 135–145)

## 2019-05-21 LAB — PHOSPHORUS: Phosphorus: 4.4 mg/dL (ref 2.5–4.6)

## 2019-05-21 LAB — MAGNESIUM: Magnesium: 2 mg/dL (ref 1.7–2.4)

## 2019-05-26 DIAGNOSIS — Z940 Kidney transplant status: Principal | ICD-10-CM

## 2019-06-01 MED ORDER — PREDNISONE 5 MG TABLET
ORAL_TABLET | Freq: Every day | ORAL | 3 refills | 0 days | Status: CP
Start: 2019-06-01 — End: 2020-05-31

## 2019-06-20 ENCOUNTER — Encounter: Admit: 2019-06-20 | Discharge: 2019-06-20 | Payer: MEDICARE | Attending: Nephrology | Primary: Nephrology

## 2019-06-20 ENCOUNTER — Other Ambulatory Visit: Payer: Self-pay

## 2019-06-20 ENCOUNTER — Other Ambulatory Visit
Admission: RE | Admit: 2019-06-20 | Discharge: 2019-06-20 | Disposition: A | Discharge status: Home / Self Care | Payer: Medicare Other | Attending: Nephrology | Admitting: Nephrology

## 2019-06-20 DIAGNOSIS — D899 Disorder involving the immune mechanism, unspecified: Secondary | ICD-10-CM | POA: Diagnosis present

## 2019-06-20 DIAGNOSIS — Z114 Encounter for screening for human immunodeficiency virus [HIV]: Secondary | ICD-10-CM | POA: Diagnosis not present

## 2019-06-20 DIAGNOSIS — E1129 Type 2 diabetes mellitus with other diabetic kidney complication: Secondary | ICD-10-CM | POA: Insufficient documentation

## 2019-06-20 DIAGNOSIS — E559 Vitamin D deficiency, unspecified: Secondary | ICD-10-CM | POA: Diagnosis not present

## 2019-06-20 DIAGNOSIS — Z79899 Other long term (current) drug therapy: Secondary | ICD-10-CM | POA: Insufficient documentation

## 2019-06-20 DIAGNOSIS — Z789 Other specified health status: Secondary | ICD-10-CM | POA: Insufficient documentation

## 2019-06-20 DIAGNOSIS — Z09 Encounter for follow-up examination after completed treatment for conditions other than malignant neoplasm: Secondary | ICD-10-CM | POA: Insufficient documentation

## 2019-06-20 DIAGNOSIS — D631 Anemia in chronic kidney disease: Secondary | ICD-10-CM | POA: Insufficient documentation

## 2019-06-20 DIAGNOSIS — Z9483 Pancreas transplant status: Secondary | ICD-10-CM | POA: Diagnosis not present

## 2019-06-20 DIAGNOSIS — N390 Urinary tract infection, site not specified: Secondary | ICD-10-CM | POA: Insufficient documentation

## 2019-06-20 DIAGNOSIS — B259 Cytomegaloviral disease, unspecified: Secondary | ICD-10-CM | POA: Insufficient documentation

## 2019-06-20 LAB — CBC WITH DIFFERENTIAL/PLATELET
Abs Immature Granulocytes: 0.04 10*3/uL (ref 0.00–0.07)
Basophils Absolute: 0 10*3/uL (ref 0.0–0.1)
Basophils Relative: 0 %
Eosinophils Absolute: 0.1 10*3/uL (ref 0.0–0.5)
Eosinophils Relative: 1 %
HCT: 44.2 % (ref 36.0–46.0)
Hemoglobin: 15 g/dL (ref 12.0–15.0)
Immature Granulocytes: 1 %
Lymphocytes Relative: 26 %
Lymphs Abs: 2 10*3/uL (ref 0.7–4.0)
MCH: 29.2 pg (ref 26.0–34.0)
MCHC: 33.9 g/dL (ref 30.0–36.0)
MCV: 86.2 fL (ref 80.0–100.0)
Monocytes Absolute: 1 10*3/uL (ref 0.1–1.0)
Monocytes Relative: 12 %
Neutro Abs: 4.8 10*3/uL (ref 1.7–7.7)
Neutrophils Relative %: 60 %
Platelets: 540 10*3/uL — ABNORMAL HIGH (ref 150–400)
RBC: 5.13 MIL/uL — ABNORMAL HIGH (ref 3.87–5.11)
RDW: 14.2 % (ref 11.5–15.5)
WBC: 7.9 10*3/uL (ref 4.0–10.5)
nRBC: 0 % (ref 0.0–0.2)

## 2019-06-20 LAB — BASIC METABOLIC PANEL
Anion gap: 9 (ref 5–15)
BUN: 22 mg/dL (ref 8–23)
CO2: 25 mmol/L (ref 22–32)
Calcium: 9.4 mg/dL (ref 8.9–10.3)
Chloride: 100 mmol/L (ref 98–111)
Creatinine, Ser: 0.85 mg/dL (ref 0.44–1.00)
GFR calc Af Amer: >60 mL/min (ref >60)
GFR calc non Af Amer: >60 mL/min (ref >60)
Glucose, Bld: 87 mg/dL (ref 70–99)
Potassium: 4 mmol/L (ref 3.5–5.1)
Sodium: 134 mmol/L — ABNORMAL LOW (ref 135–145)

## 2019-06-20 LAB — PHOSPHORUS: Phosphorus: 4.1 mg/dL (ref 2.5–4.6)

## 2019-06-20 LAB — MAGNESIUM: Magnesium: 2.1 mg/dL (ref 1.7–2.4)

## 2019-06-30 DIAGNOSIS — Z940 Kidney transplant status: Principal | ICD-10-CM

## 2019-07-04 ENCOUNTER — Ambulatory Visit: Admit: 2019-07-04 | Discharge: 2019-07-05 | Payer: MEDICARE

## 2019-07-04 DIAGNOSIS — D229 Melanocytic nevi, unspecified: Secondary | ICD-10-CM

## 2019-07-04 DIAGNOSIS — L821 Other seborrheic keratosis: Secondary | ICD-10-CM

## 2019-07-04 DIAGNOSIS — D489 Neoplasm of uncertain behavior, unspecified: Principal | ICD-10-CM

## 2019-07-04 DIAGNOSIS — L814 Other melanin hyperpigmentation: Secondary | ICD-10-CM

## 2019-07-16 ENCOUNTER — Ambulatory Visit: Admit: 2019-07-16 | Discharge: 2019-07-17 | Payer: MEDICARE

## 2019-07-16 DIAGNOSIS — D0439 Carcinoma in situ of skin of other parts of face: Principal | ICD-10-CM

## 2019-07-16 DIAGNOSIS — L578 Other skin changes due to chronic exposure to nonionizing radiation: Secondary | ICD-10-CM

## 2019-07-16 DIAGNOSIS — D04111 Carcinoma in situ of skin of right upper eyelid, including canthus: Secondary | ICD-10-CM

## 2019-07-16 DIAGNOSIS — Z85828 Personal history of other malignant neoplasm of skin: Secondary | ICD-10-CM

## 2019-07-16 DIAGNOSIS — L814 Other melanin hyperpigmentation: Secondary | ICD-10-CM

## 2019-07-21 ENCOUNTER — Encounter: Admit: 2019-07-21 | Discharge: 2019-07-21 | Payer: MEDICARE | Attending: Nephrology | Primary: Nephrology

## 2019-07-21 ENCOUNTER — Other Ambulatory Visit
Admission: RE | Admit: 2019-07-21 | Discharge: 2019-07-21 | Disposition: A | Discharge status: Home / Self Care | Payer: Medicare Other | Attending: Nephrology | Admitting: Nephrology

## 2019-07-21 ENCOUNTER — Other Ambulatory Visit: Payer: Self-pay

## 2019-07-21 DIAGNOSIS — Z789 Other specified health status: Secondary | ICD-10-CM | POA: Diagnosis not present

## 2019-07-21 DIAGNOSIS — Z79899 Other long term (current) drug therapy: Secondary | ICD-10-CM | POA: Insufficient documentation

## 2019-07-21 DIAGNOSIS — Z9483 Pancreas transplant status: Secondary | ICD-10-CM | POA: Diagnosis not present

## 2019-07-21 DIAGNOSIS — D899 Disorder involving the immune mechanism, unspecified: Secondary | ICD-10-CM | POA: Diagnosis present

## 2019-07-21 DIAGNOSIS — E559 Vitamin D deficiency, unspecified: Secondary | ICD-10-CM | POA: Diagnosis not present

## 2019-07-21 DIAGNOSIS — E1129 Type 2 diabetes mellitus with other diabetic kidney complication: Secondary | ICD-10-CM | POA: Diagnosis not present

## 2019-07-21 DIAGNOSIS — N189 Chronic kidney disease, unspecified: Secondary | ICD-10-CM | POA: Insufficient documentation

## 2019-07-21 DIAGNOSIS — Z940 Kidney transplant status: Secondary | ICD-10-CM | POA: Diagnosis not present

## 2019-07-21 DIAGNOSIS — B259 Cytomegaloviral disease, unspecified: Secondary | ICD-10-CM | POA: Diagnosis not present

## 2019-07-21 DIAGNOSIS — N390 Urinary tract infection, site not specified: Secondary | ICD-10-CM | POA: Diagnosis not present

## 2019-07-21 DIAGNOSIS — D631 Anemia in chronic kidney disease: Secondary | ICD-10-CM | POA: Diagnosis not present

## 2019-07-21 DIAGNOSIS — Z114 Encounter for screening for human immunodeficiency virus [HIV]: Secondary | ICD-10-CM | POA: Diagnosis not present

## 2019-07-21 LAB — BASIC METABOLIC PANEL
Anion gap: 6 (ref 5–15)
BUN: 11 mg/dL (ref 8–23)
CO2: 28 mmol/L (ref 22–32)
Calcium: 9.5 mg/dL (ref 8.9–10.3)
Chloride: 101 mmol/L (ref 98–111)
Creatinine, Ser: 0.7 mg/dL (ref 0.44–1.00)
GFR calc Af Amer: >60 mL/min (ref >60)
GFR calc non Af Amer: >60 mL/min (ref >60)
Glucose, Bld: 97 mg/dL (ref 70–99)
Potassium: 5.1 mmol/L (ref 3.5–5.1)
Sodium: 135 mmol/L (ref 135–145)

## 2019-07-21 LAB — CBC WITH DIFFERENTIAL/PLATELET
Abs Immature Granulocytes: 0.03 10*3/uL (ref 0.00–0.07)
Basophils Absolute: 0 10*3/uL (ref 0.0–0.1)
Basophils Relative: 0 %
Eosinophils Absolute: 0.1 10*3/uL (ref 0.0–0.5)
Eosinophils Relative: 2 %
HCT: 43.2 % (ref 36.0–46.0)
Hemoglobin: 14.5 g/dL (ref 12.0–15.0)
Immature Granulocytes: 0 %
Lymphocytes Relative: 29 %
Lymphs Abs: 2.1 10*3/uL (ref 0.7–4.0)
MCH: 29.3 pg (ref 26.0–34.0)
MCHC: 33.6 g/dL (ref 30.0–36.0)
MCV: 87.3 fL (ref 80.0–100.0)
Monocytes Absolute: 0.9 10*3/uL (ref 0.1–1.0)
Monocytes Relative: 13 %
Neutro Abs: 3.9 10*3/uL (ref 1.7–7.7)
Neutrophils Relative %: 56 %
Platelets: 521 10*3/uL — ABNORMAL HIGH (ref 150–400)
RBC: 4.95 MIL/uL (ref 3.87–5.11)
RDW: 14.4 % (ref 11.5–15.5)
WBC: 7.1 10*3/uL (ref 4.0–10.5)
nRBC: 0 % (ref 0.0–0.2)

## 2019-07-21 LAB — MAGNESIUM: Magnesium: 1.8 mg/dL (ref 1.7–2.4)

## 2019-07-21 LAB — PHOSPHORUS: Phosphorus: 4.2 mg/dL (ref 2.5–4.6)

## 2019-07-24 DIAGNOSIS — Z940 Kidney transplant status: Principal | ICD-10-CM

## 2019-08-11 ENCOUNTER — Ambulatory Visit (INDEPENDENT_AMBULATORY_CARE_PROVIDER_SITE_OTHER): Payer: Medicare Other

## 2019-08-11 ENCOUNTER — Other Ambulatory Visit: Payer: Self-pay

## 2019-08-11 ENCOUNTER — Encounter (INDEPENDENT_AMBULATORY_CARE_PROVIDER_SITE_OTHER): Payer: Self-pay | Admitting: Vascular Surgery

## 2019-08-11 ENCOUNTER — Ambulatory Visit (INDEPENDENT_AMBULATORY_CARE_PROVIDER_SITE_OTHER): Payer: Medicare Other | Admitting: Vascular Surgery

## 2019-08-11 VITALS — BP 191/70 | HR 66 | Resp 16 | Ht 63.0 in | Wt 155.6 lb

## 2019-08-11 DIAGNOSIS — E1159 Type 2 diabetes mellitus with other circulatory complications: Secondary | ICD-10-CM | POA: Diagnosis not present

## 2019-08-11 DIAGNOSIS — K219 Gastro-esophageal reflux disease without esophagitis: Secondary | ICD-10-CM | POA: Diagnosis not present

## 2019-08-11 DIAGNOSIS — I70213 Atherosclerosis of native arteries of extremities with intermittent claudication, bilateral legs: Secondary | ICD-10-CM | POA: Diagnosis not present

## 2019-08-11 DIAGNOSIS — Z794 Long term (current) use of insulin: Secondary | ICD-10-CM

## 2019-08-11 DIAGNOSIS — E782 Mixed hyperlipidemia: Secondary | ICD-10-CM | POA: Diagnosis not present

## 2019-08-11 NOTE — Progress Notes (Signed)
MRN : [id number]  Cindy Fischer is a 73 y.o. (1946/08/13) female who presents with chief complaint of No chief complaint on file. Marland Kitchen  History of Present Illness:   The patient returns to the office for 6 month followup and review status post angiogram with intervention.  Procedure(s) Performed10/23/2019: 1. Introduction catheter intorightlower extremity 3rd order catheter placement  2.Contrast injection rightlower extremity for distal runoff  3. Crosser atherectomy of therightSFA and popliteal arteries 4. Percutaneous transluminal angioplasty and stent placementrightsuperficial femoral artery and popliteal 5. Star close closureleftcommon femoral arteriotomy  The patient notes improvement in the lower extremity symptoms. No interval shortening of the patient's claudication distance or rest pain symptoms. Previous wounds have now healed. No new ulcers or wounds have occurred since the last visit.  There have been no significant changes to the patient's overall health care.  The patient denies amaurosis fugax or recent TIA symptoms. There are no recent neurological changes noted. The patient denies history of DVT, PE or superficial thrombophlebitis. The patient denies recent episodes of angina or shortness of breath.   ABI's Rt=[place] (TBI=0.85)and Lt=[place] (TBI=0.94)(triphasic signals) Previous ABI (TBI) Rt=0.61 and Lt=0.63  Duplex ultrasound of the right lower extremity  Shows the SFA and popliteal  Stent is widely patent  No outpatient medications have been marked as taking for the 08/11/19 encounter (Appointment) with Delana Meyer, Dolores Lory, MD.    Past Medical History:  Diagnosis Date  . Cancer ([place]) 2016   skin (nose)  . Diabetes mellitus without complication ([place])   . Hypothyroidism   . Pneumonia   . Renal disorder     Past Surgical History:  Procedure Laterality Date  . CATARACT EXTRACTION,  BILATERAL    . COLONOSCOPY  12/02/2010  . COLONOSCOPY WITH PROPOFOL N/A 09/04/2016   Procedure: COLONOSCOPY WITH PROPOFOL;  Surgeon: Christene Lye, MD;  Location: [facility] ENDOSCOPY;  Service: Endoscopy;  Laterality: N/A;  . EYE SURGERY    . INSERTION OF DIALYSIS CATHETER  2014   removed 2014 (only had in for about 8 weeks)  . laser surgery on eyes  1999  . LOWER EXTREMITY ANGIOGRAPHY Right 10/16/2018   Procedure: LOWER EXTREMITY ANGIOGRAPHY;  Surgeon: Katha Cabal, MD;  Location: [place] [facility];  Service: Cardiovascular;  Laterality: Right;  . 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 TONSILLECTOMY    . TUBAL LIGATION      Social History Social History   Tobacco Use  . Smoking status: Former Smoker    Types: Cigarettes  . Smokeless tobacco: Never Used  Substance Use Topics  . Alcohol use: No  . Drug use: No    Family History Family History  Problem Relation Age of Onset  . Multiple myeloma Mother   . Diabetes Father   . Heart attack Father   . Breast cancer Neg Hx     Allergies  Allergen Reactions  . Oxycodone-Acetaminophen Nausea And Vomiting  . Penicillins Rash    Has patient had a PCN reaction causing immediate rash, facial/tongue/throat swelling, SOB or lightheadedness with hypotension: Yes Has patient had a PCN reaction causing severe rash involving mucus membranes or skin necrosis: No Has patient had a PCN reaction that required hospitalization: No Has patient had a PCN reaction occurring within the last 10 years: No If all of the above answers are "NO", then may proceed with Cephalosporin use.      REVIEW OF SYSTEMS (Negative unless checked)  Constitutional: []Weight loss  []Fever  []Chills Cardiac: []  Chest pain   []Chest pressure   []Palpitations   []Shortness of breath when laying flat   []Shortness of breath with exertion. Vascular:  []Pain in legs with walking   []Pain in legs at rest  []History of DVT   []Phlebitis   []Swelling in  legs   []Varicose veins   []Non-healing ulcers Pulmonary:   []Uses home oxygen   []Productive cough   []Hemoptysis   []Wheeze  []COPD   []Asthma Neurologic:  []Dizziness   []Seizures   []History of stroke   []History of TIA  []Aphasia   []Vissual changes   []Weakness or numbness in arm   []Weakness or numbness in leg Musculoskeletal:   []Joint swelling   []Joint pain   []Low back pain Hematologic:  []Easy bruising  []Easy bleeding   []Hypercoagulable state   []Anemic Gastrointestinal:  []Diarrhea   []Vomiting  []Gastroesophageal reflux/heartburn   []Difficulty swallowing. Genitourinary:  [x]Chronic kidney disease   []Difficult urination  []Frequent urination   []Blood in urine Skin:  []Rashes   []Ulcers  Psychological:  []History of anxiety   [] History of major depression.  Physical Examination  There were no vitals filed for this visit. There is no height or weight on file to calculate BMI. Gen: WD/WN, NAD Head: [place]/AT, No temporalis wasting.  Ear/Nose/Throat: Hearing grossly intact, nares w/o erythema or drainage Eyes: PER, EOMI, sclera nonicteric.  Neck: Supple, no large masses.   Pulmonary:  Good air movement, no audible wheezing bilaterally, no use of accessory muscles.  Cardiac: RRR, no JVD Vascular:  Scattered small varicosities present bilaterally.  Mild venous stasis changes to the legs bilaterally.  2+ soft pitting edema Vessel Right Left  Radial Palpable Palpable  PT Trace Palpable Palpable  DP Not Palpable Palpable  Gastrointestinal: Non-distended. No guarding/no peritoneal signs.  Musculoskeletal: M/S 5/5 throughout.  No deformity or atrophy.  Neurologic: CN 2-12 intact. Symmetrical.  Speech is fluent. Motor exam as listed above. Psychiatric: Judgment intact, Mood & affect appropriate for pt's clinical situation. Dermatologic: No rashes or ulcers noted.  No changes consistent with cellulitis. Lymph : No lichenification or skin changes of chronic lymphedema.  CBC Lab  Results  Component Value Date   WBC 7.1 07/21/2019   HGB 14.5 07/21/2019   HCT 43.2 07/21/2019   MCV 87.3 07/21/2019   PLT 521 (H) 07/21/2019    BMET    Component Value Date/Time   NA 135 07/21/2019 0838   NA 138 03/29/2015 0918   K 5.1 07/21/2019 0838   K 4.5 03/29/2015 0918   CL 101 07/21/2019 0838   CL 104 03/29/2015 0918   CO2 28 07/21/2019 0838   CO2 26 03/29/2015 0918   GLUCOSE 97 07/21/2019 0838   GLUCOSE 187 (H) 03/29/2015 0918   BUN 11 07/21/2019 0838   BUN 10 03/29/2015 0918   CREATININE 0.70 07/21/2019 0838   CREATININE 0.80 03/29/2015 0918   CALCIUM 9.5 07/21/2019 0838   CALCIUM 9.1 03/29/2015 0918   GFRNONAA >60 07/21/2019 0838   GFRNONAA >60 03/29/2015 0918   GFRAA >60 07/21/2019 0838   GFRAA >60 03/29/2015 0918   CrCl cannot be calculated (Patient's most recent lab result is older than the maximum 21 days allowed.).  COAG No results found for: INR, PROTIME  Radiology No results found.   Assessment/Plan 1. Atherosclerosis of native artery of both lower extremities with intermittent claudication (HCC)  Recommend:  The patient has evidence of atherosclerosis of the lower extremities with claudication.  The  patient does not voice lifestyle limiting changes at this point in time.  Noninvasive studies do not suggest clinically significant change.  No invasive studies, angiography or surgery at this time The patient should continue walking and begin a more formal exercise program.  The patient should continue antiplatelet therapy and aggressive treatment of the lipid abnormalities  No changes in the patient's medications at this time  The patient should continue wearing graduated compression socks 10-15 mmHg strength to control the mild edema.   - VAS [place] ABI WITH/WO TBI; Future - VAS [place] LOWER EXTREMITY ARTERIAL DUPLEX; Future  2. Mixed hyperlipidemia Continue statin as ordered and reviewed, no changes at this time   3. Type 2 diabetes mellitus  with other circulatory complication, with long-term current use of insulin (HCC) Continue hypoglycemic medications as already ordered, these medications have been reviewed and there are no changes at this time.  Hgb A1C to be monitored as already arranged by primary service   4. Gastroesophageal reflux disease without esophagitis Continue PPI as already ordered, this medication has been reviewed and there are no changes at this time.  Avoidence of caffeine and alcohol  Moderate elevation of the head of the bed    Hortencia Pilar, MD  08/11/2019 10:44 AM

## 2019-08-22 ENCOUNTER — Encounter: Admit: 2019-08-22 | Discharge: 2019-08-22 | Payer: MEDICARE | Attending: Nephrology | Primary: Nephrology

## 2019-08-22 ENCOUNTER — Other Ambulatory Visit: Payer: Self-pay

## 2019-08-22 ENCOUNTER — Other Ambulatory Visit
Admission: RE | Admit: 2019-08-22 | Discharge: 2019-08-22 | Disposition: A | Discharge status: Home / Self Care | Payer: Medicare Other | Attending: Nephrology | Admitting: Nephrology

## 2019-08-22 DIAGNOSIS — Z79899 Other long term (current) drug therapy: Secondary | ICD-10-CM | POA: Insufficient documentation

## 2019-08-22 DIAGNOSIS — Z9483 Pancreas transplant status: Secondary | ICD-10-CM | POA: Diagnosis not present

## 2019-08-22 DIAGNOSIS — Z940 Kidney transplant status: Secondary | ICD-10-CM | POA: Insufficient documentation

## 2019-08-22 DIAGNOSIS — Z09 Encounter for follow-up examination after completed treatment for conditions other than malignant neoplasm: Secondary | ICD-10-CM | POA: Insufficient documentation

## 2019-08-22 DIAGNOSIS — D899 Disorder involving the immune mechanism, unspecified: Secondary | ICD-10-CM | POA: Insufficient documentation

## 2019-08-22 DIAGNOSIS — Z114 Encounter for screening for human immunodeficiency virus [HIV]: Secondary | ICD-10-CM | POA: Insufficient documentation

## 2019-08-22 DIAGNOSIS — E559 Vitamin D deficiency, unspecified: Secondary | ICD-10-CM | POA: Insufficient documentation

## 2019-08-22 DIAGNOSIS — D631 Anemia in chronic kidney disease: Secondary | ICD-10-CM | POA: Insufficient documentation

## 2019-08-22 DIAGNOSIS — E1129 Type 2 diabetes mellitus with other diabetic kidney complication: Secondary | ICD-10-CM | POA: Diagnosis not present

## 2019-08-22 DIAGNOSIS — N390 Urinary tract infection, site not specified: Secondary | ICD-10-CM | POA: Insufficient documentation

## 2019-08-22 DIAGNOSIS — B259 Cytomegaloviral disease, unspecified: Secondary | ICD-10-CM | POA: Insufficient documentation

## 2019-08-22 DIAGNOSIS — Z789 Other specified health status: Secondary | ICD-10-CM | POA: Diagnosis not present

## 2019-08-22 DIAGNOSIS — N189 Chronic kidney disease, unspecified: Secondary | ICD-10-CM | POA: Diagnosis not present

## 2019-08-22 LAB — CBC WITH DIFFERENTIAL/PLATELET
Abs Immature Granulocytes: 0.07 10*3/uL (ref 0.00–0.07)
Basophils Absolute: 0 10*3/uL (ref 0.0–0.1)
Basophils Relative: 0 %
Eosinophils Absolute: 0.1 10*3/uL (ref 0.0–0.5)
Eosinophils Relative: 2 %
HCT: 40.8 % (ref 36.0–46.0)
Hemoglobin: 13.6 g/dL (ref 12.0–15.0)
Immature Granulocytes: 1 %
Lymphocytes Relative: 29 %
Lymphs Abs: 2.2 10*3/uL (ref 0.7–4.0)
MCH: 29.3 pg (ref 26.0–34.0)
MCHC: 33.3 g/dL (ref 30.0–36.0)
MCV: 87.9 fL (ref 80.0–100.0)
Monocytes Absolute: 0.9 10*3/uL (ref 0.1–1.0)
Monocytes Relative: 12 %
Neutro Abs: 4.2 10*3/uL (ref 1.7–7.7)
Neutrophils Relative %: 56 %
Platelets: 479 10*3/uL — ABNORMAL HIGH (ref 150–400)
RBC: 4.64 MIL/uL (ref 3.87–5.11)
RDW: 14.7 % (ref 11.5–15.5)
WBC: 7.6 10*3/uL (ref 4.0–10.5)
nRBC: 0 % (ref 0.0–0.2)

## 2019-08-22 LAB — BASIC METABOLIC PANEL
Anion gap: 9 (ref 5–15)
BUN: 17 mg/dL (ref 8–23)
CO2: 25 mmol/L (ref 22–32)
Calcium: 9.2 mg/dL (ref 8.9–10.3)
Chloride: 103 mmol/L (ref 98–111)
Creatinine, Ser: 0.84 mg/dL (ref 0.44–1.00)
GFR calc Af Amer: >60 mL/min (ref >60)
GFR calc non Af Amer: >60 mL/min (ref >60)
Glucose, Bld: 99 mg/dL (ref 70–99)
Potassium: 4.1 mmol/L (ref 3.5–5.1)
Sodium: 137 mmol/L (ref 135–145)

## 2019-08-22 LAB — PHOSPHORUS: Phosphorus: 3.7 mg/dL (ref 2.5–4.6)

## 2019-08-22 LAB — MAGNESIUM: Magnesium: 1.8 mg/dL (ref 1.7–2.4)

## 2019-08-26 DIAGNOSIS — Z940 Kidney transplant status: Secondary | ICD-10-CM

## 2019-09-05 DIAGNOSIS — Z940 Kidney transplant status: Secondary | ICD-10-CM

## 2019-09-05 MED ORDER — TACROLIMUS 0.5 MG CAPSULE
ORAL_CAPSULE | 7 refills | 0 days | Status: CP
Start: 2019-09-05 — End: ?

## 2019-09-22 ENCOUNTER — Encounter: Admit: 2019-09-22 | Discharge: 2019-09-22 | Payer: MEDICARE | Attending: Nephrology | Primary: Nephrology

## 2019-09-22 ENCOUNTER — Other Ambulatory Visit: Payer: Self-pay

## 2019-09-22 ENCOUNTER — Other Ambulatory Visit
Admission: RE | Admit: 2019-09-22 | Discharge: 2019-09-22 | Disposition: A | Discharge status: Home / Self Care | Payer: Medicare Other | Attending: Nephrology | Admitting: Nephrology

## 2019-09-22 DIAGNOSIS — Z940 Kidney transplant status: Secondary | ICD-10-CM | POA: Diagnosis not present

## 2019-09-22 DIAGNOSIS — Z09 Encounter for follow-up examination after completed treatment for conditions other than malignant neoplasm: Secondary | ICD-10-CM | POA: Insufficient documentation

## 2019-09-22 DIAGNOSIS — N390 Urinary tract infection, site not specified: Secondary | ICD-10-CM | POA: Diagnosis not present

## 2019-09-22 DIAGNOSIS — E1129 Type 2 diabetes mellitus with other diabetic kidney complication: Secondary | ICD-10-CM | POA: Diagnosis not present

## 2019-09-22 DIAGNOSIS — D631 Anemia in chronic kidney disease: Secondary | ICD-10-CM | POA: Diagnosis not present

## 2019-09-22 DIAGNOSIS — Z114 Encounter for screening for human immunodeficiency virus [HIV]: Secondary | ICD-10-CM | POA: Insufficient documentation

## 2019-09-22 DIAGNOSIS — Z79899 Other long term (current) drug therapy: Secondary | ICD-10-CM | POA: Diagnosis not present

## 2019-09-22 DIAGNOSIS — Z9483 Pancreas transplant status: Secondary | ICD-10-CM | POA: Insufficient documentation

## 2019-09-22 DIAGNOSIS — D899 Disorder involving the immune mechanism, unspecified: Secondary | ICD-10-CM | POA: Diagnosis not present

## 2019-09-22 DIAGNOSIS — B259 Cytomegaloviral disease, unspecified: Secondary | ICD-10-CM | POA: Diagnosis present

## 2019-09-22 DIAGNOSIS — Z789 Other specified health status: Secondary | ICD-10-CM | POA: Diagnosis not present

## 2019-09-22 DIAGNOSIS — E559 Vitamin D deficiency, unspecified: Secondary | ICD-10-CM | POA: Insufficient documentation

## 2019-09-22 LAB — CBC WITH DIFFERENTIAL/PLATELET
Abs Immature Granulocytes: 0.07 10*3/uL (ref 0.00–0.07)
Basophils Absolute: 0 10*3/uL (ref 0.0–0.1)
Basophils Relative: 0 %
Eosinophils Absolute: 0.1 10*3/uL (ref 0.0–0.5)
Eosinophils Relative: 1 %
HCT: 41.9 % (ref 36.0–46.0)
Hemoglobin: 14.1 g/dL (ref 12.0–15.0)
Immature Granulocytes: 1 %
Lymphocytes Relative: 27 %
Lymphs Abs: 2.3 10*3/uL (ref 0.7–4.0)
MCH: 29 pg (ref 26.0–34.0)
MCHC: 33.7 g/dL (ref 30.0–36.0)
MCV: 86 fL (ref 80.0–100.0)
Monocytes Absolute: 1 10*3/uL (ref 0.1–1.0)
Monocytes Relative: 12 %
Neutro Abs: 5 10*3/uL (ref 1.7–7.7)
Neutrophils Relative %: 59 %
Platelets: 520 10*3/uL — ABNORMAL HIGH (ref 150–400)
RBC: 4.87 MIL/uL (ref 3.87–5.11)
RDW: 14.7 % (ref 11.5–15.5)
WBC: 8.5 10*3/uL (ref 4.0–10.5)
nRBC: 0 % (ref 0.0–0.2)

## 2019-09-22 LAB — BASIC METABOLIC PANEL
Anion gap: 8 (ref 5–15)
BUN: 14 mg/dL (ref 8–23)
CO2: 25 mmol/L (ref 22–32)
Calcium: 9.1 mg/dL (ref 8.9–10.3)
Chloride: 103 mmol/L (ref 98–111)
Creatinine, Ser: 0.7 mg/dL (ref 0.44–1.00)
GFR calc Af Amer: >60 mL/min (ref >60)
GFR calc non Af Amer: >60 mL/min (ref >60)
Glucose, Bld: 99 mg/dL (ref 70–99)
Potassium: 3.8 mmol/L (ref 3.5–5.1)
Sodium: 136 mmol/L (ref 135–145)

## 2019-09-22 LAB — MAGNESIUM: Magnesium: 1.8 mg/dL (ref 1.7–2.4)

## 2019-09-22 LAB — PHOSPHORUS: Phosphorus: 3.7 mg/dL (ref 2.5–4.6)

## 2019-09-23 MED ORDER — LEVOTHYROXINE 100 MCG TABLET
ORAL_TABLET | Freq: Every day | ORAL | 11 refills | 90 days | Status: CP
Start: 2019-09-23 — End: 2020-09-22

## 2019-09-25 DIAGNOSIS — Z940 Kidney transplant status: Secondary | ICD-10-CM

## 2019-09-30 DIAGNOSIS — Z940 Kidney transplant status: Secondary | ICD-10-CM

## 2019-09-30 MED ORDER — EVEROLIMUS (IMMUNOSUPPRESSIVE) 0.5 MG TABLET
ORAL_TABLET | Freq: Two times a day (BID) | ORAL | 5 refills | 30 days | Status: CP
Start: 2019-09-30 — End: 2020-03-28

## 2019-10-08 ENCOUNTER — Ambulatory Visit
Admission: RE | Admit: 2019-10-08 | Discharge: 2019-10-08 | Disposition: A | Discharge status: Home / Self Care | Payer: Medicare Other | Source: Ambulatory Visit | Attending: Internal Medicine | Admitting: Internal Medicine

## 2019-10-08 ENCOUNTER — Other Ambulatory Visit: Payer: Self-pay | Admitting: Internal Medicine

## 2019-10-08 ENCOUNTER — Ambulatory Visit
Admission: RE | Admit: 2019-10-08 | Discharge: 2019-10-08 | Disposition: A | Discharge status: Home / Self Care | Payer: Medicare Other | Attending: Internal Medicine | Admitting: Internal Medicine

## 2019-10-08 ENCOUNTER — Other Ambulatory Visit: Payer: Self-pay

## 2019-10-08 DIAGNOSIS — M25531 Pain in right wrist: Secondary | ICD-10-CM

## 2019-10-08 DIAGNOSIS — M79642 Pain in left hand: Secondary | ICD-10-CM | POA: Insufficient documentation

## 2019-10-08 DIAGNOSIS — M79641 Pain in right hand: Secondary | ICD-10-CM

## 2019-10-08 DIAGNOSIS — M25532 Pain in left wrist: Secondary | ICD-10-CM | POA: Insufficient documentation

## 2019-10-23 DIAGNOSIS — E559 Vitamin D deficiency, unspecified: Principal | ICD-10-CM

## 2019-10-23 DIAGNOSIS — E119 Type 2 diabetes mellitus without complications: Principal | ICD-10-CM

## 2019-10-23 DIAGNOSIS — Z940 Kidney transplant status: Principal | ICD-10-CM

## 2019-10-23 DIAGNOSIS — Z0000 Encounter for general adult medical examination without abnormal findings: Principal | ICD-10-CM

## 2019-10-23 DIAGNOSIS — Z1159 Encounter for screening for other viral diseases: Principal | ICD-10-CM

## 2019-10-23 DIAGNOSIS — Z114 Encounter for screening for human immunodeficiency virus [HIV]: Principal | ICD-10-CM

## 2019-10-24 ENCOUNTER — Ambulatory Visit: Admit: 2019-10-24 | Discharge: 2019-10-24 | Payer: MEDICARE | Attending: Nephrology | Primary: Nephrology

## 2019-10-24 ENCOUNTER — Ambulatory Visit: Admit: 2019-10-24 | Discharge: 2019-10-24 | Payer: MEDICARE

## 2019-10-24 MED ORDER — DICLOFENAC 1 % TOPICAL GEL
Freq: Four times a day (QID) | TOPICAL | 7 refills | 13.00000 days | Status: CP
Start: 2019-10-24 — End: 2020-10-23

## 2019-11-12 ENCOUNTER — Telehealth (INDEPENDENT_AMBULATORY_CARE_PROVIDER_SITE_OTHER): Payer: Self-pay

## 2019-11-12 NOTE — Telephone Encounter (Signed)
The bruising wouldn't be due to the previous stent placement because it was over a year ago, so that is likely unrelated.  We can have her come in with ABIs and a RLE DVT study

## 2019-11-12 NOTE — Telephone Encounter (Signed)
LVM FOR PATIENT TO CALL us BACK TO SCHEDULE

## 2019-11-14 ENCOUNTER — Other Ambulatory Visit (INDEPENDENT_AMBULATORY_CARE_PROVIDER_SITE_OTHER): Payer: Self-pay | Admitting: Nurse Practitioner

## 2019-11-14 DIAGNOSIS — M7989 Other specified soft tissue disorders: Secondary | ICD-10-CM

## 2019-11-14 DIAGNOSIS — Z95820 Peripheral vascular angioplasty status with implants and grafts: Secondary | ICD-10-CM

## 2019-11-17 ENCOUNTER — Ambulatory Visit (INDEPENDENT_AMBULATORY_CARE_PROVIDER_SITE_OTHER): Payer: Medicare Other

## 2019-11-17 ENCOUNTER — Other Ambulatory Visit: Payer: Self-pay

## 2019-11-17 ENCOUNTER — Ambulatory Visit (INDEPENDENT_AMBULATORY_CARE_PROVIDER_SITE_OTHER): Payer: Medicare Other | Admitting: Nurse Practitioner

## 2019-11-17 ENCOUNTER — Encounter (INDEPENDENT_AMBULATORY_CARE_PROVIDER_SITE_OTHER): Payer: Self-pay | Admitting: Nurse Practitioner

## 2019-11-17 VITALS — BP 160/60 | HR 81 | Resp 19 | Ht 63.0 in | Wt 164.0 lb

## 2019-11-17 DIAGNOSIS — Z95820 Peripheral vascular angioplasty status with implants and grafts: Secondary | ICD-10-CM | POA: Diagnosis not present

## 2019-11-17 DIAGNOSIS — I70213 Atherosclerosis of native arteries of extremities with intermittent claudication, bilateral legs: Secondary | ICD-10-CM | POA: Diagnosis not present

## 2019-11-17 DIAGNOSIS — R600 Localized edema: Secondary | ICD-10-CM

## 2019-11-17 DIAGNOSIS — E782 Mixed hyperlipidemia: Secondary | ICD-10-CM | POA: Diagnosis not present

## 2019-11-17 DIAGNOSIS — M7989 Other specified soft tissue disorders: Secondary | ICD-10-CM

## 2019-11-24 ENCOUNTER — Encounter: Admit: 2019-11-24 | Discharge: 2019-11-24 | Payer: MEDICARE | Attending: Nephrology | Primary: Nephrology

## 2019-11-24 ENCOUNTER — Encounter (INDEPENDENT_AMBULATORY_CARE_PROVIDER_SITE_OTHER): Payer: Self-pay | Admitting: Nurse Practitioner

## 2019-11-24 ENCOUNTER — Other Ambulatory Visit
Admission: RE | Admit: 2019-11-24 | Discharge: 2019-11-24 | Disposition: A | Discharge status: Home / Self Care | Payer: Medicare Other | Attending: Nephrology | Admitting: Nephrology

## 2019-11-24 DIAGNOSIS — N189 Chronic kidney disease, unspecified: Secondary | ICD-10-CM | POA: Diagnosis not present

## 2019-11-24 DIAGNOSIS — Z9483 Pancreas transplant status: Secondary | ICD-10-CM | POA: Diagnosis not present

## 2019-11-24 DIAGNOSIS — N390 Urinary tract infection, site not specified: Secondary | ICD-10-CM | POA: Insufficient documentation

## 2019-11-24 DIAGNOSIS — D631 Anemia in chronic kidney disease: Secondary | ICD-10-CM | POA: Diagnosis not present

## 2019-11-24 DIAGNOSIS — Z940 Kidney transplant status: Secondary | ICD-10-CM | POA: Diagnosis not present

## 2019-11-24 DIAGNOSIS — T8610 Unspecified complication of kidney transplant: Secondary | ICD-10-CM | POA: Insufficient documentation

## 2019-11-24 DIAGNOSIS — Z79899 Other long term (current) drug therapy: Secondary | ICD-10-CM | POA: Diagnosis not present

## 2019-11-24 DIAGNOSIS — Z114 Encounter for screening for human immunodeficiency virus [HIV]: Secondary | ICD-10-CM | POA: Diagnosis not present

## 2019-11-24 DIAGNOSIS — B259 Cytomegaloviral disease, unspecified: Secondary | ICD-10-CM | POA: Insufficient documentation

## 2019-11-24 DIAGNOSIS — Z09 Encounter for follow-up examination after completed treatment for conditions other than malignant neoplasm: Secondary | ICD-10-CM | POA: Diagnosis not present

## 2019-11-24 DIAGNOSIS — E559 Vitamin D deficiency, unspecified: Secondary | ICD-10-CM | POA: Diagnosis not present

## 2019-11-24 DIAGNOSIS — D899 Disorder involving the immune mechanism, unspecified: Secondary | ICD-10-CM | POA: Insufficient documentation

## 2019-11-24 DIAGNOSIS — Z789 Other specified health status: Secondary | ICD-10-CM | POA: Insufficient documentation

## 2019-11-24 DIAGNOSIS — E1129 Type 2 diabetes mellitus with other diabetic kidney complication: Secondary | ICD-10-CM | POA: Diagnosis not present

## 2019-11-24 LAB — CBC WITH DIFFERENTIAL/PLATELET
Abs Immature Granulocytes: 0.11 10*3/uL — ABNORMAL HIGH (ref 0.00–0.07)
Basophils Absolute: 0 10*3/uL (ref 0.0–0.1)
Basophils Relative: 0 %
Eosinophils Absolute: 0.1 10*3/uL (ref 0.0–0.5)
Eosinophils Relative: 1 %
HCT: 41.8 % (ref 36.0–46.0)
Hemoglobin: 13.9 g/dL (ref 12.0–15.0)
Immature Granulocytes: 2 %
Lymphocytes Relative: 29 %
Lymphs Abs: 2 10*3/uL (ref 0.7–4.0)
MCH: 29.5 pg (ref 26.0–34.0)
MCHC: 33.3 g/dL (ref 30.0–36.0)
MCV: 88.7 fL (ref 80.0–100.0)
Monocytes Absolute: 1 10*3/uL (ref 0.1–1.0)
Monocytes Relative: 15 %
Neutro Abs: 3.6 10*3/uL (ref 1.7–7.7)
Neutrophils Relative %: 53 %
Platelets: 465 10*3/uL — ABNORMAL HIGH (ref 150–400)
RBC: 4.71 MIL/uL (ref 3.87–5.11)
RDW: 15.4 % (ref 11.5–15.5)
WBC: 6.9 10*3/uL (ref 4.0–10.5)
nRBC: 0 % (ref 0.0–0.2)

## 2019-11-24 LAB — BASIC METABOLIC PANEL
Anion gap: 9 (ref 5–15)
BUN: 14 mg/dL (ref 8–23)
CO2: 24 mmol/L (ref 22–32)
Calcium: 9.1 mg/dL (ref 8.9–10.3)
Chloride: 102 mmol/L (ref 98–111)
Creatinine, Ser: 0.75 mg/dL (ref 0.44–1.00)
GFR calc Af Amer: >60 mL/min (ref >60)
GFR calc non Af Amer: >60 mL/min (ref >60)
Glucose, Bld: 123 mg/dL — ABNORMAL HIGH (ref 70–99)
Potassium: 4 mmol/L (ref 3.5–5.1)
Sodium: 135 mmol/L (ref 135–145)

## 2019-11-24 LAB — MAGNESIUM: Magnesium: 1.8 mg/dL (ref 1.7–2.4)

## 2019-11-24 LAB — PHOSPHORUS: Phosphorus: 3.6 mg/dL (ref 2.5–4.6)

## 2019-11-24 NOTE — Progress Notes (Signed)
SUBJECTIVE:  Patient ID: Cindy Fischer, female    DOB: 22-May-1946, 73 y.o.   MRN: [id number] Chief Complaint  Patient presents with  . Follow-up    HPI  Cindy Fischer is a 73 y.o. female presents today with concern of the swelling in her right lower extremity.  The patient states that the swelling began approximately last week and it has come and go.  She states that the swelling is uncomfortable when it happens.  She states that with extended elevation of her right lower extremity it decreases.  The swelling goes from her foot to her hip.  She denies any precipitating events.  She denies any fever, chills, nausea, vomiting or diarrhea.  She denies any changes with her medication.  The patient does have a previous history of kidney transplant.  She recently underwent angiogram of her right lower extremity on 10/15/2018, and she has not had any issues previously.  She denies any claudication or rest pain like symptoms.  The patient also does note that her right arm has been swollen for some time as well.  Initially it was thought to be due to her old fistula however even after ligation of her fistula the swelling still remain.  Patient also has a slightly swollen right eye as well.  Today noninvasive study showed no evidence of DVT.  No evidence of superficial venous thrombosis in the right lower extremity.  There is no evidence of common femoral vein obstruction in the left lower extremity.  The right SFA, SFA stent, popliteal and posterior tibial arteries of the right lower extremity are all patent.  No evidence of stenosis.  Past Medical History:  Diagnosis Date  . Cancer ([place]) 2016   skin (nose)  . Diabetes mellitus without complication ([place])   . Hypothyroidism   . Pneumonia   . Renal disorder     Past Surgical History:  Procedure Laterality Date  . CATARACT EXTRACTION, BILATERAL    . COLONOSCOPY  12/02/2010  . COLONOSCOPY WITH PROPOFOL N/A 09/04/2016   Procedure: COLONOSCOPY WITH  PROPOFOL;  Surgeon: Christene Lye, MD;  Location: [facility] ENDOSCOPY;  Service: Endoscopy;  Laterality: N/A;  . EYE SURGERY    . INSERTION OF DIALYSIS CATHETER  2014   removed 2014 (only had in for about 8 weeks)  . laser surgery on eyes  1999  . LOWER EXTREMITY ANGIOGRAPHY Right 10/16/2018   Procedure: LOWER EXTREMITY ANGIOGRAPHY;  Surgeon: Katha Cabal, MD;  Location: [place] [facility];  Service: Cardiovascular;  Laterality: Right;  . 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 TONSILLECTOMY    . TUBAL LIGATION      Social History   Socioeconomic History  . Marital status: Divorced    Spouse name: Not on file  . Number of children: Not on file  . Years of education: Not on file  . Highest education level: Not on file  Occupational History  . Not on file  Social Needs  . Financial resource strain: Not on file  . Food insecurity    Worry: Not on file    Inability: Not on file  . Transportation needs    Medical: Not on file    Non-medical: Not on file  Tobacco Use  . Smoking status: Former Smoker    Types: Cigarettes  . Smokeless tobacco: Never Used  Substance and Sexual Activity  . Alcohol use: No  . Drug use: No  . Sexual activity: Not on file  Lifestyle  .  Physical activity    Days per week: Not on file    Minutes per session: Not on file  . Stress: Not on file  Relationships  . Social Herbalist on phone: Not on file    Gets together: Not on file    Attends religious service: Not on file    Active member of club or organization: Not on file    Attends meetings of clubs or organizations: Not on file    Relationship status: Not on file  . Intimate partner violence    Fear of current or ex partner: Not on file    Emotionally abused: Not on file    Physically abused: Not on file    Forced sexual activity: Not on file  Other Topics Concern  . Not on file  Social History Narrative  . Not on file    Family History  Problem Relation  Age of Onset  . Multiple myeloma Mother   . Diabetes Father   . Heart attack Father   . Breast cancer Neg Hx     Allergies  Allergen Reactions  . Oxycodone-Acetaminophen Nausea And Vomiting  . Penicillins Rash    Has patient had a PCN reaction causing immediate rash, facial/tongue/throat swelling, SOB or lightheadedness with hypotension: Yes Has patient had a PCN reaction causing severe rash involving mucus membranes or skin necrosis: No Has patient had a PCN reaction that required hospitalization: No Has patient had a PCN reaction occurring within the last 10 years: No If all of the above answers are "NO", then may proceed with Cephalosporin use.      Review of Systems   Review of Systems: Negative Unless Checked Constitutional: _0 Weight loss  _1 Fever  _2 Chills Cardiac: _3 Chest pain   _4  Atrial Fibrillation  _5 Palpitations   _6 Shortness of breath when laying flat   _7 Shortness of breath with exertion. _8 Shortness of breath at rest Vascular:  _9 Pain in legs with walking   _10 Pain in legs with standing _11 Pain in legs when laying flat   _12 Claudication    _13 Pain in feet when laying flat    _14 History of DVT   _15 Phlebitis   _16 Swelling in legs   _17 Varicose veins   _18 Non-healing ulcers Pulmonary:   _19 Uses home oxygen   _20 Productive cough   _21 Hemoptysis   _22 Wheeze  _23 COPD   _24 Asthma Neurologic:  _25 Dizziness   _26 Seizures  _27 Blackouts _28 History of stroke   _29 History of TIA  _30 Aphasia   _31 Temporary Blindness   _32 Weakness or numbness in arm   _33 Weakness or numbness in leg Musculoskeletal:   _34 Joint swelling   _35 Joint pain   _36 Low back pain  _37  History of Knee Replacement _38 Arthritis _39 back Surgeries  _40  Spinal Stenosis    Hematologic:  _41 Easy bruising  _42 Easy bleeding   _43 Hypercoagulable state   _44 Anemic Gastrointestinal:  _45 Diarrhea   _46 Vomiting  _47 Gastroesophageal reflux/heartburn   _48 Difficulty swallowing. _49 Abdominal pain Genitourinary:  _50 Chronic kidney disease   _51 Difficult urination   _52 Anuric   _53 Blood in urine _54 Frequent urination  _55 Burning with urination   _56 Hematuria Skin:  _57 Rashes   _58 Ulcers _59 Wounds Psychological:  _60 History of anxiety   _61  History of major depression  _62  Memory Difficulties      OBJECTIVE:   Physical Exam  BP (!) 160/60 (BP Location: Left Arm)   Pulse 81   Resp 19   Ht _63  (1.6 m)   Wt 164 lb (74.4 kg)   BMI 29.05 kg/m   Gen: WD/WN, NAD Head: [place]/AT,  No temporalis wasting.  Ear/Nose/Throat: Hearing grossly intact, nares w/o erythema or drainage Eyes: PER, EOMI, sclera nonicteric.  Neck: Supple, no masses.  No JVD.  Pulmonary:  Good air movement, no use of accessory muscles.  Cardiac: RRR Vascular:2+ edema right lower extremity, 2+ edema right upper extremity, right eye swelling.  Vessel Right Left  Dorsalis Pedis Palpable Palpable  Posterior Tibial Palpable Palpable   Gastrointestinal: soft, non-distended. No guarding/no peritoneal signs.  Musculoskeletal: M/S 5/5 throughout.  No deformity or atrophy.  Neurologic: Pain and light touch intact in extremities.  Symmetrical.  Speech is fluent. Motor exam as listed above. Psychiatric: Judgment intact, Mood & affect appropriate for pt's clinical situation. Dermatologic: No Venous rashes. No Ulcers Noted.  No changes consistent with cellulitis. Lymph : No Cervical lymphadenopathy, no lichenification or skin changes of chronic lymphedema.       ASSESSMENT AND PLAN:  1. Localized edema The patient had no evidence of DVT in the right lower extremity.  The patient's pattern of swelling is very peculiar.  On exam today her right lower extremity, right hand as well as right eye are all swollen.  Due to the fact that it has been approximately 1 year from her stent placement this is likely not vascular related.  The patient is on a number of medications due to kidney transplant.  It is possible that this may be due to some angioneurotic edema.  The patient is advised to follow-up with her  transplant team for possible follow-up tests.  2. Atherosclerosis of native artery of both lower extremities with intermittent claudication (HCC) Noninvasive study showed  previously placed stent is patent.  The arteries of the right lower extremity show no evidence of stenosis.  Patient will return in 1 year for repeat noninvasive studies unless there is a worsening of claudication pain or any rest pain. 3. Mixed hyperlipidemia Continue statin as ordered and reviewed, no changes at this time    Current Outpatient Medications on File Prior to Visit  Medication Sig Dispense Refill  . aspirin EC 81 MG tablet Take 81 mg by mouth daily.    Marland Kitchen atorvastatin (LIPITOR) 20 MG tablet Take 20 mg by mouth daily.    . Cholecalciferol (VITAMIN D) 2000 units tablet Take 2,000 Units by mouth daily.    . diclofenac Sodium (VOLTAREN) 1 % GEL Apply 2 g topically 4 (four) times daily.    . Everolimus 0.25 MG TABS Take 0.5 mg by mouth 2 (two) times daily.    . insulin NPH Human (HUMULIN N,NOVOLIN N) 100 UNIT/ML injection Inject 30 Units into the skin daily.    . insulin regular (NOVOLIN R,HUMULIN R) 100 units/mL injection Inject 3-8 Units into the skin 3 (three) times daily before meals.    Marland Kitchen levothyroxine (SYNTHROID, LEVOTHROID) 100 MCG tablet Take 100 mcg by mouth daily before breakfast.    . lisinopril (PRINIVIL,ZESTRIL) 10 MG tablet Take 10 mg by mouth daily.    Marland Kitchen omeprazole (PRILOSEC) 40 MG capsule Take 40 mg by mouth every other day.   3  . predniSONE (DELTASONE) 5 MG tablet Take 5 mg by mouth daily.     . tacrolimus (PROGRAF) 0.5 MG capsule Take 1-1.5 mg by mouth See admin instructions. Take 1.5 mg in the morning and 1 mg in the evening    . clopidogrel (PLAVIX) 75 MG tablet TAKE 1 TABLET BY MOUTH EVERY DAY (Patient not taking: TAKE 1 TABLET BY MOUTH EVERY DAY) 90 tablet 1   No current  facility-administered medications on file prior to visit.     There are no Patient Instructions on file for this visit.  No follow-ups on file.   Kris Hartmann, NP  This note was completed with Sales executive.  Any errors are purely unintentional.

## 2019-12-03 ENCOUNTER — Other Ambulatory Visit (INDEPENDENT_AMBULATORY_CARE_PROVIDER_SITE_OTHER): Payer: Self-pay | Admitting: Nurse Practitioner

## 2019-12-08 DIAGNOSIS — Z940 Kidney transplant status: Principal | ICD-10-CM

## 2019-12-23 ENCOUNTER — Ambulatory Visit: Payer: Medicare Other | Attending: Internal Medicine

## 2019-12-23 DIAGNOSIS — Z20822 Contact with and (suspected) exposure to covid-19: Secondary | ICD-10-CM

## 2019-12-24 ENCOUNTER — Encounter: Admit: 2019-12-24 | Discharge: 2019-12-24 | Payer: MEDICARE | Attending: Nephrology | Primary: Nephrology

## 2019-12-24 ENCOUNTER — Other Ambulatory Visit
Admission: RE | Admit: 2019-12-24 | Discharge: 2019-12-24 | Disposition: A | Discharge status: Home / Self Care | Payer: Medicare Other | Attending: Nephrology | Admitting: Nephrology

## 2019-12-24 ENCOUNTER — Other Ambulatory Visit: Payer: Self-pay

## 2019-12-24 DIAGNOSIS — N390 Urinary tract infection, site not specified: Secondary | ICD-10-CM | POA: Diagnosis not present

## 2019-12-24 DIAGNOSIS — Z9483 Pancreas transplant status: Secondary | ICD-10-CM | POA: Diagnosis not present

## 2019-12-24 DIAGNOSIS — Z79899 Other long term (current) drug therapy: Secondary | ICD-10-CM | POA: Diagnosis not present

## 2019-12-24 DIAGNOSIS — D899 Disorder involving the immune mechanism, unspecified: Secondary | ICD-10-CM | POA: Diagnosis present

## 2019-12-24 DIAGNOSIS — E559 Vitamin D deficiency, unspecified: Secondary | ICD-10-CM | POA: Diagnosis not present

## 2019-12-24 DIAGNOSIS — Z114 Encounter for screening for human immunodeficiency virus [HIV]: Secondary | ICD-10-CM | POA: Diagnosis not present

## 2019-12-24 DIAGNOSIS — Z789 Other specified health status: Secondary | ICD-10-CM | POA: Diagnosis not present

## 2019-12-24 DIAGNOSIS — E1129 Type 2 diabetes mellitus with other diabetic kidney complication: Secondary | ICD-10-CM | POA: Diagnosis not present

## 2019-12-24 DIAGNOSIS — Z940 Kidney transplant status: Secondary | ICD-10-CM | POA: Diagnosis not present

## 2019-12-24 DIAGNOSIS — T8610 Unspecified complication of kidney transplant: Secondary | ICD-10-CM | POA: Diagnosis not present

## 2019-12-24 DIAGNOSIS — N189 Chronic kidney disease, unspecified: Secondary | ICD-10-CM | POA: Insufficient documentation

## 2019-12-24 DIAGNOSIS — B259 Cytomegaloviral disease, unspecified: Secondary | ICD-10-CM | POA: Insufficient documentation

## 2019-12-24 DIAGNOSIS — D631 Anemia in chronic kidney disease: Secondary | ICD-10-CM | POA: Insufficient documentation

## 2019-12-24 LAB — CBC WITH DIFFERENTIAL/PLATELET
Abs Immature Granulocytes: 0.05 10*3/uL (ref 0.00–0.07)
Basophils Absolute: 0 10*3/uL (ref 0.0–0.1)
Basophils Relative: 1 %
Eosinophils Absolute: 0.2 10*3/uL (ref 0.0–0.5)
Eosinophils Relative: 3 %
HCT: 41 % (ref 36.0–46.0)
Hemoglobin: 13.9 g/dL (ref 12.0–15.0)
Immature Granulocytes: 1 %
Lymphocytes Relative: 26 %
Lymphs Abs: 1.7 10*3/uL (ref 0.7–4.0)
MCH: 29.6 pg (ref 26.0–34.0)
MCHC: 33.9 g/dL (ref 30.0–36.0)
MCV: 87.4 fL (ref 80.0–100.0)
Monocytes Absolute: 0.9 10*3/uL (ref 0.1–1.0)
Monocytes Relative: 14 %
Neutro Abs: 3.6 10*3/uL (ref 1.7–7.7)
Neutrophils Relative %: 55 %
Platelets: 515 10*3/uL — ABNORMAL HIGH (ref 150–400)
RBC: 4.69 MIL/uL (ref 3.87–5.11)
RDW: 15 % (ref 11.5–15.5)
WBC: 6.4 10*3/uL (ref 4.0–10.5)
nRBC: 0 % (ref 0.0–0.2)

## 2019-12-24 LAB — BASIC METABOLIC PANEL
Anion gap: 9 (ref 5–15)
BUN: 21 mg/dL (ref 8–23)
CO2: 25 mmol/L (ref 22–32)
Calcium: 9.1 mg/dL (ref 8.9–10.3)
Chloride: 99 mmol/L (ref 98–111)
Creatinine, Ser: 0.77 mg/dL (ref 0.44–1.00)
GFR calc Af Amer: >60 mL/min (ref >60)
GFR calc non Af Amer: >60 mL/min (ref >60)
Glucose, Bld: 110 mg/dL — ABNORMAL HIGH (ref 70–99)
Potassium: 4 mmol/L (ref 3.5–5.1)
Sodium: 133 mmol/L — ABNORMAL LOW (ref 135–145)

## 2019-12-24 LAB — NOVEL CORONAVIRUS, NAA: SARS-CoV-2, NAA: NOT DETECTED

## 2019-12-24 LAB — MAGNESIUM: Magnesium: 1.7 mg/dL (ref 1.7–2.4)

## 2019-12-24 LAB — PHOSPHORUS: Phosphorus: 3.8 mg/dL (ref 2.5–4.6)

## 2019-12-31 DIAGNOSIS — Z940 Kidney transplant status: Principal | ICD-10-CM

## 2020-01-23 ENCOUNTER — Encounter: Admit: 2020-01-23 | Discharge: 2020-01-23 | Payer: MEDICARE | Attending: Nephrology | Primary: Nephrology

## 2020-01-23 ENCOUNTER — Ambulatory Visit
Admission: RE | Admit: 2020-01-23 | Discharge: 2020-01-23 | Disposition: A | Discharge status: Home / Self Care | Payer: Medicare Other | Attending: Nephrology | Admitting: Nephrology

## 2020-01-23 DIAGNOSIS — D631 Anemia in chronic kidney disease: Secondary | ICD-10-CM | POA: Diagnosis not present

## 2020-01-23 DIAGNOSIS — Z940 Kidney transplant status: Secondary | ICD-10-CM | POA: Insufficient documentation

## 2020-01-23 DIAGNOSIS — Z09 Encounter for follow-up examination after completed treatment for conditions other than malignant neoplasm: Secondary | ICD-10-CM | POA: Diagnosis not present

## 2020-01-23 DIAGNOSIS — E1129 Type 2 diabetes mellitus with other diabetic kidney complication: Secondary | ICD-10-CM | POA: Insufficient documentation

## 2020-01-23 DIAGNOSIS — D899 Disorder involving the immune mechanism, unspecified: Secondary | ICD-10-CM | POA: Diagnosis not present

## 2020-01-23 DIAGNOSIS — Z789 Other specified health status: Secondary | ICD-10-CM | POA: Diagnosis not present

## 2020-01-23 DIAGNOSIS — Z79899 Other long term (current) drug therapy: Secondary | ICD-10-CM | POA: Insufficient documentation

## 2020-01-23 DIAGNOSIS — E559 Vitamin D deficiency, unspecified: Secondary | ICD-10-CM | POA: Insufficient documentation

## 2020-01-23 DIAGNOSIS — Z114 Encounter for screening for human immunodeficiency virus [HIV]: Secondary | ICD-10-CM | POA: Insufficient documentation

## 2020-01-23 DIAGNOSIS — N390 Urinary tract infection, site not specified: Secondary | ICD-10-CM | POA: Diagnosis not present

## 2020-01-23 DIAGNOSIS — B259 Cytomegaloviral disease, unspecified: Secondary | ICD-10-CM | POA: Insufficient documentation

## 2020-01-23 DIAGNOSIS — Z9483 Pancreas transplant status: Secondary | ICD-10-CM | POA: Insufficient documentation

## 2020-01-23 LAB — CBC WITH DIFFERENTIAL/PLATELET
Abs Immature Granulocytes: 0.05 10*3/uL (ref 0.00–0.07)
Basophils Absolute: 0 10*3/uL (ref 0.0–0.1)
Basophils Relative: 0 %
Eosinophils Absolute: 0.1 10*3/uL (ref 0.0–0.5)
Eosinophils Relative: 2 %
HCT: 44.1 % (ref 36.0–46.0)
Hemoglobin: 14.8 g/dL (ref 12.0–15.0)
Immature Granulocytes: 1 %
Lymphocytes Relative: 23 %
Lymphs Abs: 1.6 10*3/uL (ref 0.7–4.0)
MCH: 29 pg (ref 26.0–34.0)
MCHC: 33.6 g/dL (ref 30.0–36.0)
MCV: 86.3 fL (ref 80.0–100.0)
Monocytes Absolute: 1 10*3/uL (ref 0.1–1.0)
Monocytes Relative: 15 %
Neutro Abs: 4.1 10*3/uL (ref 1.7–7.7)
Neutrophils Relative %: 59 %
Platelets: 528 10*3/uL — ABNORMAL HIGH (ref 150–400)
RBC: 5.11 MIL/uL (ref 3.87–5.11)
RDW: 14.2 % (ref 11.5–15.5)
WBC: 6.8 10*3/uL (ref 4.0–10.5)
nRBC: 0 % (ref 0.0–0.2)

## 2020-01-23 LAB — BASIC METABOLIC PANEL
Anion gap: 12 (ref 5–15)
BUN: 20 mg/dL (ref 8–23)
CO2: 25 mmol/L (ref 22–32)
Calcium: 9.4 mg/dL (ref 8.9–10.3)
Chloride: 100 mmol/L (ref 98–111)
Creatinine, Ser: 0.71 mg/dL (ref 0.44–1.00)
GFR calc Af Amer: >60 mL/min (ref >60)
GFR calc non Af Amer: >60 mL/min (ref >60)
Glucose, Bld: 89 mg/dL (ref 70–99)
Potassium: 4.7 mmol/L (ref 3.5–5.1)
Sodium: 137 mmol/L (ref 135–145)

## 2020-01-23 LAB — MAGNESIUM: Magnesium: 1.8 mg/dL (ref 1.7–2.4)

## 2020-01-23 LAB — PHOSPHORUS: Phosphorus: 4.1 mg/dL (ref 2.5–4.6)

## 2020-01-26 DIAGNOSIS — Z940 Kidney transplant status: Principal | ICD-10-CM

## 2020-01-28 ENCOUNTER — Other Ambulatory Visit: Payer: Self-pay | Admitting: Internal Medicine

## 2020-01-28 DIAGNOSIS — Z1231 Encounter for screening mammogram for malignant neoplasm of breast: Secondary | ICD-10-CM

## 2020-02-03 ENCOUNTER — Other Ambulatory Visit: Payer: Self-pay

## 2020-02-03 ENCOUNTER — Ambulatory Visit
Admission: RE | Admit: 2020-02-03 | Discharge: 2020-02-03 | Disposition: A | Discharge status: Home / Self Care | Payer: Medicare Other | Source: Ambulatory Visit | Attending: Internal Medicine | Admitting: Internal Medicine

## 2020-02-03 DIAGNOSIS — Z1231 Encounter for screening mammogram for malignant neoplasm of breast: Secondary | ICD-10-CM | POA: Insufficient documentation

## 2020-02-12 ENCOUNTER — Encounter (INDEPENDENT_AMBULATORY_CARE_PROVIDER_SITE_OTHER): Payer: Medicare Other

## 2020-02-12 ENCOUNTER — Ambulatory Visit (INDEPENDENT_AMBULATORY_CARE_PROVIDER_SITE_OTHER): Payer: Medicare Other | Admitting: Vascular Surgery

## 2020-02-12 ENCOUNTER — Encounter (INDEPENDENT_AMBULATORY_CARE_PROVIDER_SITE_OTHER): Payer: Self-pay

## 2020-02-23 ENCOUNTER — Encounter: Admit: 2020-02-23 | Discharge: 2020-02-23 | Payer: MEDICARE | Attending: Nephrology | Primary: Nephrology

## 2020-02-23 ENCOUNTER — Other Ambulatory Visit
Admission: RE | Admit: 2020-02-23 | Discharge: 2020-02-23 | Disposition: A | Discharge status: Home / Self Care | Payer: Medicare Other | Attending: Nephrology | Admitting: Nephrology

## 2020-02-23 ENCOUNTER — Other Ambulatory Visit: Payer: Self-pay

## 2020-02-23 DIAGNOSIS — Z79899 Other long term (current) drug therapy: Secondary | ICD-10-CM | POA: Diagnosis not present

## 2020-02-23 DIAGNOSIS — E1122 Type 2 diabetes mellitus with diabetic chronic kidney disease: Secondary | ICD-10-CM | POA: Insufficient documentation

## 2020-02-23 DIAGNOSIS — Z940 Kidney transplant status: Secondary | ICD-10-CM | POA: Insufficient documentation

## 2020-02-23 DIAGNOSIS — B259 Cytomegaloviral disease, unspecified: Secondary | ICD-10-CM | POA: Insufficient documentation

## 2020-02-23 DIAGNOSIS — Z9483 Pancreas transplant status: Secondary | ICD-10-CM | POA: Diagnosis not present

## 2020-02-23 DIAGNOSIS — D899 Disorder involving the immune mechanism, unspecified: Secondary | ICD-10-CM | POA: Diagnosis present

## 2020-02-23 DIAGNOSIS — E1129 Type 2 diabetes mellitus with other diabetic kidney complication: Secondary | ICD-10-CM | POA: Diagnosis not present

## 2020-02-23 DIAGNOSIS — N189 Chronic kidney disease, unspecified: Secondary | ICD-10-CM | POA: Diagnosis not present

## 2020-02-23 DIAGNOSIS — Z114 Encounter for screening for human immunodeficiency virus [HIV]: Secondary | ICD-10-CM | POA: Insufficient documentation

## 2020-02-23 DIAGNOSIS — E559 Vitamin D deficiency, unspecified: Secondary | ICD-10-CM | POA: Diagnosis not present

## 2020-02-23 DIAGNOSIS — Z789 Other specified health status: Secondary | ICD-10-CM | POA: Diagnosis not present

## 2020-02-23 DIAGNOSIS — D631 Anemia in chronic kidney disease: Secondary | ICD-10-CM | POA: Insufficient documentation

## 2020-02-23 DIAGNOSIS — N390 Urinary tract infection, site not specified: Secondary | ICD-10-CM | POA: Diagnosis not present

## 2020-02-23 LAB — CBC WITH DIFFERENTIAL/PLATELET
Abs Immature Granulocytes: 0.05 10*3/uL (ref 0.00–0.07)
Basophils Absolute: 0 10*3/uL (ref 0.0–0.1)
Basophils Relative: 0 %
Eosinophils Absolute: 0.1 10*3/uL (ref 0.0–0.5)
Eosinophils Relative: 2 %
HCT: 42.6 % (ref 36.0–46.0)
Hemoglobin: 14 g/dL (ref 12.0–15.0)
Immature Granulocytes: 1 %
Lymphocytes Relative: 29 %
Lymphs Abs: 1.8 10*3/uL (ref 0.7–4.0)
MCH: 28.6 pg (ref 26.0–34.0)
MCHC: 32.9 g/dL (ref 30.0–36.0)
MCV: 86.9 fL (ref 80.0–100.0)
Monocytes Absolute: 0.9 10*3/uL (ref 0.1–1.0)
Monocytes Relative: 15 %
Neutro Abs: 3.5 10*3/uL (ref 1.7–7.7)
Neutrophils Relative %: 53 %
Platelets: 540 10*3/uL — ABNORMAL HIGH (ref 150–400)
RBC: 4.9 MIL/uL (ref 3.87–5.11)
RDW: 14.5 % (ref 11.5–15.5)
WBC: 6.4 10*3/uL (ref 4.0–10.5)
nRBC: 0 % (ref 0.0–0.2)

## 2020-02-23 LAB — BASIC METABOLIC PANEL
Anion gap: 9 (ref 5–15)
BUN: 12 mg/dL (ref 8–23)
CO2: 25 mmol/L (ref 22–32)
Calcium: 9.1 mg/dL (ref 8.9–10.3)
Chloride: 103 mmol/L (ref 98–111)
Creatinine, Ser: 0.74 mg/dL (ref 0.44–1.00)
GFR calc Af Amer: >60 mL/min (ref >60)
GFR calc non Af Amer: >60 mL/min (ref >60)
Glucose, Bld: 76 mg/dL (ref 70–99)
Potassium: 4.1 mmol/L (ref 3.5–5.1)
Sodium: 137 mmol/L (ref 135–145)

## 2020-02-23 LAB — PHOSPHORUS: Phosphorus: 4.3 mg/dL (ref 2.5–4.6)

## 2020-02-23 LAB — MAGNESIUM: Magnesium: 1.7 mg/dL (ref 1.7–2.4)

## 2020-02-25 DIAGNOSIS — Z940 Kidney transplant status: Principal | ICD-10-CM

## 2020-03-11 DIAGNOSIS — E1169 Type 2 diabetes mellitus with other specified complication: Principal | ICD-10-CM

## 2020-03-11 DIAGNOSIS — Z48298 Encounter for aftercare following other organ transplant: Principal | ICD-10-CM

## 2020-03-11 DIAGNOSIS — E669 Obesity, unspecified: Principal | ICD-10-CM

## 2020-03-11 DIAGNOSIS — Z940 Kidney transplant status: Principal | ICD-10-CM

## 2020-03-13 DIAGNOSIS — Z940 Kidney transplant status: Principal | ICD-10-CM

## 2020-03-17 DIAGNOSIS — Z940 Kidney transplant status: Principal | ICD-10-CM

## 2020-03-17 MED ORDER — EVEROLIMUS (IMMUNOSUPPRESSIVE) 0.5 MG TABLET
ORAL_TABLET | Freq: Two times a day (BID) | ORAL | 5 refills | 30 days | Status: CP
Start: 2020-03-17 — End: 2020-09-13

## 2020-03-22 ENCOUNTER — Ambulatory Visit (INDEPENDENT_AMBULATORY_CARE_PROVIDER_SITE_OTHER): Payer: Medicare Other

## 2020-03-22 ENCOUNTER — Other Ambulatory Visit: Payer: Self-pay

## 2020-03-22 ENCOUNTER — Ambulatory Visit (INDEPENDENT_AMBULATORY_CARE_PROVIDER_SITE_OTHER): Payer: Medicare Other | Admitting: Vascular Surgery

## 2020-03-22 ENCOUNTER — Encounter (INDEPENDENT_AMBULATORY_CARE_PROVIDER_SITE_OTHER): Payer: Self-pay | Admitting: Vascular Surgery

## 2020-03-22 VITALS — BP 175/78 | HR 74 | Wt 163.0 lb

## 2020-03-22 DIAGNOSIS — I890 Lymphedema, not elsewhere classified: Secondary | ICD-10-CM | POA: Diagnosis not present

## 2020-03-22 DIAGNOSIS — I70213 Atherosclerosis of native arteries of extremities with intermittent claudication, bilateral legs: Secondary | ICD-10-CM

## 2020-03-22 DIAGNOSIS — E1159 Type 2 diabetes mellitus with other circulatory complications: Secondary | ICD-10-CM

## 2020-03-22 DIAGNOSIS — E782 Mixed hyperlipidemia: Secondary | ICD-10-CM | POA: Diagnosis not present

## 2020-03-22 DIAGNOSIS — K219 Gastro-esophageal reflux disease without esophagitis: Secondary | ICD-10-CM

## 2020-03-22 DIAGNOSIS — Z794 Long term (current) use of insulin: Secondary | ICD-10-CM

## 2020-03-22 NOTE — Progress Notes (Signed)
MRN : [id number]  Cindy Fischer is a 74 y.o. (1946/01/31) female who presents with chief complaint of  Chief Complaint  Patient presents with  . Follow-up    6 mo abi art duplex   .  History of Present Illness:   The patient returns to the office for followup and review of the noninvasive studies. There have been no interval changes in lower extremity symptoms. No interval shortening of the patient's claudication distance or development of rest pain symptoms. No new ulcers or wounds have occurred since the last visit.  The patient is also struggling with worsening of right leg leg swelling.  The swelling has persisted and the pain associated with swelling continues. There have not been any interval development of a ulcerations or wounds.  Since the previous visit the patient has been wearing graduated compression stockings and has noted little if any improvement in the lymphedema. The patient has been using compression routinely morning until night.  The patient also states elevation during the day and exercise is being done too.  The patient denies amaurosis fugax or recent TIA symptoms. There are no recent neurological changes noted. The patient denies history of DVT, PE or superficial thrombophlebitis. The patient denies recent episodes of angina or shortness of breath.   ABI Rt=[place] (triphasic signals)  and Lt=[place] (triphasic signals)   Duplex ultrasound of the arterial system bilateral lower extremities shows uniform velocities no evidence of a hemodynamically significant stenosis  Current Meds  Medication Sig  . aspirin EC 81 MG tablet Take 81 mg by mouth daily.  Marland Kitchen atorvastatin (LIPITOR) 20 MG tablet Take 20 mg by mouth daily.  . Cholecalciferol (VITAMIN D) 2000 units tablet Take 2,000 Units by mouth daily.  . Everolimus 0.25 MG TABS Take 0.5 mg by mouth 2 (two) times daily.  . insulin NPH Human (HUMULIN N,NOVOLIN N) 100 UNIT/ML injection Inject 30 Units into the skin daily.    . insulin regular (NOVOLIN R,HUMULIN R) 100 units/mL injection Inject 3-8 Units into the skin 3 (three) times daily before meals.  Marland Kitchen levothyroxine (SYNTHROID, LEVOTHROID) 100 MCG tablet Take 100 mcg by mouth daily before breakfast.  . lisinopril (PRINIVIL,ZESTRIL) 10 MG tablet Take 10 mg by mouth daily.  Marland Kitchen omeprazole (PRILOSEC) 40 MG capsule Take 40 mg by mouth every other day.   . predniSONE (DELTASONE) 5 MG tablet Take 5 mg by mouth daily.   . tacrolimus (PROGRAF) 0.5 MG capsule Take 1-1.5 mg by mouth See admin instructions. Take 1.5 mg in the morning and 1 mg in the evening    Past Medical History:  Diagnosis Date  . Cancer ([place]) 2016   skin (nose)  . Diabetes mellitus without complication ([place])   . Hypothyroidism   . Pneumonia   . Renal disorder     Past Surgical History:  Procedure Laterality Date  . CATARACT EXTRACTION, BILATERAL    . COLONOSCOPY  12/02/2010  . COLONOSCOPY WITH PROPOFOL N/A 09/04/2016   Procedure: COLONOSCOPY WITH PROPOFOL;  Surgeon: Christene Lye, MD;  Location: [facility]COPY;  Service: Endoscopy;  Laterality: N/A;  . EYE SURGERY    . INSERTION OF DIALYSIS CATHETER  2014   removed 2014 (only had in for about 8 weeks)  . laser surgery on eyes  1999  . LOWER EXTREMITY ANGIOGRAPHY Right 10/16/2018   Procedure: LOWER EXTREMITY ANGIOGRAPHY;  Surgeon: Katha Cabal, MD;  Location: [place] [facility];  Service: Cardiovascular;  Laterality: Right;  . NEPHRECTOMY TRANSPLANTED  ORGAN  10/20/2014  . TONSILLECTOMY    . TUBAL LIGATION      Social History Social History   Tobacco Use  . Smoking status: Former Smoker    Types: Cigarettes  . Smokeless tobacco: Never Used  Substance Use Topics  . Alcohol use: No  . Drug use: No    Family History Family History  Problem Relation Age of Onset  . Multiple myeloma Mother   . Diabetes Father   . Heart attack Father   . Breast cancer Neg Hx     Allergies  Allergen Reactions  .  Oxycodone-Acetaminophen Nausea And Vomiting  . Penicillins Rash    Has patient had a PCN reaction causing immediate rash, facial/tongue/throat swelling, SOB or lightheadedness with hypotension: Yes Has patient had a PCN reaction causing severe rash involving mucus membranes or skin necrosis: No Has patient had a PCN reaction that required hospitalization: No Has patient had a PCN reaction occurring within the last 10 years: No If all of the above answers are "NO", then may proceed with Cephalosporin use.      REVIEW OF SYSTEMS (Negative unless checked)  Constitutional: _0 Weight loss  _1 Fever  _2 Chills Cardiac: _3 Chest pain   _4 Chest pressure   _5 Palpitations   _6 Shortness of breath when laying flat   _7 Shortness of breath with exertion. Vascular:  _8 Pain in legs with walking   _9 Pain in legs at rest  _10 History of DVT   _11 Phlebitis   _12 Swelling in legs   _13 Varicose veins   _14 Non-healing ulcers Pulmonary:   _15 Uses home oxygen   _16 Productive cough   _17 Hemoptysis   _18 Wheeze  _19 COPD   _20 Asthma Neurologic:  _21 Dizziness   _22 Seizures   _23 History of stroke   _24 History of TIA  _25 Aphasia   _26 Vissual changes   _27 Weakness or numbness in arm   _28 Weakness or numbness in leg Musculoskeletal:   _29 Joint swelling   _30 Joint pain   _31 Low back pain Hematologic:  _32 Easy bruising  _33 Easy bleeding   _34 Hypercoagulable state   _35 Anemic Gastrointestinal:  _36 Diarrhea   _37 Vomiting  _38 Gastroesophageal reflux/heartburn   _39 Difficulty swallowing. Genitourinary:  _40 Chronic kidney disease   _41 Difficult urination  _42 Frequent urination   _43 Blood in urine Skin:  _44 Rashes   _45 Ulcers  Psychological:  _46 History of anxiety   _47  History of major depression.  Physical Examination  Vitals:   03/22/20 1407  BP: (!) 175/78  Pulse: 74  Weight: 163 lb (73.9 kg)   Body mass index is 28.87 kg/m. Gen: WD/WN, NAD Head: /AT, No temporalis wasting.  Ear/Nose/Throat: Hearing grossly intact, nares w/o erythema or drainage Eyes:  PER, EOMI, sclera nonicteric.  Neck: Supple, no large masses.   Pulmonary:  Good air movement, no audible wheezing bilaterally, no use of accessory muscles.  Cardiac: RRR, no JVD Vascular: scattered varicosities present bilaterally.  Mild venous stasis changes to the legs bilaterally.  3-4+ soft pitting edema right leg  Vessel Right Left  Radial Palpable Palpable  PT Palpable Not Palpable  DP Palpable Palpable  Gastrointestinal: Non-distended. No guarding/no peritoneal signs.  Musculoskeletal: M/S 5/5 throughout.  No deformity or atrophy.  Neurologic: CN 2-12 intact. Symmetrical.  Speech is fluent. Motor exam as listed above. Psychiatric: Judgment intact, Mood & affect appropriate for pt's clinical situation. Dermatologic: No rashes or ulcers noted.  No changes consistent with cellulitis. Lymph : No lichenification or skin changes of chronic lymphedema.  CBC Lab Results  Component Value Date   WBC 6.4 02/23/2020   HGB 14.0 02/23/2020  HCT 42.6 02/23/2020   MCV 86.9 02/23/2020   PLT 540 (H) 02/23/2020    BMET    Component Value Date/Time   NA 137 02/23/2020 0827   NA 138 03/29/2015 0918   K 4.1 02/23/2020 0827   K 4.5 03/29/2015 0918   CL 103 02/23/2020 0827   CL 104 03/29/2015 0918   CO2 25 02/23/2020 0827   CO2 26 03/29/2015 0918   GLUCOSE 76 02/23/2020 0827   GLUCOSE 187 (H) 03/29/2015 0918   BUN 12 02/23/2020 0827   BUN 10 03/29/2015 0918   CREATININE 0.74 02/23/2020 0827   CREATININE 0.80 03/29/2015 0918   CALCIUM 9.1 02/23/2020 0827   CALCIUM 9.1 03/29/2015 0918   GFRNONAA >60 02/23/2020 0827   GFRNONAA >60 03/29/2015 0918   GFRAA >60 02/23/2020 0827   GFRAA >60 03/29/2015 0918   CrCl cannot be calculated (Patient's most recent lab result is older than the maximum 21 days allowed.).  COAG No results found for: INR, PROTIME  Radiology No results found.   Assessment/Plan 1. Atherosclerosis of native artery of both lower extremities with intermittent  claudication (HCC)  Recommend:  The patient has evidence of atherosclerosis of the lower extremities with claudication.  The patient does not voice lifestyle limiting changes at this point in time.  Noninvasive studies do not suggest clinically significant change.  No invasive studies, angiography or surgery at this time The patient should continue walking and begin a more formal exercise program.  The patient should continue antiplatelet therapy and aggressive treatment of the lipid abnormalities  No changes in the patient's medications at this time  The patient should continue wearing graduated compression socks 10-15 mmHg strength to control the mild edema.   - VAS [place] LOWER EXTREMITY ARTERIAL DUPLEX; Future - VAS [place] ABI WITH/WO TBI; Future  2. Lymphedema No surgery or intervention at this point in time.    I have reviewed my discussion with the patient regarding venous insufficiency and secondary lymph edema and why it  causes symptoms. I have discussed with the patient the chronic skin changes that accompany these problems and the long term sequela such as ulceration and infection.  Patient will continue trying to wear graduated compression stockings class 1 (20-30 mmHg) on a daily basis a prescription was given to the patient to keep this updated. The patient will  put the stockings on first thing in the morning and removing them in the evening. The patient is instructed specifically not to sleep in the stockings.  In addition, behavioral modification including elevation during the day will be continued.  Diet and salt restriction was also discussed.  Previous duplex ultrasound of the lower extremities shows normal deep venous system, superficial reflux was not present.   Following the review of the ultrasound the patient will follow up in 12 months to reassess the degree of swelling and the control that graduated compression is offering.   The patient can be assessed for a Lymph Pump  at that time.  However, at this time the patient states they are satisfied with the control compression and elevation is yielding.    3. Type 2 diabetes mellitus with other circulatory complication, with long-term current use of insulin (HCC) Continue hypoglycemic medications as already ordered, these medications have been reviewed and there are no changes at this time.  Hgb A1C to be monitored as already arranged by primary service   4. Mixed hyperlipidemia Continue statin as ordered and reviewed, no changes at this time  5. Gastroesophageal reflux disease without esophagitis Continue PPI as already ordered, this medication has been reviewed and there are no changes at this time.  Avoidence of caffeine and alcohol  Moderate elevation of the head of the bed     Hortencia Pilar, MD  03/22/2020 3:37 PM

## 2020-03-25 ENCOUNTER — Encounter: Admit: 2020-03-25 | Discharge: 2020-03-25 | Payer: MEDICARE | Attending: Nephrology | Primary: Nephrology

## 2020-03-25 ENCOUNTER — Other Ambulatory Visit: Payer: Self-pay

## 2020-03-25 ENCOUNTER — Other Ambulatory Visit
Admission: RE | Admit: 2020-03-25 | Discharge: 2020-03-25 | Disposition: A | Discharge status: Home / Self Care | Payer: Medicare Other | Attending: Nephrology | Admitting: Nephrology

## 2020-03-25 DIAGNOSIS — E559 Vitamin D deficiency, unspecified: Secondary | ICD-10-CM | POA: Insufficient documentation

## 2020-03-25 DIAGNOSIS — N390 Urinary tract infection, site not specified: Secondary | ICD-10-CM | POA: Diagnosis not present

## 2020-03-25 DIAGNOSIS — Z79899 Other long term (current) drug therapy: Secondary | ICD-10-CM | POA: Diagnosis not present

## 2020-03-25 DIAGNOSIS — Z9483 Pancreas transplant status: Secondary | ICD-10-CM | POA: Insufficient documentation

## 2020-03-25 DIAGNOSIS — D631 Anemia in chronic kidney disease: Secondary | ICD-10-CM | POA: Diagnosis not present

## 2020-03-25 DIAGNOSIS — N189 Chronic kidney disease, unspecified: Secondary | ICD-10-CM | POA: Insufficient documentation

## 2020-03-25 DIAGNOSIS — Z789 Other specified health status: Secondary | ICD-10-CM | POA: Diagnosis not present

## 2020-03-25 DIAGNOSIS — E1122 Type 2 diabetes mellitus with diabetic chronic kidney disease: Secondary | ICD-10-CM | POA: Insufficient documentation

## 2020-03-25 DIAGNOSIS — E1129 Type 2 diabetes mellitus with other diabetic kidney complication: Secondary | ICD-10-CM | POA: Insufficient documentation

## 2020-03-25 DIAGNOSIS — Z114 Encounter for screening for human immunodeficiency virus [HIV]: Secondary | ICD-10-CM | POA: Diagnosis not present

## 2020-03-25 DIAGNOSIS — D899 Disorder involving the immune mechanism, unspecified: Secondary | ICD-10-CM | POA: Diagnosis present

## 2020-03-25 DIAGNOSIS — Z940 Kidney transplant status: Secondary | ICD-10-CM | POA: Insufficient documentation

## 2020-03-25 DIAGNOSIS — B259 Cytomegaloviral disease, unspecified: Secondary | ICD-10-CM | POA: Diagnosis not present

## 2020-03-25 LAB — CBC WITH DIFFERENTIAL/PLATELET
Abs Immature Granulocytes: 0.07 10*3/uL (ref 0.00–0.07)
Basophils Absolute: 0 10*3/uL (ref 0.0–0.1)
Basophils Relative: 0 %
Eosinophils Absolute: 0.1 10*3/uL (ref 0.0–0.5)
Eosinophils Relative: 2 %
HCT: 42.2 % (ref 36.0–46.0)
Hemoglobin: 13.9 g/dL (ref 12.0–15.0)
Immature Granulocytes: 1 %
Lymphocytes Relative: 26 %
Lymphs Abs: 2.1 10*3/uL (ref 0.7–4.0)
MCH: 28.7 pg (ref 26.0–34.0)
MCHC: 32.9 g/dL (ref 30.0–36.0)
MCV: 87.2 fL (ref 80.0–100.0)
Monocytes Absolute: 1.2 10*3/uL — ABNORMAL HIGH (ref 0.1–1.0)
Monocytes Relative: 15 %
Neutro Abs: 4.5 10*3/uL (ref 1.7–7.7)
Neutrophils Relative %: 56 %
Platelets: 521 10*3/uL — ABNORMAL HIGH (ref 150–400)
RBC: 4.84 MIL/uL (ref 3.87–5.11)
RDW: 14.8 % (ref 11.5–15.5)
WBC: 7.9 10*3/uL (ref 4.0–10.5)
nRBC: 0 % (ref 0.0–0.2)

## 2020-03-25 LAB — BASIC METABOLIC PANEL
Anion gap: 9 (ref 5–15)
BUN: 18 mg/dL (ref 8–23)
CO2: 27 mmol/L (ref 22–32)
Calcium: 9.1 mg/dL (ref 8.9–10.3)
Chloride: 101 mmol/L (ref 98–111)
Creatinine, Ser: 0.76 mg/dL (ref 0.44–1.00)
GFR calc Af Amer: >60 mL/min (ref >60)
GFR calc non Af Amer: >60 mL/min (ref >60)
Glucose, Bld: 111 mg/dL — ABNORMAL HIGH (ref 70–99)
Potassium: 4.8 mmol/L (ref 3.5–5.1)
Sodium: 137 mmol/L (ref 135–145)

## 2020-03-25 LAB — PHOSPHORUS: Phosphorus: 4.2 mg/dL (ref 2.5–4.6)

## 2020-03-25 LAB — MAGNESIUM: Magnesium: 1.8 mg/dL (ref 1.7–2.4)

## 2020-03-29 DIAGNOSIS — Z940 Kidney transplant status: Principal | ICD-10-CM

## 2020-04-13 DIAGNOSIS — E1169 Type 2 diabetes mellitus with other specified complication: Principal | ICD-10-CM

## 2020-04-13 DIAGNOSIS — E669 Obesity, unspecified: Principal | ICD-10-CM

## 2020-04-13 MED ORDER — LISINOPRIL 10 MG TABLET
ORAL_TABLET | Freq: Every day | ORAL | 0 refills | 90 days | Status: CP
Start: 2020-04-13 — End: ?

## 2020-04-14 DIAGNOSIS — Z940 Kidney transplant status: Principal | ICD-10-CM

## 2020-04-14 MED ORDER — TACROLIMUS 0.5 MG CAPSULE, IMMEDIATE-RELEASE
ORAL_CAPSULE | 6 refills | 0 days | Status: CP
Start: 2020-04-14 — End: ?

## 2020-04-26 ENCOUNTER — Encounter: Admit: 2020-04-26 | Discharge: 2020-04-26 | Payer: MEDICARE | Attending: Nephrology | Primary: Nephrology

## 2020-04-26 ENCOUNTER — Other Ambulatory Visit: Payer: Self-pay

## 2020-04-26 ENCOUNTER — Other Ambulatory Visit
Admission: RE | Admit: 2020-04-26 | Discharge: 2020-04-26 | Disposition: A | Discharge status: Home / Self Care | Payer: Medicare Other | Attending: Nephrology | Admitting: Nephrology

## 2020-04-26 DIAGNOSIS — E559 Vitamin D deficiency, unspecified: Secondary | ICD-10-CM | POA: Diagnosis not present

## 2020-04-26 DIAGNOSIS — N189 Chronic kidney disease, unspecified: Secondary | ICD-10-CM | POA: Diagnosis not present

## 2020-04-26 DIAGNOSIS — E1129 Type 2 diabetes mellitus with other diabetic kidney complication: Secondary | ICD-10-CM | POA: Diagnosis not present

## 2020-04-26 DIAGNOSIS — D899 Disorder involving the immune mechanism, unspecified: Secondary | ICD-10-CM | POA: Insufficient documentation

## 2020-04-26 DIAGNOSIS — Z114 Encounter for screening for human immunodeficiency virus [HIV]: Secondary | ICD-10-CM | POA: Insufficient documentation

## 2020-04-26 DIAGNOSIS — Z79899 Other long term (current) drug therapy: Secondary | ICD-10-CM | POA: Diagnosis not present

## 2020-04-26 DIAGNOSIS — Z940 Kidney transplant status: Secondary | ICD-10-CM | POA: Insufficient documentation

## 2020-04-26 DIAGNOSIS — B259 Cytomegaloviral disease, unspecified: Secondary | ICD-10-CM | POA: Diagnosis not present

## 2020-04-26 DIAGNOSIS — N390 Urinary tract infection, site not specified: Secondary | ICD-10-CM | POA: Insufficient documentation

## 2020-04-26 DIAGNOSIS — E1122 Type 2 diabetes mellitus with diabetic chronic kidney disease: Secondary | ICD-10-CM | POA: Diagnosis not present

## 2020-04-26 DIAGNOSIS — Z789 Other specified health status: Secondary | ICD-10-CM | POA: Insufficient documentation

## 2020-04-26 DIAGNOSIS — D631 Anemia in chronic kidney disease: Secondary | ICD-10-CM | POA: Insufficient documentation

## 2020-04-26 DIAGNOSIS — Z9483 Pancreas transplant status: Secondary | ICD-10-CM | POA: Insufficient documentation

## 2020-04-26 LAB — CBC WITH DIFFERENTIAL/PLATELET
Abs Immature Granulocytes: 0.12 10*3/uL — ABNORMAL HIGH (ref 0.00–0.07)
Basophils Absolute: 0 10*3/uL (ref 0.0–0.1)
Basophils Relative: 0 %
Eosinophils Absolute: 0 10*3/uL (ref 0.0–0.5)
Eosinophils Relative: 0 %
HCT: 43.8 % (ref 36.0–46.0)
Hemoglobin: 14.7 g/dL (ref 12.0–15.0)
Immature Granulocytes: 1 %
Lymphocytes Relative: 21 %
Lymphs Abs: 2.3 10*3/uL (ref 0.7–4.0)
MCH: 29.2 pg (ref 26.0–34.0)
MCHC: 33.6 g/dL (ref 30.0–36.0)
MCV: 87.1 fL (ref 80.0–100.0)
Monocytes Absolute: 1.1 10*3/uL — ABNORMAL HIGH (ref 0.1–1.0)
Monocytes Relative: 10 %
Neutro Abs: 7.4 10*3/uL (ref 1.7–7.7)
Neutrophils Relative %: 68 %
Platelets: 622 10*3/uL — ABNORMAL HIGH (ref 150–400)
RBC: 5.03 MIL/uL (ref 3.87–5.11)
RDW: 15.2 % (ref 11.5–15.5)
WBC: 10.9 10*3/uL — ABNORMAL HIGH (ref 4.0–10.5)
nRBC: 0 % (ref 0.0–0.2)

## 2020-04-26 LAB — BASIC METABOLIC PANEL
Anion gap: 8 (ref 5–15)
BUN: 29 mg/dL — ABNORMAL HIGH (ref 8–23)
CO2: 27 mmol/L (ref 22–32)
Calcium: 9 mg/dL (ref 8.9–10.3)
Chloride: 100 mmol/L (ref 98–111)
Creatinine, Ser: 0.81 mg/dL (ref 0.44–1.00)
GFR calc Af Amer: >60 mL/min (ref >60)
GFR calc non Af Amer: >60 mL/min (ref >60)
Glucose, Bld: 126 mg/dL — ABNORMAL HIGH (ref 70–99)
Potassium: 4.3 mmol/L (ref 3.5–5.1)
Sodium: 135 mmol/L (ref 135–145)

## 2020-04-26 LAB — PHOSPHORUS: Phosphorus: 3.6 mg/dL (ref 2.5–4.6)

## 2020-04-26 LAB — MAGNESIUM: Magnesium: 2 mg/dL (ref 1.7–2.4)

## 2020-04-28 DIAGNOSIS — Z940 Kidney transplant status: Principal | ICD-10-CM

## 2020-05-04 ENCOUNTER — Ambulatory Visit: Admit: 2020-05-04 | Discharge: 2020-05-05 | Payer: MEDICARE

## 2020-05-04 DIAGNOSIS — E1169 Type 2 diabetes mellitus with other specified complication: Secondary | ICD-10-CM

## 2020-05-04 DIAGNOSIS — E669 Obesity, unspecified: Secondary | ICD-10-CM

## 2020-05-04 DIAGNOSIS — Z940 Kidney transplant status: Principal | ICD-10-CM

## 2020-05-04 DIAGNOSIS — D751 Secondary polycythemia: Principal | ICD-10-CM

## 2020-05-04 DIAGNOSIS — E038 Other specified hypothyroidism: Principal | ICD-10-CM

## 2020-05-18 MED ORDER — ATORVASTATIN 20 MG TABLET
ORAL_TABLET | 11 refills | 0 days | Status: CP
Start: 2020-05-18 — End: ?

## 2020-05-18 MED ORDER — PREDNISONE 5 MG TABLET
ORAL_TABLET | 11 refills | 0 days | Status: CP
Start: 2020-05-18 — End: ?

## 2020-05-26 ENCOUNTER — Encounter: Admit: 2020-05-26 | Discharge: 2020-05-26 | Payer: MEDICARE | Attending: Nephrology | Primary: Nephrology

## 2020-05-26 ENCOUNTER — Other Ambulatory Visit: Payer: Self-pay

## 2020-05-26 ENCOUNTER — Other Ambulatory Visit
Admission: RE | Admit: 2020-05-26 | Discharge: 2020-05-26 | Disposition: A | Discharge status: Home / Self Care | Payer: Medicare Other | Attending: Nephrology | Admitting: Nephrology

## 2020-05-26 DIAGNOSIS — D899 Disorder involving the immune mechanism, unspecified: Secondary | ICD-10-CM | POA: Diagnosis not present

## 2020-05-26 DIAGNOSIS — Z79899 Other long term (current) drug therapy: Secondary | ICD-10-CM | POA: Insufficient documentation

## 2020-05-26 DIAGNOSIS — D631 Anemia in chronic kidney disease: Secondary | ICD-10-CM | POA: Diagnosis not present

## 2020-05-26 DIAGNOSIS — N390 Urinary tract infection, site not specified: Secondary | ICD-10-CM | POA: Diagnosis not present

## 2020-05-26 DIAGNOSIS — Z114 Encounter for screening for human immunodeficiency virus [HIV]: Secondary | ICD-10-CM | POA: Diagnosis not present

## 2020-05-26 DIAGNOSIS — E1129 Type 2 diabetes mellitus with other diabetic kidney complication: Secondary | ICD-10-CM | POA: Diagnosis not present

## 2020-05-26 DIAGNOSIS — Z789 Other specified health status: Secondary | ICD-10-CM | POA: Insufficient documentation

## 2020-05-26 DIAGNOSIS — B259 Cytomegaloviral disease, unspecified: Secondary | ICD-10-CM | POA: Diagnosis present

## 2020-05-26 DIAGNOSIS — E559 Vitamin D deficiency, unspecified: Secondary | ICD-10-CM | POA: Insufficient documentation

## 2020-05-26 DIAGNOSIS — Z9483 Pancreas transplant status: Secondary | ICD-10-CM | POA: Insufficient documentation

## 2020-05-26 DIAGNOSIS — Z09 Encounter for follow-up examination after completed treatment for conditions other than malignant neoplasm: Secondary | ICD-10-CM | POA: Diagnosis not present

## 2020-05-26 DIAGNOSIS — Z940 Kidney transplant status: Secondary | ICD-10-CM | POA: Insufficient documentation

## 2020-05-26 LAB — CBC WITH DIFFERENTIAL/PLATELET
Abs Immature Granulocytes: 0.07 10*3/uL (ref 0.00–0.07)
Basophils Absolute: 0 10*3/uL (ref 0.0–0.1)
Basophils Relative: 0 %
Eosinophils Absolute: 0.1 10*3/uL (ref 0.0–0.5)
Eosinophils Relative: 2 %
HCT: 43.8 % (ref 36.0–46.0)
Hemoglobin: 14.4 g/dL (ref 12.0–15.0)
Immature Granulocytes: 1 %
Lymphocytes Relative: 27 %
Lymphs Abs: 2 10*3/uL (ref 0.7–4.0)
MCH: 28.8 pg (ref 26.0–34.0)
MCHC: 32.9 g/dL (ref 30.0–36.0)
MCV: 87.6 fL (ref 80.0–100.0)
Monocytes Absolute: 1 10*3/uL (ref 0.1–1.0)
Monocytes Relative: 14 %
Neutro Abs: 4.2 10*3/uL (ref 1.7–7.7)
Neutrophils Relative %: 56 %
Platelets: 539 10*3/uL — ABNORMAL HIGH (ref 150–400)
RBC: 5 MIL/uL (ref 3.87–5.11)
RDW: 15.3 % (ref 11.5–15.5)
WBC: 7.4 10*3/uL (ref 4.0–10.5)
nRBC: 0 % (ref 0.0–0.2)

## 2020-05-26 LAB — BASIC METABOLIC PANEL
Anion gap: 8 (ref 5–15)
BUN: 12 mg/dL (ref 8–23)
CO2: 24 mmol/L (ref 22–32)
Calcium: 9 mg/dL (ref 8.9–10.3)
Chloride: 104 mmol/L (ref 98–111)
Creatinine, Ser: 0.83 mg/dL (ref 0.44–1.00)
GFR calc Af Amer: >60 mL/min (ref >60)
GFR calc non Af Amer: >60 mL/min (ref >60)
Glucose, Bld: 77 mg/dL (ref 70–99)
Potassium: 3.9 mmol/L (ref 3.5–5.1)
Sodium: 136 mmol/L (ref 135–145)

## 2020-05-26 LAB — PHOSPHORUS: Phosphorus: 3.4 mg/dL (ref 2.5–4.6)

## 2020-05-26 LAB — MAGNESIUM: Magnesium: 1.7 mg/dL (ref 1.7–2.4)

## 2020-05-31 DIAGNOSIS — Z940 Kidney transplant status: Principal | ICD-10-CM

## 2020-06-10 DIAGNOSIS — M4716 Other spondylosis with myelopathy, lumbar region: Secondary | ICD-10-CM | POA: Insufficient documentation

## 2020-06-25 ENCOUNTER — Encounter: Admit: 2020-06-25 | Discharge: 2020-06-25 | Payer: MEDICARE | Attending: Nephrology | Primary: Nephrology

## 2020-06-25 ENCOUNTER — Other Ambulatory Visit: Payer: Self-pay

## 2020-06-25 ENCOUNTER — Ambulatory Visit
Admission: EM | Admit: 2020-06-25 | Discharge: 2020-06-25 | Disposition: A | Discharge status: Home / Self Care | Payer: Medicare Other | Attending: Family | Admitting: Family

## 2020-06-25 ENCOUNTER — Other Ambulatory Visit
Admission: RE | Admit: 2020-06-25 | Discharge: 2020-06-25 | Disposition: A | Discharge status: Home / Self Care | Payer: Medicare Other | Attending: Nephrology | Admitting: Nephrology

## 2020-06-25 DIAGNOSIS — D899 Disorder involving the immune mechanism, unspecified: Secondary | ICD-10-CM | POA: Insufficient documentation

## 2020-06-25 DIAGNOSIS — Z79899 Other long term (current) drug therapy: Secondary | ICD-10-CM | POA: Insufficient documentation

## 2020-06-25 DIAGNOSIS — Z09 Encounter for follow-up examination after completed treatment for conditions other than malignant neoplasm: Secondary | ICD-10-CM | POA: Insufficient documentation

## 2020-06-25 DIAGNOSIS — T8610 Unspecified complication of kidney transplant: Secondary | ICD-10-CM | POA: Insufficient documentation

## 2020-06-25 DIAGNOSIS — Z23 Encounter for immunization: Secondary | ICD-10-CM

## 2020-06-25 DIAGNOSIS — D631 Anemia in chronic kidney disease: Secondary | ICD-10-CM | POA: Diagnosis not present

## 2020-06-25 DIAGNOSIS — Z9189 Other specified personal risk factors, not elsewhere classified: Secondary | ICD-10-CM

## 2020-06-25 DIAGNOSIS — N390 Urinary tract infection, site not specified: Secondary | ICD-10-CM | POA: Diagnosis not present

## 2020-06-25 DIAGNOSIS — B259 Cytomegaloviral disease, unspecified: Secondary | ICD-10-CM | POA: Diagnosis not present

## 2020-06-25 DIAGNOSIS — Z789 Other specified health status: Secondary | ICD-10-CM | POA: Diagnosis not present

## 2020-06-25 DIAGNOSIS — S40811A Abrasion of right upper arm, initial encounter: Secondary | ICD-10-CM | POA: Diagnosis not present

## 2020-06-25 DIAGNOSIS — E559 Vitamin D deficiency, unspecified: Secondary | ICD-10-CM | POA: Insufficient documentation

## 2020-06-25 DIAGNOSIS — Z114 Encounter for screening for human immunodeficiency virus [HIV]: Secondary | ICD-10-CM | POA: Insufficient documentation

## 2020-06-25 DIAGNOSIS — R609 Edema, unspecified: Secondary | ICD-10-CM | POA: Diagnosis not present

## 2020-06-25 DIAGNOSIS — Z9483 Pancreas transplant status: Secondary | ICD-10-CM | POA: Diagnosis not present

## 2020-06-25 DIAGNOSIS — E1129 Type 2 diabetes mellitus with other diabetic kidney complication: Secondary | ICD-10-CM | POA: Diagnosis not present

## 2020-06-25 LAB — BASIC METABOLIC PANEL
Anion gap: 9 (ref 5–15)
BUN: 18 mg/dL (ref 8–23)
CO2: 24 mmol/L (ref 22–32)
Calcium: 9.1 mg/dL (ref 8.9–10.3)
Chloride: 102 mmol/L (ref 98–111)
Creatinine, Ser: 0.83 mg/dL (ref 0.44–1.00)
GFR calc Af Amer: >60 mL/min (ref >60)
GFR calc non Af Amer: >60 mL/min (ref >60)
Glucose, Bld: 131 mg/dL — ABNORMAL HIGH (ref 70–99)
Potassium: 4.2 mmol/L (ref 3.5–5.1)
Sodium: 135 mmol/L (ref 135–145)

## 2020-06-25 LAB — CBC WITH DIFFERENTIAL/PLATELET
Abs Immature Granulocytes: 0.05 10*3/uL (ref 0.00–0.07)
Basophils Absolute: 0 10*3/uL (ref 0.0–0.1)
Basophils Relative: 0 %
Eosinophils Absolute: 0.2 10*3/uL (ref 0.0–0.5)
Eosinophils Relative: 2 %
HCT: 42.9 % (ref 36.0–46.0)
Hemoglobin: 14.2 g/dL (ref 12.0–15.0)
Immature Granulocytes: 1 %
Lymphocytes Relative: 26 %
Lymphs Abs: 2.3 10*3/uL (ref 0.7–4.0)
MCH: 28.7 pg (ref 26.0–34.0)
MCHC: 33.1 g/dL (ref 30.0–36.0)
MCV: 86.7 fL (ref 80.0–100.0)
Monocytes Absolute: 0.9 10*3/uL (ref 0.1–1.0)
Monocytes Relative: 10 %
Neutro Abs: 5.4 10*3/uL (ref 1.7–7.7)
Neutrophils Relative %: 61 %
Platelets: 473 10*3/uL — ABNORMAL HIGH (ref 150–400)
RBC: 4.95 MIL/uL (ref 3.87–5.11)
RDW: 15.3 % (ref 11.5–15.5)
WBC: 8.7 10*3/uL (ref 4.0–10.5)
nRBC: 0 % (ref 0.0–0.2)

## 2020-06-25 LAB — MAGNESIUM: Magnesium: 1.7 mg/dL (ref 1.7–2.4)

## 2020-06-25 LAB — PHOSPHORUS: Phosphorus: 3.8 mg/dL (ref 2.5–4.6)

## 2020-06-25 MED ORDER — TETANUS-DIPHTHERIA TOXOIDS TD 5-2 LFU IM INJ
0.5000 mL | INJECTION | Freq: Once | INTRAMUSCULAR | Status: AC
  Administered 2020-06-25: 0.5 mL via INTRAMUSCULAR

## 2020-06-25 MED ORDER — AZITHROMYCIN 250 MG PO TABS: 250.0000 mg | ORAL_TABLET | Freq: Every day | ORAL | 0 refills | Status: DC

## 2020-06-25 MED ORDER — TORSEMIDE 10 MG PO TABS: 10.0000 mg | ORAL_TABLET | Freq: Every day | ORAL | 0 refills | Status: DC

## 2020-06-25 NOTE — ED Triage Notes (Signed)
Pt reports cutting her R arm on a piece of wood on the porch on Wednesday. Reports bleeding was controlled after cleaning and putting a bandage on wound. Notice the wound was weeping yesterday and this morning.

## 2020-06-25 NOTE — ED Provider Notes (Signed)
[facility]    CSN: [id number] Arrival date & time: 06/25/20  0851      History   Chief Complaint Chief Complaint  Patient presents with  . Abrasion    HPI Cindy Fischer is a 74 y.o. female.   74 year old female presents with injury to her right mid forearm. She was walking up to her house 2 days ago when she scraped her right arm on the wooden railing and post. She experienced some bleeding but minimal pain. Yesterday she noticed the area was weeping clear to yellowish fluid. Some redness but concerned that wound keeps draining and soaking through gauze/bandaids. She has a history of Lymphedema only on the right side of her body- arms and legs which seems to make the drainage worse. She has had this for at least 5 years after her kidney transplant in 2015. Other chronic health issues include type 2 DM, HTN, hypothyroidism,hyperlipidemia and skin cancer. Currently on Humulin and Novolin insulin, Lisinopril, Synthroid, Lipitor, aspirin, Prilosec, low dose Prednisone, Prograf and Everolimus (for renal transplant).   The history is provided by the patient.    Past Medical History:  Diagnosis Date  . Cancer ([place]) 2016   skin (nose)  . Diabetes mellitus without complication ([place])   . Hypothyroidism   . Pneumonia   . Renal disorder     Patient Active Problem List   Diagnosis Date Noted  . Lymphedema 03/22/2020  . GERD (gastroesophageal reflux disease) 02/09/2019  . Leg pain 08/17/2018  . Atherosclerosis of native arteries of extremity with intermittent claudication ([place]) 08/17/2018  . Renal transplant, status post 08/17/2018  . Diabetes ([place]) 08/17/2018  . Hyperlipidemia 08/17/2018  . Numbness and tingling 07/16/2018  . Tingling 07/16/2018  . Erythrocytosis 09/24/2015  . Thrombocytosis ([place]) 09/24/2015  . Breast swelling 08/26/2015  . Immunosuppression ([place]) 07/22/2015  . History of nonmelanoma skin cancer 05/31/2015  . Aftercare following organ transplant  11/26/2014  . Hypothyroidism 11/26/2014  . Immunocompromised state ([place]) 11/26/2014  . Quit smoking within past year 11/26/2014  . Proliferative diabetic retinopathy ([place]) 04/10/2011    Past Surgical History:  Procedure Laterality Date  . CATARACT EXTRACTION, BILATERAL    . COLONOSCOPY  12/02/2010  . COLONOSCOPY WITH PROPOFOL N/A 09/04/2016   Procedure: COLONOSCOPY WITH PROPOFOL;  Surgeon: Christene Lye, MD;  Location: [facility] END[facility]COPY;  Service: Endoscopy;  Laterality: N/A;  . EYE SURGERY    . INSERTION OF DIALYSIS CATHETER  2014   removed 2014 (only had in for about 8 weeks)  . laser surgery on eyes  1999  . LOWER EXTREMITY ANGIOGRAPHY Right 10/16/2018   Procedure: LOWER EXTREMITY ANGIOGRAPHY;  Surgeon: Katha Cabal, MD;  Location: [facility];  Service: Cardiovascular;  Laterality: Right;  . 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 TONSILLECTOMY    . TUBAL LIGATION      OB History   No obstetric history on file.      Home Medications    Prior to Admission medications   Medication Sig Start Date End Date Taking? Authorizing Provider  aspirin EC 81 MG tablet Take 81 mg by mouth daily.    [provider]  atorvastatin (LIPITOR) 20 MG tablet Take 20 mg by mouth daily.    [provider]  azithromycin (ZITHROMAX) 250 MG tablet Take 1 tablet (250 mg total) by mouth daily. Take first 2 tablets together, then 1 every day until finished. 06/25/20   Katy Apo, NP  Cholecalciferol (  VITAMIN D) 2000 units tablet Take 2,000 Units by mouth daily.    [provider]  clopidogrel (PLAVIX) 75 MG tablet TAKE 1 TABLET BY MOUTH EVERY DAY Patient not taking: TAKE 1 TABLET BY MOUTH EVERY DAY 04/08/19   Schnier, Dolores Lory, MD  diclofenac Sodium (VOLTAREN) 1 % GEL Apply 2 g topically 4 (four) times daily. 11/10/19   [provider]  Everolimus 0.25 MG TABS Take 0.5 mg by mouth 2 (two) times daily.    [provider]    insulin NPH Human (HUMULIN N,NOVOLIN N) 100 UNIT/ML injection Inject 30 Units into the skin daily.    [provider]  insulin regular (NOVOLIN R,HUMULIN R) 100 units/mL injection Inject 3-8 Units into the skin 3 (three) times daily before meals.    [provider]  levothyroxine (SYNTHROID, LEVOTHROID) 100 MCG tablet Take 100 mcg by mouth daily before breakfast.    [provider]  lisinopril (PRINIVIL,ZESTRIL) 10 MG tablet Take 10 mg by mouth daily.    [provider]  omeprazole (PRILOSEC) 40 MG capsule Take 40 mg by mouth every other day.  06/26/18   [provider]  predniSONE (DELTASONE) 5 MG tablet Take 5 mg by mouth daily.  05/08/18   [provider]  tacrolimus (PROGRAF) 0.5 MG capsule Take 1-1.5 mg by mouth See admin instructions. Take 1.5 mg in the morning and 1 mg in the evening    [provider]  torsemide (DEMADEX) 10 MG tablet Take 1 tablet (10 mg total) by mouth daily. 06/25/20   Katy Apo, NP    Family History Family History  Problem Relation Age of Onset  . Multiple myeloma Mother   . Diabetes Father   . Heart attack Father   . Breast cancer Neg Hx     Social History Social History   Tobacco Use  . Smoking status: Former Smoker    Types: Cigarettes  . Smokeless tobacco: Never Used  Vaping Use  . Vaping Use: Never used  Substance Use Topics  . Alcohol use: No  . Drug use: No     Allergies   Oxycodone-acetaminophen and Penicillins   Review of Systems Review of Systems  Constitutional: Negative for activity change, appetite change, chills and fever.  Respiratory: Negative for shortness of breath and wheezing.   Cardiovascular: Positive for leg swelling.  Musculoskeletal: Positive for myalgias.  Skin: Positive for color change and wound.  Allergic/Immunologic: Positive for immunocompromised state. Negative for environmental allergies and food allergies.  Neurological: Positive for dizziness  and light-headedness. Negative for seizures, syncope, facial asymmetry and weakness.  Hematological: Negative for adenopathy. Bruises/bleeds easily.     Physical Exam Triage Vital Signs ED Triage Vitals  Enc Vitals Group     BP 06/25/20 0902 (!) 164/82     Pulse Rate 06/25/20 0902 80     Resp 06/25/20 0902 16     Temp 06/25/20 0902 98.3 F (36.8 C)     Temp Source 06/25/20 0902 Oral     SpO2 06/25/20 0902 100 %     Weight 06/25/20 0903 170 lb (77.1 kg)     Height 06/25/20 0903 _0  (1.6 m)     Head Circumference --      Peak Flow --      Pain Score 06/25/20 0903 0     Pain Loc --      Pain Edu? --      Excl. in [place]? --  No data found.  Updated Vital Signs BP (!) 164/82   Pulse 80   Temp 98.3 F (36.8 C) (Oral)   Resp 16   Ht _0  (1.6 m)   Wt 170 lb (77.1 kg)   SpO2 100%   BMI 30.11 kg/m   Visual Acuity Right Eye Distance:   Left Eye Distance:   Bilateral Distance:    Right Eye Near:   Left Eye Near:    Bilateral Near:     Physical Exam Vitals and nursing note reviewed.  Constitutional:      General: She is awake. She is not in acute distress.    Appearance: She is well-developed and well-groomed.     Comments: She is sitting comfortably on the exam table in no acute distress but holding gauze to her forearm due to drainage.   HENT:     Head: Normocephalic and atraumatic.  Cardiovascular:     Rate and Rhythm: Normal rate.  Pulmonary:     Effort: Pulmonary effort is normal.  Musculoskeletal:        General: Swelling present. Normal range of motion.     Right forearm: Edema and laceration present.     Left forearm: Normal.       Arms:     Cervical back: Normal range of motion.     Comments: Small 12 mm abrasion present on right mid forearm outer area. No bloody drainage but clear to yellowish fluid present and oozing from area. Slight redness and minimal tenderness. No surrounding erythema. Good distal pulses and capillary refill. Has full range of  motion of arm and hands.   Skin:    General: Skin is warm and dry.     Capillary Refill: Capillary refill takes less than 2 seconds.     Findings: Abrasion and ecchymosis present. No rash.     Comments: Entire right side of body including face with swelling- most prominent in arm and legs. Both arms with multiple areas of healing of bruising and left arm with previous antecubital fistula.   Neurological:     General: No focal deficit present.     Mental Status: She is alert and oriented to person, place, and time.     Sensory: Sensation is intact. No sensory deficit.     Motor: Motor function is intact.  Psychiatric:        Mood and Affect: Mood normal.        Behavior: Behavior normal. Behavior is cooperative.        Thought Content: Thought content normal.        Judgment: Judgment normal.      UC Treatments / Results  Labs (all labs ordered are listed, but only abnormal results are displayed) Labs Reviewed - No data to display  EKG   Radiology No results found.  Procedures Procedures (including critical care time)  Medications Ordered in UC Medications  tetanus & diphtheria toxoids (adult) (TENIVAC) injection 0.5 mL (0.5 mLs Intramuscular Given 06/25/20 0957)    Initial Impression / Assessment and Plan / UC Course  I have reviewed the triage vital signs and the nursing notes.  Pertinent labs & imaging results that were available during my care of the patient were reviewed by me and considered in my medical decision making (see chart for details).     Reviewed with patient that she has a small abrasion that does not appear to be infected yet but due to immunocompromised state and risk for infection, will  start on Zithromax as directed (patient has taken and tolerated this medication before).  Td- Tetanus booster given.  Discussed that due to chronic edema and location of injury that she may continue to see drainage and oozing of fluid from wound. Applied triple  antibiotic ointment, applied gauze and Tegaderm dressing and covered with [place]. Discussed that she will probably need to change the dressing multiple times in a day. Try to keep as clean and dry as possible. Discussed that she may trial a Loop Diuretic for edema- patient had routine lab work this morning and discussed that she should be able to tolerate a Diuretic as long as Potassium level is not low. May start Demadex 39m daily in AM. Recommend talk with PCP and Nephrologist about continuing medication, especially if helpful. Follow-up with her PCP in 3 to 4 days for recheck.  Final Clinical Impressions(s) / UC Diagnoses   Final diagnoses:  Abrasion of arm, right, initial encounter  Fluid retention  At risk for infection due to immunosuppression     Discharge Instructions     You were given a Tetanus booster today. Recommend start Zithromax as directed to prevent skin infection. May trial Demadex 193mdaily to help reduce fluid in tissues. Continue to change bandages frequently. Follow-up with your PCP in 3 to 4 days for recheck if needed.     ED Prescriptions    Medication Sig Dispense Auth. Provider   azithromycin (ZITHROMAX) 250 MG tablet Take 1 tablet (250 mg total) by mouth daily. Take first 2 tablets together, then 1 every day until finished. 6 tablet AmKaty ApoNP   torsemide (DEMADEX) 10 MG tablet Take 1 tablet (10 mg total) by mouth daily. 15 tablet Tareva Leske, AnNicholes StairsNP     PDMP not reviewed this encounter.   AmKaty ApoNP 06/27/20 [id number]

## 2020-06-25 NOTE — Discharge Instructions (Addendum)
You were given a Tetanus booster today. Recommend start Zithromax as directed to prevent skin infection. May trial Demadex 10mg  daily to help reduce fluid in tissues. Continue to change bandages frequently. Follow-up with your PCP in 3 to 4 days for recheck if needed.

## 2020-06-29 DIAGNOSIS — Z940 Kidney transplant status: Principal | ICD-10-CM

## 2020-07-04 DIAGNOSIS — E1169 Type 2 diabetes mellitus with other specified complication: Principal | ICD-10-CM

## 2020-07-04 DIAGNOSIS — E669 Obesity, unspecified: Principal | ICD-10-CM

## 2020-07-06 ENCOUNTER — Ambulatory Visit: Admit: 2020-07-06 | Discharge: 2020-07-07 | Payer: MEDICARE

## 2020-07-06 DIAGNOSIS — Z85828 Personal history of other malignant neoplasm of skin: Principal | ICD-10-CM

## 2020-07-06 DIAGNOSIS — Z940 Kidney transplant status: Principal | ICD-10-CM

## 2020-07-06 DIAGNOSIS — L821 Other seborrheic keratosis: Principal | ICD-10-CM

## 2020-07-06 DIAGNOSIS — L918 Other hypertrophic disorders of the skin: Principal | ICD-10-CM

## 2020-07-26 ENCOUNTER — Encounter: Admit: 2020-07-26 | Discharge: 2020-07-26 | Payer: MEDICARE | Attending: Nephrology | Primary: Nephrology

## 2020-07-26 ENCOUNTER — Other Ambulatory Visit
Admission: RE | Admit: 2020-07-26 | Discharge: 2020-07-26 | Disposition: A | Discharge status: Home / Self Care | Payer: Medicare Other | Attending: Nephrology | Admitting: Nephrology

## 2020-07-26 DIAGNOSIS — B259 Cytomegaloviral disease, unspecified: Secondary | ICD-10-CM | POA: Diagnosis present

## 2020-07-26 DIAGNOSIS — Z789 Other specified health status: Secondary | ICD-10-CM | POA: Insufficient documentation

## 2020-07-26 DIAGNOSIS — E559 Vitamin D deficiency, unspecified: Secondary | ICD-10-CM | POA: Insufficient documentation

## 2020-07-26 DIAGNOSIS — Z114 Encounter for screening for human immunodeficiency virus [HIV]: Secondary | ICD-10-CM | POA: Diagnosis not present

## 2020-07-26 DIAGNOSIS — Z9483 Pancreas transplant status: Secondary | ICD-10-CM | POA: Insufficient documentation

## 2020-07-26 DIAGNOSIS — Z940 Kidney transplant status: Secondary | ICD-10-CM | POA: Insufficient documentation

## 2020-07-26 DIAGNOSIS — E1129 Type 2 diabetes mellitus with other diabetic kidney complication: Secondary | ICD-10-CM | POA: Diagnosis not present

## 2020-07-26 DIAGNOSIS — Z79899 Other long term (current) drug therapy: Secondary | ICD-10-CM | POA: Diagnosis not present

## 2020-07-26 DIAGNOSIS — D631 Anemia in chronic kidney disease: Secondary | ICD-10-CM | POA: Diagnosis not present

## 2020-07-26 DIAGNOSIS — D899 Disorder involving the immune mechanism, unspecified: Secondary | ICD-10-CM | POA: Diagnosis not present

## 2020-07-26 DIAGNOSIS — Z09 Encounter for follow-up examination after completed treatment for conditions other than malignant neoplasm: Secondary | ICD-10-CM | POA: Diagnosis not present

## 2020-07-26 DIAGNOSIS — N390 Urinary tract infection, site not specified: Secondary | ICD-10-CM | POA: Insufficient documentation

## 2020-07-26 LAB — BASIC METABOLIC PANEL
Anion gap: 7 (ref 5–15)
BUN: 22 mg/dL (ref 8–23)
CO2: 29 mmol/L (ref 22–32)
Calcium: 9.1 mg/dL (ref 8.9–10.3)
Chloride: 97 mmol/L — ABNORMAL LOW (ref 98–111)
Creatinine, Ser: 1.06 mg/dL — ABNORMAL HIGH (ref 0.44–1.00)
GFR calc Af Amer: 60 mL/min — ABNORMAL LOW (ref >60)
GFR calc non Af Amer: 52 mL/min — ABNORMAL LOW (ref >60)
Glucose, Bld: 107 mg/dL — ABNORMAL HIGH (ref 70–99)
Potassium: 4.8 mmol/L (ref 3.5–5.1)
Sodium: 133 mmol/L — ABNORMAL LOW (ref 135–145)

## 2020-07-26 LAB — PHOSPHORUS: Phosphorus: 3.6 mg/dL (ref 2.5–4.6)

## 2020-07-26 LAB — CBC WITH DIFFERENTIAL/PLATELET
Abs Immature Granulocytes: 0.06 10*3/uL (ref 0.00–0.07)
Basophils Absolute: 0 10*3/uL (ref 0.0–0.1)
Basophils Relative: 0 %
Eosinophils Absolute: 0.1 10*3/uL (ref 0.0–0.5)
Eosinophils Relative: 1 %
HCT: 41.6 % (ref 36.0–46.0)
Hemoglobin: 14.2 g/dL (ref 12.0–15.0)
Immature Granulocytes: 1 %
Lymphocytes Relative: 27 %
Lymphs Abs: 2.4 10*3/uL (ref 0.7–4.0)
MCH: 29.6 pg (ref 26.0–34.0)
MCHC: 34.1 g/dL (ref 30.0–36.0)
MCV: 86.8 fL (ref 80.0–100.0)
Monocytes Absolute: 1.2 10*3/uL — ABNORMAL HIGH (ref 0.1–1.0)
Monocytes Relative: 14 %
Neutro Abs: 4.9 10*3/uL (ref 1.7–7.7)
Neutrophils Relative %: 57 %
Platelets: 497 10*3/uL — ABNORMAL HIGH (ref 150–400)
RBC: 4.79 MIL/uL (ref 3.87–5.11)
RDW: 14.4 % (ref 11.5–15.5)
WBC: 8.7 10*3/uL (ref 4.0–10.5)
nRBC: 0 % (ref 0.0–0.2)

## 2020-07-26 LAB — MAGNESIUM: Magnesium: 1.8 mg/dL (ref 1.7–2.4)

## 2020-07-27 DIAGNOSIS — Z940 Kidney transplant status: Principal | ICD-10-CM

## 2020-07-29 DIAGNOSIS — E669 Obesity, unspecified: Principal | ICD-10-CM

## 2020-07-29 DIAGNOSIS — E1169 Type 2 diabetes mellitus with other specified complication: Principal | ICD-10-CM

## 2020-07-29 MED ORDER — LISINOPRIL 10 MG TABLET
ORAL_TABLET | Freq: Every day | ORAL | 4 refills | 90 days | Status: CP
Start: 2020-07-29 — End: ?

## 2020-08-26 ENCOUNTER — Encounter: Admit: 2020-08-26 | Discharge: 2020-08-26 | Payer: MEDICARE | Attending: Nephrology | Primary: Nephrology

## 2020-08-26 ENCOUNTER — Other Ambulatory Visit
Admission: RE | Admit: 2020-08-26 | Discharge: 2020-08-26 | Disposition: A | Discharge status: Home / Self Care | Payer: Medicare Other | Attending: Nephrology | Admitting: Nephrology

## 2020-08-26 ENCOUNTER — Other Ambulatory Visit: Payer: Self-pay

## 2020-08-26 DIAGNOSIS — N390 Urinary tract infection, site not specified: Secondary | ICD-10-CM | POA: Diagnosis not present

## 2020-08-26 DIAGNOSIS — D899 Disorder involving the immune mechanism, unspecified: Secondary | ICD-10-CM | POA: Insufficient documentation

## 2020-08-26 DIAGNOSIS — D631 Anemia in chronic kidney disease: Secondary | ICD-10-CM | POA: Insufficient documentation

## 2020-08-26 DIAGNOSIS — E1129 Type 2 diabetes mellitus with other diabetic kidney complication: Secondary | ICD-10-CM | POA: Insufficient documentation

## 2020-08-26 DIAGNOSIS — E559 Vitamin D deficiency, unspecified: Secondary | ICD-10-CM | POA: Insufficient documentation

## 2020-08-26 DIAGNOSIS — B259 Cytomegaloviral disease, unspecified: Secondary | ICD-10-CM | POA: Diagnosis not present

## 2020-08-26 DIAGNOSIS — Z114 Encounter for screening for human immunodeficiency virus [HIV]: Secondary | ICD-10-CM | POA: Diagnosis not present

## 2020-08-26 DIAGNOSIS — Z9483 Pancreas transplant status: Secondary | ICD-10-CM | POA: Diagnosis not present

## 2020-08-26 DIAGNOSIS — Z79899 Other long term (current) drug therapy: Secondary | ICD-10-CM | POA: Diagnosis not present

## 2020-08-26 DIAGNOSIS — Z09 Encounter for follow-up examination after completed treatment for conditions other than malignant neoplasm: Secondary | ICD-10-CM | POA: Diagnosis not present

## 2020-08-26 DIAGNOSIS — T8610 Unspecified complication of kidney transplant: Secondary | ICD-10-CM | POA: Insufficient documentation

## 2020-08-26 DIAGNOSIS — Z789 Other specified health status: Secondary | ICD-10-CM | POA: Insufficient documentation

## 2020-08-26 LAB — CBC WITH DIFFERENTIAL/PLATELET
Abs Immature Granulocytes: 0.15 10*3/uL — ABNORMAL HIGH (ref 0.00–0.07)
Basophils Absolute: 0 10*3/uL (ref 0.0–0.1)
Basophils Relative: 0 %
Eosinophils Absolute: 0.2 10*3/uL (ref 0.0–0.5)
Eosinophils Relative: 2 %
HCT: 42.6 % (ref 36.0–46.0)
Hemoglobin: 14.2 g/dL (ref 12.0–15.0)
Immature Granulocytes: 2 %
Lymphocytes Relative: 24 %
Lymphs Abs: 2.3 10*3/uL (ref 0.7–4.0)
MCH: 29.3 pg (ref 26.0–34.0)
MCHC: 33.3 g/dL (ref 30.0–36.0)
MCV: 87.8 fL (ref 80.0–100.0)
Monocytes Absolute: 1.3 10*3/uL — ABNORMAL HIGH (ref 0.1–1.0)
Monocytes Relative: 14 %
Neutro Abs: 5.6 10*3/uL (ref 1.7–7.7)
Neutrophils Relative %: 58 %
Platelets: 555 10*3/uL — ABNORMAL HIGH (ref 150–400)
RBC: 4.85 MIL/uL (ref 3.87–5.11)
RDW: 14.6 % (ref 11.5–15.5)
WBC: 9.5 10*3/uL (ref 4.0–10.5)
nRBC: 0 % (ref 0.0–0.2)

## 2020-08-26 LAB — BASIC METABOLIC PANEL
Anion gap: 9 (ref 5–15)
BUN: 22 mg/dL (ref 8–23)
CO2: 26 mmol/L (ref 22–32)
Calcium: 9 mg/dL (ref 8.9–10.3)
Chloride: 99 mmol/L (ref 98–111)
Creatinine, Ser: 1.05 mg/dL — ABNORMAL HIGH (ref 0.44–1.00)
GFR calc Af Amer: >60 mL/min (ref >60)
GFR calc non Af Amer: 52 mL/min — ABNORMAL LOW (ref >60)
Glucose, Bld: 98 mg/dL (ref 70–99)
Potassium: 4.1 mmol/L (ref 3.5–5.1)
Sodium: 134 mmol/L — ABNORMAL LOW (ref 135–145)

## 2020-08-26 LAB — PHOSPHORUS: Phosphorus: 3.7 mg/dL (ref 2.5–4.6)

## 2020-08-26 LAB — MAGNESIUM: Magnesium: 1.8 mg/dL (ref 1.7–2.4)

## 2020-09-01 DIAGNOSIS — Z940 Kidney transplant status: Principal | ICD-10-CM

## 2020-09-24 ENCOUNTER — Encounter: Admit: 2020-09-24 | Discharge: 2020-09-24 | Payer: MEDICARE | Attending: Nephrology | Primary: Nephrology

## 2020-09-24 ENCOUNTER — Other Ambulatory Visit
Admission: RE | Admit: 2020-09-24 | Discharge: 2020-09-24 | Disposition: A | Discharge status: Home / Self Care | Payer: Medicare Other | Attending: Nephrology | Admitting: Nephrology

## 2020-09-24 DIAGNOSIS — Z940 Kidney transplant status: Secondary | ICD-10-CM | POA: Diagnosis present

## 2020-09-24 LAB — CBC WITH DIFFERENTIAL/PLATELET
Abs Immature Granulocytes: 0.07 10*3/uL (ref 0.00–0.07)
Basophils Absolute: 0 10*3/uL (ref 0.0–0.1)
Basophils Relative: 0 %
Eosinophils Absolute: 0.1 10*3/uL (ref 0.0–0.5)
Eosinophils Relative: 1 %
HCT: 42.2 % (ref 36.0–46.0)
Hemoglobin: 14.1 g/dL (ref 12.0–15.0)
Immature Granulocytes: 1 %
Lymphocytes Relative: 26 %
Lymphs Abs: 2.1 10*3/uL (ref 0.7–4.0)
MCH: 29.1 pg (ref 26.0–34.0)
MCHC: 33.4 g/dL (ref 30.0–36.0)
MCV: 87 fL (ref 80.0–100.0)
Monocytes Absolute: 1 10*3/uL (ref 0.1–1.0)
Monocytes Relative: 13 %
Neutro Abs: 4.7 10*3/uL (ref 1.7–7.7)
Neutrophils Relative %: 59 %
Platelets: 553 10*3/uL — ABNORMAL HIGH (ref 150–400)
RBC: 4.85 MIL/uL (ref 3.87–5.11)
RDW: 14.7 % (ref 11.5–15.5)
WBC: 8 10*3/uL (ref 4.0–10.5)
nRBC: 0 % (ref 0.0–0.2)

## 2020-09-24 LAB — BASIC METABOLIC PANEL
Anion gap: 8 (ref 5–15)
BUN: 16 mg/dL (ref 8–23)
CO2: 29 mmol/L (ref 22–32)
Calcium: 9.1 mg/dL (ref 8.9–10.3)
Chloride: 97 mmol/L — ABNORMAL LOW (ref 98–111)
Creatinine, Ser: 0.93 mg/dL (ref 0.44–1.00)
GFR calc Af Amer: >60 mL/min (ref >60)
GFR calc non Af Amer: >60 mL/min (ref >60)
Glucose, Bld: 76 mg/dL (ref 70–99)
Potassium: 4.2 mmol/L (ref 3.5–5.1)
Sodium: 134 mmol/L — ABNORMAL LOW (ref 135–145)

## 2020-09-24 LAB — PHOSPHORUS: Phosphorus: 3.9 mg/dL (ref 2.5–4.6)

## 2020-09-24 LAB — MAGNESIUM: Magnesium: 1.8 mg/dL (ref 1.7–2.4)

## 2020-09-27 DIAGNOSIS — Z940 Kidney transplant status: Principal | ICD-10-CM

## 2020-10-08 DIAGNOSIS — Z940 Kidney transplant status: Principal | ICD-10-CM

## 2020-10-11 DIAGNOSIS — Z940 Kidney transplant status: Principal | ICD-10-CM

## 2020-10-11 MED ORDER — EVEROLIMUS (IMMUNOSUPPRESSIVE) 0.5 MG TABLET
ORAL_TABLET | Freq: Two times a day (BID) | ORAL | 11 refills | 30.00000 days | Status: CP
Start: 2020-10-11 — End: 2021-10-11

## 2020-10-25 ENCOUNTER — Encounter: Admit: 2020-10-25 | Discharge: 2020-10-25 | Payer: MEDICARE | Attending: Nephrology | Primary: Nephrology

## 2020-10-25 ENCOUNTER — Other Ambulatory Visit
Admission: RE | Admit: 2020-10-25 | Discharge: 2020-10-25 | Disposition: A | Discharge status: Home / Self Care | Payer: Medicare Other | Attending: Nephrology | Admitting: Nephrology

## 2020-10-25 DIAGNOSIS — N390 Urinary tract infection, site not specified: Secondary | ICD-10-CM | POA: Insufficient documentation

## 2020-10-25 DIAGNOSIS — Z79899 Other long term (current) drug therapy: Secondary | ICD-10-CM | POA: Diagnosis not present

## 2020-10-25 DIAGNOSIS — Z114 Encounter for screening for human immunodeficiency virus [HIV]: Secondary | ICD-10-CM | POA: Insufficient documentation

## 2020-10-25 DIAGNOSIS — D631 Anemia in chronic kidney disease: Secondary | ICD-10-CM | POA: Diagnosis not present

## 2020-10-25 DIAGNOSIS — D899 Disorder involving the immune mechanism, unspecified: Secondary | ICD-10-CM | POA: Insufficient documentation

## 2020-10-25 DIAGNOSIS — E1129 Type 2 diabetes mellitus with other diabetic kidney complication: Secondary | ICD-10-CM | POA: Insufficient documentation

## 2020-10-25 DIAGNOSIS — E559 Vitamin D deficiency, unspecified: Secondary | ICD-10-CM | POA: Diagnosis not present

## 2020-10-25 DIAGNOSIS — Z789 Other specified health status: Secondary | ICD-10-CM | POA: Insufficient documentation

## 2020-10-25 DIAGNOSIS — B259 Cytomegaloviral disease, unspecified: Secondary | ICD-10-CM | POA: Diagnosis not present

## 2020-10-25 DIAGNOSIS — Z940 Kidney transplant status: Secondary | ICD-10-CM | POA: Insufficient documentation

## 2020-10-25 DIAGNOSIS — Z9483 Pancreas transplant status: Secondary | ICD-10-CM | POA: Diagnosis not present

## 2020-10-25 LAB — CBC WITH DIFFERENTIAL/PLATELET
Abs Immature Granulocytes: 0.03 10*3/uL (ref 0.00–0.07)
Basophils Absolute: 0 10*3/uL (ref 0.0–0.1)
Basophils Relative: 0 %
Eosinophils Absolute: 0.1 10*3/uL (ref 0.0–0.5)
Eosinophils Relative: 2 %
HCT: 44.1 % (ref 36.0–46.0)
Hemoglobin: 14.5 g/dL (ref 12.0–15.0)
Immature Granulocytes: 1 %
Lymphocytes Relative: 31 %
Lymphs Abs: 1.9 10*3/uL (ref 0.7–4.0)
MCH: 29.3 pg (ref 26.0–34.0)
MCHC: 32.9 g/dL (ref 30.0–36.0)
MCV: 89.1 fL (ref 80.0–100.0)
Monocytes Absolute: 0.7 10*3/uL (ref 0.1–1.0)
Monocytes Relative: 11 %
Neutro Abs: 3.4 10*3/uL (ref 1.7–7.7)
Neutrophils Relative %: 55 %
Platelets: 535 10*3/uL — ABNORMAL HIGH (ref 150–400)
RBC: 4.95 MIL/uL (ref 3.87–5.11)
RDW: 16 % — ABNORMAL HIGH (ref 11.5–15.5)
WBC: 6.2 10*3/uL (ref 4.0–10.5)
nRBC: 0 % (ref 0.0–0.2)

## 2020-10-25 LAB — MAGNESIUM: Magnesium: 1.8 mg/dL (ref 1.7–2.4)

## 2020-10-25 LAB — BASIC METABOLIC PANEL
Anion gap: 9 (ref 5–15)
BUN: 15 mg/dL (ref 8–23)
CO2: 26 mmol/L (ref 22–32)
Calcium: 8.9 mg/dL (ref 8.9–10.3)
Chloride: 98 mmol/L (ref 98–111)
Creatinine, Ser: 0.94 mg/dL (ref 0.44–1.00)
GFR, Estimated: >60 mL/min (ref >60)
Glucose, Bld: 121 mg/dL — ABNORMAL HIGH (ref 70–99)
Potassium: 3.7 mmol/L (ref 3.5–5.1)
Sodium: 133 mmol/L — ABNORMAL LOW (ref 135–145)

## 2020-10-25 LAB — PHOSPHORUS: Phosphorus: 3.7 mg/dL (ref 2.5–4.6)

## 2020-10-25 MED ORDER — LEVOTHYROXINE 100 MCG TABLET
ORAL_TABLET | 11 refills | 0 days | Status: CP
Start: 2020-10-25 — End: ?

## 2020-10-26 DIAGNOSIS — Z940 Kidney transplant status: Principal | ICD-10-CM

## 2020-11-17 DIAGNOSIS — Z940 Kidney transplant status: Principal | ICD-10-CM

## 2020-11-19 MED ORDER — TACROLIMUS 0.5 MG CAPSULE, IMMEDIATE-RELEASE
ORAL_CAPSULE | 6 refills | 0 days | Status: CP
Start: 2020-11-19 — End: ?

## 2020-11-24 ENCOUNTER — Encounter: Admit: 2020-11-24 | Discharge: 2020-11-24 | Payer: MEDICARE | Attending: Nephrology | Primary: Nephrology

## 2020-11-24 ENCOUNTER — Other Ambulatory Visit
Admission: RE | Admit: 2020-11-24 | Discharge: 2020-11-24 | Disposition: A | Discharge status: Home / Self Care | Payer: Medicare Other | Attending: Nephrology | Admitting: Nephrology

## 2020-11-24 DIAGNOSIS — D631 Anemia in chronic kidney disease: Secondary | ICD-10-CM | POA: Insufficient documentation

## 2020-11-24 DIAGNOSIS — D899 Disorder involving the immune mechanism, unspecified: Secondary | ICD-10-CM | POA: Insufficient documentation

## 2020-11-24 DIAGNOSIS — B259 Cytomegaloviral disease, unspecified: Secondary | ICD-10-CM | POA: Diagnosis not present

## 2020-11-24 DIAGNOSIS — Z789 Other specified health status: Secondary | ICD-10-CM | POA: Diagnosis not present

## 2020-11-24 DIAGNOSIS — Z79899 Other long term (current) drug therapy: Secondary | ICD-10-CM | POA: Diagnosis not present

## 2020-11-24 DIAGNOSIS — E1129 Type 2 diabetes mellitus with other diabetic kidney complication: Secondary | ICD-10-CM | POA: Insufficient documentation

## 2020-11-24 DIAGNOSIS — Z9483 Pancreas transplant status: Secondary | ICD-10-CM | POA: Diagnosis not present

## 2020-11-24 DIAGNOSIS — X58XXXA Exposure to other specified factors, initial encounter: Secondary | ICD-10-CM | POA: Insufficient documentation

## 2020-11-24 DIAGNOSIS — Z940 Kidney transplant status: Secondary | ICD-10-CM | POA: Insufficient documentation

## 2020-11-24 DIAGNOSIS — N390 Urinary tract infection, site not specified: Secondary | ICD-10-CM | POA: Diagnosis not present

## 2020-11-24 DIAGNOSIS — T8610 Unspecified complication of kidney transplant: Secondary | ICD-10-CM | POA: Diagnosis not present

## 2020-11-24 DIAGNOSIS — Z114 Encounter for screening for human immunodeficiency virus [HIV]: Secondary | ICD-10-CM | POA: Insufficient documentation

## 2020-11-24 LAB — PHOSPHORUS: Phosphorus: 3.3 mg/dL (ref 2.5–4.6)

## 2020-11-24 LAB — CBC WITH DIFFERENTIAL/PLATELET
Abs Immature Granulocytes: 0.05 10*3/uL (ref 0.00–0.07)
Basophils Absolute: 0 10*3/uL (ref 0.0–0.1)
Basophils Relative: 0 %
Eosinophils Absolute: 0.1 10*3/uL (ref 0.0–0.5)
Eosinophils Relative: 1 %
HCT: 42.4 % (ref 36.0–46.0)
Hemoglobin: 13.9 g/dL (ref 12.0–15.0)
Immature Granulocytes: 1 %
Lymphocytes Relative: 25 %
Lymphs Abs: 2 10*3/uL (ref 0.7–4.0)
MCH: 29.7 pg (ref 26.0–34.0)
MCHC: 32.8 g/dL (ref 30.0–36.0)
MCV: 90.6 fL (ref 80.0–100.0)
Monocytes Absolute: 1 10*3/uL (ref 0.1–1.0)
Monocytes Relative: 12 %
Neutro Abs: 4.7 10*3/uL (ref 1.7–7.7)
Neutrophils Relative %: 61 %
Platelets: 739 10*3/uL — ABNORMAL HIGH (ref 150–400)
RBC: 4.68 MIL/uL (ref 3.87–5.11)
RDW: 16 % — ABNORMAL HIGH (ref 11.5–15.5)
WBC: 7.8 10*3/uL (ref 4.0–10.5)
nRBC: 0 % (ref 0.0–0.2)

## 2020-11-24 LAB — BASIC METABOLIC PANEL
Anion gap: 11 (ref 5–15)
BUN: 23 mg/dL (ref 8–23)
CO2: 27 mmol/L (ref 22–32)
Calcium: 9 mg/dL (ref 8.9–10.3)
Chloride: 99 mmol/L (ref 98–111)
Creatinine, Ser: 1.07 mg/dL — ABNORMAL HIGH (ref 0.44–1.00)
GFR, Estimated: 55 mL/min — ABNORMAL LOW (ref >60)
Glucose, Bld: 92 mg/dL (ref 70–99)
Potassium: 3.9 mmol/L (ref 3.5–5.1)
Sodium: 137 mmol/L (ref 135–145)

## 2020-11-24 LAB — MAGNESIUM: Magnesium: 1.8 mg/dL (ref 1.7–2.4)

## 2020-11-29 DIAGNOSIS — Z940 Kidney transplant status: Principal | ICD-10-CM

## 2020-12-28 ENCOUNTER — Encounter: Admit: 2020-12-28 | Discharge: 2020-12-28 | Payer: MEDICARE | Attending: Nephrology | Primary: Nephrology

## 2020-12-28 ENCOUNTER — Other Ambulatory Visit
Admission: RE | Admit: 2020-12-28 | Discharge: 2020-12-28 | Disposition: A | Discharge status: Home / Self Care | Payer: Medicare Other | Attending: Nephrology | Admitting: Nephrology

## 2020-12-28 ENCOUNTER — Other Ambulatory Visit: Payer: Self-pay

## 2020-12-28 DIAGNOSIS — B259 Cytomegaloviral disease, unspecified: Secondary | ICD-10-CM | POA: Diagnosis not present

## 2020-12-28 DIAGNOSIS — N189 Chronic kidney disease, unspecified: Secondary | ICD-10-CM | POA: Diagnosis not present

## 2020-12-28 DIAGNOSIS — Z9483 Pancreas transplant status: Secondary | ICD-10-CM | POA: Diagnosis not present

## 2020-12-28 DIAGNOSIS — D899 Disorder involving the immune mechanism, unspecified: Secondary | ICD-10-CM | POA: Diagnosis not present

## 2020-12-28 DIAGNOSIS — E1129 Type 2 diabetes mellitus with other diabetic kidney complication: Secondary | ICD-10-CM | POA: Diagnosis not present

## 2020-12-28 DIAGNOSIS — N390 Urinary tract infection, site not specified: Secondary | ICD-10-CM | POA: Diagnosis not present

## 2020-12-28 DIAGNOSIS — E1122 Type 2 diabetes mellitus with diabetic chronic kidney disease: Secondary | ICD-10-CM | POA: Diagnosis not present

## 2020-12-28 DIAGNOSIS — Z940 Kidney transplant status: Secondary | ICD-10-CM | POA: Insufficient documentation

## 2020-12-28 DIAGNOSIS — Z114 Encounter for screening for human immunodeficiency virus [HIV]: Secondary | ICD-10-CM | POA: Diagnosis not present

## 2020-12-28 DIAGNOSIS — Z09 Encounter for follow-up examination after completed treatment for conditions other than malignant neoplasm: Secondary | ICD-10-CM | POA: Diagnosis present

## 2020-12-28 DIAGNOSIS — Z789 Other specified health status: Secondary | ICD-10-CM | POA: Diagnosis not present

## 2020-12-28 DIAGNOSIS — E559 Vitamin D deficiency, unspecified: Secondary | ICD-10-CM | POA: Insufficient documentation

## 2020-12-28 DIAGNOSIS — D631 Anemia in chronic kidney disease: Secondary | ICD-10-CM | POA: Insufficient documentation

## 2020-12-28 DIAGNOSIS — T8610 Unspecified complication of kidney transplant: Secondary | ICD-10-CM | POA: Insufficient documentation

## 2020-12-28 DIAGNOSIS — Z79899 Other long term (current) drug therapy: Secondary | ICD-10-CM | POA: Insufficient documentation

## 2020-12-28 LAB — CBC WITH DIFFERENTIAL/PLATELET
Abs Immature Granulocytes: 0.1 10*3/uL — ABNORMAL HIGH (ref 0.00–0.07)
Basophils Absolute: 0 10*3/uL (ref 0.0–0.1)
Basophils Relative: 0 %
Eosinophils Absolute: 0.1 10*3/uL (ref 0.0–0.5)
Eosinophils Relative: 1 %
HCT: 44.4 % (ref 36.0–46.0)
Hemoglobin: 15 g/dL (ref 12.0–15.0)
Immature Granulocytes: 1 %
Lymphocytes Relative: 28 %
Lymphs Abs: 2.5 10*3/uL (ref 0.7–4.0)
MCH: 29.8 pg (ref 26.0–34.0)
MCHC: 33.8 g/dL (ref 30.0–36.0)
MCV: 88.1 fL (ref 80.0–100.0)
Monocytes Absolute: 1.1 10*3/uL — ABNORMAL HIGH (ref 0.1–1.0)
Monocytes Relative: 13 %
Neutro Abs: 5.1 10*3/uL (ref 1.7–7.7)
Neutrophils Relative %: 57 %
Platelets: 714 10*3/uL — ABNORMAL HIGH (ref 150–400)
RBC: 5.04 MIL/uL (ref 3.87–5.11)
RDW: 14.9 % (ref 11.5–15.5)
WBC: 8.9 10*3/uL (ref 4.0–10.5)
nRBC: 0 % (ref 0.0–0.2)

## 2020-12-28 LAB — BASIC METABOLIC PANEL
Anion gap: 11 (ref 5–15)
BUN: 33 mg/dL — ABNORMAL HIGH (ref 8–23)
CO2: 26 mmol/L (ref 22–32)
Calcium: 9.5 mg/dL (ref 8.9–10.3)
Chloride: 95 mmol/L — ABNORMAL LOW (ref 98–111)
Creatinine, Ser: 1.05 mg/dL — ABNORMAL HIGH (ref 0.44–1.00)
GFR, Estimated: 55 mL/min — ABNORMAL LOW (ref >60)
Glucose, Bld: 106 mg/dL — ABNORMAL HIGH (ref 70–99)
Potassium: 4.5 mmol/L (ref 3.5–5.1)
Sodium: 132 mmol/L — ABNORMAL LOW (ref 135–145)

## 2020-12-28 LAB — PHOSPHORUS: Phosphorus: 3.7 mg/dL (ref 2.5–4.6)

## 2020-12-28 LAB — MAGNESIUM: Magnesium: 1.8 mg/dL (ref 1.7–2.4)

## 2020-12-31 ENCOUNTER — Other Ambulatory Visit: Payer: Self-pay | Admitting: Internal Medicine

## 2020-12-31 DIAGNOSIS — R1011 Right upper quadrant pain: Secondary | ICD-10-CM

## 2021-01-04 DIAGNOSIS — Z940 Kidney transplant status: Principal | ICD-10-CM

## 2021-01-11 ENCOUNTER — Ambulatory Visit: Payer: Medicare Other

## 2021-01-18 ENCOUNTER — Ambulatory Visit
Admission: RE | Admit: 2021-01-18 | Discharge: 2021-01-18 | Disposition: A | Discharge status: Home / Self Care | Payer: Medicare Other | Source: Ambulatory Visit | Attending: Internal Medicine | Admitting: Internal Medicine

## 2021-01-18 ENCOUNTER — Other Ambulatory Visit: Payer: Self-pay

## 2021-01-18 DIAGNOSIS — R1011 Right upper quadrant pain: Secondary | ICD-10-CM | POA: Diagnosis present

## 2021-01-20 ENCOUNTER — Ambulatory Visit: Payer: Medicare Other

## 2021-01-20 ENCOUNTER — Encounter: Payer: Self-pay | Admitting: Podiatry

## 2021-01-20 ENCOUNTER — Ambulatory Visit (INDEPENDENT_AMBULATORY_CARE_PROVIDER_SITE_OTHER): Payer: Medicare Other | Admitting: Podiatry

## 2021-01-20 ENCOUNTER — Other Ambulatory Visit: Payer: Self-pay

## 2021-01-20 DIAGNOSIS — I10 Essential (primary) hypertension: Secondary | ICD-10-CM | POA: Insufficient documentation

## 2021-01-20 DIAGNOSIS — E1159 Type 2 diabetes mellitus with other circulatory complications: Secondary | ICD-10-CM

## 2021-01-20 DIAGNOSIS — Z794 Long term (current) use of insulin: Secondary | ICD-10-CM | POA: Diagnosis not present

## 2021-01-20 DIAGNOSIS — I999 Unspecified disorder of circulatory system: Secondary | ICD-10-CM | POA: Diagnosis not present

## 2021-01-20 DIAGNOSIS — R609 Edema, unspecified: Secondary | ICD-10-CM | POA: Insufficient documentation

## 2021-01-20 DIAGNOSIS — M549 Dorsalgia, unspecified: Secondary | ICD-10-CM | POA: Insufficient documentation

## 2021-01-20 DIAGNOSIS — S90424A Blister (nonthermal), right lesser toe(s), initial encounter: Secondary | ICD-10-CM

## 2021-01-20 DIAGNOSIS — M722 Plantar fascial fibromatosis: Secondary | ICD-10-CM

## 2021-01-20 DIAGNOSIS — I152 Hypertension secondary to endocrine disorders: Secondary | ICD-10-CM | POA: Insufficient documentation

## 2021-01-21 ENCOUNTER — Encounter: Payer: Self-pay | Admitting: Podiatry

## 2021-01-21 NOTE — Progress Notes (Signed)
Subjective:  Patient ID: Cindy Fischer, female    DOB: Jun 23, 1946,  MRN: [id number]  Chief Complaint  Patient presents with  . Foot Pain    Patient presents today for right foot redness, burning and peeling on bottom of foot, blister on top of right hallux   She has been treated for cellulitis RLE and finished antibiotics last week.    75 y.o. female presents with the above complaint.  Patient presents with complaint of right hallux blister formation.  Patient states that this started about few days ago has been burning skin is peeling there are some redness to it.  Patient had taken antibiotics and has completed course for cellulitis to the right leg.  She says the redness has improved considerably.  This has been going on since last Wednesday.  She is diabetic with last A1c of 6.5.  She has tried [employer] just wanted to get it evaluated.  She does have history of stent placement to the right leg she is being followed up with Dr. Johnsie Cancel for vascular.  She has not had any recent ABIs PVRs worked up since this definitely plan placement last year.   Review of Systems: Negative except as noted in the HPI. Denies N/V/F/Ch.  Past Medical History:  Diagnosis Date  . Cancer ([place]) 2016   skin (nose)  . Diabetes mellitus without complication ([place])   . Hypothyroidism   . Pneumonia   . Renal disorder     Current Outpatient Medications:  .  aspirin EC 81 MG tablet, Take 81 mg by mouth daily., Disp: , Rfl:  .  atorvastatin (LIPITOR) 20 MG tablet, Take 20 mg by mouth daily., Disp: , Rfl:  .  Cholecalciferol (VITAMIN D) 2000 units tablet, Take 2,000 Units by mouth daily., Disp: , Rfl:  .  diclofenac Sodium (VOLTAREN) 1 % GEL, Apply 2 g topically 4 (four) times daily., Disp: , Rfl:  .  Everolimus 0.25 MG TABS, Take 0.5 mg by mouth 2 (two) times daily., Disp: , Rfl:  .  insulin NPH Human (HUMULIN N,NOVOLIN N) 100 UNIT/ML injection, Inject 30 Units into the skin daily., Disp: , Rfl:  .  insulin  regular (NOVOLIN R,HUMULIN R) 100 units/mL injection, Inject 3-8 Units into the skin 3 (three) times daily before meals., Disp: , Rfl:  .  levothyroxine (SYNTHROID, LEVOTHROID) 100 MCG tablet, Take 100 mcg by mouth daily before breakfast., Disp: , Rfl:  .  lisinopril (PRINIVIL,ZESTRIL) 10 MG tablet, Take 10 mg by mouth daily., Disp: , Rfl:  .  omeprazole (PRILOSEC) 40 MG capsule, Take 40 mg by mouth every other day. , Disp: , Rfl: 3 .  predniSONE (DELTASONE) 5 MG tablet, Take 5 mg by mouth daily. , Disp: , Rfl:  .  tacrolimus (PROGRAF) 0.5 MG capsule, Take 1-1.5 mg by mouth See admin instructions. Take 1.5 mg in the morning and 1 mg in the evening, Disp: , Rfl:  .  traMADol (ULTRAM) 50 MG tablet, tramadol 50 mg tablet  1-2 tabs every 6 hours as needed for pain, Disp: , Rfl:   Social History   Tobacco Use  Smoking Status Former Smoker  . Types: Cigarettes  Smokeless Tobacco Never Used    Allergies  Allergen Reactions  . Latex   . Oxycodone-Acetaminophen Nausea And Vomiting  . Penicillins Rash    Has patient had a PCN reaction causing immediate rash, facial/tongue/throat swelling, SOB or lightheadedness with hypotension: Yes Has patient had a PCN reaction causing severe rash  involving mucus membranes or skin necrosis: No Has patient had a PCN reaction that required hospitalization: No Has patient had a PCN reaction occurring within the last 10 years: No If all of the above answers are "NO", then may proceed with Cephalosporin use.    Objective:  There were no vitals filed for this visit. There is no height or weight on file to calculate BMI. Constitutional Well developed. Well nourished.  Vascular Dorsalis pedis pulses non palpable bilaterally. Posterior tibial pulses non palpable bilaterally. Capillary refill normal to all digits.  No cyanosis or clubbing noted. Pedal hair growth normal.  Neurologic Normal speech. Oriented to person, place, and time. Epicritic sensation to  light touch grossly present bilaterally.  Dermatologic  superficial blister formation noted without any drainage present.  Appears to be very superficial mild erythema around it.  Mild maceration present.  No concern for deep infection or abscess or mass malodor.  Orthopedic: Normal joint ROM without pain or crepitus bilaterally. No visible deformities. No bony tenderness.   Radiographs: None Assessment:   1. Toe blister without infection, right, initial encounter   2. Vascular abnormality   3. Type 2 diabetes mellitus with other circulatory complication, with long-term current use of insulin ([place])    Plan:  Patient was evaluated and treated and all questions answered.  Right hallux blister formation -I explained to the patient the etiology of blister formation various treatment options were discussed.  I discussed with her the importance of shoe gear modification which may have been the likely culprit of the friction blister.  Given that she has poor vascular flow I believe she would benefit from evaluation of the vascular flow to the lower extremity especially given that she now has a history of superficial soft tissue loss.  Patient agrees with the plan would like to obtain vascular studies -She will apply Betadine wet-to-dry dressing changes every other day. -I will offload the blister site with surgical shoe.  Vascular abnormality -She is scheduled to get ABIs PVRs done.  No follow-ups on file.

## 2021-01-25 DIAGNOSIS — Z940 Kidney transplant status: Principal | ICD-10-CM

## 2021-01-25 DIAGNOSIS — Z79899 Other long term (current) drug therapy: Principal | ICD-10-CM

## 2021-01-26 ENCOUNTER — Ambulatory Visit: Admit: 2021-01-26 | Discharge: 2021-01-26 | Payer: MEDICARE | Attending: Nephrology | Primary: Nephrology

## 2021-01-26 ENCOUNTER — Ambulatory Visit: Admit: 2021-01-26 | Discharge: 2021-01-26 | Payer: MEDICARE

## 2021-01-26 DIAGNOSIS — I10 Essential (primary) hypertension: Principal | ICD-10-CM

## 2021-01-26 DIAGNOSIS — E785 Hyperlipidemia, unspecified: Principal | ICD-10-CM

## 2021-01-26 DIAGNOSIS — E039 Hypothyroidism, unspecified: Principal | ICD-10-CM

## 2021-01-26 DIAGNOSIS — Z794 Long term (current) use of insulin: Principal | ICD-10-CM

## 2021-01-26 DIAGNOSIS — Z79899 Other long term (current) drug therapy: Principal | ICD-10-CM

## 2021-01-26 DIAGNOSIS — M810 Age-related osteoporosis without current pathological fracture: Principal | ICD-10-CM

## 2021-01-26 DIAGNOSIS — Z4822 Encounter for aftercare following kidney transplant: Principal | ICD-10-CM

## 2021-01-26 DIAGNOSIS — E1151 Type 2 diabetes mellitus with diabetic peripheral angiopathy without gangrene: Principal | ICD-10-CM

## 2021-01-26 DIAGNOSIS — M1990 Unspecified osteoarthritis, unspecified site: Principal | ICD-10-CM

## 2021-01-26 DIAGNOSIS — Z48298 Encounter for aftercare following other organ transplant: Principal | ICD-10-CM

## 2021-01-26 DIAGNOSIS — D849 Immunodeficiency, unspecified: Principal | ICD-10-CM

## 2021-01-26 DIAGNOSIS — N200 Calculus of kidney: Principal | ICD-10-CM

## 2021-01-26 DIAGNOSIS — Z7982 Long term (current) use of aspirin: Principal | ICD-10-CM

## 2021-01-26 DIAGNOSIS — Z940 Kidney transplant status: Principal | ICD-10-CM

## 2021-01-26 DIAGNOSIS — E118 Type 2 diabetes mellitus with unspecified complications: Principal | ICD-10-CM

## 2021-01-26 DIAGNOSIS — Z85828 Personal history of other malignant neoplasm of skin: Principal | ICD-10-CM

## 2021-01-26 DIAGNOSIS — D751 Secondary polycythemia: Principal | ICD-10-CM

## 2021-02-02 ENCOUNTER — Ambulatory Visit (INDEPENDENT_AMBULATORY_CARE_PROVIDER_SITE_OTHER): Payer: Medicare Other

## 2021-02-02 ENCOUNTER — Other Ambulatory Visit: Payer: Self-pay

## 2021-02-02 ENCOUNTER — Ambulatory Visit (INDEPENDENT_AMBULATORY_CARE_PROVIDER_SITE_OTHER): Payer: Medicare Other | Admitting: Nurse Practitioner

## 2021-02-02 ENCOUNTER — Encounter (INDEPENDENT_AMBULATORY_CARE_PROVIDER_SITE_OTHER): Payer: Self-pay | Admitting: Nurse Practitioner

## 2021-02-02 ENCOUNTER — Telehealth (INDEPENDENT_AMBULATORY_CARE_PROVIDER_SITE_OTHER): Payer: Self-pay

## 2021-02-02 VITALS — BP 161/90 | HR 80 | Ht 62.0 in | Wt 156.0 lb

## 2021-02-02 DIAGNOSIS — I7025 Atherosclerosis of native arteries of other extremities with ulceration: Secondary | ICD-10-CM

## 2021-02-02 DIAGNOSIS — E1159 Type 2 diabetes mellitus with other circulatory complications: Secondary | ICD-10-CM

## 2021-02-02 DIAGNOSIS — I70213 Atherosclerosis of native arteries of extremities with intermittent claudication, bilateral legs: Secondary | ICD-10-CM | POA: Diagnosis not present

## 2021-02-02 DIAGNOSIS — E782 Mixed hyperlipidemia: Secondary | ICD-10-CM

## 2021-02-02 DIAGNOSIS — Z940 Kidney transplant status: Secondary | ICD-10-CM

## 2021-02-02 DIAGNOSIS — Z794 Long term (current) use of insulin: Secondary | ICD-10-CM

## 2021-02-02 NOTE — Telephone Encounter (Signed)
Spoke with the patient and she is scheduled with Dr. Delana Meyer for a RLE angio on 02/15/202 with a 11:30 arrival to the [facility]. Covid testing on 02/04/21 between 8-1 pm at the [facility]. Pre-procedure instructions were discussed and handed to the patient.

## 2021-02-03 ENCOUNTER — Ambulatory Visit: Payer: Medicare Other | Admitting: Podiatry

## 2021-02-04 ENCOUNTER — Other Ambulatory Visit
Admission: RE | Admit: 2021-02-04 | Discharge: 2021-02-04 | Disposition: A | Discharge status: Home / Self Care | Payer: Medicare Other | Source: Ambulatory Visit | Attending: Vascular Surgery | Admitting: Vascular Surgery

## 2021-02-04 ENCOUNTER — Other Ambulatory Visit: Payer: Self-pay

## 2021-02-04 DIAGNOSIS — Z01812 Encounter for preprocedural laboratory examination: Secondary | ICD-10-CM | POA: Insufficient documentation

## 2021-02-04 DIAGNOSIS — Z20822 Contact with and (suspected) exposure to covid-19: Secondary | ICD-10-CM | POA: Diagnosis not present

## 2021-02-04 LAB — SARS CORONAVIRUS 2 (TAT 6-24 HRS): SARS Coronavirus 2: NEGATIVE

## 2021-02-07 ENCOUNTER — Encounter (INDEPENDENT_AMBULATORY_CARE_PROVIDER_SITE_OTHER): Payer: Self-pay | Admitting: Nurse Practitioner

## 2021-02-07 NOTE — Progress Notes (Signed)
Subjective:    Patient ID: Cindy Fischer, female    DOB: May 26, 1946, 75 y.o.   MRN: [id number] Chief Complaint  Patient presents with  . Follow-up    U/S     The patient returns to the office for followup and review of the noninvasive studies. There has been a significant deterioration in the lower extremity symptoms.  This is been ongoing for about 3 weeks the patient notes interval shortening of their claudication distance and development of mild rest pain symptoms.  The patient has a new ulceration on her right great toe as well as necrotic changes at the tips of her third and fourth toes.  There have been no significant changes to the patient's overall health care.  The patient denies amaurosis fugax or recent TIA symptoms. There are no recent neurological changes noted. The patient denies history of DVT, PE or superficial thrombophlebitis. The patient denies recent episodes of angina or shortness of breath.   ABI's are noncompressible bilaterally however the TBI is 0 on the right and noncompressible on the left Duplex US of the lower extremity arterial system shows biphasic waveforms throughout the left lower extremity with absent waveforms at the distal peroneal artery.  The right lower extremity has primarily monophasic waveforms throughout the right lower extremity.  The distal SFA and popliteal artery appear to be occluded.  Dampened monophasic waveforms are present in the anterior tibial and posterior tibial arteries.   Review of Systems  Cardiovascular: Positive for leg swelling.  Skin: Positive for wound.       Objective:   Physical Exam Vitals reviewed.  Cardiovascular:     Rate and Rhythm: Normal rate.     Pulses:          Dorsalis pedis pulses are 0 on the right side.       Posterior tibial pulses are 0 on the right side.  Pulmonary:     Effort: Pulmonary effort is normal.  Feet:     Right foot:     Skin integrity: Ulcer and erythema present.  Skin:     Capillary Refill: Capillary refill takes more than 3 seconds.     Findings: Erythema present.  Neurological:     Mental Status: She is alert and oriented to person, place, and time.  Psychiatric:        Mood and Affect: Mood normal.        Behavior: Behavior normal.        Thought Content: Thought content normal.        Judgment: Judgment normal.     BP (!) 161/90 Comment: in arm W/O access  Pulse 80   Ht '5\' 2"'  (1.575 m)   Wt 156 lb (70.8 kg)   BMI 28.53 kg/m   Past Medical History:  Diagnosis Date  . Cancer ([place]) 2016   skin (nose)  . Diabetes mellitus without complication ([place])   . Hypothyroidism   . Pneumonia   . Renal disorder     Social History   Socioeconomic History  . Marital status: Widowed    Spouse name: Not on file  . Number of children: Not on file  . Years of education: Not on file  . Highest education level: Not on file  Occupational History  . Not on file  Tobacco Use  . Smoking status: Former Smoker    Types: Cigarettes  . Smokeless tobacco: Never Used  Vaping Use  . Vaping Use: Never used  Substance and Sexual  Activity  . Alcohol use: No  . Drug use: No  . Sexual activity: Not on file  Other Topics Concern  . Not on file  Social History Narrative  . Not on file   Social Determinants of Health   Financial Resource Strain: Not on file  Food Insecurity: Not on file  Transportation Needs: Not on file  Physical Activity: Not on file  Stress: Not on file  Social Connections: Not on file  Intimate Partner Violence: Not on file    Past Surgical History:  Procedure Laterality Date  . CATARACT EXTRACTION, BILATERAL    . COLONOSCOPY  12/02/2010  . COLONOSCOPY WITH PROPOFOL N/A 09/04/2016   Procedure: COLONOSCOPY WITH PROPOFOL;  Surgeon: Christene Lye, MD;  Location: [facility] ENDOSCOPY;  Service: Endoscopy;  Laterality: N/A;  . EYE SURGERY    . INSERTION OF DIALYSIS CATHETER  2014   removed 2014 (only had in for about 8 weeks)  .  laser surgery on eyes  1999  . LOWER EXTREMITY ANGIOGRAPHY Right 10/16/2018   Procedure: LOWER EXTREMITY ANGIOGRAPHY;  Surgeon: Katha Cabal, MD;  Location: [place] [facility];  Service: Cardiovascular;  Laterality: Right;  . 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 TONSILLECTOMY    . TUBAL LIGATION      Family History  Problem Relation Age of Onset  . Multiple myeloma Mother   . Diabetes Father   . Heart attack Father   . Breast cancer Neg Hx     Allergies  Allergen Reactions  . Latex Itching  . Oxycodone-Acetaminophen Nausea And Vomiting  . Penicillins Rash    Has patient had a PCN reaction causing immediate rash, facial/tongue/throat swelling, SOB or lightheadedness with hypotension: Yes Has patient had a PCN reaction causing severe rash involving mucus membranes or skin necrosis: No Has patient had a PCN reaction that required hospitalization: No Has patient had a PCN reaction occurring within the last 10 years: No If all of the above answers are "NO", then may proceed with Cephalosporin use.     CBC Latest Ref Rng & Units 12/28/2020 11/24/2020 10/25/2020  WBC 4.0 - 10.5 K/uL 8.9 7.8 6.2  Hemoglobin 12.0 - 15.0 g/dL 15.0 13.9 14.5  Hematocrit 36.0 - 46.0 % 44.4 42.4 44.1  Platelets 150 - 400 K/uL 714(H) 739(H) 535(H)      CMP     Component Value Date/Time   NA 132 (L) 12/28/2020 0808   NA 138 03/29/2015 0918   K 4.5 12/28/2020 0808   K 4.5 03/29/2015 0918   CL 95 (L) 12/28/2020 0808   CL 104 03/29/2015 0918   CO2 26 12/28/2020 0808   CO2 26 03/29/2015 0918   GLUCOSE 106 (H) 12/28/2020 0808   GLUCOSE 187 (H) 03/29/2015 0918   BUN 33 (H) 12/28/2020 0808   BUN 10 03/29/2015 0918   CREATININE 1.05 (H) 12/28/2020 0808   CREATININE 0.80 03/29/2015 0918   CALCIUM 9.5 12/28/2020 0808   CALCIUM 9.1 03/29/2015 0918   PROT 6.6 03/07/2017 0817   ALBUMIN 3.7 06/01/2017 0820   ALBUMIN 2.8 (L) 11/02/2014 0915   AST 25 06/01/2017 0820   AST 27 11/02/2014 0915    ALT 25 06/01/2017 0820   ALT 36 11/02/2014 0915   ALKPHOS 61 06/01/2017 0820   ALKPHOS 81 11/02/2014 0915   BILITOT 0.4 06/01/2017 0820   BILITOT 0.6 11/02/2014 0915   GFRNONAA 55 (L) 12/28/2020 0808   GFRNONAA >60 03/29/2015 0918   GFRAA >60 09/24/2020 6568  GFRAA >60 03/29/2015 0918     VAS [place] ABI WITH/WO TBI  Result Date: 02/03/2021 LOWER EXTREMITY DOPPLER STUDY Indications: Claudication, and peripheral artery disease.  Vascular Interventions: 10/16/2018:PTA and Stent placement in the Rt SFA and                         Popliteal Artery. Comparison Study: 03/22/2020 Performing Technologist: Almira Coaster RVS  Examination Guidelines: A complete evaluation includes at minimum, Doppler waveform signals and systolic blood pressure reading at the level of bilateral brachial, anterior tibial, and posterior tibial arteries, when vessel segments are accessible. Bilateral testing is considered an integral part of a complete examination. Photoelectric Plethysmograph (PPG) waveforms and toe systolic pressure readings are included as required and additional duplex testing as needed. Limited examinations for reoccurring indications may be performed as noted.  ABI Findings: +---------+------------------+-----+----------+----------+ Right    Rt Pressure (mmHg)IndexWaveform  Comment    +---------+------------------+-----+----------+----------+ Brachial                                  HDA Device +---------+------------------+-----+----------+----------+ ATA      250               1.85 monophasicNC         +---------+------------------+-----+----------+----------+ PTA      245               1.81 monophasic           +---------+------------------+-----+----------+----------+ Great Toe0                 0.00 Absent               +---------+------------------+-----+----------+----------+ +---------+------------------+-----+--------+-------+ Left     Lt Pressure  (mmHg)IndexWaveformComment +---------+------------------+-----+--------+-------+ Brachial 135                                    +---------+------------------+-----+--------+-------+ ATA      250               1.85 biphasicNC      +---------+------------------+-----+--------+-------+ PTA      250               1.85 biphasicNC      +---------+------------------+-----+--------+-------+ Great Toe244               1.81 Normal          +---------+------------------+-----+--------+-------+ +-------+-----------+-----------+------------+------------+ ABI/TBIToday's ABIToday's TBIPrevious ABIPrevious TBI +-------+-----------+-----------+------------+------------+ Right  >1.0 [place]    0          >1.0 [place]     .89          +-------+-----------+-----------+------------+------------+ Left   >1.0 [place]    1.81 [place]    >1.0 [place]     .96          +-------+-----------+-----------+------------+------------+ Bilateral ABIs appear essentially unchanged compared to prior study on 03/22/2020. Right TBIs appear decreased compared to prior study on 03/22/2020.  Summary: Right: Resting right ankle-brachial index indicates noncompressible right lower extremity arteries. The right toe-brachial index is abnormal. Left: Resting left ankle-brachial index indicates noncompressible left lower extremity arteries. The left toe-brachial index is normal.  *See table(s) above for measurements and observations.  Electronically signed by Hortencia Pilar MD on 02/03/2021 at 5:05:19 PM.    Final        Assessment & Plan:  1. Atherosclerosis of native arteries of the extremities with ulceration (HCC)  Recommend:  The patient has evidence of severe atherosclerotic changes of the right lower extremity associated with ulceration and tissue loss of the foot.  This represents a limb threatening ischemia and places the patient at the risk for limb loss.  Patient should undergo angiography of the lower extremities with  the hope for intervention for limb salvage.  The risks and benefits as well as the alternative therapies was discussed in detail with the patient.  All questions were answered.  Patient agrees to proceed with angiography.  The patient will follow up with me in the office after the procedure.    2. Mixed hyperlipidemia Continue statin as ordered and reviewed, no changes at this time   3. Type 2 diabetes mellitus with other circulatory complication, with long-term current use of insulin (HCC) Continue hypoglycemic medications as already ordered, these medications have been reviewed and there are no changes at this time.  Hgb A1C to be monitored as already arranged by primary service   4. Renal transplant, status post Nephrologist is aware of ulceration and importance of angiogram in this instance.  He agrees with intervention for treatment of her right lower extremity.  Will be sure to provide adequate hydration for patient.  Patient's renal function has been doing well prior to this.   Current Outpatient Medications on File Prior to Visit  Medication Sig Dispense Refill  . aspirin EC 81 MG tablet Take 81 mg by mouth daily.    Marland Kitchen atorvastatin (LIPITOR) 20 MG tablet Take 20 mg by mouth daily.    . Cholecalciferol (VITAMIN D) 2000 units tablet Take 2,000 Units by mouth daily.    . Everolimus 0.5 MG TABS Take 1 mg by mouth 2 (two) times daily.    . insulin NPH Human (HUMULIN N,NOVOLIN N) 100 UNIT/ML injection Inject 26 Units into the skin daily before breakfast.    . insulin regular (NOVOLIN R,HUMULIN R) 100 units/mL injection Inject 3-8 Units into the skin 2 (two) times daily as needed for high blood sugar. Sliding scale    . levothyroxine (SYNTHROID, LEVOTHROID) 100 MCG tablet Take 100 mcg by mouth daily before breakfast.    . lisinopril (PRINIVIL,ZESTRIL) 10 MG tablet Take 10 mg by mouth daily.    Marland Kitchen omeprazole (PRILOSEC) 40 MG capsule Take 40 mg by mouth every other day.   3  . predniSONE  (DELTASONE) 5 MG tablet Take 5 mg by mouth daily.     . tacrolimus (PROGRAF) 0.5 MG capsule Take 1-1.5 mg by mouth See admin instructions. Take 1.5 mg in the morning and 1 mg in the evening    . diclofenac Sodium (VOLTAREN) 1 % GEL Apply 2 g topically 4 (four) times daily.     No current facility-administered medications on file prior to visit.    There are no Patient Instructions on file for this visit. No follow-ups on file.   Kris Hartmann, NP

## 2021-02-07 NOTE — H&P (View-Only) (Signed)
Subjective:    Patient ID: Cindy Fischer, female    DOB: 1946/11/19, 75 y.o.   MRN: [id number] Chief Complaint  Patient presents with  . Follow-up    U/S     The patient returns to the office for followup and review of the noninvasive studies. There has been a significant deterioration in the lower extremity symptoms.  This is been ongoing for about 3 weeks the patient notes interval shortening of their claudication distance and development of mild rest pain symptoms.  The patient has a new ulceration on her right great toe as well as necrotic changes at the tips of her third and fourth toes.  There have been no significant changes to the patient's overall health care.  The patient denies amaurosis fugax or recent TIA symptoms. There are no recent neurological changes noted. The patient denies history of DVT, PE or superficial thrombophlebitis. The patient denies recent episodes of angina or shortness of breath.   ABI's are noncompressible bilaterally however the TBI is 0 on the right and noncompressible on the left Duplex US of the lower extremity arterial system shows biphasic waveforms throughout the left lower extremity with absent waveforms at the distal peroneal artery.  The right lower extremity has primarily monophasic waveforms throughout the right lower extremity.  The distal SFA and popliteal artery appear to be occluded.  Dampened monophasic waveforms are present in the anterior tibial and posterior tibial arteries.   Review of Systems  Cardiovascular: Positive for leg swelling.  Skin: Positive for wound.       Objective:   Physical Exam Vitals reviewed.  Cardiovascular:     Rate and Rhythm: Normal rate.     Pulses:          Dorsalis pedis pulses are 0 on the right side.       Posterior tibial pulses are 0 on the right side.  Pulmonary:     Effort: Pulmonary effort is normal.  Feet:     Right foot:     Skin integrity: Ulcer and erythema present.  Skin:     Capillary Refill: Capillary refill takes more than 3 seconds.     Findings: Erythema present.  Neurological:     Mental Status: She is alert and oriented to person, place, and time.  Psychiatric:        Mood and Affect: Mood normal.        Behavior: Behavior normal.        Thought Content: Thought content normal.        Judgment: Judgment normal.     BP (!) 161/90 Comment: in arm W/O access  Pulse 80   Ht '5\' 2"'  (1.575 m)   Wt 156 lb (70.8 kg)   BMI 28.53 kg/m   Past Medical History:  Diagnosis Date  . Cancer ([place]) 2016   skin (nose)  . Diabetes mellitus without complication ([place])   . Hypothyroidism   . Pneumonia   . Renal disorder     Social History   Socioeconomic History  . Marital status: Widowed    Spouse name: Not on file  . Number of children: Not on file  . Years of education: Not on file  . Highest education level: Not on file  Occupational History  . Not on file  Tobacco Use  . Smoking status: Former Smoker    Types: Cigarettes  . Smokeless tobacco: Never Used  Vaping Use  . Vaping Use: Never used  Substance and Sexual  Activity  . Alcohol use: No  . Drug use: No  . Sexual activity: Not on file  Other Topics Concern  . Not on file  Social History Narrative  . Not on file   Social Determinants of Health   Financial Resource Strain: Not on file  Food Insecurity: Not on file  Transportation Needs: Not on file  Physical Activity: Not on file  Stress: Not on file  Social Connections: Not on file  Intimate Partner Violence: Not on file    Past Surgical History:  Procedure Laterality Date  . CATARACT EXTRACTION, BILATERAL    . COLONOSCOPY  12/02/2010  . COLONOSCOPY WITH PROPOFOL N/A 09/04/2016   Procedure: COLONOSCOPY WITH PROPOFOL;  Surgeon: Christene Lye, MD;  Location: [facility] ENDOSCOPY;  Service: Endoscopy;  Laterality: N/A;  . EYE SURGERY    . INSERTION OF DIALYSIS CATHETER  2014   removed 2014 (only had in for about 8 weeks)  .  laser surgery on eyes  1999  . LOWER EXTREMITY ANGIOGRAPHY Right 10/16/2018   Procedure: LOWER EXTREMITY ANGIOGRAPHY;  Surgeon: Katha Cabal, MD;  Location: [place] [facility];  Service: Cardiovascular;  Laterality: Right;  . 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 TONSILLECTOMY    . TUBAL LIGATION      Family History  Problem Relation Age of Onset  . Multiple myeloma Mother   . Diabetes Father   . Heart attack Father   . Breast cancer Neg Hx     Allergies  Allergen Reactions  . Latex Itching  . Oxycodone-Acetaminophen Nausea And Vomiting  . Penicillins Rash    Has patient had a PCN reaction causing immediate rash, facial/tongue/throat swelling, SOB or lightheadedness with hypotension: Yes Has patient had a PCN reaction causing severe rash involving mucus membranes or skin necrosis: No Has patient had a PCN reaction that required hospitalization: No Has patient had a PCN reaction occurring within the last 10 years: No If all of the above answers are "NO", then may proceed with Cephalosporin use.     CBC Latest Ref Rng & Units 12/28/2020 11/24/2020 10/25/2020  WBC 4.0 - 10.5 K/uL 8.9 7.8 6.2  Hemoglobin 12.0 - 15.0 g/dL 15.0 13.9 14.5  Hematocrit 36.0 - 46.0 % 44.4 42.4 44.1  Platelets 150 - 400 K/uL 714(H) 739(H) 535(H)      CMP     Component Value Date/Time   NA 132 (L) 12/28/2020 0808   NA 138 03/29/2015 0918   K 4.5 12/28/2020 0808   K 4.5 03/29/2015 0918   CL 95 (L) 12/28/2020 0808   CL 104 03/29/2015 0918   CO2 26 12/28/2020 0808   CO2 26 03/29/2015 0918   GLUCOSE 106 (H) 12/28/2020 0808   GLUCOSE 187 (H) 03/29/2015 0918   BUN 33 (H) 12/28/2020 0808   BUN 10 03/29/2015 0918   CREATININE 1.05 (H) 12/28/2020 0808   CREATININE 0.80 03/29/2015 0918   CALCIUM 9.5 12/28/2020 0808   CALCIUM 9.1 03/29/2015 0918   PROT 6.6 03/07/2017 0817   ALBUMIN 3.7 06/01/2017 0820   ALBUMIN 2.8 (L) 11/02/2014 0915   AST 25 06/01/2017 0820   AST 27 11/02/2014 0915    ALT 25 06/01/2017 0820   ALT 36 11/02/2014 0915   ALKPHOS 61 06/01/2017 0820   ALKPHOS 81 11/02/2014 0915   BILITOT 0.4 06/01/2017 0820   BILITOT 0.6 11/02/2014 0915   GFRNONAA 55 (L) 12/28/2020 0808   GFRNONAA >60 03/29/2015 0918   GFRAA >60 09/24/2020 8119  GFRAA >60 03/29/2015 0918     VAS [place] ABI WITH/WO TBI  Result Date: 02/03/2021 LOWER EXTREMITY DOPPLER STUDY Indications: Claudication, and peripheral artery disease.  Vascular Interventions: 10/16/2018:PTA and Stent placement in the Rt SFA and                         Popliteal Artery. Comparison Study: 03/22/2020 Performing Technologist: Almira Coaster RVS  Examination Guidelines: A complete evaluation includes at minimum, Doppler waveform signals and systolic blood pressure reading at the level of bilateral brachial, anterior tibial, and posterior tibial arteries, when vessel segments are accessible. Bilateral testing is considered an integral part of a complete examination. Photoelectric Plethysmograph (PPG) waveforms and toe systolic pressure readings are included as required and additional duplex testing as needed. Limited examinations for reoccurring indications may be performed as noted.  ABI Findings: +---------+------------------+-----+----------+----------+ Right    Rt Pressure (mmHg)IndexWaveform  Comment    +---------+------------------+-----+----------+----------+ Brachial                                  HDA Device +---------+------------------+-----+----------+----------+ ATA      250               1.85 monophasicNC         +---------+------------------+-----+----------+----------+ PTA      245               1.81 monophasic           +---------+------------------+-----+----------+----------+ Great Toe0                 0.00 Absent               +---------+------------------+-----+----------+----------+ +---------+------------------+-----+--------+-------+ Left     Lt Pressure  (mmHg)IndexWaveformComment +---------+------------------+-----+--------+-------+ Brachial 135                                    +---------+------------------+-----+--------+-------+ ATA      250               1.85 biphasicNC      +---------+------------------+-----+--------+-------+ PTA      250               1.85 biphasicNC      +---------+------------------+-----+--------+-------+ Great Toe244               1.81 Normal          +---------+------------------+-----+--------+-------+ +-------+-----------+-----------+------------+------------+ ABI/TBIToday's ABIToday's TBIPrevious ABIPrevious TBI +-------+-----------+-----------+------------+------------+ Right  >1.0 [place]    0          >1.0 [place]     .89          +-------+-----------+-----------+------------+------------+ Left   >1.0 [place]    1.81 [place]    >1.0 [place]     .96          +-------+-----------+-----------+------------+------------+ Bilateral ABIs appear essentially unchanged compared to prior study on 03/22/2020. Right TBIs appear decreased compared to prior study on 03/22/2020.  Summary: Right: Resting right ankle-brachial index indicates noncompressible right lower extremity arteries. The right toe-brachial index is abnormal. Left: Resting left ankle-brachial index indicates noncompressible left lower extremity arteries. The left toe-brachial index is normal.  *See table(s) above for measurements and observations.  Electronically signed by Hortencia Pilar MD on 02/03/2021 at 5:05:19 PM.    Final        Assessment & Plan:  1. Atherosclerosis of native arteries of the extremities with ulceration (HCC)  Recommend:  The patient has evidence of severe atherosclerotic changes of the right lower extremity associated with ulceration and tissue loss of the foot.  This represents a limb threatening ischemia and places the patient at the risk for limb loss.  Patient should undergo angiography of the lower extremities with  the hope for intervention for limb salvage.  The risks and benefits as well as the alternative therapies was discussed in detail with the patient.  All questions were answered.  Patient agrees to proceed with angiography.  The patient will follow up with me in the office after the procedure.    2. Mixed hyperlipidemia Continue statin as ordered and reviewed, no changes at this time   3. Type 2 diabetes mellitus with other circulatory complication, with long-term current use of insulin (HCC) Continue hypoglycemic medications as already ordered, these medications have been reviewed and there are no changes at this time.  Hgb A1C to be monitored as already arranged by primary service   4. Renal transplant, status post Nephrologist is aware of ulceration and importance of angiogram in this instance.  He agrees with intervention for treatment of her right lower extremity.  Will be sure to provide adequate hydration for patient.  Patient's renal function has been doing well prior to this.   Current Outpatient Medications on File Prior to Visit  Medication Sig Dispense Refill  . aspirin EC 81 MG tablet Take 81 mg by mouth daily.    Marland Kitchen atorvastatin (LIPITOR) 20 MG tablet Take 20 mg by mouth daily.    . Cholecalciferol (VITAMIN D) 2000 units tablet Take 2,000 Units by mouth daily.    . Everolimus 0.5 MG TABS Take 1 mg by mouth 2 (two) times daily.    . insulin NPH Human (HUMULIN N,NOVOLIN N) 100 UNIT/ML injection Inject 26 Units into the skin daily before breakfast.    . insulin regular (NOVOLIN R,HUMULIN R) 100 units/mL injection Inject 3-8 Units into the skin 2 (two) times daily as needed for high blood sugar. Sliding scale    . levothyroxine (SYNTHROID, LEVOTHROID) 100 MCG tablet Take 100 mcg by mouth daily before breakfast.    . lisinopril (PRINIVIL,ZESTRIL) 10 MG tablet Take 10 mg by mouth daily.    Marland Kitchen omeprazole (PRILOSEC) 40 MG capsule Take 40 mg by mouth every other day.   3  . predniSONE  (DELTASONE) 5 MG tablet Take 5 mg by mouth daily.     . tacrolimus (PROGRAF) 0.5 MG capsule Take 1-1.5 mg by mouth See admin instructions. Take 1.5 mg in the morning and 1 mg in the evening    . diclofenac Sodium (VOLTAREN) 1 % GEL Apply 2 g topically 4 (four) times daily.     No current facility-administered medications on file prior to visit.    There are no Patient Instructions on file for this visit. No follow-ups on file.   Kris Hartmann, NP

## 2021-02-08 ENCOUNTER — Other Ambulatory Visit: Payer: Self-pay

## 2021-02-08 ENCOUNTER — Encounter
Admission: RE | Disposition: A | Discharge status: Home / Self Care | Payer: Self-pay | Source: Ambulatory Visit | Attending: Vascular Surgery

## 2021-02-08 ENCOUNTER — Ambulatory Visit
Admission: RE | Admit: 2021-02-08 | Discharge: 2021-02-08 | Disposition: A | Discharge status: Home / Self Care | Payer: Medicare Other | Source: Ambulatory Visit | Attending: Vascular Surgery | Admitting: Vascular Surgery

## 2021-02-08 ENCOUNTER — Encounter: Payer: Self-pay | Admitting: Vascular Surgery

## 2021-02-08 ENCOUNTER — Other Ambulatory Visit (INDEPENDENT_AMBULATORY_CARE_PROVIDER_SITE_OTHER): Payer: Self-pay | Admitting: Nurse Practitioner

## 2021-02-08 DIAGNOSIS — Z91040 Latex allergy status: Secondary | ICD-10-CM | POA: Diagnosis not present

## 2021-02-08 DIAGNOSIS — Z79899 Other long term (current) drug therapy: Secondary | ICD-10-CM | POA: Diagnosis not present

## 2021-02-08 DIAGNOSIS — Z87891 Personal history of nicotine dependence: Secondary | ICD-10-CM | POA: Insufficient documentation

## 2021-02-08 DIAGNOSIS — Z885 Allergy status to narcotic agent status: Secondary | ICD-10-CM | POA: Insufficient documentation

## 2021-02-08 DIAGNOSIS — Z7982 Long term (current) use of aspirin: Secondary | ICD-10-CM | POA: Insufficient documentation

## 2021-02-08 DIAGNOSIS — I998 Other disorder of circulatory system: Secondary | ICD-10-CM

## 2021-02-08 DIAGNOSIS — Z880 Allergy status to penicillin: Secondary | ICD-10-CM | POA: Insufficient documentation

## 2021-02-08 DIAGNOSIS — I70213 Atherosclerosis of native arteries of extremities with intermittent claudication, bilateral legs: Secondary | ICD-10-CM | POA: Diagnosis not present

## 2021-02-08 DIAGNOSIS — L97519 Non-pressure chronic ulcer of other part of right foot with unspecified severity: Secondary | ICD-10-CM | POA: Diagnosis not present

## 2021-02-08 DIAGNOSIS — E782 Mixed hyperlipidemia: Secondary | ICD-10-CM | POA: Diagnosis not present

## 2021-02-08 DIAGNOSIS — Z794 Long term (current) use of insulin: Secondary | ICD-10-CM | POA: Diagnosis not present

## 2021-02-08 DIAGNOSIS — I70234 Atherosclerosis of native arteries of right leg with ulceration of heel and midfoot: Secondary | ICD-10-CM | POA: Diagnosis not present

## 2021-02-08 DIAGNOSIS — E1151 Type 2 diabetes mellitus with diabetic peripheral angiopathy without gangrene: Secondary | ICD-10-CM | POA: Diagnosis present

## 2021-02-08 DIAGNOSIS — I70235 Atherosclerosis of native arteries of right leg with ulceration of other part of foot: Secondary | ICD-10-CM | POA: Insufficient documentation

## 2021-02-08 DIAGNOSIS — I70202 Unspecified atherosclerosis of native arteries of extremities, left leg: Secondary | ICD-10-CM | POA: Diagnosis not present

## 2021-02-08 DIAGNOSIS — E11621 Type 2 diabetes mellitus with foot ulcer: Secondary | ICD-10-CM | POA: Diagnosis not present

## 2021-02-08 DIAGNOSIS — Z79890 Hormone replacement therapy (postmenopausal): Secondary | ICD-10-CM | POA: Insufficient documentation

## 2021-02-08 DIAGNOSIS — Z940 Kidney transplant status: Secondary | ICD-10-CM | POA: Insufficient documentation

## 2021-02-08 HISTORY — PX: LOWER EXTREMITY ANGIOGRAPHY: CATH118251

## 2021-02-08 LAB — GLUCOSE, CAPILLARY: Glucose-Capillary: 168 mg/dL — ABNORMAL HIGH (ref 70–99)

## 2021-02-08 SURGERY — LOWER EXTREMITY ANGIOGRAPHY
Anesthesia: Moderate Sedation | Site: Leg Lower | Laterality: Right

## 2021-02-08 MED ORDER — CLINDAMYCIN PHOSPHATE 300 MG/50ML IV SOLN: 300.0000 mg | Freq: Once | INTRAVENOUS | Status: AC

## 2021-02-08 MED ORDER — ACETAMINOPHEN 325 MG PO TABS: 650.0000 mg | ORAL_TABLET | ORAL | Status: DC | PRN

## 2021-02-08 MED ORDER — FENTANYL CITRATE (PF) 100 MCG/2ML IJ SOLN
INTRAMUSCULAR | Status: AC
  Filled 2021-02-08: qty 2

## 2021-02-08 MED ORDER — SODIUM CHLORIDE 0.9 % IV SOLN
Freq: Once | INTRAVENOUS | Status: AC
  Administered 2021-02-08: 500 mL via INTRAVENOUS

## 2021-02-08 MED ORDER — MIDAZOLAM HCL 2 MG/2ML IJ SOLN
INTRAMUSCULAR | Status: DC | PRN
  Administered 2021-02-08: 1 mg via INTRAVENOUS
  Administered 2021-02-08: 0.5 mg via INTRAVENOUS
  Administered 2021-02-08 (×2): 1 mg via INTRAVENOUS
  Administered 2021-02-08 (×2): 0.5 mg via INTRAVENOUS
  Administered 2021-02-08: 1 mg via INTRAVENOUS

## 2021-02-08 MED ORDER — ATORVASTATIN CALCIUM 10 MG PO TABS
10.0000 mg | ORAL_TABLET | Freq: Every day | ORAL | Status: DC
  Filled 2021-02-08 (×2): qty 1

## 2021-02-08 MED ORDER — SODIUM CHLORIDE 0.9% FLUSH
3.0000 mL | INTRAVENOUS | Status: DC | PRN
Start: 2021-02-08 — End: 2021-02-09

## 2021-02-08 MED ORDER — LABETALOL HCL 5 MG/ML IV SOLN: 10.0000 mg | INTRAVENOUS | Status: DC | PRN

## 2021-02-08 MED ORDER — CLOPIDOGREL BISULFATE 300 MG PO TABS: 300.0000 mg | ORAL_TABLET | ORAL | Status: DC

## 2021-02-08 MED ORDER — SODIUM CHLORIDE 0.9 % IV BOLUS: 250.0000 mL | Freq: Once | INTRAVENOUS | Status: DC

## 2021-02-08 MED ORDER — CLINDAMYCIN PHOSPHATE 300 MG/50ML IV SOLN
INTRAVENOUS | Status: AC
  Administered 2021-02-08: 300 mg via INTRAVENOUS
  Filled 2021-02-08: qty 50

## 2021-02-08 MED ORDER — SODIUM CHLORIDE 0.9 % IV SOLN: INTRAVENOUS | Status: DC

## 2021-02-08 MED ORDER — MIDAZOLAM HCL 5 MG/5ML IJ SOLN
INTRAMUSCULAR | Status: AC
  Filled 2021-02-08: qty 5

## 2021-02-08 MED ORDER — ONDANSETRON HCL 4 MG/2ML IJ SOLN: 4.0000 mg | Freq: Four times a day (QID) | INTRAMUSCULAR | Status: DC | PRN

## 2021-02-08 MED ORDER — DIPHENHYDRAMINE HCL 50 MG/ML IJ SOLN: 50.0000 mg | Freq: Once | INTRAMUSCULAR | Status: DC | PRN

## 2021-02-08 MED ORDER — SODIUM CHLORIDE 0.9 % IV SOLN
250.0000 mL | INTRAVENOUS | Status: DC | PRN
Start: 2021-02-08 — End: 2021-02-09

## 2021-02-08 MED ORDER — FAMOTIDINE 20 MG PO TABS: 40.0000 mg | ORAL_TABLET | Freq: Once | ORAL | Status: DC | PRN

## 2021-02-08 MED ORDER — OXYCODONE HCL 5 MG PO TABS: 5.0000 mg | ORAL_TABLET | ORAL | Status: DC | PRN

## 2021-02-08 MED ORDER — METHYLPREDNISOLONE SODIUM SUCC 125 MG IJ SOLR: 125.0000 mg | Freq: Once | INTRAMUSCULAR | Status: DC | PRN

## 2021-02-08 MED ORDER — HEPARIN SODIUM (PORCINE) 1000 UNIT/ML IJ SOLN
INTRAMUSCULAR | Status: DC | PRN
  Administered 2021-02-08: 4000 [IU] via INTRAVENOUS

## 2021-02-08 MED ORDER — FENTANYL CITRATE (PF) 100 MCG/2ML IJ SOLN
INTRAMUSCULAR | Status: DC | PRN
  Administered 2021-02-08: 50 ug via INTRAVENOUS
  Administered 2021-02-08 (×2): 25 ug via INTRAVENOUS
  Administered 2021-02-08: 50 ug via INTRAVENOUS
  Administered 2021-02-08 (×3): 25 ug via INTRAVENOUS

## 2021-02-08 MED ORDER — HEPARIN SODIUM (PORCINE) 1000 UNIT/ML IJ SOLN
INTRAMUSCULAR | Status: AC
  Filled 2021-02-08: qty 1

## 2021-02-08 MED ORDER — HYDRALAZINE HCL 20 MG/ML IJ SOLN: 5.0000 mg | INTRAMUSCULAR | Status: DC | PRN

## 2021-02-08 MED ORDER — MORPHINE SULFATE (PF) 4 MG/ML IV SOLN: 2.0000 mg | INTRAVENOUS | Status: DC | PRN

## 2021-02-08 MED ORDER — SODIUM CHLORIDE 0.9% FLUSH: 3.0000 mL | Freq: Two times a day (BID) | INTRAVENOUS | Status: DC

## 2021-02-08 MED ORDER — MIDAZOLAM HCL 2 MG/ML PO SYRP: 8.0000 mg | ORAL_SOLUTION | Freq: Once | ORAL | Status: DC | PRN

## 2021-02-08 MED ORDER — MIDAZOLAM HCL 2 MG/2ML IJ SOLN
INTRAMUSCULAR | Status: AC
  Filled 2021-02-08: qty 2

## 2021-02-08 MED ORDER — CLOPIDOGREL BISULFATE 75 MG PO TABS: 75.0000 mg | ORAL_TABLET | Freq: Every day | ORAL | 4 refills | Status: DC

## 2021-02-08 SURGICAL SUPPLY — 29 items
BALLN MUSTANG 10X60X75 (BALLOONS) ×2
BALLN ULTRVRSE 10X40X75 (BALLOONS) ×2
BALLN ULTRVRSE 7X40X75C (BALLOONS) ×2
BALLOON MUSTANG 10X60X75 (BALLOONS) ×1 IMPLANT
BALLOON ULTRVRSE 10X40X75 (BALLOONS) ×1 IMPLANT
BALLOON ULTRVRSE 7X40X75C (BALLOONS) ×1 IMPLANT
CANNULA 5F STIFF (CANNULA) ×2 IMPLANT
CATH ANGIO 5F PIGTAIL 65CM (CATHETERS) ×2 IMPLANT
CATH SEEKER .035X90 (CATHETERS) ×2 IMPLANT
CATH TEMPO 5F RIM 65CM (CATHETERS) ×2 IMPLANT
DEVICE SAFEGUARD 24CM (GAUZE/BANDAGES/DRESSINGS) ×4 IMPLANT
DEVICE STARCLOSE SE CLOSURE (Vascular Products) ×2 IMPLANT
DEVICE TORQUE (MISCELLANEOUS) ×2 IMPLANT
GLIDECATH 4FR STR (CATHETERS) ×2 IMPLANT
GLIDEWIRE ADV .035X180CM (WIRE) ×2 IMPLANT
GLIDEWIRE ADV .035X260CM (WIRE) ×2 IMPLANT
INTRODUCER 7FR 23CM (INTRODUCER) ×4 IMPLANT
KIT ENCORE 26 ADVANTAGE (KITS) ×4 IMPLANT
NEEDLE ENTRY 21GA 7CM ECHOTIP (NEEDLE) ×2 IMPLANT
PACK ANGIOGRAPHY (CUSTOM PROCEDURE TRAY) ×2 IMPLANT
SET INTRO CAPELLA COAXIAL (SET/KITS/TRAYS/PACK) ×2 IMPLANT
SHEATH BRITE TIP 5FRX11 (SHEATH) ×2 IMPLANT
SHIELD X-DRAPE GOLD 12X17 (MISCELLANEOUS) ×4 IMPLANT
STENT LIFESTREAM 9X38X80 (Permanent Stent) ×2 IMPLANT
STENT LIFESTREAM 9X58X80 (Permanent Stent) ×2 IMPLANT
SYR MEDRAD MARK 7 150ML (SYRINGE) ×2 IMPLANT
TUBING CONTRAST HIGH PRESS 72 (TUBING) ×4 IMPLANT
WIRE GUIDERIGHT .035X150 (WIRE) ×2 IMPLANT
WIRE MAGIC TORQUE 315CM (WIRE) ×2 IMPLANT

## 2021-02-08 NOTE — Discharge Instructions (Signed)
Femoral Site Care  This sheet gives you information about how to care for yourself after your procedure. Your health care provider may also give you more specific instructions. If you have problems or questions, contact your health care provider. What can I expect after the procedure? After the procedure, it is common to have:  Bruising that usually fades within 1-2 weeks.  Tenderness at the site. Follow these instructions at home: Wound care  Follow instructions from your health care provider about how to take care of your insertion site. Make sure you: ? Wash your hands with soap and water before you change your bandage (dressing). If soap and water are not available, use hand sanitizer. ? Change your dressing as told by your health care provider. ? Leave stitches (sutures), skin glue, or adhesive strips in place. These skin closures may need to stay in place for 2 weeks or longer. If adhesive strip edges start to loosen and curl up, you may trim the loose edges. Do not remove adhesive strips completely unless your health care provider tells you to do that.  Do not take baths, swim, or use a hot tub until your health care provider approves.  You may shower 24-48 hours after the procedure or as told by your health care provider. ? Gently wash the site with plain soap and water. ? Pat the area dry with a clean towel. ? Do not rub the site. This may cause bleeding.  Do not apply powder or lotion to the site. Keep the site clean and dry.  Check your femoral site every day for signs of infection. Check for: ? Redness, swelling, or pain. ? Fluid or blood. ? Warmth. ? Pus or a bad smell. Activity  For the first 2-3 days after your procedure, or as long as directed: ? Avoid climbing stairs as much as possible. ? Do not squat.  Do not lift anything that is heavier than 10 lb (4.5 kg), or the limit that you are told, until your health care provider says that it is safe.  Rest as  directed. ? Avoid sitting for a long time without moving. Get up to take short walks every 1-2 hours.  Do not drive for 24 hours if you were given a medicine to help you relax (sedative). General instructions  Take over-the-counter and prescription medicines only as told by your health care provider.  Keep all follow-up visits as told by your health care provider. This is important. Contact a health care provider if you have:  A fever or chills.  You have redness, swelling, or pain around your insertion site. Get help right away if:  The catheter insertion area swells very fast.  You pass out.  You suddenly start to sweat or your skin gets clammy.  The catheter insertion area is bleeding, and the bleeding does not stop when you hold steady pressure on the area.  The area near or just beyond the catheter insertion site becomes pale, cool, tingly, or numb. These symptoms may represent a serious problem that is an emergency. Do not wait to see if the symptoms will go away. Get medical help right away. Call your local emergency services (911 in the [place] Do not drive yourself to the hospital. Summary  After the procedure, it is common to have bruising that usually fades within 1-2 weeks.  Check your femoral site every day for signs of infection.  Do not lift anything that is heavier than 10 lb (4.5 kg), or   the limit that you are told, until your health care provider says that it is safe. This information is not intended to replace advice given to you by your health care provider. Make sure you discuss any questions you have with your health care provider. Document Revised: 08/13/2020 Document Reviewed: 08/13/2020 Elsevier Patient Education  2021 [employer]  

## 2021-02-08 NOTE — Interval H&P Note (Signed)
History and Physical Interval Note:  02/08/2021 1:49 PM  Cindy Fischer  has presented today for surgery, with the diagnosis of RT lower leg angio   BARD   ischemic leg Covid Feb 11.  The various methods of treatment have been discussed with the patient and family. After consideration of risks, benefits and other options for treatment, the patient has consented to  Procedure(s): LOWER EXTREMITY ANGIOGRAPHY (Right) as a surgical intervention.  The patient's history has been reviewed, patient examined, no change in status, stable for surgery.  I have reviewed the patient's chart and labs.  Questions were answered to the patient's satisfaction.     Hortencia Pilar

## 2021-02-08 NOTE — Op Note (Signed)
[facility]  Percutaneous Study/Intervention Procedural Note   Date of Surgery: 02/08/2021  Surgeon:Schnier, Dolores Lory   Pre-operative Diagnosis: Atherosclerotic occlusive disease bilateral lower extremities with ulceration of the right foot  Post-operative diagnosis:  Same  Procedure(s) Performed:  1.  Abdominal aortogram  2.  Bilateral distal runoff  3.  Percutaneous transluminal angioplasty and stent placement right common iliac artery; "kissing balloon" technique  4.  Percutaneous transluminal and plasty and stent placement left common iliac artery; "kissing balloon" technique  5.  Ultrasound guided access bilateral common femoral arteries  6.  StarClose closure device bilateral common femoral arteries  Anesthesia: Conscious sedation was administered under my direct supervision by the interventional radiology RN. IV Versed plus fentanyl were utilized. Continuous ECG, pulse oximetry and blood pressure was monitored throughout the entire procedure. Conscious sedation was for a total of 1 hour 54 minutes 19 seconds.  Sheath: 7 French sheath bilateral common femoral arteries retrograde  Contrast: 90 cc  Fluoroscopy Time: 11.1 minutes  Indications: Patient presented to the office with ulcerations of the right lower extremity.  She has known atherosclerotic occlusive disease of the lower extremities and is undergone intervention in the right several years ago.  Noninvasive studies as well as physical examination demonstrated progression of her disease and therefore angiography with hope for intervention has been recommended for limb salvage.  Risks and benefits were reviewed all questions answered patient agrees to proceed.   Procedure:  Cindy Rosano O'Hairis a 75 y.o. female who was identified and appropriate procedural time out was performed.  The patient was then placed supine on the table and prepped and draped in the usual sterile fashion.  Ultrasound was used to  evaluate the left common femoral artery.  It was echolucent and pulsatile indicating it is patent .  An ultrasound image was acquired for the permanent record.  A micropuncture needle was used to access the left common femoral artery under direct ultrasound guidance.  The microwire was then advanced under fluoroscopic guidance without difficulty followed by the micro-sheath  A 0.035 J wire was advanced without resistance and a 5Fr sheath was placed.    The pigtail catheter was then positioned at the level of T12 and an AP image of the aorta was obtained. After review the images the pigtail catheter was repositioned above the aortic bifurcation and bilateral oblique views of the pelvis were obtained.  Using a rim catheter and an advantage wire the aortic bifurcation was crossed and the wire was advanced down into the distal external iliac artery on the right.  An RAO projection of the right common femoral and femoral bifurcation was then performed by hand-injection.  The wire was reintroduced and the pigtail catheter was negotiated down into the SFA where distal runoff was performed.  After review the images the pigtail catheter was removed over wire and the advantage wire is advanced through the the left sheath up into the aorta.  Diagnostic interpretation: The abdominal aorta is opacified with a bolus injection contrast.  There is diffuse calcifications but there are no hemodynamically significant stenoses.  The aortic bifurcation demonstrates a bulky coral reef-like lesion in the proximal right common femoral.  In both RAO and LAO views this is identified and appears to correlate with between an 80 and 90% stenosis.  On the left there are tandem lesions in the distal common which appear to be approximately 70% stenoses.  The external iliac arteries are widely patent bilaterally.  Internal iliac arteries  are patent bilaterally.  Of note there is a renal transplant noted with its arterial origin in the mid  external iliac artery.  Initial views suggested there was some narrowing at the anastomotic level however in a steep LAO projection with some caudal angle added there does not appear to be any abnormality of the anastomosis.  The right common femoral is widely patent as is the profunda femoris.  The superficial femoral artery appears to be patent although diffusely diseased in its proximal half.  At [facility] canal previously placed stent is identified and the stent occludes at its origin.  There is reconstitution of the above-knee popliteal within the stented segment and the remaining portion of the popliteal distally is patent.  The trifurcation is patent although there is a 70 to 80% stenosis at the origin of the anterior tibial beyond this lesion the anterior tibial is widely patent down to the foot.  Tibioperoneal trunk posterior tibial and peroneal are all patent down to the foot without hemodynamically significant stenosis.  4000 units of heparin was given and allowed to circulate for proximally 4 minutes.  The left 5 French sheath was then exchanged for a 7 French 25 cm sheath with its tip positioned in the distal aorta.  The ultrasound was reprepped and delivered back onto the sterile field. The right common femoral was then imaged with the ultrasound it was noted to be echolucent and pulsatile indicating patency. Images recorded for the permanent record. Under real-time visualization a microneedle was inserted into the anterior wall the common femoral artery microwire was then advanced without difficulty under fluoroscopic guidance followed by placement of the micro-sheath.  A advantage wire was then negotiated under fluoroscopic guidance into the aorta.  7 French 25 cm sheath was then placed again with its tip located in the distal aorta.  Magnified images of the aortic bifurcation were then made using hand injection contrast from the femoral sheaths.  A seeker catheter was then introduced up the  right side and with the magnified image the 1 cm markers were then used to verify/calibrate the machine.  Bilateral common iliac arteries were then measured and noted to be approximately 9 mm in diameter.  After appropriate sizing a 9 mm x 38 mm lifestream stent was selected for the right and a 9 mm x 58 mm lifestream stent was selected for the left. There were then advanced and positioned just above the aortic bifurcation. Insufflation for full expansion of the stents was performed simultaneously.  Next a 10 x 40 balloon was advanced up the right and a 10 x 60 balloon was advanced up the left.  They were simultaneously inflated to 8 atm for 30 seconds.  Follow-up imaging was then performed and the aortic bifurcation is now well treated with less than 5% residual stenosis and wide patency of both common femorals and preservation of the distal runoff.  Oblique views were then obtained of the groins in succession and Star close device is deployed on the right under fluoroscopic guidance.  Although the footplates deployed in the usual fashion in the device was pulled back and seated on the arteriotomy attempts at advancing the thumb lever down to the base of the machine were very difficult.  Fourth step of the StarClose seem to deploy without difficulty however on inspection of the device the actual Star close fixture was still present and therefore manual pressure was held.  After 20 minutes of pressure there was still concern and I advanced an advantage  wire from the left up and over the bifurcation and down to the distal external iliac where hand-injection of contrast was performed.  There was a suspicious area and therefore a 7 mm x 40 mm balloon was advanced across the common femoral inflated to just 4 atm for 3 minutes.  Follow-up imaging demonstrated complete resolution of the blush with excellent flow through the femoral artery.  No evidence of extravasation.  Attention's were then turned to the left where  StarClose device was deployed in the usual fashion without difficulty.    Findings:  The abdominal aorta is opacified with a bolus injection contrast.  There is diffuse calcifications but there are no hemodynamically significant stenoses.  The aortic bifurcation demonstrates a bulky coral reef-like lesion in the proximal right common femoral.  In both RAO and LAO views this is identified and appears to correlate with between an 80 and 90% stenosis.  On the left there are tandem lesions in the distal common which appear to be approximately 70% stenoses.  The external iliac arteries are widely patent bilaterally.  Internal iliac arteries are patent bilaterally.  Of note there is a renal transplant noted with its arterial origin in the mid external iliac artery.  Initial views suggested there was some narrowing at the anastomotic level however in a steep LAO projection with some caudal angle added there does not appear to be any abnormality of the anastomosis.  The right common femoral is widely patent as is the profunda femoris.  The superficial femoral artery appears to be patent although diffusely diseased in its proximal half.  At [facility] canal previously placed stent is identified and the stent occludes at its origin.  There is reconstitution of the above-knee popliteal within the stented segment and the remaining portion of the popliteal distally is patent.  The trifurcation is patent although there is a 70 to 80% stenosis at the origin of the anterior tibial beyond this lesion the anterior tibial is widely patent down to the foot.  Tibioperoneal trunk posterior tibial and peroneal are all patent down to the foot without hemodynamically significant stenosis.   Following placement of the iliac stents there is now wide patency with less than 5% residual stenosis with rapid flow through the aortic bifurcation bilaterally.  Summary:  Successful reconstruction of the distal aorta and bilateral iliac  arteries  Disposition: Patient was taken to the recovery room in stable condition having tolerated the procedure well.  Belenda Cruise Schnier 02/08/2021,3:56 PM

## 2021-02-09 ENCOUNTER — Encounter: Payer: Self-pay | Admitting: Vascular Surgery

## 2021-02-09 NOTE — OR Nursing (Signed)
PAD devices removed without incident and replaced with gauze and tegaderm. PT able to void before d/c.

## 2021-02-20 NOTE — Progress Notes (Signed)
MRN : [id number]  Cindy Fischer is a 75 y.o. (Jan 01, 1946) female who presents with chief complaint of No chief complaint on file. Marland Kitchen  History of Present Illness:   The patient returns to the office for followup and review status post angiogram with intervention 02/08/2021.  Percutaneous transluminal angioplasty and stent placement bilateral common iliac arteries; "kissing balloon" technique.  She does note fairly severe bruising of the groin.  The patient notes no change in the lower extremity symptoms. No interval shortening of the patient's claudication distance or rest pain symptoms.  No new ulcers or wounds have occurred since the last visit.  There have been no significant changes to the patient's overall health care.    No outpatient medications have been marked as taking for the 02/21/21 encounter (Appointment) with Delana Meyer, Dolores Lory, MD.    Past Medical History:  Diagnosis Date  . Cancer ([place]) 2016   skin (nose)  . Diabetes mellitus without complication ([place])   . Hypothyroidism   . Pneumonia   . Renal disorder     Past Surgical History:  Procedure Laterality Date  . CATARACT EXTRACTION, BILATERAL    . COLONOSCOPY  12/02/2010  . COLONOSCOPY WITH PROPOFOL N/A 09/04/2016   Procedure: COLONOSCOPY WITH PROPOFOL;  Surgeon: Christene Lye, MD;  Location: [facility] END[facility]COPY;  Service: Endoscopy;  Laterality: N/A;  . EYE SURGERY    . INSERTION OF DIALYSIS CATHETER  2014   removed 2014 (only had in for about 8 weeks)  . laser surgery on eyes  1999  . LOWER EXTREMITY ANGIOGRAPHY Right 10/16/2018   Procedure: LOWER EXTREMITY ANGIOGRAPHY;  Surgeon: Katha Cabal, MD;  Location: [facility];  Service: Cardiovascular;  Laterality: Right;  . LOWER EXTREMITY ANGIOGRAPHY Right 02/08/2021   Procedure: LOWER EXTREMITY ANGIOGRAPHY;  Surgeon: Katha Cabal, MD;  Location: [facility];  Service: Cardiovascular;  Laterality: Right;  . 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 SPINE SURGERY  11/10/2021  . TONSILLECTOMY    . TUBAL LIGATION      Social History Social History   Tobacco Use  . Smoking status: Former Smoker    Types: Cigarettes  . Smokeless tobacco: Never Used  Vaping Use  . Vaping Use: Never used  Substance Use Topics  . Alcohol use: No  . Drug use: No    Family History Family History  Problem Relation Age of Onset  . Multiple myeloma Mother   . Diabetes Father   . Heart attack Father   . Breast cancer Neg Hx     Allergies  Allergen Reactions  . Latex Itching  . Oxycodone-Acetaminophen Nausea And Vomiting  . Penicillins Rash    Has patient had a PCN reaction causing immediate rash, facial/tongue/throat swelling, SOB or lightheadedness with hypotension: Yes Has patient had a PCN reaction causing severe rash involving mucus membranes or skin necrosis: No Has patient had a PCN reaction that required hospitalization: No Has patient had a PCN reaction occurring within the last 10 years: No If all of the above answers are "NO", then may proceed with Cephalosporin use.      REVIEW OF SYSTEMS (Negative unless checked)  Constitutional: '[]' Weight loss  '[]' Fever  '[]' Chills Cardiac: '[]' Chest pain   '[]' Chest pressure   '[]' Palpitations   '[]' Shortness of breath when laying flat   '[]' Shortness of breath with exertion. Vascular:  '[x]' Pain in legs with walking   '[x]' Pain in legs at rest  '[]' History of DVT   '[]' Phlebitis   '[]'   Swelling in legs   '[]' Varicose veins   '[x]' Non-healing ulcers Pulmonary:   '[]' Uses home oxygen   '[]' Productive cough   '[]' Hemoptysis   '[]' Wheeze  '[]' COPD   '[]' Asthma Neurologic:  '[]' Dizziness   '[]' Seizures   '[]' History of stroke   '[]' History of TIA  '[]' Aphasia   '[]' Vissual changes   '[]' Weakness or numbness in arm   '[]' Weakness or numbness in leg Musculoskeletal:   '[]' Joint swelling   '[x]' Joint pain   '[]' Low back pain Hematologic:  '[]' Easy bruising  '[]' Easy bleeding   '[]' Hypercoagulable state   '[]' Anemic Gastrointestinal:   '[]' Diarrhea   '[]' Vomiting  '[]' Gastroesophageal reflux/heartburn   '[]' Difficulty swallowing. Genitourinary:  '[]' Chronic kidney disease   '[]' Difficult urination  '[]' Frequent urination   '[]' Blood in urine Skin:  '[]' Rashes   '[x]' Ulcers  Psychological:  '[]' History of anxiety   '[]'  History of major depression.  Physical Examination  There were no vitals filed for this visit. There is no height or weight on file to calculate BMI. Gen: WD/WN, NAD Head: Abbott/AT, No temporalis wasting.  Ear/Nose/Throat: Hearing grossly intact, nares w/o erythema or drainage Eyes: PER, EOMI, sclera nonicteric.  Neck: Supple, no large masses.   Pulmonary:  Good air movement, no audible wheezing bilaterally, no use of accessory muscles.  Cardiac: RRR, no JVD Vascular:  Ulcer right foot Vessel Right Left  Radial Palpable Palpable  PT Not Palpable Not Palpable  DP Not Palpable Not Palpable  Gastrointestinal: Non-distended. No guarding/no peritoneal signs.  Musculoskeletal: M/S 5/5 throughout.  No deformity or atrophy.  Neurologic: CN 2-12 intact. Symmetrical.  Speech is fluent. Motor exam as listed above. Psychiatric: Judgment intact, Mood & affect appropriate for pt's clinical situation. Dermatologic: No rashes noted.  No changes consistent with cellulitis.  CBC Lab Results  Component Value Date   WBC 8.9 12/28/2020   HGB 15.0 12/28/2020   HCT 44.4 12/28/2020   MCV 88.1 12/28/2020   PLT 714 (H) 12/28/2020    BMET    Component Value Date/Time   NA 132 (L) 12/28/2020 0808   NA 138 03/29/2015 0918   K 4.5 12/28/2020 0808   K 4.5 03/29/2015 0918   CL 95 (L) 12/28/2020 0808   CL 104 03/29/2015 0918   CO2 26 12/28/2020 0808   CO2 26 03/29/2015 0918   GLUCOSE 106 (H) 12/28/2020 0808   GLUCOSE 187 (H) 03/29/2015 0918   BUN 33 (H) 12/28/2020 0808   BUN 10 03/29/2015 0918   CREATININE 1.05 (H) 12/28/2020 0808   CREATININE 0.80 03/29/2015 0918   CALCIUM 9.5 12/28/2020 0808   CALCIUM 9.1 03/29/2015 0918   GFRNONAA 55  (L) 12/28/2020 0808   GFRNONAA >60 03/29/2015 0918   GFRAA >60 09/24/2020 0836   GFRAA >60 03/29/2015 0918   CrCl cannot be calculated (Patient's most recent lab result is older than the maximum 21 days allowed.).  COAG No results found for: INR, PROTIME  Radiology PERIPHERAL VASCULAR CATHETERIZATION  Result Date: 02/08/2021 See Op Note  VAS [place] ABI WITH/WO TBI  Result Date: 02/03/2021 LOWER EXTREMITY DOPPLER STUDY Indications: Claudication, and peripheral artery disease.  Vascular Interventions: 10/16/2018:PTA and Stent placement in the Rt SFA and                         Popliteal Artery. Comparison Study: 03/22/2020 Performing Technologist: Almira Coaster RVS  Examination Guidelines: A complete evaluation includes at minimum, Doppler waveform signals and systolic blood pressure reading at the level of bilateral brachial, anterior tibial, and posterior  tibial arteries, when vessel segments are accessible. Bilateral testing is considered an integral part of a complete examination. Photoelectric Plethysmograph (PPG) waveforms and toe systolic pressure readings are included as required and additional duplex testing as needed. Limited examinations for reoccurring indications may be performed as noted.  ABI Findings: +---------+------------------+-----+----------+----------+ Right    Rt Pressure (mmHg)IndexWaveform  Comment    +---------+------------------+-----+----------+----------+ Brachial                                  HDA Device +---------+------------------+-----+----------+----------+ ATA      250               1.85 monophasicNC         +---------+------------------+-----+----------+----------+ PTA      245               1.81 monophasic           +---------+------------------+-----+----------+----------+ Great Toe0                 0.00 Absent               +---------+------------------+-----+----------+----------+  +---------+------------------+-----+--------+-------+ Left     Lt Pressure (mmHg)IndexWaveformComment +---------+------------------+-----+--------+-------+ Brachial 135                                    +---------+------------------+-----+--------+-------+ ATA      250               1.85 biphasicNC      +---------+------------------+-----+--------+-------+ PTA      250               1.85 biphasicNC      +---------+------------------+-----+--------+-------+ Great Toe244               1.81 Normal          +---------+------------------+-----+--------+-------+ +-------+-----------+-----------+------------+------------+ ABI/TBIToday's ABIToday's TBIPrevious ABIPrevious TBI +-------+-----------+-----------+------------+------------+ Right  >1.0 [place]    0          >1.0 [place]     .89          +-------+-----------+-----------+------------+------------+ Left   >1.0 [place]    1.81 [place]    >1.0 [place]     .96          +-------+-----------+-----------+------------+------------+ Bilateral ABIs appear essentially unchanged compared to prior study on 03/22/2020. Right TBIs appear decreased compared to prior study on 03/22/2020.  Summary: Right: Resting right ankle-brachial index indicates noncompressible right lower extremity arteries. The right toe-brachial index is abnormal. Left: Resting left ankle-brachial index indicates noncompressible left lower extremity arteries. The left toe-brachial index is normal.  *See table(s) above for measurements and observations.  Electronically signed by Hortencia Pilar MD on 02/03/2021 at 5:05:19 PM.    Final    VAS [place] LOWER EXTREMITY ARTERIAL DUPLEX  Result Date: 02/03/2021 LOWER EXTREMITY ARTERIAL DUPLEX STUDY Indications: Claudication, and peripheral artery disease.  Vascular Interventions: 10/16/2018: PTA and Stent of the Right SFA and Popliteal                         Artery. Current ABI:            Rt >1.0 [place], Lt >1.0 [place] Comparison Study: 03/22/2020  Performing Technologist: Almira Coaster RVS  Examination Guidelines: A complete evaluation includes B-mode imaging, spectral Doppler, color Doppler, and power Doppler as needed of all accessible  portions of each vessel. Bilateral testing is considered an integral part of a complete examination. Limited examinations for reoccurring indications may be performed as noted.  +-----------+--------+-----+--------+----------+--------+ RIGHT      PSV cm/sRatioStenosisWaveform  Comments +-----------+--------+-----+--------+----------+--------+ CFA Distal 40                   triphasic          +-----------+--------+-----+--------+----------+--------+ DFA        69                   monophasic         +-----------+--------+-----+--------+----------+--------+ SFA Prox   38                   monophasic         +-----------+--------+-----+--------+----------+--------+ SFA Mid    27                   monophasic         +-----------+--------+-----+--------+----------+--------+ SFA Distal 0                    Absent             +-----------+--------+-----+--------+----------+--------+ POP Prox   0                    Absent             +-----------+--------+-----+--------+----------+--------+ ATA Distal 28                   monophasic         +-----------+--------+-----+--------+----------+--------+ PTA Distal 15                   monophasic         +-----------+--------+-----+--------+----------+--------+ PERO Distal0                    absent             +-----------+--------+-----+--------+----------+--------+  +-----------+--------+-----+--------+---------+--------+ LEFT       PSV cm/sRatioStenosisWaveform Comments +-----------+--------+-----+--------+---------+--------+ CFA Distal 53                   biphasic          +-----------+--------+-----+--------+---------+--------+ DFA        42                   biphasic           +-----------+--------+-----+--------+---------+--------+ SFA Prox   70                   biphasic          +-----------+--------+-----+--------+---------+--------+ SFA Mid    47                   biphasic          +-----------+--------+-----+--------+---------+--------+ SFA Distal 57                   biphasic          +-----------+--------+-----+--------+---------+--------+ POP Distal 117                  biphasic          +-----------+--------+-----+--------+---------+--------+ ATA Distal 90                   triphasic         +-----------+--------+-----+--------+---------+--------+ PTA Distal 59  triphasic         +-----------+--------+-----+--------+---------+--------+ PERO Distal0                    Absent            +-----------+--------+-----+--------+---------+--------+  Summary: Right: Imaging and Waveforms obtained throughout in the Right Lower Extremity. The SFA Distal segment and Popliteal Artery appear to be occluded. Left: Imaging and Waveforms obtained in the Left Lower Extremity. Biphasic Waveforms obtained predominantly in the Left LowerExtremity.  See table(s) above for measurements and observations. Electronically signed by Hortencia Pilar MD on 02/03/2021 at 5:05:25 PM.    Final      Assessment/Plan 1. Atherosclerosis of native artery of both lower extremities with intermittent claudication (HCC)  Recommend:  The patient has evidence of severe atherosclerotic changes of both lower extremities associated with ulceration and tissue loss of the right foot.  This represents a limb threatening ischemia and places the patient at the risk for right limb loss.  Patient should undergo angiography of the right lower extremity with the hope for intervention for limb salvage.  The risks and benefits as well as the alternative therapies was discussed in detail with the patient.  All questions were answered.  Patient agrees to proceed  with right leg angiography.  The patient will follow up with me in the office after the procedure.    2. Primary hypertension Continue antihypertensive medications as already ordered, these medications have been reviewed and there are no changes at this time.   3. Type 2 diabetes mellitus with other circulatory complication, with long-term current use of insulin (HCC) Continue hypoglycemic medications as already ordered, these medications have been reviewed and there are no changes at this time.  Hgb A1C to be monitored as already arranged by primary service   4. Mixed hyperlipidemia Continue statin as ordered and reviewed, no changes at this time    Hortencia Pilar, MD  02/20/2021 4:48 PM

## 2021-02-20 NOTE — H&P (View-Only) (Signed)
MRN : [id number]  Cindy Fischer is a 75 y.o. (12-31-1945) female who presents with chief complaint of No chief complaint on file. Marland Kitchen  History of Present Illness:   The patient returns to the office for followup and review status post angiogram with intervention 02/08/2021.  Percutaneous transluminal angioplasty and stent placement bilateral common iliac arteries; "kissing balloon" technique.  She does note fairly severe bruising of the groin.  The patient notes no change in the lower extremity symptoms. No interval shortening of the patient's claudication distance or rest pain symptoms.  No new ulcers or wounds have occurred since the last visit.  There have been no significant changes to the patient's overall health care.    No outpatient medications have been marked as taking for the 02/21/21 encounter (Appointment) with Delana Meyer, Dolores Lory, MD.    Past Medical History:  Diagnosis Date  . Cancer ([place]) 2016   skin (nose)  . Diabetes mellitus without complication ([place])   . Hypothyroidism   . Pneumonia   . Renal disorder     Past Surgical History:  Procedure Laterality Date  . CATARACT EXTRACTION, BILATERAL    . COLONOSCOPY  12/02/2010  . COLONOSCOPY WITH PROPOFOL N/A 09/04/2016   Procedure: COLONOSCOPY WITH PROPOFOL;  Surgeon: Christene Lye, MD;  Location: [facility] ENDOSCOPY;  Service: Endoscopy;  Laterality: N/A;  . EYE SURGERY    . INSERTION OF DIALYSIS CATHETER  2014   removed 2014 (only had in for about 8 weeks)  . laser surgery on eyes  1999  . LOWER EXTREMITY ANGIOGRAPHY Right 10/16/2018   Procedure: LOWER EXTREMITY ANGIOGRAPHY;  Surgeon: Katha Cabal, MD;  Location: [place] [facility];  Service: Cardiovascular;  Laterality: Right;  . LOWER EXTREMITY ANGIOGRAPHY Right 02/08/2021   Procedure: LOWER EXTREMITY ANGIOGRAPHY;  Surgeon: Katha Cabal, MD;  Location: [place] [facility];  Service: Cardiovascular;  Laterality: Right;  . 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 SPINE SURGERY  11/10/2021  . TONSILLECTOMY    . TUBAL LIGATION      Social History Social History   Tobacco Use  . Smoking status: Former Smoker    Types: Cigarettes  . Smokeless tobacco: Never Used  Vaping Use  . Vaping Use: Never used  Substance Use Topics  . Alcohol use: No  . Drug use: No    Family History Family History  Problem Relation Age of Onset  . Multiple myeloma Mother   . Diabetes Father   . Heart attack Father   . Breast cancer Neg Hx     Allergies  Allergen Reactions  . Latex Itching  . Oxycodone-Acetaminophen Nausea And Vomiting  . Penicillins Rash    Has patient had a PCN reaction causing immediate rash, facial/tongue/throat swelling, SOB or lightheadedness with hypotension: Yes Has patient had a PCN reaction causing severe rash involving mucus membranes or skin necrosis: No Has patient had a PCN reaction that required hospitalization: No Has patient had a PCN reaction occurring within the last 10 years: No If all of the above answers are "NO", then may proceed with Cephalosporin use.      REVIEW OF SYSTEMS (Negative unless checked)  Constitutional: '[]' Weight loss  '[]' Fever  '[]' Chills Cardiac: '[]' Chest pain   '[]' Chest pressure   '[]' Palpitations   '[]' Shortness of breath when laying flat   '[]' Shortness of breath with exertion. Vascular:  '[x]' Pain in legs with walking   '[x]' Pain in legs at rest  '[]' History of DVT   '[]' Phlebitis   '[]'   Swelling in legs   '[]' Varicose veins   '[x]' Non-healing ulcers Pulmonary:   '[]' Uses home oxygen   '[]' Productive cough   '[]' Hemoptysis   '[]' Wheeze  '[]' COPD   '[]' Asthma Neurologic:  '[]' Dizziness   '[]' Seizures   '[]' History of stroke   '[]' History of TIA  '[]' Aphasia   '[]' Vissual changes   '[]' Weakness or numbness in arm   '[]' Weakness or numbness in leg Musculoskeletal:   '[]' Joint swelling   '[x]' Joint pain   '[]' Low back pain Hematologic:  '[]' Easy bruising  '[]' Easy bleeding   '[]' Hypercoagulable state   '[]' Anemic Gastrointestinal:   '[]' Diarrhea   '[]' Vomiting  '[]' Gastroesophageal reflux/heartburn   '[]' Difficulty swallowing. Genitourinary:  '[]' Chronic kidney disease   '[]' Difficult urination  '[]' Frequent urination   '[]' Blood in urine Skin:  '[]' Rashes   '[x]' Ulcers  Psychological:  '[]' History of anxiety   '[]'  History of major depression.  Physical Examination  There were no vitals filed for this visit. There is no height or weight on file to calculate BMI. Gen: WD/WN, NAD Head: [place]/AT, No temporalis wasting.  Ear/Nose/Throat: Hearing grossly intact, nares w/o erythema or drainage Eyes: PER, EOMI, sclera nonicteric.  Neck: Supple, no large masses.   Pulmonary:  Good air movement, no audible wheezing bilaterally, no use of accessory muscles.  Cardiac: RRR, no JVD Vascular:  Ulcer right foot Vessel Right Left  Radial Palpable Palpable  PT Not Palpable Not Palpable  DP Not Palpable Not Palpable  Gastrointestinal: Non-distended. No guarding/no peritoneal signs.  Musculoskeletal: M/S 5/5 throughout.  No deformity or atrophy.  Neurologic: CN 2-12 intact. Symmetrical.  Speech is fluent. Motor exam as listed above. Psychiatric: Judgment intact, Mood & affect appropriate for pt's clinical situation. Dermatologic: No rashes noted.  No changes consistent with cellulitis.  CBC Lab Results  Component Value Date   WBC 8.9 12/28/2020   HGB 15.0 12/28/2020   HCT 44.4 12/28/2020   MCV 88.1 12/28/2020   PLT 714 (H) 12/28/2020    BMET    Component Value Date/Time   NA 132 (L) 12/28/2020 0808   NA 138 03/29/2015 0918   K 4.5 12/28/2020 0808   K 4.5 03/29/2015 0918   CL 95 (L) 12/28/2020 0808   CL 104 03/29/2015 0918   CO2 26 12/28/2020 0808   CO2 26 03/29/2015 0918   GLUCOSE 106 (H) 12/28/2020 0808   GLUCOSE 187 (H) 03/29/2015 0918   BUN 33 (H) 12/28/2020 0808   BUN 10 03/29/2015 0918   CREATININE 1.05 (H) 12/28/2020 0808   CREATININE 0.80 03/29/2015 0918   CALCIUM 9.5 12/28/2020 0808   CALCIUM 9.1 03/29/2015 0918   GFRNONAA 55  (L) 12/28/2020 0808   GFRNONAA >60 03/29/2015 0918   GFRAA >60 09/24/2020 0836   GFRAA >60 03/29/2015 0918   CrCl cannot be calculated (Patient's most recent lab result is older than the maximum 21 days allowed.).  COAG No results found for: INR, PROTIME  Radiology PERIPHERAL VASCULAR CATHETERIZATION  Result Date: 02/08/2021 See Op Note  VAS [place] ABI WITH/WO TBI  Result Date: 02/03/2021 LOWER EXTREMITY DOPPLER STUDY Indications: Claudication, and peripheral artery disease.  Vascular Interventions: 10/16/2018:PTA and Stent placement in the Rt SFA and                         Popliteal Artery. Comparison Study: 03/22/2020 Performing Technologist: Almira Coaster RVS  Examination Guidelines: A complete evaluation includes at minimum, Doppler waveform signals and systolic blood pressure reading at the level of bilateral brachial, anterior tibial, and posterior  tibial arteries, when vessel segments are accessible. Bilateral testing is considered an integral part of a complete examination. Photoelectric Plethysmograph (PPG) waveforms and toe systolic pressure readings are included as required and additional duplex testing as needed. Limited examinations for reoccurring indications may be performed as noted.  ABI Findings: +---------+------------------+-----+----------+----------+ Right    Rt Pressure (mmHg)IndexWaveform  Comment    +---------+------------------+-----+----------+----------+ Brachial                                  HDA Device +---------+------------------+-----+----------+----------+ ATA      250               1.85 monophasicNC         +---------+------------------+-----+----------+----------+ PTA      245               1.81 monophasic           +---------+------------------+-----+----------+----------+ Great Toe0                 0.00 Absent               +---------+------------------+-----+----------+----------+  +---------+------------------+-----+--------+-------+ Left     Lt Pressure (mmHg)IndexWaveformComment +---------+------------------+-----+--------+-------+ Brachial 135                                    +---------+------------------+-----+--------+-------+ ATA      250               1.85 biphasicNC      +---------+------------------+-----+--------+-------+ PTA      250               1.85 biphasicNC      +---------+------------------+-----+--------+-------+ Great Toe244               1.81 Normal          +---------+------------------+-----+--------+-------+ +-------+-----------+-----------+------------+------------+ ABI/TBIToday's ABIToday's TBIPrevious ABIPrevious TBI +-------+-----------+-----------+------------+------------+ Right  >1.0 [place]    0          >1.0 [place]     .89          +-------+-----------+-----------+------------+------------+ Left   >1.0 [place]    1.81 [place]    >1.0 [place]     .96          +-------+-----------+-----------+------------+------------+ Bilateral ABIs appear essentially unchanged compared to prior study on 03/22/2020. Right TBIs appear decreased compared to prior study on 03/22/2020.  Summary: Right: Resting right ankle-brachial index indicates noncompressible right lower extremity arteries. The right toe-brachial index is abnormal. Left: Resting left ankle-brachial index indicates noncompressible left lower extremity arteries. The left toe-brachial index is normal.  *See table(s) above for measurements and observations.  Electronically signed by Hortencia Pilar MD on 02/03/2021 at 5:05:19 PM.    Final    VAS [place] LOWER EXTREMITY ARTERIAL DUPLEX  Result Date: 02/03/2021 LOWER EXTREMITY ARTERIAL DUPLEX STUDY Indications: Claudication, and peripheral artery disease.  Vascular Interventions: 10/16/2018: PTA and Stent of the Right SFA and Popliteal                         Artery. Current ABI:            Rt >1.0 [place], Lt >1.0 [place] Comparison Study: 03/22/2020  Performing Technologist: Almira Coaster RVS  Examination Guidelines: A complete evaluation includes B-mode imaging, spectral Doppler, color Doppler, and power Doppler as needed of all accessible  portions of each vessel. Bilateral testing is considered an integral part of a complete examination. Limited examinations for reoccurring indications may be performed as noted.  +-----------+--------+-----+--------+----------+--------+ RIGHT      PSV cm/sRatioStenosisWaveform  Comments +-----------+--------+-----+--------+----------+--------+ CFA Distal 40                   triphasic          +-----------+--------+-----+--------+----------+--------+ DFA        69                   monophasic         +-----------+--------+-----+--------+----------+--------+ SFA Prox   38                   monophasic         +-----------+--------+-----+--------+----------+--------+ SFA Mid    27                   monophasic         +-----------+--------+-----+--------+----------+--------+ SFA Distal 0                    Absent             +-----------+--------+-----+--------+----------+--------+ POP Prox   0                    Absent             +-----------+--------+-----+--------+----------+--------+ ATA Distal 28                   monophasic         +-----------+--------+-----+--------+----------+--------+ PTA Distal 15                   monophasic         +-----------+--------+-----+--------+----------+--------+ PERO Distal0                    absent             +-----------+--------+-----+--------+----------+--------+  +-----------+--------+-----+--------+---------+--------+ LEFT       PSV cm/sRatioStenosisWaveform Comments +-----------+--------+-----+--------+---------+--------+ CFA Distal 53                   biphasic          +-----------+--------+-----+--------+---------+--------+ DFA        42                   biphasic           +-----------+--------+-----+--------+---------+--------+ SFA Prox   70                   biphasic          +-----------+--------+-----+--------+---------+--------+ SFA Mid    47                   biphasic          +-----------+--------+-----+--------+---------+--------+ SFA Distal 57                   biphasic          +-----------+--------+-----+--------+---------+--------+ POP Distal 117                  biphasic          +-----------+--------+-----+--------+---------+--------+ ATA Distal 90                   triphasic         +-----------+--------+-----+--------+---------+--------+ PTA Distal 59  triphasic         +-----------+--------+-----+--------+---------+--------+ PERO Distal0                    Absent            +-----------+--------+-----+--------+---------+--------+  Summary: Right: Imaging and Waveforms obtained throughout in the Right Lower Extremity. The SFA Distal segment and Popliteal Artery appear to be occluded. Left: Imaging and Waveforms obtained in the Left Lower Extremity. Biphasic Waveforms obtained predominantly in the Left LowerExtremity.  See table(s) above for measurements and observations. Electronically signed by Hortencia Pilar MD on 02/03/2021 at 5:05:25 PM.    Final      Assessment/Plan 1. Atherosclerosis of native artery of both lower extremities with intermittent claudication (HCC)  Recommend:  The patient has evidence of severe atherosclerotic changes of both lower extremities associated with ulceration and tissue loss of the right foot.  This represents a limb threatening ischemia and places the patient at the risk for right limb loss.  Patient should undergo angiography of the right lower extremity with the hope for intervention for limb salvage.  The risks and benefits as well as the alternative therapies was discussed in detail with the patient.  All questions were answered.  Patient agrees to proceed  with right leg angiography.  The patient will follow up with me in the office after the procedure.    2. Primary hypertension Continue antihypertensive medications as already ordered, these medications have been reviewed and there are no changes at this time.   3. Type 2 diabetes mellitus with other circulatory complication, with long-term current use of insulin (HCC) Continue hypoglycemic medications as already ordered, these medications have been reviewed and there are no changes at this time.  Hgb A1C to be monitored as already arranged by primary service   4. Mixed hyperlipidemia Continue statin as ordered and reviewed, no changes at this time    Hortencia Pilar, MD  02/20/2021 4:48 PM

## 2021-02-21 ENCOUNTER — Encounter (INDEPENDENT_AMBULATORY_CARE_PROVIDER_SITE_OTHER): Payer: Self-pay | Admitting: Vascular Surgery

## 2021-02-21 ENCOUNTER — Ambulatory Visit (INDEPENDENT_AMBULATORY_CARE_PROVIDER_SITE_OTHER): Payer: Medicare Other | Admitting: Vascular Surgery

## 2021-02-21 ENCOUNTER — Other Ambulatory Visit: Payer: Self-pay

## 2021-02-21 ENCOUNTER — Telehealth (INDEPENDENT_AMBULATORY_CARE_PROVIDER_SITE_OTHER): Payer: Self-pay

## 2021-02-21 VITALS — BP 142/89 | HR 75 | Ht 62.0 in | Wt 157.0 lb

## 2021-02-21 DIAGNOSIS — I70213 Atherosclerosis of native arteries of extremities with intermittent claudication, bilateral legs: Secondary | ICD-10-CM

## 2021-02-21 DIAGNOSIS — Z794 Long term (current) use of insulin: Secondary | ICD-10-CM

## 2021-02-21 DIAGNOSIS — E782 Mixed hyperlipidemia: Secondary | ICD-10-CM | POA: Diagnosis not present

## 2021-02-21 DIAGNOSIS — I10 Essential (primary) hypertension: Secondary | ICD-10-CM | POA: Diagnosis not present

## 2021-02-21 DIAGNOSIS — E1159 Type 2 diabetes mellitus with other circulatory complications: Secondary | ICD-10-CM

## 2021-02-21 NOTE — Telephone Encounter (Signed)
Spoke with the patient, she is scheduled with Dr. Delana Meyer for a right leg angio on 03/02/21 with a 9:00 am arrival time to the [facility]. Covid testing on 02/25/21 between 8-1 pm at the [facility]. Pre-procedure instructions were discussed and will be mailed.

## 2021-02-22 ENCOUNTER — Ambulatory Visit: Admit: 2021-02-22 | Discharge: 2021-02-23 | Payer: MEDICARE

## 2021-02-22 ENCOUNTER — Other Ambulatory Visit
Admission: RE | Admit: 2021-02-22 | Discharge: 2021-02-22 | Disposition: A | Discharge status: Home / Self Care | Payer: Medicare Other | Attending: Nephrology | Admitting: Nephrology

## 2021-02-22 DIAGNOSIS — I7090 Unspecified atherosclerosis: Principal | ICD-10-CM

## 2021-02-22 DIAGNOSIS — R600 Localized edema: Principal | ICD-10-CM

## 2021-02-22 DIAGNOSIS — D899 Disorder involving the immune mechanism, unspecified: Secondary | ICD-10-CM | POA: Insufficient documentation

## 2021-02-22 DIAGNOSIS — D631 Anemia in chronic kidney disease: Secondary | ICD-10-CM | POA: Insufficient documentation

## 2021-02-22 DIAGNOSIS — Z79899 Other long term (current) drug therapy: Secondary | ICD-10-CM | POA: Diagnosis not present

## 2021-02-22 DIAGNOSIS — E1129 Type 2 diabetes mellitus with other diabetic kidney complication: Secondary | ICD-10-CM | POA: Insufficient documentation

## 2021-02-22 DIAGNOSIS — Z789 Other specified health status: Secondary | ICD-10-CM | POA: Diagnosis not present

## 2021-02-22 DIAGNOSIS — Z114 Encounter for screening for human immunodeficiency virus [HIV]: Secondary | ICD-10-CM | POA: Insufficient documentation

## 2021-02-22 DIAGNOSIS — Z9483 Pancreas transplant status: Secondary | ICD-10-CM | POA: Diagnosis not present

## 2021-02-22 DIAGNOSIS — Z940 Kidney transplant status: Secondary | ICD-10-CM | POA: Insufficient documentation

## 2021-02-22 DIAGNOSIS — B259 Cytomegaloviral disease, unspecified: Secondary | ICD-10-CM | POA: Diagnosis not present

## 2021-02-22 DIAGNOSIS — Z09 Encounter for follow-up examination after completed treatment for conditions other than malignant neoplasm: Secondary | ICD-10-CM | POA: Insufficient documentation

## 2021-02-22 DIAGNOSIS — N390 Urinary tract infection, site not specified: Secondary | ICD-10-CM | POA: Diagnosis not present

## 2021-02-22 DIAGNOSIS — E559 Vitamin D deficiency, unspecified: Secondary | ICD-10-CM | POA: Insufficient documentation

## 2021-02-22 LAB — CBC WITH DIFFERENTIAL/PLATELET
Abs Immature Granulocytes: 0.2 10*3/uL — ABNORMAL HIGH (ref 0.00–0.07)
Basophils Absolute: 0 10*3/uL (ref 0.0–0.1)
Basophils Relative: 0 %
Eosinophils Absolute: 0.1 10*3/uL (ref 0.0–0.5)
Eosinophils Relative: 1 %
HCT: 41.9 % (ref 36.0–46.0)
Hemoglobin: 13.4 g/dL (ref 12.0–15.0)
Immature Granulocytes: 2 %
Lymphocytes Relative: 20 %
Lymphs Abs: 2 10*3/uL (ref 0.7–4.0)
MCH: 29.4 pg (ref 26.0–34.0)
MCHC: 32 g/dL (ref 30.0–36.0)
MCV: 91.9 fL (ref 80.0–100.0)
Monocytes Absolute: 1.1 10*3/uL — ABNORMAL HIGH (ref 0.1–1.0)
Monocytes Relative: 12 %
Neutro Abs: 6.4 10*3/uL (ref 1.7–7.7)
Neutrophils Relative %: 65 %
Platelets: 791 10*3/uL — ABNORMAL HIGH (ref 150–400)
RBC: 4.56 MIL/uL (ref 3.87–5.11)
RDW: 16.6 % — ABNORMAL HIGH (ref 11.5–15.5)
WBC: 9.8 10*3/uL (ref 4.0–10.5)
nRBC: 0 % (ref 0.0–0.2)

## 2021-02-22 LAB — BASIC METABOLIC PANEL
Anion gap: 11 (ref 5–15)
BUN: 17 mg/dL (ref 8–23)
CO2: 25 mmol/L (ref 22–32)
Calcium: 9.3 mg/dL (ref 8.9–10.3)
Chloride: 99 mmol/L (ref 98–111)
Creatinine, Ser: 0.87 mg/dL (ref 0.44–1.00)
GFR, Estimated: >60 mL/min (ref >60)
Glucose, Bld: 115 mg/dL — ABNORMAL HIGH (ref 70–99)
Potassium: 4.2 mmol/L (ref 3.5–5.1)
Sodium: 135 mmol/L (ref 135–145)

## 2021-02-22 LAB — PHOSPHORUS: Phosphorus: 3.1 mg/dL (ref 2.5–4.6)

## 2021-02-22 LAB — MAGNESIUM: Magnesium: 2 mg/dL (ref 1.7–2.4)

## 2021-02-28 ENCOUNTER — Ambulatory Visit (INDEPENDENT_AMBULATORY_CARE_PROVIDER_SITE_OTHER): Payer: Medicare Other | Admitting: Podiatry

## 2021-02-28 ENCOUNTER — Other Ambulatory Visit
Admission: RE | Admit: 2021-02-28 | Discharge: 2021-02-28 | Disposition: A | Discharge status: Home / Self Care | Payer: Medicare Other | Source: Ambulatory Visit | Attending: Vascular Surgery | Admitting: Vascular Surgery

## 2021-02-28 ENCOUNTER — Other Ambulatory Visit: Payer: Self-pay

## 2021-02-28 ENCOUNTER — Encounter: Payer: Self-pay | Admitting: Podiatry

## 2021-02-28 DIAGNOSIS — L97511 Non-pressure chronic ulcer of other part of right foot limited to breakdown of skin: Secondary | ICD-10-CM | POA: Diagnosis not present

## 2021-02-28 DIAGNOSIS — Z01812 Encounter for preprocedural laboratory examination: Secondary | ICD-10-CM | POA: Diagnosis present

## 2021-02-28 DIAGNOSIS — E1159 Type 2 diabetes mellitus with other circulatory complications: Secondary | ICD-10-CM | POA: Diagnosis not present

## 2021-02-28 DIAGNOSIS — Z794 Long term (current) use of insulin: Secondary | ICD-10-CM

## 2021-02-28 DIAGNOSIS — Z20822 Contact with and (suspected) exposure to covid-19: Secondary | ICD-10-CM | POA: Insufficient documentation

## 2021-02-28 DIAGNOSIS — I739 Peripheral vascular disease, unspecified: Secondary | ICD-10-CM | POA: Diagnosis not present

## 2021-02-28 DIAGNOSIS — I96 Gangrene, not elsewhere classified: Secondary | ICD-10-CM | POA: Diagnosis not present

## 2021-02-28 LAB — SARS CORONAVIRUS 2 (TAT 6-24 HRS): SARS Coronavirus 2: NEGATIVE

## 2021-02-28 MED ORDER — TRAMADOL HCL 50 MG PO TABS: 50.0000 mg | ORAL_TABLET | Freq: Four times a day (QID) | ORAL | 0 refills | Status: AC | PRN

## 2021-02-28 MED ORDER — DOXYCYCLINE HYCLATE 100 MG PO TABS: 100.0000 mg | ORAL_TABLET | Freq: Two times a day (BID) | ORAL | 0 refills | Status: DC

## 2021-02-28 MED ORDER — SANTYL 250 UNIT/GM EX OINT: 1.0000 "application " | TOPICAL_OINTMENT | Freq: Every day | CUTANEOUS | 2 refills | Status: DC

## 2021-02-28 NOTE — Progress Notes (Addendum)
  Subjective:  Patient ID: Cindy Fischer, female    DOB: [birth year],  MRN: [id number]  Chief Complaint  Patient presents with  . Foot Pain    Patient presents today for wounds and cellulitis right foot.    75 y.o. female presents with the above complaint. History confirmed with patient.  She first Dr. Posey Pronto for what started as a blister on the hallux in January.  She recently underwent 1 angiography procedure with Dr. Delana Meyer and is going back for another procedure this Wednesday.  The wounds are very painful.  She has not been able to put anything on them because she has had difficulty with dressing changes.  Objective:  Physical Exam: blanching with elevation noted, dependent rubor present, DP absent right, gangrenous changes at right hallux, fourth and fifth toes and lateral right heel, normal sensory exam and PT absent right.     Assessment:   1. Type 2 diabetes mellitus with other circulatory complication, with long-term current use of insulin ([place])   2. Peripheral arterial disease ([place])   3. Gangrene ([place])   4. 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  Ulcer right foot -Discussed with her that the primary etiologies is likely her severe peripheral vascular disease.  Hopefully this will heal with local wound care after her next angiogram with Dr. Delana Meyer on Wednesday.  Hopefully he will to revascularize the foot to the level of the toes. -Prescription for Santyl sent to pharmacy she should use this daily I reviewed the use and instructions on this. -Dressed with antibiotic ointment, DSD.  Will order dressing supplies from [facility] -Continue off-loading with surgical shoe which was dispensed today. -Reviewed with her the pathophysiology and course of ischemic ulcerations and gangrene.  Discussed with her that it is possible that all of these heal after  revascularization or that none of them heal.  She is at high risk for limb loss and she understands this.  I like to reevaluate her after her procedure next week and see how things are proving.          Return in about 9 days (around 03/09/2021) for follow-up right foot wounds after vascular procedure.

## 2021-03-01 ENCOUNTER — Other Ambulatory Visit (INDEPENDENT_AMBULATORY_CARE_PROVIDER_SITE_OTHER): Payer: Self-pay | Admitting: Nurse Practitioner

## 2021-03-02 ENCOUNTER — Encounter
Admission: RE | Disposition: A | Discharge status: Home / Self Care | Payer: Self-pay | Source: Ambulatory Visit | Attending: Vascular Surgery

## 2021-03-02 ENCOUNTER — Ambulatory Visit
Admission: RE | Admit: 2021-03-02 | Discharge: 2021-03-02 | Disposition: A | Discharge status: Home / Self Care | Payer: Medicare Other | Source: Ambulatory Visit | Attending: Vascular Surgery | Admitting: Vascular Surgery

## 2021-03-02 ENCOUNTER — Other Ambulatory Visit: Payer: Self-pay

## 2021-03-02 ENCOUNTER — Encounter: Payer: Self-pay | Admitting: Vascular Surgery

## 2021-03-02 DIAGNOSIS — Z807 Family history of other malignant neoplasms of lymphoid, hematopoietic and related tissues: Secondary | ICD-10-CM | POA: Diagnosis not present

## 2021-03-02 DIAGNOSIS — Z833 Family history of diabetes mellitus: Secondary | ICD-10-CM | POA: Diagnosis not present

## 2021-03-02 DIAGNOSIS — Z885 Allergy status to narcotic agent status: Secondary | ICD-10-CM | POA: Diagnosis not present

## 2021-03-02 DIAGNOSIS — E11621 Type 2 diabetes mellitus with foot ulcer: Secondary | ICD-10-CM | POA: Insufficient documentation

## 2021-03-02 DIAGNOSIS — I70234 Atherosclerosis of native arteries of right leg with ulceration of heel and midfoot: Secondary | ICD-10-CM | POA: Diagnosis not present

## 2021-03-02 DIAGNOSIS — Z940 Kidney transplant status: Secondary | ICD-10-CM | POA: Insufficient documentation

## 2021-03-02 DIAGNOSIS — I70219 Atherosclerosis of native arteries of extremities with intermittent claudication, unspecified extremity: Secondary | ICD-10-CM

## 2021-03-02 DIAGNOSIS — Z880 Allergy status to penicillin: Secondary | ICD-10-CM | POA: Diagnosis not present

## 2021-03-02 DIAGNOSIS — Z8249 Family history of ischemic heart disease and other diseases of the circulatory system: Secondary | ICD-10-CM | POA: Diagnosis not present

## 2021-03-02 DIAGNOSIS — Z87891 Personal history of nicotine dependence: Secondary | ICD-10-CM | POA: Insufficient documentation

## 2021-03-02 DIAGNOSIS — L97519 Non-pressure chronic ulcer of other part of right foot with unspecified severity: Secondary | ICD-10-CM | POA: Insufficient documentation

## 2021-03-02 DIAGNOSIS — Z85828 Personal history of other malignant neoplasm of skin: Secondary | ICD-10-CM | POA: Diagnosis not present

## 2021-03-02 DIAGNOSIS — I10 Essential (primary) hypertension: Secondary | ICD-10-CM | POA: Diagnosis not present

## 2021-03-02 DIAGNOSIS — E782 Mixed hyperlipidemia: Secondary | ICD-10-CM | POA: Insufficient documentation

## 2021-03-02 DIAGNOSIS — Z91040 Latex allergy status: Secondary | ICD-10-CM | POA: Insufficient documentation

## 2021-03-02 DIAGNOSIS — Z905 Acquired absence of kidney: Secondary | ICD-10-CM | POA: Insufficient documentation

## 2021-03-02 DIAGNOSIS — E1159 Type 2 diabetes mellitus with other circulatory complications: Secondary | ICD-10-CM | POA: Insufficient documentation

## 2021-03-02 DIAGNOSIS — E1151 Type 2 diabetes mellitus with diabetic peripheral angiopathy without gangrene: Secondary | ICD-10-CM | POA: Insufficient documentation

## 2021-03-02 DIAGNOSIS — I70235 Atherosclerosis of native arteries of right leg with ulceration of other part of foot: Secondary | ICD-10-CM | POA: Diagnosis not present

## 2021-03-02 HISTORY — PX: LOWER EXTREMITY ANGIOGRAPHY: CATH118251

## 2021-03-02 LAB — GLUCOSE, CAPILLARY
Glucose-Capillary: 71 mg/dL (ref 70–99)
Glucose-Capillary: 51 mg/dL — ABNORMAL LOW (ref 70–99)
Glucose-Capillary: 53 mg/dL — ABNORMAL LOW (ref 70–99)
Glucose-Capillary: 83 mg/dL (ref 70–99)

## 2021-03-02 LAB — POCT ACTIVATED CLOTTING TIME: Activated Clotting Time: 255 seconds

## 2021-03-02 LAB — CREATININE, SERUM
Creatinine, Ser: 0.77 mg/dL (ref 0.44–1.00)
GFR, Estimated: >60 mL/min (ref >60)

## 2021-03-02 LAB — BUN: BUN: 18 mg/dL (ref 8–23)

## 2021-03-02 SURGERY — LOWER EXTREMITY ANGIOGRAPHY
Anesthesia: Moderate Sedation | Site: Leg Lower | Laterality: Right

## 2021-03-02 MED ORDER — FENTANYL CITRATE (PF) 100 MCG/2ML IJ SOLN: 12.5000 ug | Freq: Once | INTRAMUSCULAR | Status: DC | PRN

## 2021-03-02 MED ORDER — CLINDAMYCIN PHOSPHATE 300 MG/50ML IV SOLN: 300.0000 mg | Freq: Once | INTRAVENOUS | Status: DC

## 2021-03-02 MED ORDER — IODIXANOL 320 MG/ML IV SOLN
INTRAVENOUS | Status: DC | PRN
  Administered 2021-03-02: 70 mL

## 2021-03-02 MED ORDER — METHYLPREDNISOLONE SODIUM SUCC 125 MG IJ SOLR: 125.0000 mg | Freq: Once | INTRAMUSCULAR | Status: DC | PRN

## 2021-03-02 MED ORDER — HEPARIN SODIUM (PORCINE) 1000 UNIT/ML IJ SOLN
INTRAMUSCULAR | Status: DC | PRN
  Administered 2021-03-02: 5000 [IU] via INTRAVENOUS

## 2021-03-02 MED ORDER — FENTANYL CITRATE (PF) 100 MCG/2ML IJ SOLN
INTRAMUSCULAR | Status: DC | PRN
  Administered 2021-03-02: 50 ug via INTRAVENOUS

## 2021-03-02 MED ORDER — SODIUM CHLORIDE 0.9 % IV BOLUS: 250.0000 mL | Freq: Once | INTRAVENOUS | Status: DC

## 2021-03-02 MED ORDER — MORPHINE SULFATE (PF) 4 MG/ML IV SOLN: 2.0000 mg | INTRAVENOUS | Status: DC | PRN

## 2021-03-02 MED ORDER — SODIUM CHLORIDE 0.9 % IV SOLN: INTRAVENOUS | Status: DC

## 2021-03-02 MED ORDER — DIPHENHYDRAMINE HCL 50 MG/ML IJ SOLN: 50.0000 mg | Freq: Once | INTRAMUSCULAR | Status: DC | PRN

## 2021-03-02 MED ORDER — FENTANYL CITRATE (PF) 100 MCG/2ML IJ SOLN
INTRAMUSCULAR | Status: DC | PRN
  Administered 2021-03-02 (×2): 50 ug via INTRAVENOUS
  Administered 2021-03-02: 25 ug via INTRAVENOUS

## 2021-03-02 MED ORDER — ONDANSETRON HCL 4 MG/2ML IJ SOLN: 4.0000 mg | Freq: Four times a day (QID) | INTRAMUSCULAR | Status: DC | PRN

## 2021-03-02 MED ORDER — MIDAZOLAM HCL 5 MG/5ML IJ SOLN
INTRAMUSCULAR | Status: AC
  Filled 2021-03-02: qty 5

## 2021-03-02 MED ORDER — HEPARIN SODIUM (PORCINE) 1000 UNIT/ML IJ SOLN
INTRAMUSCULAR | Status: AC
  Filled 2021-03-02: qty 1

## 2021-03-02 MED ORDER — FAMOTIDINE 20 MG PO TABS: 40.0000 mg | ORAL_TABLET | Freq: Once | ORAL | Status: DC | PRN

## 2021-03-02 MED ORDER — FENTANYL CITRATE (PF) 100 MCG/2ML IJ SOLN
INTRAMUSCULAR | Status: AC
  Filled 2021-03-02: qty 2

## 2021-03-02 MED ORDER — CLINDAMYCIN PHOSPHATE 300 MG/50ML IV SOLN
INTRAVENOUS | Status: AC
  Filled 2021-03-02: qty 50

## 2021-03-02 MED ORDER — MIDAZOLAM HCL 2 MG/ML PO SYRP: 8.0000 mg | ORAL_SOLUTION | Freq: Once | ORAL | Status: DC | PRN

## 2021-03-02 MED ORDER — MIDAZOLAM HCL 2 MG/2ML IJ SOLN
INTRAMUSCULAR | Status: DC | PRN
  Administered 2021-03-02: 1 mg via INTRAVENOUS
  Administered 2021-03-02: 2 mg via INTRAVENOUS
  Administered 2021-03-02: 1 mg via INTRAVENOUS

## 2021-03-02 SURGICAL SUPPLY — 25 items
BALLN LUTONIX 018 4X150X130 (BALLOONS) ×2
BALLN LUTONIX 018 4X80X130 (BALLOONS) ×2
BALLN LUTONIX 018 5X220X130 (BALLOONS) ×2
BALLOON LUTONIX 018 4X150X130 (BALLOONS) ×1 IMPLANT
BALLOON LUTONIX 018 4X80X130 (BALLOONS) ×1 IMPLANT
BALLOON LUTONIX 018 5X220X130 (BALLOONS) ×1 IMPLANT
CANNULA 5F STIFF (CANNULA) ×2 IMPLANT
CATH ANGIO 5F PIGTAIL 65CM (CATHETERS) ×2 IMPLANT
CATH BEACON 5 .038 100 VERT TP (CATHETERS) ×2 IMPLANT
CATH ROTAREX 135 6FR (CATHETERS) ×2 IMPLANT
CATH TEMPO 5F RIM 65CM (CATHETERS) ×2 IMPLANT
COVER DRAPE FLUORO 36X44 (DRAPES) ×2 IMPLANT
COVER PROBE U/S 5X48 (MISCELLANEOUS) ×2 IMPLANT
DEVICE STARCLOSE SE CLOSURE (Vascular Products) ×2 IMPLANT
GLIDEWIRE ADV .035X260CM (WIRE) ×2 IMPLANT
KIT ENCORE 26 ADVANTAGE (KITS) ×2 IMPLANT
PACK ANGIOGRAPHY (CUSTOM PROCEDURE TRAY) ×2 IMPLANT
SHEATH BRITE TIP 5FRX11 (SHEATH) ×2 IMPLANT
SHEATH RAABE 6FR (SHEATH) ×2 IMPLANT
STENT LIFESTENT 5F 5X60X135 (Permanent Stent) ×2 IMPLANT
STENT LIFESTENT 5F 6X60X135 (Permanent Stent) ×2 IMPLANT
SYR MEDRAD MARK 7 150ML (SYRINGE) ×2 IMPLANT
TUBING CONTRAST HIGH PRESS 72 (TUBING) ×2 IMPLANT
WIRE G V18X300CM (WIRE) ×2 IMPLANT
WIRE GUIDERIGHT .035X150 (WIRE) ×2 IMPLANT

## 2021-03-02 NOTE — Op Note (Signed)
[facility] Percutaneous Study/Intervention Procedural Note   Date of Surgery: 03/02/2021  Surgeon:  Katha Cabal, MD.  Pre-operative Diagnosis: Atherosclerotic occlusive disease bilateral lower extremities with rest pain of the right foot associated with ulceration  Post-operative diagnosis: Same  Procedure(s) Performed: 1. Introduction catheter into right lower extremity 3rd order catheter placement  2. Contrast injection right lower extremity for distal runoff   3. Percutaneous transluminal angioplasty and stent placement right superficial femoral and popliteal arteries 4. Rota Rex thrombectomy right superficial femoral and popliteal arteries.             5.  Star close closure left common femoral arteriotomy  Anesthesia: Conscious sedation was administered under my direct supervision by the interventional radiology RN. IV Versed plus fentanyl were utilized. Continuous ECG, pulse oximetry and blood pressure was monitored throughout the entire procedure.  Conscious sedation was for a total of 1 hour 4 minutes and 45 seconds.  Sheath: 6 French Raby left common femoral retrograde  Contrast: 70 cc  Fluoroscopy Time: 9.9 minutes  Indications: Cindy Fischer presents with pain and ulceration of the right foot.  She has known atherosclerotic occlusive disease.  Recent angiogram was performed at which time kissing stents were placed in the iliacs.  She now returns for completion of the revascularization of her right lower extremity.  The risks and benefits are reviewed all questions answered patient agrees to proceed.  Procedure: Cindy Fischer is a 75 y.o. y.o. female who was identified and appropriate procedural time out was performed. The patient was then placed supine on the table and prepped and draped in the usual sterile fashion.   Ultrasound was placed in the sterile sleeve and the left groin was  evaluated the left common femoral artery was echolucent and pulsatile indicating patency.  Image was recorded for the permanent record and under real-time visualization a microneedle was inserted into the common femoral artery microwire followed by a micro-sheath.  A J-wire was then advanced through the micro-sheath and a  5 [place] sheath was then inserted over a J-wire. J-wire was then advanced and a 5 French pigtail catheter was positioned at the level of T12. AP projection of the aorta was then obtained. Pigtail catheter was repositioned to above the bifurcation and a LAO view of the pelvis was obtained.  Subsequently a rim catheter with the stiff angle Glidewire was used to cross the aortic bifurcation the catheter wire were advanced down into the right distal external iliac artery. Oblique view of the femoral bifurcation was then obtained and subsequently the wire was reintroduced and the pigtail catheter negotiated into the SFA representing third order catheter placement. Distal runoff was then performed.  Diagnostic interpretation: The abdominal aorta is opacified with a bolus injection contrast.  There are no changes from the recent aortogram.  The bilateral common iliac artery stents are widely patent as is the remaining portions of the common iliac arteries bilaterally.  The external iliac arteries are widely patent.  The right common femoral and profunda femoris are patent there is diffuse disease but there are no hemodynamically significant lesions noted.  The superficial femoral artery is patent in its proximal two thirds.  At the level of Hunter's canal where the femoral-popliteal arteries have been previously stented there is an occlusion.  The occlusion persist throughout the majority of the stent.  There is also distal disease in the popliteal of greater than 80% just below the stent.  The distal popliteal is  widely patent and free of hemodynamically significant stenosis.  The trifurcation is  patent although the peroneal is quite small and does not contribute significantly to the flow of the foot the anterior tibial and posterior tibial are both patent filling the dorsalis pedis and plantar vessels respectively.  5000 units of heparin was then given and allowed to circulate and a 6 [place] Raby sheath was advanced up and over the bifurcation and positioned in the femoral artery  KMP  catheter and advantage were then negotiated down into the distal popliteal.  Hand-injection of contrast within the distal popliteal is used to verify intraluminal placement.  A V 18 wire is then advanced under direct fluoroscopic visualization and the Kumpe catheter removed.  The [place] Rex device is then used beginning at the top of the stent a total of 3 passes are made.  Follow-up imaging now demonstrates that there is been recanalization of the stented segment.  Clearly there are 3 places of greater than 70% residual stenosis.  However, there is now flow with improved filling of the trifurcation.  A 4 mm x 150 mm Lutonix drug-eluting balloon is then advanced across the diseased segment inflation is to 10 atm for 2 minutes.  Follow-up imaging demonstrates the leading edge of the stent has a greater than 60% residual stenosis and there appears to be a greater than 60% stenosis at the distal margin of the stent in the mid popliteal.  The midportion of the stent appears to be fully expanded and well treated with less than 5% residual stenosis.  Next a 6 mm x 60 mm life stent is deployed across the proximal high-grade residual stenosis.  I then elected to post dilate the entire stent as well as the newly placed stent with a 5 mm x 220 mm Lutonix drug-eluting balloon inflated to 8 atm for 1 minute.  Follow-up imaging demonstrated persistence of the distal lesion there is now less than 5% residual stenosis in the leading edge where the newly placed stent was placed.  Distally I treated this area with 1 more 4 mm balloon  inflation but this did not have a significant impact and I elected to place a 5 mm x 60 mm life stent across this popliteal lesion and postdilated it to 4 mm.  Follow-up imaging now demonstrated that there is less than 5% residual stenosis throughout the entire stented segment the SFA and popliteal are widely patent with preservation of the distal runoff.    After review of these images the sheath is pulled into the left external iliac oblique of the common femoral is obtained and a Star close device deployed. There no immediate complications.   Findings:  The abdominal aorta is opacified with a bolus injection contrast.  There are no changes from the recent aortogram.  The bilateral common iliac artery stents are widely patent as is the remaining portions of the common iliac arteries bilaterally.  The external iliac arteries are widely patent.  The right common femoral and profunda femoris are patent there is diffuse disease but there are no hemodynamically significant lesions noted.  The superficial femoral artery is patent in its proximal two thirds.  At the level of Hunter's canal where the femoral-popliteal arteries have been previously stented there is an occlusion.  The occlusion persist throughout the majority of the stent.  There is also distal disease in the popliteal of greater than 80% just below the stent.  The distal popliteal is widely patent and free of hemodynamically  significant stenosis.  The trifurcation is patent although the peroneal is quite small and does not contribute significantly to the flow of the foot the anterior tibial and posterior tibial are both patent filling the dorsalis pedis and plantar vessels respectively.  Following Green[place] Rex thrombectomy there is now successful recanalization of the SFA and popliteal.  Following angioplasty and stent placement as described above follow-up imaging now demonstrated that there is less than 5% residual stenosis throughout the entire  stented segment the SFA and popliteal are widely patent with preservation of the distal runoff.      Summary: Successful recanalization right lower extremity for limb salvage    Disposition: Patient was taken to the recovery room in stable condition having tolerated the procedure well.  Schnier, Dolores Lory 03/02/2021,12:10 PM

## 2021-03-02 NOTE — Progress Notes (Signed)
Patient Cindy Fischer. Patient given graham crackers and ginger ale at this time.

## 2021-03-02 NOTE — Discharge Instructions (Signed)
moder Moderate Conscious Sedation, Adult, Care After This sheet gives you information about how to care for yourself after your procedure. Your health care provider may also give you more specific instructions. If you have problems or questions, contact your health care provider. What can I expect after the procedure? After the procedure, it is common to have:  Sleepiness for several hours.  Impaired judgment for several hours.  Difficulty with balance.  Vomiting if you eat too soon. Follow these instructions at home: For the time period you were told by your health care provider:  Rest.  Do not participate in activities where you could fall or become injured.  Do not drive or use machinery.  Do not drink alcohol.  Do not take sleeping pills or medicines that cause drowsiness.  Do not make important decisions or sign legal documents.  Do not take care of children on your own.      Eating and drinking  Follow the diet recommended by your health care provider.  Drink enough fluid to keep your urine pale yellow.  If you vomit: ? Drink water, juice, or soup when you can drink without vomiting. ? Make sure you have little or no nausea before eating solid foods.   General instructions  Take over-the-counter and prescription medicines only as told by your health care provider.  Have a responsible adult stay with you for the time you are told. It is important to have someone help care for you until you are awake and alert.  Do not smoke.  Keep all follow-up visits as told by your health care provider. This is important. Contact a health care provider if:  You are still sleepy or having trouble with balance after 24 hours.  You feel light-headed.  You keep feeling nauseous or you keep vomiting.  You develop a rash.  You have a fever.  You have redness or swelling around the IV site. Get help right away if:  You have trouble breathing.  You have new-onset  confusion at home. Summary  After the procedure, it is common to feel sleepy, have impaired judgment, or feel nauseous if you eat too soon.  Rest after you get home. Know the things you should not do after the procedure.  Follow the diet recommended by your health care provider and drink enough fluid to keep your urine pale yellow.  Get help right away if you have trouble breathing or new-onset confusion at home. This information is not intended to replace advice given to you by your health care provider. Make sure you discuss any questions you have with your health care provider. Document Revised: 04/09/2020 Document Reviewed: 11/06/2019 Elsevier Patient Education  2021 [place]. Femoral Site Care  This sheet gives you information about how to care for yourself after your procedure. Your health care provider may also give you more specific instructions. If you have problems or questions, contact your health care provider. What can I expect after the procedure? After the procedure, it is common to have:  Bruising that usually fades within 1-2 weeks.  Tenderness at the site. Follow these instructions at home: Wound care  Follow instructions from your health care provider about how to take care of your insertion site. Make sure you: ? Wash your hands with soap and water before you change your bandage (dressing). If soap and water are not available, use hand sanitizer. ? Change your dressing as told by your health care provider. ? Leave stitches (sutures), skin glue,  or adhesive strips in place. These skin closures may need to stay in place for 2 weeks or longer. If adhesive strip edges start to loosen and curl up, you may trim the loose edges. Do not remove adhesive strips completely unless your health care provider tells you to do that.  Do not take baths, swim, or use a hot tub until your health care provider approves.  You may shower 24-48 hours after the procedure or as told by  your health care provider. ? Gently wash the site with plain soap and water. ? Pat the area dry with a clean towel. ? Do not rub the site. This may cause bleeding.  Do not apply powder or lotion to the site. Keep the site clean and dry.  Check your femoral site every day for signs of infection. Check for: ? Redness, swelling, or pain. ? Fluid or blood. ? Warmth. ? Pus or a bad smell. Activity  For the first 2-3 days after your procedure, or as long as directed: ? Avoid climbing stairs as much as possible. ? Do not squat.  Do not lift anything that is heavier than 10 lb (4.5 kg), or the limit that you are told, until your health care provider says that it is safe.  Rest as directed. ? Avoid sitting for a long time without moving. Get up to take short walks every 1-2 hours.  Do not drive for 24 hours if you were given a medicine to help you relax (sedative). General instructions  Take over-the-counter and prescription medicines only as told by your health care provider.  Keep all follow-up visits as told by your health care provider. This is important. Contact a health care provider if you have:  A fever or chills.  You have redness, swelling, or pain around your insertion site. Get help right away if:  The catheter insertion area swells very fast.  You pass out.  You suddenly start to sweat or your skin gets clammy.  The catheter insertion area is bleeding, and the bleeding does not stop when you hold steady pressure on the area.  The area near or just beyond the catheter insertion site becomes pale, cool, tingly, or numb. These symptoms may represent a serious problem that is an emergency. Do not wait to see if the symptoms will go away. Get medical help right away. Call your local emergency services (911 in the [place] Do not drive yourself to the hospital. Summary  After the procedure, it is common to have bruising that usually fades within 1-2 weeks.  Check your  femoral site every day for signs of infection.  Do not lift anything that is heavier than 10 lb (4.5 kg), or the limit that you are told, until your health care provider says that it is safe. This information is not intended to replace advice given to you by your health care provider. Make sure you discuss any questions you have with your health care provider. Document Revised: 08/13/2020 Document Reviewed: 08/13/2020 Elsevier Patient Education  [place].

## 2021-03-02 NOTE — Interval H&P Note (Signed)
History and Physical Interval Note:  03/02/2021 10:48 AM  Cindy Fischer  has presented today for surgery, with the diagnosis of R lower extremity angio    BARD   ASO w claudication Covid Mar 7.  The various methods of treatment have been discussed with the patient and family. After consideration of risks, benefits and other options for treatment, the patient has consented to  Procedure(s): LOWER EXTREMITY ANGIOGRAPHY (Right) as a surgical intervention.  The patient's history has been reviewed, patient examined, no change in status, stable for surgery.  I have reviewed the patient's chart and labs.  Questions were answered to the patient's satisfaction.     Hortencia Pilar

## 2021-03-03 ENCOUNTER — Encounter: Payer: Self-pay | Admitting: Vascular Surgery

## 2021-03-09 ENCOUNTER — Encounter: Payer: Self-pay | Admitting: Podiatry

## 2021-03-09 ENCOUNTER — Other Ambulatory Visit: Payer: Self-pay

## 2021-03-09 ENCOUNTER — Ambulatory Visit (INDEPENDENT_AMBULATORY_CARE_PROVIDER_SITE_OTHER): Payer: Medicare Other | Admitting: Podiatry

## 2021-03-09 DIAGNOSIS — E1159 Type 2 diabetes mellitus with other circulatory complications: Secondary | ICD-10-CM

## 2021-03-09 DIAGNOSIS — S90424A Blister (nonthermal), right lesser toe(s), initial encounter: Secondary | ICD-10-CM

## 2021-03-09 DIAGNOSIS — I739 Peripheral vascular disease, unspecified: Secondary | ICD-10-CM

## 2021-03-09 DIAGNOSIS — Z794 Long term (current) use of insulin: Secondary | ICD-10-CM

## 2021-03-09 DIAGNOSIS — I96 Gangrene, not elsewhere classified: Secondary | ICD-10-CM

## 2021-03-09 DIAGNOSIS — L97511 Non-pressure chronic ulcer of other part of right foot limited to breakdown of skin: Secondary | ICD-10-CM

## 2021-03-09 MED ORDER — SULFAMETHOXAZOLE-TRIMETHOPRIM 800-160 MG PO TABS: 1.0000 | ORAL_TABLET | Freq: Two times a day (BID) | ORAL | 1 refills | Status: DC

## 2021-03-09 MED ORDER — TRAMADOL HCL 50 MG PO TABS: 50.0000 mg | ORAL_TABLET | Freq: Every day | ORAL | 0 refills | Status: AC

## 2021-03-10 ENCOUNTER — Other Ambulatory Visit: Payer: Self-pay | Admitting: Internal Medicine

## 2021-03-10 DIAGNOSIS — Z1231 Encounter for screening mammogram for malignant neoplasm of breast: Secondary | ICD-10-CM

## 2021-03-13 NOTE — Progress Notes (Signed)
  Subjective:  Patient ID: Cindy Fischer, female    DOB: 04-Oct-1946,  MRN: [id number]  Chief Complaint  Patient presents with  . Wound Check    75 y.o. female returns with the above complaint. History confirmed with patient.  Had recent angiography  Objective:  Physical Exam: blanching with elevation noted, dependent rubor present, DP absent right, gangrenous changes at right hallux, fourth and fifth toes and lateral right heel, normal sensory exam and PT absent right.  Foot is much warmer today       Angiography reviewed: She has good two-vessel run off to and through the foot via the AT and PT  Assessment:   1. Type 2 diabetes mellitus with other circulatory complication, with long-term current use of insulin (HCC)   2. Gangrene ([place])   3. Peripheral arterial disease ([place])   4. 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 Toe blister without infection, right, initial encounter      Plan:  Patient was evaluated and treated and all questions answered.  Ulcer right foot -Hopeful these will now heal she has much better perfusion to the foot -Continue using Santyl I reviewed the use and instructions on this.-Continue off-loading with surgical shoe which was dispensed today. -I feel we should continue local wound care for now with enzymatic debridement         Return in about 2 weeks (around 03/23/2021) for wound re-check.

## 2021-03-16 ENCOUNTER — Ambulatory Visit
Admission: RE | Admit: 2021-03-16 | Discharge: 2021-03-16 | Disposition: A | Discharge status: Home / Self Care | Payer: Medicare Other | Source: Ambulatory Visit | Attending: Internal Medicine | Admitting: Internal Medicine

## 2021-03-16 ENCOUNTER — Other Ambulatory Visit: Payer: Self-pay

## 2021-03-16 DIAGNOSIS — Z1231 Encounter for screening mammogram for malignant neoplasm of breast: Secondary | ICD-10-CM | POA: Diagnosis not present

## 2021-03-21 ENCOUNTER — Encounter (INDEPENDENT_AMBULATORY_CARE_PROVIDER_SITE_OTHER): Payer: Medicare Other

## 2021-03-21 ENCOUNTER — Ambulatory Visit (INDEPENDENT_AMBULATORY_CARE_PROVIDER_SITE_OTHER): Payer: Medicare Other | Admitting: Vascular Surgery

## 2021-03-22 ENCOUNTER — Encounter (INDEPENDENT_AMBULATORY_CARE_PROVIDER_SITE_OTHER): Payer: Medicare Other

## 2021-03-23 ENCOUNTER — Encounter: Payer: Self-pay | Admitting: Podiatry

## 2021-03-23 ENCOUNTER — Ambulatory Visit (INDEPENDENT_AMBULATORY_CARE_PROVIDER_SITE_OTHER): Payer: Medicare Other | Admitting: Podiatry

## 2021-03-23 ENCOUNTER — Other Ambulatory Visit (INDEPENDENT_AMBULATORY_CARE_PROVIDER_SITE_OTHER): Payer: Self-pay | Admitting: Vascular Surgery

## 2021-03-23 ENCOUNTER — Other Ambulatory Visit: Payer: Self-pay

## 2021-03-23 DIAGNOSIS — Z794 Long term (current) use of insulin: Secondary | ICD-10-CM

## 2021-03-23 DIAGNOSIS — I96 Gangrene, not elsewhere classified: Secondary | ICD-10-CM | POA: Diagnosis not present

## 2021-03-23 DIAGNOSIS — L97511 Non-pressure chronic ulcer of other part of right foot limited to breakdown of skin: Secondary | ICD-10-CM | POA: Diagnosis not present

## 2021-03-23 DIAGNOSIS — I70221 Atherosclerosis of native arteries of extremities with rest pain, right leg: Secondary | ICD-10-CM

## 2021-03-23 DIAGNOSIS — I739 Peripheral vascular disease, unspecified: Secondary | ICD-10-CM | POA: Diagnosis not present

## 2021-03-23 DIAGNOSIS — E1159 Type 2 diabetes mellitus with other circulatory complications: Secondary | ICD-10-CM

## 2021-03-23 DIAGNOSIS — Z95820 Peripheral vascular angioplasty status with implants and grafts: Secondary | ICD-10-CM

## 2021-03-23 NOTE — Progress Notes (Signed)
  Subjective:  Patient ID: Cindy Fischer, female    DOB: [birth year],  MRN: [id number]  Chief Complaint  Patient presents with  . Wound Check    "I think its a little better"    75 y.o. female returns with the above complaint. History confirmed with patient.  Doing well thinks is improving  Objective:  Physical Exam: blanching with elevation noted, dependent rubor present, DP absent right, ulcerations present at right hallux, fourth and fifth toes and lateral right heel, normal sensory exam and PT absent right.  Foot is much warmer than previous, improving significantly, erythema is gone and ulcer seem to be healing   Angiography reviewed: She has good two-vessel run off to and through the foot via the AT and PT  Assessment:   1. Type 2 diabetes mellitus with other circulatory complication, with long-term current use of insulin ([place])   2. 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 Peripheral arterial disease ([place])   4. Gangrene ([place])      Plan:  Patient was evaluated and treated and all questions answered.  Ulcer right foot -Hopeful these will now heal she has much better perfusion to the foot -Continue using Santyl I reviewed the use and instructions on this.-Continue off-loading with surgical shoe which was dispensed today. -I feel we should continue local wound care for now with enzymatic debridement         No follow-ups on file.

## 2021-03-24 ENCOUNTER — Ambulatory Visit (INDEPENDENT_AMBULATORY_CARE_PROVIDER_SITE_OTHER): Payer: Medicare Other | Admitting: Vascular Surgery

## 2021-03-24 ENCOUNTER — Encounter (INDEPENDENT_AMBULATORY_CARE_PROVIDER_SITE_OTHER): Payer: Self-pay | Admitting: Vascular Surgery

## 2021-03-24 ENCOUNTER — Ambulatory Visit (INDEPENDENT_AMBULATORY_CARE_PROVIDER_SITE_OTHER): Payer: Medicare Other

## 2021-03-24 VITALS — BP 164/66 | HR 106 | Resp 18 | Ht 62.0 in | Wt 158.0 lb

## 2021-03-24 DIAGNOSIS — I10 Essential (primary) hypertension: Secondary | ICD-10-CM | POA: Diagnosis not present

## 2021-03-24 DIAGNOSIS — E782 Mixed hyperlipidemia: Secondary | ICD-10-CM | POA: Diagnosis not present

## 2021-03-24 DIAGNOSIS — I70221 Atherosclerosis of native arteries of extremities with rest pain, right leg: Secondary | ICD-10-CM | POA: Diagnosis not present

## 2021-03-24 DIAGNOSIS — Z95820 Peripheral vascular angioplasty status with implants and grafts: Secondary | ICD-10-CM

## 2021-03-24 DIAGNOSIS — I70213 Atherosclerosis of native arteries of extremities with intermittent claudication, bilateral legs: Secondary | ICD-10-CM | POA: Diagnosis not present

## 2021-03-24 DIAGNOSIS — I890 Lymphedema, not elsewhere classified: Secondary | ICD-10-CM | POA: Diagnosis not present

## 2021-03-25 ENCOUNTER — Other Ambulatory Visit: Admit: 2021-03-25 | Discharge: 2021-03-26 | Payer: MEDICARE

## 2021-03-25 ENCOUNTER — Other Ambulatory Visit
Admission: RE | Admit: 2021-03-25 | Discharge: 2021-03-25 | Disposition: A | Discharge status: Home / Self Care | Payer: Medicare Other | Attending: Nephrology | Admitting: Nephrology

## 2021-03-25 ENCOUNTER — Other Ambulatory Visit: Payer: Self-pay

## 2021-03-25 DIAGNOSIS — Z789 Other specified health status: Secondary | ICD-10-CM | POA: Diagnosis not present

## 2021-03-25 DIAGNOSIS — E1129 Type 2 diabetes mellitus with other diabetic kidney complication: Secondary | ICD-10-CM | POA: Insufficient documentation

## 2021-03-25 DIAGNOSIS — D631 Anemia in chronic kidney disease: Secondary | ICD-10-CM | POA: Diagnosis not present

## 2021-03-25 DIAGNOSIS — Z940 Kidney transplant status: Secondary | ICD-10-CM | POA: Diagnosis not present

## 2021-03-25 DIAGNOSIS — Z09 Encounter for follow-up examination after completed treatment for conditions other than malignant neoplasm: Secondary | ICD-10-CM | POA: Insufficient documentation

## 2021-03-25 DIAGNOSIS — D899 Disorder involving the immune mechanism, unspecified: Secondary | ICD-10-CM | POA: Insufficient documentation

## 2021-03-25 DIAGNOSIS — N390 Urinary tract infection, site not specified: Secondary | ICD-10-CM | POA: Diagnosis not present

## 2021-03-25 DIAGNOSIS — Z9483 Pancreas transplant status: Secondary | ICD-10-CM | POA: Diagnosis not present

## 2021-03-25 LAB — BASIC METABOLIC PANEL
Anion gap: 6 (ref 5–15)
BUN: 18 mg/dL (ref 8–23)
CO2: 26 mmol/L (ref 22–32)
Calcium: 8.9 mg/dL (ref 8.9–10.3)
Chloride: 103 mmol/L (ref 98–111)
Creatinine, Ser: 0.78 mg/dL (ref 0.44–1.00)
GFR, Estimated: >60 mL/min (ref >60)
Glucose, Bld: 100 mg/dL — ABNORMAL HIGH (ref 70–99)
Potassium: 4.1 mmol/L (ref 3.5–5.1)
Sodium: 135 mmol/L (ref 135–145)

## 2021-03-25 LAB — CBC WITH DIFFERENTIAL/PLATELET
Abs Immature Granulocytes: 0.31 10*3/uL — ABNORMAL HIGH (ref 0.00–0.07)
Basophils Absolute: 0 10*3/uL (ref 0.0–0.1)
Basophils Relative: 1 %
Eosinophils Absolute: 0.1 10*3/uL (ref 0.0–0.5)
Eosinophils Relative: 1 %
HCT: 39.6 % (ref 36.0–46.0)
Hemoglobin: 12.9 g/dL (ref 12.0–15.0)
Immature Granulocytes: 4 %
Lymphocytes Relative: 28 %
Lymphs Abs: 2 10*3/uL (ref 0.7–4.0)
MCH: 29.9 pg (ref 26.0–34.0)
MCHC: 32.6 g/dL (ref 30.0–36.0)
MCV: 91.9 fL (ref 80.0–100.0)
Monocytes Absolute: 0.8 10*3/uL (ref 0.1–1.0)
Monocytes Relative: 11 %
Neutro Abs: 3.8 10*3/uL (ref 1.7–7.7)
Neutrophils Relative %: 55 %
Platelets: 765 10*3/uL — ABNORMAL HIGH (ref 150–400)
RBC: 4.31 MIL/uL (ref 3.87–5.11)
RDW: 17.1 % — ABNORMAL HIGH (ref 11.5–15.5)
WBC: 7 10*3/uL (ref 4.0–10.5)
nRBC: 0 % (ref 0.0–0.2)

## 2021-03-25 LAB — PHOSPHORUS: Phosphorus: 3.6 mg/dL (ref 2.5–4.6)

## 2021-03-25 LAB — MAGNESIUM: Magnesium: 2 mg/dL (ref 1.7–2.4)

## 2021-03-27 ENCOUNTER — Encounter (INDEPENDENT_AMBULATORY_CARE_PROVIDER_SITE_OTHER): Payer: Self-pay | Admitting: Vascular Surgery

## 2021-03-27 NOTE — Progress Notes (Signed)
MRN : [id number]  Cindy Fischer is a 75 y.o. (December 29, 1945) female who presents with chief complaint of  Chief Complaint  Patient presents with  . Follow-up    ultrasound  .  History of Present Illness:   The patient returns to the office for followup and review of the noninvasive studies. There have been no interval changes in lower extremity symptoms. No interval shortening of the patient's claudication distance or development of rest pain symptoms. No new ulcers or wounds have occurred since the last visit.  There have been no significant changes to the patient's overall health care.  The patient denies amaurosis fugax or recent TIA symptoms. There are no recent neurological changes noted. The patient denies history of DVT, PE or superficial thrombophlebitis. The patient denies recent episodes of angina or shortness of breath.   ABI Rt=[place] (TBI=2.05) and Lt=[place] (TBI=1.07)  (previous ABI's Rt=[place] and Lt=[place])   Current Meds  Medication Sig  . acetaminophen (TYLENOL) 650 MG CR tablet Take 1,300 mg by mouth every 8 (eight) hours as needed for pain.  Marland Kitchen aspirin EC 81 MG tablet Take 81 mg by mouth daily.  Marland Kitchen atorvastatin (LIPITOR) 20 MG tablet Take 20 mg by mouth daily.  . Cholecalciferol (VITAMIN D) 2000 units tablet Take 2,000 Units by mouth daily.  . clopidogrel (PLAVIX) 75 MG tablet Take 1 tablet (75 mg total) by mouth daily.  . Everolimus 0.5 MG TABS Take 1 mg by mouth 2 (two) times daily.  . insulin NPH Human (HUMULIN N,NOVOLIN N) 100 UNIT/ML injection Inject 30 Units into the skin daily before breakfast.  . insulin regular (NOVOLIN R,HUMULIN R) 100 units/mL injection Inject 3-10 Units into the skin 3 (three) times daily as needed for high blood sugar. Sliding scale  . levothyroxine (SYNTHROID, LEVOTHROID) 100 MCG tablet Take 100 mcg by mouth See admin instructions. Take 100 mcg daily except skip dose on Saturdays  . lisinopril (PRINIVIL,ZESTRIL) 10 MG tablet Take 10 mg by mouth  daily.  Marland Kitchen omeprazole (PRILOSEC) 40 MG capsule Take 40 mg by mouth every other day.   Vladimir Faster Glycol-Propyl Glycol (SYSTANE ULTRA OP) Place 1 drop into both eyes daily as needed (Dry eyes).  . predniSONE (DELTASONE) 5 MG tablet Take 5 mg by mouth daily.   . tacrolimus (PROGRAF) 0.5 MG capsule Take 1-1.5 mg by mouth See admin instructions. Take 1.5 mg in the morning and 1 mg in the evening    Past Medical History:  Diagnosis Date  . Cancer ([place]) 2016   skin (nose)  . Diabetes mellitus without complication ([place])   . Hypothyroidism   . Pneumonia   . Renal disorder     Past Surgical History:  Procedure Laterality Date  . CATARACT EXTRACTION, BILATERAL    . COLONOSCOPY  12/02/2010  . COLONOSCOPY WITH PROPOFOL N/A 09/04/2016   Procedure: COLONOSCOPY WITH PROPOFOL;  Surgeon: Christene Lye, MD;  Location: [facility] ENDOSCOPY;  Service: Endoscopy;  Laterality: N/A;  . EYE SURGERY    . INSERTION OF DIALYSIS CATHETER  2014   removed 2014 (only had in for about 8 weeks)  . laser surgery on eyes  1999  . LOWER EXTREMITY ANGIOGRAPHY Right 10/16/2018   Procedure: LOWER EXTREMITY ANGIOGRAPHY;  Surgeon: Katha Cabal, MD;  Location: [facility];  Service: Cardiovascular;  Laterality: Right;  . LOWER EXTREMITY ANGIOGRAPHY Right 02/08/2021   Procedure: LOWER EXTREMITY ANGIOGRAPHY;  Surgeon: Katha Cabal, MD;  Location: [facility];  Service: Cardiovascular;  Laterality: Right;  . LOWER EXTREMITY ANGIOGRAPHY Right 03/02/2021   Procedure: LOWER EXTREMITY ANGIOGRAPHY;  Surgeon: Katha Cabal, MD;  Location: [facility];  Service: Cardiovascular;  Laterality: Right;  . 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 SPINE SURGERY  11/10/2021  . TONSILLECTOMY    . TUBAL LIGATION      Social History Social History   Tobacco Use  . Smoking status: Former Smoker    Types: Cigarettes  . Smokeless tobacco: Never Used  Vaping Use  . Vaping Use: Never used   Substance Use Topics  . Alcohol use: No  . Drug use: No    Family History Family History  Problem Relation Age of Onset  . Multiple myeloma Mother   . Diabetes Father   . Heart attack Father   . Breast cancer Neg Hx     Allergies  Allergen Reactions  . Latex Itching  . Voltaren [Diclofenac Sodium] Diarrhea and Nausea And Vomiting  . Oxycodone-Acetaminophen Nausea And Vomiting  . Penicillins Rash    Has patient had a PCN reaction causing immediate rash, facial/tongue/throat swelling, SOB or lightheadedness with hypotension: Yes Has patient had a PCN reaction causing severe rash involving mucus membranes or skin necrosis: No Has patient had a PCN reaction that required hospitalization: No Has patient had a PCN reaction occurring within the last 10 years: No If all of the above answers are "NO", then may proceed with Cephalosporin use.      REVIEW OF SYSTEMS (Negative unless checked)  Constitutional: '[]' Weight loss  '[]' Fever  '[]' Chills Cardiac: '[]' Chest pain   '[]' Chest pressure   '[]' Palpitations   '[]' Shortness of breath when laying flat   '[]' Shortness of breath with exertion. Vascular:  '[x]' Pain in legs with walking   '[]' Pain in legs at rest  '[]' History of DVT   '[]' Phlebitis   '[]' Swelling in legs   '[]' Varicose veins   '[]' Non-healing ulcers Pulmonary:   '[]' Uses home oxygen   '[]' Productive cough   '[]' Hemoptysis   '[]' Wheeze  '[]' COPD   '[]' Asthma Neurologic:  '[]' Dizziness   '[]' Seizures   '[]' History of stroke   '[]' History of TIA  '[]' Aphasia   '[]' Vissual changes   '[]' Weakness or numbness in arm   '[]' Weakness or numbness in leg Musculoskeletal:   '[]' Joint swelling   '[]' Joint pain   '[]' Low back pain Hematologic:  '[]' Easy bruising  '[]' Easy bleeding   '[]' Hypercoagulable state   '[]' Anemic Gastrointestinal:  '[]' Diarrhea   '[]' Vomiting  '[]' Gastroesophageal reflux/heartburn   '[]' Difficulty swallowing. Genitourinary:  '[]' Chronic kidney disease   '[]' Difficult urination  '[]' Frequent urination   '[]' Blood in urine Skin:  '[]' Rashes   '[]' Ulcers   Psychological:  '[]' History of anxiety   '[]'  History of major depression.  Physical Examination  Vitals:   03/24/21 1339  BP: (!) 164/66  Pulse: (!) 106  Resp: 18  Weight: 158 lb (71.7 kg)  Height: '5\' 2"'  (1.575 m)   Body mass index is 28.9 kg/m. Gen: WD/WN, NAD Head: [place]/AT, No temporalis wasting.  Ear/Nose/Throat: Hearing grossly intact, nares w/o erythema or drainage Eyes: PER, EOMI, sclera nonicteric.  Neck: Supple, no large masses.   Pulmonary:  Good air movement, no audible wheezing bilaterally, no use of accessory muscles.  Cardiac: RRR, no JVD Vascular:  Vessel Right Left  Radial Palpable Palpable  PT Trace Palpable Palpable  DP Trace Palpable Palpable  Gastrointestinal: Non-distended. No guarding/no peritoneal signs.  Musculoskeletal: M/S 5/5 throughout.  No deformity or atrophy.  Neurologic: CN 2-12 intact. Symmetrical.  Speech  is fluent. Motor exam as listed above. Psychiatric: Judgment intact, Mood & affect appropriate for pt's clinical situation. Dermatologic: No rashes or ulcers noted.  No changes consistent with cellulitis.  CBC Lab Results  Component Value Date   WBC 7.0 03/25/2021   HGB 12.9 03/25/2021   HCT 39.6 03/25/2021   MCV 91.9 03/25/2021   PLT 765 (H) 03/25/2021    BMET    Component Value Date/Time   NA 135 03/25/2021 0844   NA 138 03/29/2015 0918   K 4.1 03/25/2021 0844   K 4.5 03/29/2015 0918   CL 103 03/25/2021 0844   CL 104 03/29/2015 0918   CO2 26 03/25/2021 0844   CO2 26 03/29/2015 0918   GLUCOSE 100 (H) 03/25/2021 0844   GLUCOSE 187 (H) 03/29/2015 0918   BUN 18 03/25/2021 0844   BUN 10 03/29/2015 0918   CREATININE 0.78 03/25/2021 0844   CREATININE 0.80 03/29/2015 0918   CALCIUM 8.9 03/25/2021 0844   CALCIUM 9.1 03/29/2015 0918   GFRNONAA >60 03/25/2021 0844   GFRNONAA >60 03/29/2015 0918   GFRAA >60 09/24/2020 0836   GFRAA >60 03/29/2015 0918   Estimated Creatinine Clearance: 56.3 mL/min (by C-G formula based on SCr of  0.78 mg/dL).  COAG No results found for: INR, PROTIME  Radiology PERIPHERAL VASCULAR CATHETERIZATION  Result Date: 03/02/2021 See Op Note  MM 3D SCREEN BREAST BILATERAL  Result Date: 03/19/2021 CLINICAL DATA:  Screening. EXAM: DIGITAL SCREENING BILATERAL MAMMOGRAM WITH TOMOSYNTHESIS AND CAD TECHNIQUE: Bilateral screening digital craniocaudal and mediolateral oblique mammograms were obtained. Bilateral screening digital breast tomosynthesis was performed. The images were evaluated with computer-aided detection. COMPARISON:  Previous exam(s). ACR Breast Density Category b: There are scattered areas of fibroglandular density. FINDINGS: There are no findings suspicious for malignancy. The images were evaluated with computer-aided detection. IMPRESSION: No mammographic evidence of malignancy. A result letter of this screening mammogram will be mailed directly to the patient. RECOMMENDATION: Screening mammogram in one year. (Code:SM-B-01Y) BI-RADS CATEGORY  1: Negative. Electronically Signed   By: Ammie Ferrier M.D.   On: 03/19/2021 08:12   VAS [place] ABI WITH/WO TBI  Result Date: 03/24/2021 LOWER EXTREMITY DOPPLER STUDY Indications: Claudication, and peripheral artery disease.  Vascular Interventions: 10/16/2018:PTA and Stent placement in the Rt SFA and                         Popliteal Artery. Comparison Study: 02/02/2021 Performing Technologist: Charlane Ferretti RT (R)(VS)  Examination Guidelines: A complete evaluation includes at minimum, Doppler waveform signals and systolic blood pressure reading at the level of bilateral brachial, anterior tibial, and posterior tibial arteries, when vessel segments are accessible. Bilateral testing is considered an integral part of a complete examination. Photoelectric Plethysmograph (PPG) waveforms and toe systolic pressure readings are included as required and additional duplex testing as needed. Limited examinations for reoccurring indications may be performed as  noted.  ABI Findings: +---------+------------------+-----+--------+----------+ Right    Rt Pressure (mmHg)IndexWaveformComment    +---------+------------------+-----+--------+----------+ Brachial                                Lymphedema +---------+------------------+-----+--------+----------+ ATA      172               1.42 biphasic           +---------+------------------+-----+--------+----------+ PTA      139  1.15 biphasic           +---------+------------------+-----+--------+----------+ Great Toe248               2.05                    +---------+------------------+-----+--------+----------+ +---------+------------------+-----+---------+----------------+ Left     Lt Pressure (mmHg)IndexWaveform Comment          +---------+------------------+-----+---------+----------------+ Brachial 121                                              +---------+------------------+-----+---------+----------------+ ATA                             triphasicNon compressable +---------+------------------+-----+---------+----------------+ PTA                             triphasicNon compressable +---------+------------------+-----+---------+----------------+ Great Toe130               1.07                           +---------+------------------+-----+---------+----------------+ +-------+-----------+-----------+------------+------------+ ABI/TBIToday's ABIToday's TBIPrevious ABIPrevious TBI +-------+-----------+-----------+------------+------------+ Right  [place]         2.05       [place]                       +-------+-----------+-----------+------------+------------+ Left   [place]         1.07       [place]          [place]           +-------+-----------+-----------+------------+------------+ Bilateral ABIs appear essentially unchanged compared to prior study on 02/02/2021. Left TBIs appear essentially unchanged compared to prior study on 02/02/2021.  Summary: Right:  Resting right ankle-brachial index indicates noncompressible right lower extremity arteries. The right toe-brachial index is normal. Left: Resting left ankle-brachial index indicates noncompressible left lower extremity arteries. The left toe-brachial index is normal. *See table(s) above for measurements and observations.  Electronically signed by Hortencia Pilar MD on 03/24/2021 at 4:28:32 PM.   Final     Assessment/Plan 1. Atherosclerosis of native artery of both lower extremities with intermittent claudication (HCC)  Recommend:  The patient has evidence of atherosclerosis of the lower extremities with claudication.  The patient does not voice lifestyle limiting changes at this point in time.  Noninvasive studies do not suggest clinically significant change.  No invasive studies, angiography or surgery at this time The patient should continue walking and begin a more formal exercise program.  The patient should continue antiplatelet therapy and aggressive treatment of the lipid abnormalities  No changes in the patient's medications at this time  The patient should continue wearing graduated compression socks 10-15 mmHg strength to control the mild edema.   - VAS [place] ABI WITH/WO TBI; Future - VAS [place] LOWER EXTREMITY ARTERIAL DUPLEX; Future  2. Primary hypertension Continue antihypertensive medications as already ordered, these medications have been reviewed and there are no changes at this time.   3. Mixed hyperlipidemia Continue statin as ordered and reviewed, no changes at this time   4. Lymphedema No surgery or intervention at this point in time.    I have reviewed my discussion with the patient regarding venous insufficiency and  secondary lymph edema and why it  causes symptoms. I have discussed with the patient the chronic skin changes that accompany these problems and the long term sequela such as ulceration and infection.  Patient will continue wearing graduated compression  stockings class 1 (20-30 mmHg) on a daily basis a prescription was given to the patient to keep this updated. The patient will  put the stockings on first thing in the morning and removing them in the evening. The patient is instructed specifically not to sleep in the stockings.  In addition, behavioral modification including elevation during the day will be continued.  Diet and salt restriction was also discussed.  Previous duplex ultrasound of the lower extremities shows normal deep venous system, superficial reflux was not present.   Following the review of the ultrasound the patient will follow up in 12 months to reassess the degree of swelling and the control that graduated compression is offering.   The patient can be assessed for a Lymph Pump at that time.  However, at this time the patient states they are satisfied with the control compression and elevation is yielding.     Hortencia Pilar, MD  03/27/2021 4:18 PM

## 2021-04-13 ENCOUNTER — Other Ambulatory Visit: Payer: Self-pay

## 2021-04-13 ENCOUNTER — Encounter: Payer: Self-pay | Admitting: Podiatry

## 2021-04-13 ENCOUNTER — Ambulatory Visit (INDEPENDENT_AMBULATORY_CARE_PROVIDER_SITE_OTHER): Payer: Medicare Other | Admitting: Podiatry

## 2021-04-13 DIAGNOSIS — Z794 Long term (current) use of insulin: Secondary | ICD-10-CM

## 2021-04-13 DIAGNOSIS — I739 Peripheral vascular disease, unspecified: Secondary | ICD-10-CM

## 2021-04-13 DIAGNOSIS — L97511 Non-pressure chronic ulcer of other part of right foot limited to breakdown of skin: Secondary | ICD-10-CM

## 2021-04-13 DIAGNOSIS — E1159 Type 2 diabetes mellitus with other circulatory complications: Secondary | ICD-10-CM

## 2021-04-16 ENCOUNTER — Emergency Department: Payer: Medicare Other

## 2021-04-16 ENCOUNTER — Encounter: Payer: Self-pay | Admitting: Radiology

## 2021-04-16 ENCOUNTER — Inpatient Hospital Stay
Admission: EM | Admit: 2021-04-16 | Discharge: 2021-04-19 | DRG: 083 | Disposition: A | Discharge status: Home Health | Payer: Medicare Other | Attending: Internal Medicine | Admitting: Internal Medicine

## 2021-04-16 ENCOUNTER — Other Ambulatory Visit: Payer: Self-pay

## 2021-04-16 DIAGNOSIS — S2249XA Multiple fractures of ribs, unspecified side, initial encounter for closed fracture: Secondary | ICD-10-CM

## 2021-04-16 DIAGNOSIS — N200 Calculus of kidney: Secondary | ICD-10-CM | POA: Diagnosis not present

## 2021-04-16 DIAGNOSIS — Z79890 Hormone replacement therapy (postmenopausal): Secondary | ICD-10-CM | POA: Diagnosis not present

## 2021-04-16 DIAGNOSIS — I10 Essential (primary) hypertension: Secondary | ICD-10-CM | POA: Diagnosis not present

## 2021-04-16 DIAGNOSIS — Z20822 Contact with and (suspected) exposure to covid-19: Secondary | ICD-10-CM | POA: Diagnosis present

## 2021-04-16 DIAGNOSIS — E1151 Type 2 diabetes mellitus with diabetic peripheral angiopathy without gangrene: Secondary | ICD-10-CM | POA: Diagnosis present

## 2021-04-16 DIAGNOSIS — Z9842 Cataract extraction status, left eye: Secondary | ICD-10-CM | POA: Diagnosis not present

## 2021-04-16 DIAGNOSIS — Z85828 Personal history of other malignant neoplasm of skin: Secondary | ICD-10-CM

## 2021-04-16 DIAGNOSIS — Z9181 History of falling: Secondary | ICD-10-CM

## 2021-04-16 DIAGNOSIS — Z7982 Long term (current) use of aspirin: Secondary | ICD-10-CM

## 2021-04-16 DIAGNOSIS — Z7902 Long term (current) use of antithrombotics/antiplatelets: Secondary | ICD-10-CM | POA: Diagnosis not present

## 2021-04-16 DIAGNOSIS — R296 Repeated falls: Secondary | ICD-10-CM | POA: Diagnosis present

## 2021-04-16 DIAGNOSIS — I152 Hypertension secondary to endocrine disorders: Secondary | ICD-10-CM | POA: Diagnosis present

## 2021-04-16 DIAGNOSIS — S32010A Wedge compression fracture of first lumbar vertebra, initial encounter for closed fracture: Secondary | ICD-10-CM

## 2021-04-16 DIAGNOSIS — D75839 Thrombocytosis, unspecified: Secondary | ICD-10-CM | POA: Diagnosis not present

## 2021-04-16 DIAGNOSIS — R52 Pain, unspecified: Secondary | ICD-10-CM

## 2021-04-16 DIAGNOSIS — I70213 Atherosclerosis of native arteries of extremities with intermittent claudication, bilateral legs: Secondary | ICD-10-CM | POA: Diagnosis present

## 2021-04-16 DIAGNOSIS — R000 Tachycardia, unspecified: Secondary | ICD-10-CM | POA: Diagnosis present

## 2021-04-16 DIAGNOSIS — I890 Lymphedema, not elsewhere classified: Secondary | ICD-10-CM

## 2021-04-16 DIAGNOSIS — K219 Gastro-esophageal reflux disease without esophagitis: Secondary | ICD-10-CM | POA: Diagnosis present

## 2021-04-16 DIAGNOSIS — S2242XA Multiple fractures of ribs, left side, initial encounter for closed fracture: Secondary | ICD-10-CM | POA: Diagnosis present

## 2021-04-16 DIAGNOSIS — S22080A Wedge compression fracture of T11-T12 vertebra, initial encounter for closed fracture: Secondary | ICD-10-CM

## 2021-04-16 DIAGNOSIS — D72829 Elevated white blood cell count, unspecified: Secondary | ICD-10-CM | POA: Diagnosis present

## 2021-04-16 DIAGNOSIS — M4855XA Collapsed vertebra, not elsewhere classified, thoracolumbar region, initial encounter for fracture: Secondary | ICD-10-CM | POA: Diagnosis present

## 2021-04-16 DIAGNOSIS — Z8249 Family history of ischemic heart disease and other diseases of the circulatory system: Secondary | ICD-10-CM

## 2021-04-16 DIAGNOSIS — Z807 Family history of other malignant neoplasms of lymphoid, hematopoietic and related tissues: Secondary | ICD-10-CM | POA: Diagnosis not present

## 2021-04-16 DIAGNOSIS — I4510 Unspecified right bundle-branch block: Secondary | ICD-10-CM | POA: Diagnosis present

## 2021-04-16 DIAGNOSIS — Z9841 Cataract extraction status, right eye: Secondary | ICD-10-CM

## 2021-04-16 DIAGNOSIS — Z833 Family history of diabetes mellitus: Secondary | ICD-10-CM

## 2021-04-16 DIAGNOSIS — Z79899 Other long term (current) drug therapy: Secondary | ICD-10-CM

## 2021-04-16 DIAGNOSIS — S065X9A Traumatic subdural hemorrhage with loss of consciousness of unspecified duration, initial encounter: Principal | ICD-10-CM | POA: Diagnosis present

## 2021-04-16 DIAGNOSIS — M549 Dorsalgia, unspecified: Secondary | ICD-10-CM | POA: Diagnosis not present

## 2021-04-16 DIAGNOSIS — E113593 Type 2 diabetes mellitus with proliferative diabetic retinopathy without macular edema, bilateral: Secondary | ICD-10-CM | POA: Diagnosis present

## 2021-04-16 DIAGNOSIS — E039 Hypothyroidism, unspecified: Secondary | ICD-10-CM | POA: Diagnosis present

## 2021-04-16 DIAGNOSIS — E1159 Type 2 diabetes mellitus with other circulatory complications: Secondary | ICD-10-CM | POA: Diagnosis not present

## 2021-04-16 DIAGNOSIS — Y92009 Unspecified place in unspecified non-institutional (private) residence as the place of occurrence of the external cause: Secondary | ICD-10-CM

## 2021-04-16 DIAGNOSIS — Z940 Kidney transplant status: Secondary | ICD-10-CM | POA: Diagnosis not present

## 2021-04-16 DIAGNOSIS — M4854XA Collapsed vertebra, not elsewhere classified, thoracic region, initial encounter for fracture: Secondary | ICD-10-CM

## 2021-04-16 DIAGNOSIS — M4856XA Collapsed vertebra, not elsewhere classified, lumbar region, initial encounter for fracture: Secondary | ICD-10-CM | POA: Diagnosis present

## 2021-04-16 DIAGNOSIS — Z794 Long term (current) use of insulin: Secondary | ICD-10-CM | POA: Diagnosis not present

## 2021-04-16 DIAGNOSIS — I4891 Unspecified atrial fibrillation: Secondary | ICD-10-CM | POA: Diagnosis present

## 2021-04-16 DIAGNOSIS — Z87891 Personal history of nicotine dependence: Secondary | ICD-10-CM | POA: Diagnosis not present

## 2021-04-16 DIAGNOSIS — W19XXXA Unspecified fall, initial encounter: Secondary | ICD-10-CM | POA: Diagnosis present

## 2021-04-16 DIAGNOSIS — Z95820 Peripheral vascular angioplasty status with implants and grafts: Secondary | ICD-10-CM

## 2021-04-16 DIAGNOSIS — Z7952 Long term (current) use of systemic steroids: Secondary | ICD-10-CM

## 2021-04-16 DIAGNOSIS — E785 Hyperlipidemia, unspecified: Secondary | ICD-10-CM | POA: Diagnosis present

## 2021-04-16 DIAGNOSIS — E119 Type 2 diabetes mellitus without complications: Secondary | ICD-10-CM

## 2021-04-16 DIAGNOSIS — S065XAA Traumatic subdural hemorrhage with loss of consciousness status unknown, initial encounter: Secondary | ICD-10-CM | POA: Diagnosis present

## 2021-04-16 DIAGNOSIS — I70219 Atherosclerosis of native arteries of extremities with intermittent claudication, unspecified extremity: Secondary | ICD-10-CM | POA: Diagnosis present

## 2021-04-16 LAB — CBC WITH DIFFERENTIAL/PLATELET
Abs Immature Granulocytes: 0.11 10*3/uL — ABNORMAL HIGH (ref 0.00–0.07)
Basophils Absolute: 0 10*3/uL (ref 0.0–0.1)
Basophils Relative: 0 %
Eosinophils Absolute: 0 10*3/uL (ref 0.0–0.5)
Eosinophils Relative: 0 %
HCT: 38.4 % (ref 36.0–46.0)
Hemoglobin: 12.8 g/dL (ref 12.0–15.0)
Immature Granulocytes: 1 %
Lymphocytes Relative: 6 %
Lymphs Abs: 0.8 10*3/uL (ref 0.7–4.0)
MCH: 29.7 pg (ref 26.0–34.0)
MCHC: 33.3 g/dL (ref 30.0–36.0)
MCV: 89.1 fL (ref 80.0–100.0)
Monocytes Absolute: 0.8 10*3/uL (ref 0.1–1.0)
Monocytes Relative: 6 %
Neutro Abs: 11.6 10*3/uL — ABNORMAL HIGH (ref 1.7–7.7)
Neutrophils Relative %: 87 %
Platelets: 734 10*3/uL — ABNORMAL HIGH (ref 150–400)
RBC: 4.31 MIL/uL (ref 3.87–5.11)
RDW: 15.5 % (ref 11.5–15.5)
WBC: 13.3 10*3/uL — ABNORMAL HIGH (ref 4.0–10.5)
nRBC: 0 % (ref 0.0–0.2)

## 2021-04-16 LAB — COMPREHENSIVE METABOLIC PANEL
ALT: 14 U/L (ref 0–44)
AST: 30 U/L (ref 15–41)
Albumin: 3.5 g/dL (ref 3.5–5.0)
Alkaline Phosphatase: 67 U/L (ref 38–126)
Anion gap: 11 (ref 5–15)
BUN: 25 mg/dL — ABNORMAL HIGH (ref 8–23)
CO2: 22 mmol/L (ref 22–32)
Calcium: 9.3 mg/dL (ref 8.9–10.3)
Chloride: 102 mmol/L (ref 98–111)
Creatinine, Ser: 0.75 mg/dL (ref 0.44–1.00)
GFR, Estimated: >60 mL/min (ref >60)
Glucose, Bld: 128 mg/dL — ABNORMAL HIGH (ref 70–99)
Potassium: 4.7 mmol/L (ref 3.5–5.1)
Sodium: 135 mmol/L (ref 135–145)
Total Bilirubin: 0.9 mg/dL (ref 0.3–1.2)
Total Protein: 6.2 g/dL — ABNORMAL LOW (ref 6.5–8.1)

## 2021-04-16 LAB — ABO/RH: ABO/RH(D): A POS

## 2021-04-16 LAB — TYPE AND SCREEN
ABO/RH(D): A POS
Antibody Screen: NEGATIVE

## 2021-04-16 LAB — TSH: TSH: 2.04 u[IU]/mL (ref 0.350–4.500)

## 2021-04-16 LAB — URINALYSIS, COMPLETE (UACMP) WITH MICROSCOPIC
Bacteria, UA: NONE SEEN
Bilirubin Urine: NEGATIVE
Glucose, UA: NEGATIVE mg/dL
Hgb urine dipstick: NEGATIVE
Ketones, ur: 80 mg/dL — AB
Leukocytes,Ua: NEGATIVE
Nitrite: NEGATIVE
Protein, ur: 30 mg/dL — AB
Specific Gravity, Urine: 1.031 — ABNORMAL HIGH (ref 1.005–1.030)
WBC, UA: NONE SEEN WBC/hpf (ref 0–5)
pH: 5 (ref 5.0–8.0)

## 2021-04-16 LAB — TROPONIN I (HIGH SENSITIVITY)
Troponin I (High Sensitivity): 13 ng/L (ref <18)
Troponin I (High Sensitivity): 14 ng/L (ref <18)

## 2021-04-16 LAB — MAGNESIUM: Magnesium: 1.8 mg/dL (ref 1.7–2.4)

## 2021-04-16 LAB — RESP PANEL BY RT-PCR (FLU A&B, COVID) ARPGX2
Influenza A by PCR: NEGATIVE
Influenza B by PCR: NEGATIVE
SARS Coronavirus 2 by RT PCR: NEGATIVE

## 2021-04-16 LAB — T4, FREE: Free T4: 1.29 ng/dL — ABNORMAL HIGH (ref 0.61–1.12)

## 2021-04-16 LAB — CK: Total CK: 144 U/L (ref 38–234)

## 2021-04-16 MED ORDER — ATORVASTATIN CALCIUM 20 MG PO TABS
20.0000 mg | ORAL_TABLET | Freq: Every day | ORAL | Status: DC
  Administered 2021-04-17 – 2021-04-19 (×3): 20 mg via ORAL
  Filled 2021-04-16 (×3): qty 1

## 2021-04-16 MED ORDER — PANTOPRAZOLE SODIUM 40 MG PO TBEC
40.0000 mg | DELAYED_RELEASE_TABLET | Freq: Every day | ORAL | Status: DC
  Administered 2021-04-17 – 2021-04-18 (×2): 40 mg via ORAL
  Filled 2021-04-16 (×3): qty 1

## 2021-04-16 MED ORDER — IOHEXOL 350 MG/ML SOLN: 75.0000 mL | Freq: Once | INTRAVENOUS | Status: DC | PRN

## 2021-04-16 MED ORDER — INSULIN ASPART 100 UNIT/ML ~~LOC~~ SOLN
0.0000 [IU] | Freq: Every day | SUBCUTANEOUS | Status: DC
  Administered 2021-04-18: 2 [IU] via SUBCUTANEOUS
  Filled 2021-04-16: qty 1

## 2021-04-16 MED ORDER — MORPHINE SULFATE (PF) 2 MG/ML IV SOLN
1.0000 mg | INTRAVENOUS | Status: DC | PRN
  Administered 2021-04-16 – 2021-04-17 (×3): 1 mg via INTRAVENOUS
  Filled 2021-04-16 (×3): qty 1

## 2021-04-16 MED ORDER — INSULIN GLARGINE 100 UNIT/ML ~~LOC~~ SOLN: 20.0000 [IU] | Freq: Every day | SUBCUTANEOUS | Status: DC

## 2021-04-16 MED ORDER — LISINOPRIL 5 MG PO TABS
10.0000 mg | ORAL_TABLET | Freq: Every day | ORAL | Status: DC
  Administered 2021-04-17 – 2021-04-19 (×3): 10 mg via ORAL
  Filled 2021-04-16 (×3): qty 2

## 2021-04-16 MED ORDER — INSULIN ASPART 100 UNIT/ML ~~LOC~~ SOLN
0.0000 [IU] | Freq: Three times a day (TID) | SUBCUTANEOUS | Status: DC
  Administered 2021-04-17 (×2): 2 [IU] via SUBCUTANEOUS
  Administered 2021-04-18: 3 [IU] via SUBCUTANEOUS
  Administered 2021-04-18: 5 [IU] via SUBCUTANEOUS
  Administered 2021-04-19: 3 [IU] via SUBCUTANEOUS
  Administered 2021-04-19: 2 [IU] via SUBCUTANEOUS
  Filled 2021-04-16 (×6): qty 1

## 2021-04-16 MED ORDER — LEVOTHYROXINE SODIUM 100 MCG PO TABS
100.0000 ug | ORAL_TABLET | ORAL | Status: DC
  Administered 2021-04-17 – 2021-04-19 (×3): 100 ug via ORAL
  Filled 2021-04-16 (×4): qty 1

## 2021-04-16 MED ORDER — TACROLIMUS 1 MG PO CAPS
1.5000 mg | ORAL_CAPSULE | Freq: Every day | ORAL | Status: DC
  Administered 2021-04-17 – 2021-04-19 (×3): 1.5 mg via ORAL
  Filled 2021-04-16 (×3): qty 1

## 2021-04-16 MED ORDER — TACROLIMUS 0.5 MG PO CAPS: 0.5000 mg | ORAL_CAPSULE | ORAL | Status: DC

## 2021-04-16 MED ORDER — TACROLIMUS 1 MG PO CAPS
1.0000 mg | ORAL_CAPSULE | Freq: Every day | ORAL | Status: DC
  Administered 2021-04-17 – 2021-04-18 (×3): 1 mg via ORAL
  Filled 2021-04-16 (×6): qty 1

## 2021-04-16 MED ORDER — PREDNISONE 10 MG PO TABS
5.0000 mg | ORAL_TABLET | Freq: Every day | ORAL | Status: DC
  Administered 2021-04-17 – 2021-04-19 (×3): 5 mg via ORAL
  Filled 2021-04-16 (×3): qty 1

## 2021-04-16 MED ORDER — SODIUM CHLORIDE 0.9 % IV BOLUS
500.0000 mL | Freq: Once | INTRAVENOUS | Status: AC
  Administered 2021-04-16: 500 mL via INTRAVENOUS

## 2021-04-16 MED ORDER — INSULIN GLARGINE 100 UNIT/ML ~~LOC~~ SOLN
15.0000 [IU] | Freq: Every day | SUBCUTANEOUS | Status: DC
  Administered 2021-04-17 – 2021-04-18 (×2): 15 [IU] via SUBCUTANEOUS
  Filled 2021-04-16 (×2): qty 0.15

## 2021-04-16 MED ORDER — ACETAMINOPHEN 500 MG PO TABS
1000.0000 mg | ORAL_TABLET | Freq: Once | ORAL | Status: AC
  Administered 2021-04-16: 1000 mg via ORAL
  Filled 2021-04-16: qty 2

## 2021-04-16 MED ORDER — EVEROLIMUS 0.5 MG PO TABS
1.0000 mg | ORAL_TABLET | Freq: Two times a day (BID) | ORAL | Status: DC
  Administered 2021-04-17 – 2021-04-19 (×5): 1 mg via ORAL
  Filled 2021-04-16 (×8): qty 2

## 2021-04-16 MED ORDER — ACETAMINOPHEN 325 MG PO TABS
650.0000 mg | ORAL_TABLET | Freq: Four times a day (QID) | ORAL | Status: DC | PRN
  Administered 2021-04-19: 650 mg via ORAL
  Filled 2021-04-16: qty 2

## 2021-04-16 MED ORDER — SODIUM CHLORIDE 0.9 % IV SOLN: 10.0000 mL/h | Freq: Once | INTRAVENOUS | Status: DC

## 2021-04-16 NOTE — ED Triage Notes (Signed)
Patient arrived via EMS for an unwitnessed fall 1 week ago. Pt hit her head and is on blood thinners. According to family pt has been increasingly more confused. Pt c/o right leg swelling and back pain. Hx of AFIB.

## 2021-04-16 NOTE — H&P (Signed)
History and Physical    Cindy Fischer:[id number] DOB: July 11, 1946 DOA: 04/16/2021  PCP: Albina Billet, MD  Patient coming from: Home, sister at bedside  I have personally briefly reviewed patient's old medical records in [place]  Chief Complaint: fall  HPI: Cindy Fischer is a 75 y.o. 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  Patient not a great historian since she appears to be confused about the timeline of her falls.  Reports that she woke up on the floor this morning with severe back pain.  Last thing she remembered was reading a book at bedtime the night before.  Thinks she might have had urinary incontinence but later states that it was with a different fall.  Reportedly she had 2 falls in the last couple weeks but cannot remember the exact mechanism of the fall although thinks her legs gave out.  States she might have hit her head during one of them.  Patient lives alone and sister at bedside last saw her on Sunday and thought that she was less active than usual and was staying in bed.  Then yesterday a different sister went to visit her and thought she appeared confused.  Today she had a friend mow her lawn and he was concerned she did not answer her door.  When they found her she had already returned back to bed from her fall but was in severe pain.  ED Course: She was afebile but mildly tachycardic and normotensive on room air. Has leukocytosis of 13.3. Plt of 734 which is chronic and had been upward trending in the past 2 years.  CT head showed subacute subdural hematoma with midline shift  CT chest and Lumbar spine shows age indeterminate T12 and L1 compression deformities  Nondisplaced left  anterior 4th and 5th rib fx. No pneumothorax.   ED physician Dr. Jari Pigg discussed with neurosurgery Dr. Marsa Aris who recommended admission for observation for Cindy seizure activity and repeat CT head in the morning. He initially also recommended 1u of platelet transfusion but due to her chronic thrombocytosis he recommended holding off and to obtain platelet assay first.   ED physician also discussed Plavix with vascular surgery since pt recently had right LE angioplasty with popliteal artery and Left LE common femoral artery stent placed on 03/02/21 and they recommend holding Plavix.   Review of Systems:  Constitutional: No Weight Change, No Fever ENT/Mouth: No sore throat, No Rhinorrhea Eyes: No Eye Pain, No Vision Changes Cardiovascular: No Chest Pain, no SOB Respiratory: No Cough, No Sputum  Gastrointestinal: No Nausea, No Vomiting, No Diarrhea, No Constipation, No Pain Genitourinary: no Urinary Incontinence,  Musculoskeletal: +cervical, thoracic and lumbar back pain Skin: No Skin Lesions, No Pruritus, Neuro: + Weakness, No Numbness,  + Loss of Consciousness, No Syncope Psych: No Anxiety/Panic, No Depression, no decrease appetite Heme/Lymph: No Bruising, No Bleeding  Past Medical History:  Diagnosis Date  . Cancer ([place]) 2016   skin (nose)  . Diabetes mellitus without complication ([place])   . Hypothyroidism   . Pneumonia   . Renal disorder     Past Surgical History:  Procedure Laterality Date  . CATARACT EXTRACTION, BILATERAL    . COLONOSCOPY  12/02/2010  . COLONOSCOPY WITH PROPOFOL N/A 09/04/2016  Procedure: COLONOSCOPY WITH PROPOFOL;  Surgeon: Christene Lye, MD;  Location: [facility] ENDOSCOPY;  Service: Endoscopy;  Laterality: N/A;  . EYE SURGERY    . INSERTION OF DIALYSIS CATHETER  2014   removed 2014 (only had in for about 8 weeks)  . laser surgery on eyes  1999  . LOWER EXTREMITY ANGIOGRAPHY Right 10/16/2018   Procedure: LOWER EXTREMITY ANGIOGRAPHY;  Surgeon: Katha Cabal, MD;  Location: [facility];  Service: Cardiovascular;  Laterality: Right;  . LOWER EXTREMITY ANGIOGRAPHY Right 02/08/2021   Procedure: LOWER EXTREMITY ANGIOGRAPHY;  Surgeon: Katha Cabal, MD;  Location: [facility];  Service: Cardiovascular;  Laterality: Right;  . LOWER EXTREMITY ANGIOGRAPHY Right 03/02/2021   Procedure: LOWER EXTREMITY ANGIOGRAPHY;  Surgeon: Katha Cabal, MD;  Location: [facility];  Service: Cardiovascular;  Laterality: Right;  . 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 SPINE SURGERY  11/10/2021  . TONSILLECTOMY    . TUBAL LIGATION       reports that she has quit smoking. Her smoking use included cigarettes. She has never used smokeless tobacco. She reports that she does not drink alcohol and does not use drugs. Social History  Allergies  Allergen Reactions  . Latex Itching  . Voltaren [Diclofenac Sodium] Diarrhea and Nausea And Vomiting  . Oxycodone-Acetaminophen Nausea And Vomiting  . Penicillins Rash    Has patient had a PCN reaction causing immediate rash, facial/tongue/throat swelling, SOB or lightheadedness with hypotension: Yes Has patient had a PCN reaction causing severe rash involving mucus membranes or skin necrosis: No Has patient had a PCN reaction that required hospitalization: No Has patient had a PCN reaction occurring within the last 10 years: No If all of the above answers are "NO", then may proceed with Cephalosporin use.     Family History  Problem Relation Age of Onset  . Multiple myeloma Mother   . Diabetes Father   . Heart attack Father   . Breast cancer Neg Hx      Prior to Admission medications   Medication Sig Start Date End Date Taking? Authorizing Provider  acetaminophen (TYLENOL) 650 MG CR tablet Take 1,300 mg by mouth every 8 (eight) hours as needed for pain.    [provider]  aspirin EC 81 MG tablet Take 81 mg by mouth daily.    [provider]   atorvastatin (LIPITOR) 20 MG tablet Take 20 mg by mouth daily.    [provider]  Cholecalciferol (VITAMIN D) 2000 units tablet Take 2,000 Units by mouth daily.    [provider]  clopidogrel (PLAVIX) 75 MG tablet Take 1 tablet (75 mg total) by mouth daily. 02/09/21   Schnier, Dolores Lory, MD  collagenase (SANTYL) ointment Apply 1 application topically daily. Apply nickel thick to wounds followed by saline moistened gauze and dry gauze Patient not taking: No sig reported 02/28/21   Criselda Peaches, DPM  Everolimus 0.5 MG TABS Take 1 mg by mouth 2 (two) times daily.    [provider]  insulin NPH Human (HUMULIN N,NOVOLIN N) 100 UNIT/ML injection Inject 30 Units into the skin daily before breakfast.    [provider]  insulin regular (NOVOLIN R,HUMULIN R) 100 units/mL injection Inject 3-10 Units into the skin 3 (three) times daily as needed for high blood sugar. Sliding scale    [provider]  levothyroxine (SYNTHROID, LEVOTHROID) 100 MCG tablet Take 100 mcg by mouth See admin instructions. Take 100 mcg daily  except skip dose on Saturdays    [provider]  lisinopril (PRINIVIL,ZESTRIL) 10 MG tablet Take 10 mg by mouth daily.    [provider]  omeprazole (PRILOSEC) 40 MG capsule Take 40 mg by mouth every other day.  06/26/18   [provider]  Polyethyl Glycol-Propyl Glycol (SYSTANE ULTRA OP) Place 1 drop into both eyes daily as needed (Dry eyes).    [provider]  predniSONE (DELTASONE) 5 MG tablet Take 5 mg by mouth daily.  05/08/18   [provider]  sulfamethoxazole-trimethoprim (BACTRIM DS) 800-160 MG tablet Take 1 tablet by mouth 2 (two) times daily. Patient not taking: Reported on 03/24/2021 03/09/21   Criselda Peaches, DPM  tacrolimus (PROGRAF) 0.5 MG capsule Take 1-1.5 mg by mouth See admin instructions. Take 1.5 mg in the morning and 1 mg in the evening    [provider]    Physical  Exam: Vitals:   04/16/21 1530 04/16/21 1630 04/16/21 1830 04/16/21 1945  BP: (!) 150/64 (!) 128/54 130/60 127/76  Pulse: (!) 111 80 (!) 123 96  Resp: '16 16 15 20  ' Temp:      TempSrc:      SpO2: 100% 100% 97% 99%  Weight:      Height:        Constitutional: NAD, calm, comfortable, elderly female lying flat in bed Vitals:   04/16/21 1530 04/16/21 1630 04/16/21 1830 04/16/21 1945  BP: (!) 150/64 (!) 128/54 130/60 127/76  Pulse: (!) 111 80 (!) 123 96  Resp: '16 16 15 20  ' Temp:      TempSrc:      SpO2: 100% 100% 97% 99%  Weight:      Height:       Eyes: PERRL, lids and conjunctivae normal ENMT: Mucous membranes are moist.  Neck: normal, supple Respiratory: clear to auscultation bilaterally, no wheezing, no crackles. Normal respiratory effort. No accessory muscle use.  Cardiovascular: Regular rate and rhythm, no murmurs / rubs / gallops.  Circumferential nonpitting edema of the right lower extremity with mild erythema .  abdomen: no tenderness, no masses palpated.Bowel sounds positive.  Musculoskeletal: no clubbing / cyanosis. No joint deformity upper and lower extremities. Good ROM, no contractures. Normal muscle tone.  Skin: no rashes, lesions, ulcers. No induration Neurologic: CN 2-12 grossly intact. Sensation intact,  Strength 5/5 in all 4.  Alert and oriented to self, place and time but has confusion regarding family member at bedside and current event. Psychiatric:  Alert and oriented x 3.  Confused at times.     Labs on Admission: I have personally reviewed following labs and imaging studies  CBC: Recent Labs  Lab 04/16/21 1538  WBC 13.3*  NEUTROABS 11.6*  HGB 12.8  HCT 38.4  MCV 89.1  PLT 606*   Basic Metabolic Panel: Recent Labs  Lab 04/16/21 1538  NA 135  K 4.7  CL 102  CO2 22  GLUCOSE 128*  BUN 25*  CREATININE 0.75  CALCIUM 9.3  MG 1.8   GFR: Estimated Creatinine Clearance: 56.3 mL/min (by C-G formula based on SCr of 0.75 mg/dL). Liver Function  Tests: Recent Labs  Lab 04/16/21 1538  AST 30  ALT 14  ALKPHOS 67  BILITOT 0.9  PROT 6.2*  ALBUMIN 3.5   No results for input(s): LIPASE, AMYLASE in the last 168 hours. No results for input(s): AMMONIA in the last 168 hours. Coagulation Profile: No results for input(s): INR, PROTIME in the last 168 hours. Cardiac  Enzymes: Recent Labs  Lab 04/16/21 1538  CKTOTAL 144   BNP (last 3 results) No results for input(s): PROBNP in the last 8760 hours. HbA1C: No results for input(s): HGBA1C in the last 72 hours. CBG: No results for input(s): GLUCAP in the last 168 hours. Lipid Profile: No results for input(s): CHOL, HDL, LDLCALC, TRIG, CHOLHDL, LDLDIRECT in the last 72 hours. Thyroid Function Tests: Recent Labs    04/16/21 1538  TSH 2.040  FREET4 1.29*   Anemia Panel: No results for input(s): VITAMINB12, FOLATE, FERRITIN, TIBC, IRON, RETICCTPCT in the last 72 hours. Urine analysis:    Component Value Date/Time   COLORURINE YELLOW 10/29/2015 1014   APPEARANCEUR CLEAR 10/29/2015 1014   LABSPEC 1.015 10/29/2015 1014   PHURINE 6.5 10/29/2015 1014   GLUCOSEU NEGATIVE 10/29/2015 1014   HGBUR 1+ (A) 10/29/2015 1014   BILIRUBINUR NEGATIVE 10/29/2015 1014   KETONESUR NEGATIVE 10/29/2015 1014   PROTEINUR NEGATIVE 10/29/2015 1014   NITRITE NEGATIVE 10/29/2015 1014   LEUKOCYTESUR NEGATIVE 10/29/2015 1014    Radiological Exams on Admission: CT Head Wo Contrast  Result Date: 04/16/2021 CLINICAL DATA:  Unwitnessed fall 1 week ago.  Increasing confusion EXAM: CT HEAD WITHOUT CONTRAST CT CERVICAL SPINE WITHOUT CONTRAST TECHNIQUE: Multidetector CT imaging of the head and cervical spine was performed following the standard protocol without intravenous contrast. Multiplanar CT image reconstructions of the cervical spine were also generated. COMPARISON:  None. FINDINGS: CT HEAD FINDINGS Brain: Subacute appearing subdural collection overlying the left cerebral convexity measuring up to 6-7  mm in thickness (series 3, image 13). 4 mm left to right midline shift. No evidence of intraparenchymal hemorrhage. No hydrocephalus. No mass lesion identified. Mild-moderate low-density changes within the periventricular and subcortical white matter compatible with chronic microvascular ischemic change. Mild diffuse cerebral volume loss. Vascular: Atherosclerotic calcifications involving the large vessels of the skull base. No unexpected hyperdense vessel. Skull: Normal. Negative for fracture or focal lesion. Sinuses/Orbits: Mild bilateral ethmoid mucosal thickening. Other: Negative for scalp hematoma. CT CERVICAL SPINE FINDINGS Alignment: Facet joints are aligned without dislocation or traumatic listhesis. Dens and lateral masses are aligned. Skull base and vertebrae: No acute fracture. No primary bone lesion or focal pathologic process. Soft tissues and spinal canal: No prevertebral fluid or swelling. No visible canal hematoma. Disc levels: Multilevel degenerative disc disease within the cervical spine most pronounced from C4-5 through C7-T1. Mild multilevel facet arthropathy. No evidence of high-grade canal stenosis by CT. Upper chest: Included lung apices are clear. Other: None. IMPRESSION: 1. Subacute appearing subdural hematoma overlying the left cerebral convexity measuring up to 6-7 mm in thickness with associated 4 mm left to right midline shift. 2. Chronic microvascular ischemic changes and cerebral volume loss. 3. No evidence of acute fracture or traumatic listhesis of the cervical spine. 4. Multilevel degenerative disc disease and facet arthropathy of the cervical spine. Critical Value/emergent results were called by telephone at the time of interpretation on 04/16/2021 at 5:28 pm to provider [facility] , who verbally acknowledged these results. Electronically Signed   By: Davina Poke D.O.   On: 04/16/2021 17:30   CT Cervical Spine Wo Contrast  Result Date: 04/16/2021 CLINICAL DATA:  Unwitnessed  fall 1 week ago.  Increasing confusion EXAM: CT HEAD WITHOUT CONTRAST CT CERVICAL SPINE WITHOUT CONTRAST TECHNIQUE: Multidetector CT imaging of the head and cervical spine was performed following the standard protocol without intravenous contrast. Multiplanar CT image reconstructions of the cervical spine were also generated. COMPARISON:  None. FINDINGS: CT HEAD  FINDINGS Brain: Subacute appearing subdural collection overlying the left cerebral convexity measuring up to 6-7 mm in thickness (series 3, image 13). 4 mm left to right midline shift. No evidence of intraparenchymal hemorrhage. No hydrocephalus. No mass lesion identified. Mild-moderate low-density changes within the periventricular and subcortical white matter compatible with chronic microvascular ischemic change. Mild diffuse cerebral volume loss. Vascular: Atherosclerotic calcifications involving the large vessels of the skull base. No unexpected hyperdense vessel. Skull: Normal. Negative for fracture or focal lesion. Sinuses/Orbits: Mild bilateral ethmoid mucosal thickening. Other: Negative for scalp hematoma. CT CERVICAL SPINE FINDINGS Alignment: Facet joints are aligned without dislocation or traumatic listhesis. Dens and lateral masses are aligned. Skull base and vertebrae: No acute fracture. No primary bone lesion or focal pathologic process. Soft tissues and spinal canal: No prevertebral fluid or swelling. No visible canal hematoma. Disc levels: Multilevel degenerative disc disease within the cervical spine most pronounced from C4-5 through C7-T1. Mild multilevel facet arthropathy. No evidence of high-grade canal stenosis by CT. Upper chest: Included lung apices are clear. Other: None. IMPRESSION: 1. Subacute appearing subdural hematoma overlying the left cerebral convexity measuring up to 6-7 mm in thickness with associated 4 mm left to right midline shift. 2. Chronic microvascular ischemic changes and cerebral volume loss. 3. No evidence of acute  fracture or traumatic listhesis of the cervical spine. 4. Multilevel degenerative disc disease and facet arthropathy of the cervical spine. Critical Value/emergent results were called by telephone at the time of interpretation on 04/16/2021 at 5:28 pm to provider [facility] , who verbally acknowledged these results. Electronically Signed   By: Davina Poke D.O.   On: 04/16/2021 17:30   US Venous Img Lower Bilateral  Result Date: 04/16/2021 CLINICAL DATA:  Swelling EXAM: BILATERAL LOWER EXTREMITY VENOUS DOPPLER ULTRASOUND TECHNIQUE: Gray-scale sonography with compression, as well as color and duplex ultrasound, were performed to evaluate the deep venous system(s) from the level of the common femoral vein through the popliteal and proximal calf veins. COMPARISON:  None. FINDINGS: VENOUS Normal compressibility of the common femoral, superficial femoral, and popliteal veins, as well as the visualized calf veins. Visualized portions of profunda femoral vein and great saphenous vein unremarkable. No filling defects to suggest DVT on grayscale or color Doppler imaging. Doppler waveforms show normal direction of venous flow, normal respiratory plasticity and response to augmentation. Limited views of the contralateral common femoral vein are unremarkable. OTHER None. Limitations: none IMPRESSION: Negative. Electronically Signed   By: Dorise Bullion III M.D   On: 04/16/2021 17:58   CT T-SPINE NO CHARGE  Result Date: 04/16/2021 CLINICAL DATA:  Fall 1 week ago with back pain EXAM: CT THORACIC AND LUMBAR SPINE WITHOUT CONTRAST TECHNIQUE: Multidetector CT imaging of the thoracic and lumbar spine was performed without contrast. Multiplanar CT image reconstructions were also generated. COMPARISON:  None. FINDINGS: CT THORACIC SPINE FINDINGS Alignment: Mild dextroconvex curvature of the thoracic spine. No evidence of traumatic listhesis. Vertebrae: Anterior superior compression deformity of the T12 vertebral body with  approximately 25% height loss. Paraspinal and other soft tissues: Negative. Disc levels: Multilevel degenerative change of the thoracic spine without significant neural foraminal or canal narrowing. CT LUMBAR SPINE FINDINGS Segmentation: 5 lumbar type vertebrae. Alignment: No evidence of traumatic listhesis. Vertebrae: Age-indeterminate anterior superior compression deformity at L1 with approximately 40% height loss. Paraspinal and other soft tissues: Negative. Disc levels: Multilevel degenerative changes spine most significant at L2-L3 and L3-L4 with at least moderate neural foraminal narrowing at L3-L4 on the right as  well as L2-L3 on the left. No significant canal narrowing. IMPRESSION: CT THORACIC SPINE IMPRESSION Age-indeterminate anterior superior compression deformity at T12 vertebral body with approximately 25% height loss. Multilevel degenerative change of the spine without significant neural foraminal or canal narrowing. CT LUMBAR SPINE IMPRESSION Age-indeterminate anterior superior compression deformity at L1 with approximately 40% height loss. Multilevel degenerative changes of the thoracic and lumbar spine, most significant at L2-L3 and L3-L4 with at least moderate neural foraminal narrowing at L3-L4 on the right as well as L2-L3 on the left. Electronically Signed   By: Dahlia Bailiff MD   On: 04/16/2021 17:41   CT L-SPINE NO CHARGE  Result Date: 04/16/2021 CLINICAL DATA:  Fall 1 week ago with back pain EXAM: CT THORACIC AND LUMBAR SPINE WITHOUT CONTRAST TECHNIQUE: Multidetector CT imaging of the thoracic and lumbar spine was performed without contrast. Multiplanar CT image reconstructions were also generated. COMPARISON:  None. FINDINGS: CT THORACIC SPINE FINDINGS Alignment: Mild dextroconvex curvature of the thoracic spine. No evidence of traumatic listhesis. Vertebrae: Anterior superior compression deformity of the T12 vertebral body with approximately 25% height loss. Paraspinal and other soft  tissues: Negative. Disc levels: Multilevel degenerative change of the thoracic spine without significant neural foraminal or canal narrowing. CT LUMBAR SPINE FINDINGS Segmentation: 5 lumbar type vertebrae. Alignment: No evidence of traumatic listhesis. Vertebrae: Age-indeterminate anterior superior compression deformity at L1 with approximately 40% height loss. Paraspinal and other soft tissues: Negative. Disc levels: Multilevel degenerative changes spine most significant at L2-L3 and L3-L4 with at least moderate neural foraminal narrowing at L3-L4 on the right as well as L2-L3 on the left. No significant canal narrowing. IMPRESSION: CT THORACIC SPINE IMPRESSION Age-indeterminate anterior superior compression deformity at T12 vertebral body with approximately 25% height loss. Multilevel degenerative change of the spine without significant neural foraminal or canal narrowing. CT LUMBAR SPINE IMPRESSION Age-indeterminate anterior superior compression deformity at L1 with approximately 40% height loss. Multilevel degenerative changes of the thoracic and lumbar spine, most significant at L2-L3 and L3-L4 with at least moderate neural foraminal narrowing at L3-L4 on the right as well as L2-L3 on the left. Electronically Signed   By: Dahlia Bailiff MD   On: 04/16/2021 17:41   CT CHEST ABDOMEN PELVIS WO CONTRAST  Result Date: 04/16/2021 CLINICAL DATA:  Fall 1 week ago with back pain and right leg swelling. EXAM: CT CHEST, ABDOMEN AND PELVIS WITHOUT CONTRAST TECHNIQUE: Multidetector CT imaging of the chest, abdomen and pelvis was performed following the standard protocol without IV contrast. COMPARISON:  None. FINDINGS: CT CHEST FINDINGS Cardiovascular: Aortic atherosclerosis. No thoracic aortic aneurysm. Three vessel dense coronary artery calcifications. Calcifications of the mitral annulus and aortic valve. Normal size heart. Trace pericardial fluid, likely physiologic. Mediastinum/Nodes: No discrete thyroid  nodularity. No pathologically enlarged mediastinal or axillary lymph nodes. The trachea and esophagus are grossly unremarkable. Small hiatal hernia. Lungs/Pleura: Right middle lobe and lingular atelectasis. No evidence of pulmonary contusion or hemorrhage. No focal consolidation. No pleural effusion. No pneumothorax. Musculoskeletal: Nondisplaced left anterior fourth and fifth rib fractures. CT ABDOMEN PELVIS FINDINGS Hepatobiliary: Unremarkable noncontrast appearance of the hepatic parenchyma. Gallbladder is unremarkable. No biliary ductal dilation. Pancreas: Fatty pancreatic atrophy. Spleen: No splenic injury or perisplenic hematoma. Adrenals/Urinary Tract: Left adrenal thickening. Right adrenal glands unremarkable. Native bilateral kidneys are atrophic with calcifications without hydronephrosis. Right lower quadrant transplant kidney. No transplant kidney hydronephrosis. 3 mm nonobstructive stone in the upper pole of the transplant kidney. Urinary bladder is grossly unremarkable for degree of  distension. Stomach/Bowel: Small hiatal hernia otherwise the stomach is grossly unremarkable. No suspicious small bowel dilation. No no evidence of acute of colonic inflammation or obstruction. Vascular/Lymphatic: Aortic atherosclerosis. No abdominal aortic aneurysm. Vascular stents versus dense calcifications in the bilateral common iliac arteries. No enlarged abdominal or pelvic lymph nodes. Reproductive: Uterus and bilateral adnexa are unremarkable. Other: No abdominopelvic ascites. Postsurgical change in the right anterior abdominopelvic wall. Musculoskeletal: Age indeterminate anterior superior endplate compression deformities at T12 and L1 with approximately 25% height loss at T12 and 40% height loss at L1. No evidence of traumatic listhesis. Multilevel degenerative change of the spine most significant at L2-L3 and L3-L4. IMPRESSION: 1. Nondisplaced left anterior fourth and fifth rib fractures. No pneumothorax. 2. Age  indeterminate anterior superior endplate compression deformities at T12 and L1 with approximately 25% height loss at T12 and 40% height loss at L1. 3. Right lower quadrant transplant kidney without hydronephrosis. 3 mm nonobstructive stone in the upper pole of the transplant kidney. 4. Small hiatal hernia. 5. Aortic atherosclerosis. Aortic Atherosclerosis (ICD10-I70.0). Electronically Signed   By: Dahlia Bailiff MD   On: 04/16/2021 17:42      Assessment/Plan  Subdural hematoma bleed -s/p multiple falls likely due to LE claudication and on Plavix  -6-70m thickness with 420mleft to right midline shift -pt alert and oriented to self, place and time but confused about family at bedside and having memory issues with current event - Frequent q2hr neurochecks - Place on seizure precaution - repeat CT head in the morning  - Neurosurgery Dr. CoMarsa Ariss aware and will consult in the morning   T12 and L1 compression fracture - age indetermine. Neurosurgery Dr. CoMarsa Ariso look at imaging and give recommendation of whether she will benefit from TLSO brace - PRN pain control  Nondisplaced left anterior fourth and fifth rib fracture - PRN pain control  Thrombocytosis - Plt >700. Chronic and was uptrending since 2020 - Neurosurgery initially recommend platelet transfusion but decided to hold off due to elevation and recommended obtain platelet assay  Rt upper pole Nephrolithiasis w/hx of kidney transplant for previous ESRD on HD - continue prednisone, Tacrolimus and Everolimus  [facility] the [facility] regarding this new kidney stone ([id number])  PAD s/p bilateral LE and iliac stents  -last angioplasty in March -appears to be on Plavix and aspirin  -ED physician spoke with vascular surgery Dr. AlbLang Snowd he recommended holding  given subdural hematoma bleed  LE lymphedema Bilateral venous Doppler ultrasound negative  Diabetes On  30 units NPH at breakfast at home with sliding scale  start with 20 U Lantus in the morning with moderate SSI  Hypertension continue Lisinopril  GERD Continue PPI  Hypothyroidism Continue levothyroxine  DVT prophylaxis:.SCDs Code Status: Full Family Communication: Plan discussed with patient and sister at bedside   Sister CarArbie [phone] MarStanton [phone] Plan: Home with at least 2 midnight stays  Consults called: Neurosurgery,curbsided vascular surgery  Admission status: inpatient  Level of care: Stepdown  Status is: Inpatient  Remains inpatient appropriate because:Inpatient level of care appropriate due to severity of illness   Dispo: The patient is from: Home              Anticipated d/c is to: Home              Patient currently is not medically stable to d/c.   Difficult to place patient No  Orene Desanctis DO [facility]   If 7PM-7AM, please contact night-coverage [email or web address]   04/16/2021, 7:52 PM

## 2021-04-16 NOTE — ED Provider Notes (Signed)
[facility] Emergency Department Provider Note  ____________________________________________   Event Date/Time   First MD Initiated Contact with Patient 04/16/21 1454     (approximate)  I have reviewed the triage vital signs and the nursing notes.   HISTORY  Chief Complaint Fall    HPI Cindy Fischer is a 75 y.o. female with diabetes,, kidney transplant atrial fibrillation on blood thinners who comes in for unwitnessed fall 1 week ago.  According to family patient has been increasingly more confused.  This was report from EMS.  According to patient she states that she fell few weeks ago.  The sister is who is with her states that she last saw her a week ago when she was normal and then yesterday another sister saw her and showed that she was more confused than normal.  Patient states that she woke up on the floor this morning and she is not sure how she got there.  She reports pain all over in her chest, abdomen, back pain, diffuse, moderate, constant, worse with movement, better at rest and she does not feel like she is confused but is having a hard time answering my questions and seems to be getting frustrated with with that.  Patient does have right leg swelling but she states that that has been constant since prior surgery.  I did review of patient's chart and on 03/02/2021 patient had a angioplasty and stent placed into the right leg.  Patient has a good pulse at this time.  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  Patient not sure what medication she is on she is unclear if she is taking blood thinner.  On review of her records it appears that patient is on Plavix.          Past Medical History:  Diagnosis Date  . Cancer ([place]) 2016   skin (nose)  . Diabetes mellitus without complication ([place])   . Hypothyroidism   . Pneumonia   . Renal disorder     Patient Active Problem List   Diagnosis Date Noted  . Backache 01/20/2021  . Edema 01/20/2021  .  Hypertensive disorder 01/20/2021  . Lumbar spondylosis with myelopathy 06/10/2020  . Lymphedema 03/22/2020  . GERD (gastroesophageal reflux disease) 02/09/2019  . Leg pain 08/17/2018  . Atherosclerosis of native arteries of extremity with intermittent claudication ([place]) 08/17/2018  . Renal transplant, status post 08/17/2018  . Diabetes ([place]) 08/17/2018  . Hyperlipidemia 08/17/2018  . Numbness and tingling 07/16/2018  . Tingling 07/16/2018  . Erythrocytosis 09/24/2015  . Thrombocytosis 09/24/2015  . Breast swelling 08/26/2015  . Immunosuppression ([place]) 07/22/2015  . History of nonmelanoma skin cancer 05/31/2015  . Aftercare following organ transplant 11/26/2014  . Hypothyroidism 11/26/2014  . Immunocompromised state ([place]) 11/26/2014  . Quit smoking within past year 11/26/2014  . Proliferative diabetic retinopathy ([place]) 04/10/2011    Past Surgical History:  Procedure Laterality Date  . CATARACT EXTRACTION, BILATERAL    . COLONOSCOPY  12/02/2010  . COLONOSCOPY WITH PROPOFOL N/A 09/04/2016   Procedure: COLONOSCOPY WITH PROPOFOL;  Surgeon: Christene Lye, MD;  Location: [facility] ENDOSCOPY;  Service: Endoscopy;  Laterality: N/A;  . EYE SURGERY    . INSERTION OF DIALYSIS CATHETER  2014   removed 2014 (only had in for about 8 weeks)  . laser surgery on eyes  1999  . LOWER EXTREMITY ANGIOGRAPHY Right 10/16/2018   Procedure: LOWER EXTREMITY ANGIOGRAPHY;  Surgeon: Katha Cabal, MD;  Location: [place] [facility];  Service: Cardiovascular;  Laterality: Right;  . LOWER EXTREMITY ANGIOGRAPHY Right 02/08/2021   Procedure: LOWER EXTREMITY ANGIOGRAPHY;  Surgeon: Katha Cabal, MD;  Location: [facility] [facility];  Service: Cardiovascular;  Laterality: Right;  . LOWER EXTREMITY ANGIOGRAPHY Right 03/02/2021   Procedure: LOWER EXTREMITY ANGIOGRAPHY;  Surgeon: Katha Cabal, MD;  Location: [place];  Service: Cardiovascular;  Laterality: Right;  . 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 SPINE SURGERY  11/10/2021  . TONSILLECTOMY    . TUBAL LIGATION      Prior to Admission medications   Medication Sig Start Date End Date Taking? Authorizing Provider  acetaminophen (TYLENOL) 650 MG CR tablet Take 1,300 mg by mouth every 8 (eight) hours as needed for pain.    [provider]  aspirin EC 81 MG tablet Take 81 mg by mouth daily.    [provider]  atorvastatin (LIPITOR) 20 MG tablet Take 20 mg by mouth daily.    [provider]  Cholecalciferol (VITAMIN D) 2000 units tablet Take 2,000 Units by mouth daily.    [provider]  clopidogrel (PLAVIX) 75 MG tablet Take 1 tablet (75 mg total) by mouth daily. 02/09/21   Schnier, Dolores Lory, MD  collagenase (SANTYL) ointment Apply 1 application topically daily. Apply nickel thick to wounds followed by saline moistened gauze and dry gauze Patient not taking: No sig reported 02/28/21   Criselda Peaches, DPM  Everolimus 0.5 MG TABS Take 1 mg by mouth 2 (two) times daily.    [provider]  insulin NPH Human (HUMULIN N,NOVOLIN N) 100 UNIT/ML injection Inject 30 Units into the skin daily before breakfast.    [provider]  insulin regular (NOVOLIN R,HUMULIN R) 100 units/mL injection Inject 3-10 Units into the skin 3 (three) times daily as needed for high blood sugar. Sliding scale    [provider]  levothyroxine (SYNTHROID, LEVOTHROID) 100 MCG tablet Take 100 mcg by mouth See admin instructions. Take 100 mcg daily except skip dose on Saturdays    [provider]  lisinopril (PRINIVIL,ZESTRIL) 10 MG tablet Take 10 mg by mouth daily.    [provider]  omeprazole (PRILOSEC) 40 MG capsule Take 40 mg by mouth every other day.  06/26/18   [provider]  Polyethyl Glycol-Propyl Glycol (SYSTANE ULTRA OP) Place 1 drop into both eyes daily as needed (Dry eyes).    [provider]  predniSONE (DELTASONE) 5 MG tablet Take  5 mg by mouth daily.  05/08/18   [provider]  sulfamethoxazole-trimethoprim (BACTRIM DS) 800-160 MG tablet Take 1 tablet by mouth 2 (two) times daily. Patient not taking: Reported on 03/24/2021 03/09/21   Criselda Peaches, DPM  tacrolimus (PROGRAF) 0.5 MG capsule Take 1-1.5 mg by mouth See admin instructions. Take 1.5 mg in the morning and 1 mg in the evening    [provider]    Allergies Latex, Voltaren [diclofenac sodium], Oxycodone-acetaminophen, and Penicillins  Family History  Problem Relation Age of Onset  . Multiple myeloma Mother   . Diabetes Father   . Heart attack Father   . Breast cancer Neg Hx     Social History Social History   Tobacco Use  . Smoking status: Former Smoker    Types: Cigarettes  . Smokeless tobacco: Never Used  Vaping Use  . Vaping Use: Never used  Substance Use Topics  . Alcohol use: No  . Drug use: No  Review of Systems Constitutional: No fever/chills, fall Eyes: No visual changes. ENT: No sore throat. Cardiovascular: Positive chest pain Respiratory: Denies shortness of breath. Gastrointestinal: Pos abdominal pain.  No nausea, no vomiting.  No diarrhea.  No constipation. Genitourinary: Negative for dysuria. Musculoskeletal: Pain all over Skin: Negative for rash. Neurological: Positive for headache, positive for confusion All other ROS negative ____________________________________________   PHYSICAL EXAM:  VITAL SIGNS: ED Triage Vitals  Enc Vitals Group     BP 04/16/21 1450 139/74     Pulse Rate 04/16/21 1450 94     Resp 04/16/21 1450 14     Temp 04/16/21 1450 99 F (37.2 C)     Temp Source 04/16/21 1450 Oral     SpO2 04/16/21 1446 98 %     Weight 04/16/21 1452 158 lb 1.1 oz (71.7 kg)     Height 04/16/21 1452 '5\' 2"'  (1.575 m)     Head Circumference --      Peak Flow --      Pain Score --      Pain Loc --      Pain Edu? --      Excl. in [place]? --     Constitutional: Alert and oriented x3 . But  appears confused  Eyes: Conjunctivae are normal. EOMI. Head: Atraumatic. Nose: No congestion/rhinnorhea. Mouth/Throat: Mucous membranes are moist.   Neck: No stridor. Trachea Midline. FROM Cardiovascular: Normal rate, regular rhythm. Grossly normal heart sounds.  Good peripheral circulation.  Chest wall tenderness Respiratory: Normal respiratory effort.  No retractions. Lungs CTAB. Gastrointestinal: Tenderness in her abdomen no distention. No abdominal bruits.  Musculoskeletal: No lower extremity tenderness nor edema.  No joint effusions.  Patient reports tenderness in all her joints but still able to range them and move them.  Right leg is more swollen than left.  Distal pulse intact.  Ulcer noted on the bottom of the foot without any erythema.  Warm leg does feel little bit of warm to touch but no obvious redness. Neurologic:  Normal speech and language although patient appears confused. No gross focal neurologic deficits are appreciated.  Patient able to move all extremities. Skin:  Skin is warm, dry and intact. No rash noted. Psychiatric: Mood and affect are normal. Speech and behavior are normal. GU: Deferred   ____________________________________________   LABS (all labs ordered are listed, but only abnormal results are displayed)  Labs Reviewed  CBC WITH DIFFERENTIAL/PLATELET - Abnormal; Notable for the following components:      Result Value   WBC 13.3 (*)    Platelets 734 (*)    Neutro Abs 11.6 (*)    Abs Immature Granulocytes 0.11 (*)    All other components within normal limits  RESP PANEL BY RT-PCR (FLU A&B, COVID) ARPGX2  COMPREHENSIVE METABOLIC PANEL  CK  MAGNESIUM  TSH  T4, FREE  URINALYSIS, COMPLETE (UACMP) WITH MICROSCOPIC  TROPONIN I (HIGH SENSITIVITY)   ____________________________________________   ED ECG REPORT I, Vanessa [place], the attending physician, personally viewed and interpreted this ECG.  Atrial fibrillation rate of 111, no ST elevation, no T  wave inversion, right bundle branch block ____________________________________________  RADIOLOGY I, Vanessa [place], personally viewed and evaluated these images (plain radiographs) as part of my medical decision making, as well as reviewing the written report by the radiologist.  ED MD interpretation: Subdural hematoma noted on CT scan  Official radiology report(s): CT Head Wo Contrast  Result Date: 04/16/2021 CLINICAL DATA:  Unwitnessed fall 1 week ago.  Increasing confusion EXAM: CT HEAD WITHOUT CONTRAST CT CERVICAL SPINE WITHOUT CONTRAST TECHNIQUE: Multidetector CT imaging of the head and cervical spine was performed following the standard protocol without intravenous contrast. Multiplanar CT image reconstructions of the cervical spine were also generated. COMPARISON:  None. FINDINGS: CT HEAD FINDINGS Brain: Subacute appearing subdural collection overlying the left cerebral convexity measuring up to 6-7 mm in thickness (series 3, image 13). 4 mm left to right midline shift. No evidence of intraparenchymal hemorrhage. No hydrocephalus. No mass lesion identified. Mild-moderate low-density changes within the periventricular and subcortical white matter compatible with chronic microvascular ischemic change. Mild diffuse cerebral volume loss. Vascular: Atherosclerotic calcifications involving the large vessels of the skull base. No unexpected hyperdense vessel. Skull: Normal. Negative for fracture or focal lesion. Sinuses/Orbits: Mild bilateral ethmoid mucosal thickening. Other: Negative for scalp hematoma. CT CERVICAL SPINE FINDINGS Alignment: Facet joints are aligned without dislocation or traumatic listhesis. Dens and lateral masses are aligned. Skull base and vertebrae: No acute fracture. No primary bone lesion or focal pathologic process. Soft tissues and spinal canal: No prevertebral fluid or swelling. No visible canal hematoma. Disc levels: Multilevel degenerative disc disease within the cervical  spine most pronounced from C4-5 through C7-T1. Mild multilevel facet arthropathy. No evidence of high-grade canal stenosis by CT. Upper chest: Included lung apices are clear. Other: None. IMPRESSION: 1. Subacute appearing subdural hematoma overlying the left cerebral convexity measuring up to 6-7 mm in thickness with associated 4 mm left to right midline shift. 2. Chronic microvascular ischemic changes and cerebral volume loss. 3. No evidence of acute fracture or traumatic listhesis of the cervical spine. 4. Multilevel degenerative disc disease and facet arthropathy of the cervical spine. Critical Value/emergent results were called by telephone at the time of interpretation on 04/16/2021 at 5:28 pm to provider [facility] , who verbally acknowledged these results. Electronically Signed   By: Davina Poke D.O.   On: 04/16/2021 17:30   CT Cervical Spine Wo Contrast  Result Date: 04/16/2021 CLINICAL DATA:  Unwitnessed fall 1 week ago.  Increasing confusion EXAM: CT HEAD WITHOUT CONTRAST CT CERVICAL SPINE WITHOUT CONTRAST TECHNIQUE: Multidetector CT imaging of the head and cervical spine was performed following the standard protocol without intravenous contrast. Multiplanar CT image reconstructions of the cervical spine were also generated. COMPARISON:  None. FINDINGS: CT HEAD FINDINGS Brain: Subacute appearing subdural collection overlying the left cerebral convexity measuring up to 6-7 mm in thickness (series 3, image 13). 4 mm left to right midline shift. No evidence of intraparenchymal hemorrhage. No hydrocephalus. No mass lesion identified. Mild-moderate low-density changes within the periventricular and subcortical white matter compatible with chronic microvascular ischemic change. Mild diffuse cerebral volume loss. Vascular: Atherosclerotic calcifications involving the large vessels of the skull base. No unexpected hyperdense vessel. Skull: Normal. Negative for fracture or focal lesion. Sinuses/Orbits: Mild  bilateral ethmoid mucosal thickening. Other: Negative for scalp hematoma. CT CERVICAL SPINE FINDINGS Alignment: Facet joints are aligned without dislocation or traumatic listhesis. Dens and lateral masses are aligned. Skull base and vertebrae: No acute fracture. No primary bone lesion or focal pathologic process. Soft tissues and spinal canal: No prevertebral fluid or swelling. No visible canal hematoma. Disc levels: Multilevel degenerative disc disease within the cervical spine most pronounced from C4-5 through C7-T1. Mild multilevel facet arthropathy. No evidence of high-grade canal stenosis by CT. Upper chest: Included lung apices are clear. Other: None. IMPRESSION: 1. Subacute appearing subdural hematoma overlying the left cerebral convexity measuring up to 6-7 mm in thickness  with associated 4 mm left to right midline shift. 2. Chronic microvascular ischemic changes and cerebral volume loss. 3. No evidence of acute fracture or traumatic listhesis of the cervical spine. 4. Multilevel degenerative disc disease and facet arthropathy of the cervical spine. Critical Value/emergent results were called by telephone at the time of interpretation on 04/16/2021 at 5:28 pm to provider [facility] , who verbally acknowledged these results. Electronically Signed   By: Davina Poke D.O.   On: 04/16/2021 17:30   US Venous Img Lower Bilateral  Result Date: 04/16/2021 CLINICAL DATA:  Swelling EXAM: BILATERAL LOWER EXTREMITY VENOUS DOPPLER ULTRASOUND TECHNIQUE: Gray-scale sonography with compression, as well as color and duplex ultrasound, were performed to evaluate the deep venous system(s) from the level of the common femoral vein through the popliteal and proximal calf veins. COMPARISON:  None. FINDINGS: VENOUS Normal compressibility of the common femoral, superficial femoral, and popliteal veins, as well as the visualized calf veins. Visualized portions of profunda femoral vein and great saphenous vein unremarkable. No  filling defects to suggest DVT on grayscale or color Doppler imaging. Doppler waveforms show normal direction of venous flow, normal respiratory plasticity and response to augmentation. Limited views of the contralateral common femoral vein are unremarkable. OTHER None. Limitations: none IMPRESSION: Negative. Electronically Signed   By: Dorise Bullion III M.D   On: 04/16/2021 17:58   CT T-SPINE NO CHARGE  Result Date: 04/16/2021 CLINICAL DATA:  Fall 1 week ago with back pain EXAM: CT THORACIC AND LUMBAR SPINE WITHOUT CONTRAST TECHNIQUE: Multidetector CT imaging of the thoracic and lumbar spine was performed without contrast. Multiplanar CT image reconstructions were also generated. COMPARISON:  None. FINDINGS: CT THORACIC SPINE FINDINGS Alignment: Mild dextroconvex curvature of the thoracic spine. No evidence of traumatic listhesis. Vertebrae: Anterior superior compression deformity of the T12 vertebral body with approximately 25% height loss. Paraspinal and other soft tissues: Negative. Disc levels: Multilevel degenerative change of the thoracic spine without significant neural foraminal or canal narrowing. CT LUMBAR SPINE FINDINGS Segmentation: 5 lumbar type vertebrae. Alignment: No evidence of traumatic listhesis. Vertebrae: Age-indeterminate anterior superior compression deformity at L1 with approximately 40% height loss. Paraspinal and other soft tissues: Negative. Disc levels: Multilevel degenerative changes spine most significant at L2-L3 and L3-L4 with at least moderate neural foraminal narrowing at L3-L4 on the right as well as L2-L3 on the left. No significant canal narrowing. IMPRESSION: CT THORACIC SPINE IMPRESSION Age-indeterminate anterior superior compression deformity at T12 vertebral body with approximately 25% height loss. Multilevel degenerative change of the spine without significant neural foraminal or canal narrowing. CT LUMBAR SPINE IMPRESSION Age-indeterminate anterior superior  compression deformity at L1 with approximately 40% height loss. Multilevel degenerative changes of the thoracic and lumbar spine, most significant at L2-L3 and L3-L4 with at least moderate neural foraminal narrowing at L3-L4 on the right as well as L2-L3 on the left. Electronically Signed   By: Dahlia Bailiff MD   On: 04/16/2021 17:41   CT L-SPINE NO CHARGE  Result Date: 04/16/2021 CLINICAL DATA:  Fall 1 week ago with back pain EXAM: CT THORACIC AND LUMBAR SPINE WITHOUT CONTRAST TECHNIQUE: Multidetector CT imaging of the thoracic and lumbar spine was performed without contrast. Multiplanar CT image reconstructions were also generated. COMPARISON:  None. FINDINGS: CT THORACIC SPINE FINDINGS Alignment: Mild dextroconvex curvature of the thoracic spine. No evidence of traumatic listhesis. Vertebrae: Anterior superior compression deformity of the T12 vertebral body with approximately 25% height loss. Paraspinal and other soft tissues: Negative. Disc levels:  Multilevel degenerative change of the thoracic spine without significant neural foraminal or canal narrowing. CT LUMBAR SPINE FINDINGS Segmentation: 5 lumbar type vertebrae. Alignment: No evidence of traumatic listhesis. Vertebrae: Age-indeterminate anterior superior compression deformity at L1 with approximately 40% height loss. Paraspinal and other soft tissues: Negative. Disc levels: Multilevel degenerative changes spine most significant at L2-L3 and L3-L4 with at least moderate neural foraminal narrowing at L3-L4 on the right as well as L2-L3 on the left. No significant canal narrowing. IMPRESSION: CT THORACIC SPINE IMPRESSION Age-indeterminate anterior superior compression deformity at T12 vertebral body with approximately 25% height loss. Multilevel degenerative change of the spine without significant neural foraminal or canal narrowing. CT LUMBAR SPINE IMPRESSION Age-indeterminate anterior superior compression deformity at L1 with approximately 40% height  loss. Multilevel degenerative changes of the thoracic and lumbar spine, most significant at L2-L3 and L3-L4 with at least moderate neural foraminal narrowing at L3-L4 on the right as well as L2-L3 on the left. Electronically Signed   By: Dahlia Bailiff MD   On: 04/16/2021 17:41   CT CHEST ABDOMEN PELVIS WO CONTRAST  Result Date: 04/16/2021 CLINICAL DATA:  Fall 1 week ago with back pain and right leg swelling. EXAM: CT CHEST, ABDOMEN AND PELVIS WITHOUT CONTRAST TECHNIQUE: Multidetector CT imaging of the chest, abdomen and pelvis was performed following the standard protocol without IV contrast. COMPARISON:  None. FINDINGS: CT CHEST FINDINGS Cardiovascular: Aortic atherosclerosis. No thoracic aortic aneurysm. Three vessel dense coronary artery calcifications. Calcifications of the mitral annulus and aortic valve. Normal size heart. Trace pericardial fluid, likely physiologic. Mediastinum/Nodes: No discrete thyroid nodularity. No pathologically enlarged mediastinal or axillary lymph nodes. The trachea and esophagus are grossly unremarkable. Small hiatal hernia. Lungs/Pleura: Right middle lobe and lingular atelectasis. No evidence of pulmonary contusion or hemorrhage. No focal consolidation. No pleural effusion. No pneumothorax. Musculoskeletal: Nondisplaced left anterior fourth and fifth rib fractures. CT ABDOMEN PELVIS FINDINGS Hepatobiliary: Unremarkable noncontrast appearance of the hepatic parenchyma. Gallbladder is unremarkable. No biliary ductal dilation. Pancreas: Fatty pancreatic atrophy. Spleen: No splenic injury or perisplenic hematoma. Adrenals/Urinary Tract: Left adrenal thickening. Right adrenal glands unremarkable. Native bilateral kidneys are atrophic with calcifications without hydronephrosis. Right lower quadrant transplant kidney. No transplant kidney hydronephrosis. 3 mm nonobstructive stone in the upper pole of the transplant kidney. Urinary bladder is grossly unremarkable for degree of  distension. Stomach/Bowel: Small hiatal hernia otherwise the stomach is grossly unremarkable. No suspicious small bowel dilation. No no evidence of acute of colonic inflammation or obstruction. Vascular/Lymphatic: Aortic atherosclerosis. No abdominal aortic aneurysm. Vascular stents versus dense calcifications in the bilateral common iliac arteries. No enlarged abdominal or pelvic lymph nodes. Reproductive: Uterus and bilateral adnexa are unremarkable. Other: No abdominopelvic ascites. Postsurgical change in the right anterior abdominopelvic wall. Musculoskeletal: Age indeterminate anterior superior endplate compression deformities at T12 and L1 with approximately 25% height loss at T12 and 40% height loss at L1. No evidence of traumatic listhesis. Multilevel degenerative change of the spine most significant at L2-L3 and L3-L4. IMPRESSION: 1. Nondisplaced left anterior fourth and fifth rib fractures. No pneumothorax. 2. Age indeterminate anterior superior endplate compression deformities at T12 and L1 with approximately 25% height loss at T12 and 40% height loss at L1. 3. Right lower quadrant transplant kidney without hydronephrosis. 3 mm nonobstructive stone in the upper pole of the transplant kidney. 4. Small hiatal hernia. 5. Aortic atherosclerosis. Aortic Atherosclerosis (ICD10-I70.0). Electronically Signed   By: Dahlia Bailiff MD   On: 04/16/2021 17:42    ____________________________________________  PROCEDURES  Procedure(s) performed (including Critical Care):  .1-3 Lead EKG Interpretation Performed by: Vanessa [place], MD Authorized by: Vanessa [place], MD     Interpretation: abnormal     ECG rate:  110s    ECG rate assessment: tachycardic     Rhythm: atrial fibrillation     Ectopy: none     Conduction: normal    .Critical Care Performed by: Vanessa [place], MD Authorized by: Vanessa [place], MD   Critical care provider statement:    Critical care time (minutes):  45   Critical care was  necessary to treat or prevent imminent or life-threatening deterioration of the following conditions:  CNS failure or compromise   Critical care was time spent personally by me on the following activities:  Discussions with consultants, evaluation of patient's response to treatment, examination of patient, ordering and performing treatments and interventions, ordering and review of laboratory studies, ordering and review of radiographic studies, pulse oximetry, re-evaluation of patient's condition, obtaining history from patient or surrogate and review of old charts     ____________________________________________   INITIAL IMPRESSION / ASSESSMENT AND PLAN / ED COURSE  Lakita Sahlin O'Hair was evaluated in Emergency Department on 04/16/2021 for the symptoms described in the history of present illness. She was evaluated in the context of the global COVID-19 pandemic, which necessitated consideration that the patient might be at risk for infection with the SARS-CoV-2 virus that causes COVID-19. Institutional protocols and algorithms that pertain to the evaluation of patients at risk for COVID-19 are in a state of rapid change based on information released by regulatory bodies including the CDC and federal and state organizations. These policies and algorithms were followed during the patient's care in the ED.    Patient is a 75 year old who comes in with confusion and being found down on the ground.  According to family the patient was last seen yesterday and was confused at that time.  Last time she was normal was a week ago.  Patient states that she was found down on the ground this morning but she is not sure how she got there but reports pain all over her body including her chest, abdominal and her entire back.  Will get labs to evaluate for electrolyte abnormalities, AKI.  Will check kidney function before initiating CT scans chest abdomen pelvis to evaluate for acute pathology given patient's confusion is  hard to get a great exam and patient is reporting diffuse pain throughout.  We will get urine to evaluate for UTI.  No focal findings to suggest stroke but patient may need MRI if work-up is otherwise negative.  I suspect patient will require admission for new altered mental status.  CT scan shows subdural hematoma with some shift.  5:57 PM discussed with Dr. Lacinda Axon from neurosurgery.  Recommends admission for observation to make sure this was not a seizure, 1 unit of platelets, repeat CT scan in the morning.\  I did let vascular Dr. Lang Snow know who was okay with stopping the plavix.   Patient also noted to have some compression fractures but it is difficult to tell if these are new or old.  Dr. Lacinda Axon would review the images but at this time I am not that she would really tolerate a brace.  Also some rib fractures but patient is under percent on room air.  Will discuss hospital team for admission.       ____________________________________________   FINAL CLINICAL IMPRESSION(S) / ED DIAGNOSES  Final diagnoses:  Back pain  Subdural hematoma (HCC)  Closed fracture of multiple ribs, unspecified laterality, initial encounter  Compression fracture of thoracic spine, non-traumatic, initial encounter ([place])      MEDICATIONS GIVEN DURING THIS VISIT:  Medications  sodium chloride 0.9 % bolus 500 mL (has no administration in time range)  iohexol (OMNIPAQUE) 350 MG/ML injection 75 mL (has no administration in time range)  0.9 %  sodium chloride infusion (has no administration in time range)  acetaminophen (TYLENOL) tablet 1,000 mg (1,000 mg Oral Given 04/16/21 1541)     ED Discharge Orders    None       Note:  This document was prepared using Dragon voice recognition software and may include unintentional dictation errors.   Vanessa Perkins, MD 04/16/21 Tresa Moore

## 2021-04-17 ENCOUNTER — Inpatient Hospital Stay: Payer: Medicare Other

## 2021-04-17 DIAGNOSIS — S065X9A Traumatic subdural hemorrhage with loss of consciousness of unspecified duration, initial encounter: Secondary | ICD-10-CM | POA: Diagnosis not present

## 2021-04-17 LAB — CBC
HCT: 35.9 % — ABNORMAL LOW (ref 36.0–46.0)
Hemoglobin: 12.3 g/dL (ref 12.0–15.0)
MCH: 30.1 pg (ref 26.0–34.0)
MCHC: 34.3 g/dL (ref 30.0–36.0)
MCV: 87.8 fL (ref 80.0–100.0)
Platelets: 423 10*3/uL — ABNORMAL HIGH (ref 150–400)
RBC: 4.09 MIL/uL (ref 3.87–5.11)
RDW: 15.8 % — ABNORMAL HIGH (ref 11.5–15.5)
WBC: 12.2 10*3/uL — ABNORMAL HIGH (ref 4.0–10.5)
nRBC: 0 % (ref 0.0–0.2)

## 2021-04-17 LAB — BASIC METABOLIC PANEL
Anion gap: 9 (ref 5–15)
BUN: 20 mg/dL (ref 8–23)
CO2: 20 mmol/L — ABNORMAL LOW (ref 22–32)
Calcium: 8.5 mg/dL — ABNORMAL LOW (ref 8.9–10.3)
Chloride: 104 mmol/L (ref 98–111)
Creatinine, Ser: 0.63 mg/dL (ref 0.44–1.00)
GFR, Estimated: >60 mL/min (ref >60)
Glucose, Bld: 76 mg/dL (ref 70–99)
Potassium: 4.6 mmol/L (ref 3.5–5.1)
Sodium: 133 mmol/L — ABNORMAL LOW (ref 135–145)

## 2021-04-17 LAB — GLUCOSE, CAPILLARY
Glucose-Capillary: 102 mg/dL — ABNORMAL HIGH (ref 70–99)
Glucose-Capillary: 123 mg/dL — ABNORMAL HIGH (ref 70–99)
Glucose-Capillary: 136 mg/dL — ABNORMAL HIGH (ref 70–99)
Glucose-Capillary: 61 mg/dL — ABNORMAL LOW (ref 70–99)
Glucose-Capillary: 86 mg/dL (ref 70–99)
Glucose-Capillary: 88 mg/dL (ref 70–99)
Glucose-Capillary: 98 mg/dL (ref 70–99)

## 2021-04-17 LAB — BPAM PLATELET PHERESIS
Blood Product Expiration Date: 202204252359
Unit Type and Rh: 5100

## 2021-04-17 LAB — PREPARE PLATELET PHERESIS: Unit division: 0

## 2021-04-17 LAB — HEMOGLOBIN A1C
Hgb A1c MFr Bld: 5.1 % (ref 4.8–5.6)
Mean Plasma Glucose: 99.67 mg/dL

## 2021-04-17 LAB — MRSA PCR SCREENING: MRSA by PCR: NEGATIVE

## 2021-04-17 MED ORDER — CHLORHEXIDINE GLUCONATE CLOTH 2 % EX PADS
6.0000 | MEDICATED_PAD | Freq: Every day | CUTANEOUS | Status: DC
  Administered 2021-04-19: 6 via TOPICAL

## 2021-04-17 NOTE — Plan of Care (Signed)
A&O patient fell at home one week ago, admitted with subdural hematoma to ICU 6.

## 2021-04-17 NOTE — Consult Note (Signed)
[facility] Nurse Consult Note: Reason for Consult:Patient with full thickness wounds on right great toe, nonviable patches of eschar. Last angioplasty in March.  ED MD discussed with Vascular Surgeon, Dr. Lang Snow who recommended holding off on angioplasty at this time due to subdural hematoma/bleed.  Wound type:Vascular insufficiency, known PAD. Patient is s/p bilateral LE and iliac stents Pressure Injury POA: N/A Measurement:See photos taken by ED provider in Media Tab Wound bed: Yellow, nonviable tissue at distal tip and with full thickness nonviable tissue more distally on the great toe. See photos. Drainage (amount, consistency, odor) small to moderate light yellow (consistent with autolytically debriding nonviable tissue) Periwound:erythema, edema Dressing procedure/placement/frequency: I have provided Nursing with twice daily care orders that are conservative and aimed at preventing infection as the only curative intervention is revascularization. II will ask Nursing to cleanse the affected digit twice daily and cover with a nonadherent antimicrobial, xeroform topped with dry gauze and secured.  Bilateral pressure redistribution heel boots are ordered and a sacral foam is to be placed for pressure injury prophylaxis.   If more aggressive wound care is desired, please consider consult with Vascular surgery.   [facility] nursing team will not follow, but will remain available to this patient, the nursing and medical teams.  Please re-consult if needed. Thanks, Maudie Flakes, MSN, RN, [place], Ar[facility]  Pager# [phone]

## 2021-04-17 NOTE — Progress Notes (Signed)
Home supply of everolimus brought to hospital by patient's sister, 120 tablets total. Pharmacy made aware, instructed to place in home med bag in patient's room. Placed in sealed home med bag with patient's name and placed in ICU 06 in-room cabinet per patient preference.

## 2021-04-17 NOTE — Progress Notes (Signed)
PROGRESS NOTE    Cindy Fischer  LPF:[id number] DOB: 01-23-1946 DOA: 04/16/2021 PCP: Albina Billet, MD  Brief Narrative: 75 y.o. 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  Patient not a great historian since she appears to be confused about the timeline of her falls.  Reports that she woke up on the floor this morning with severe back pain.  Last thing she remembered was reading a book at bedtime the night before.  Thinks she might have had urinary incontinence but later states that it was with a different fall.  Reportedly she had 2 falls in the last couple weeks but cannot remember the exact mechanism of the fall although thinks her legs gave out.  States she might have hit her head during one of them.  Patient lives alone and Cindy at bedside last saw her on Sunday and thought that she was less active than usual and was staying in bed.  Then yesterday a different Cindy went to visit her and thought she appeared confused.  Today she had a friend mow her lawn and he was concerned she did not answer her door.  When they found her she had already returned back to bed from her fall but was in severe pain.  Repeat head CT showed stability of subdural bleed.  Small amount of midline shift.  Patient's mental status appears to be at baseline.   Assessment & Plan:   Principal Problem:   Subdural hematoma (HCC) Active Problems:   Atherosclerosis of native arteries of extremity with intermittent claudication (HCC)   Renal transplant, status post   Diabetes (HCC)   GERD (gastroesophageal reflux disease)   Hypothyroidism   Thrombocytosis   Lymphedema   Hypertensive disorder   Compression fracture of thoracolumbar vertebra  (HCC)   Multiple rib fractures   Nephrolithiasis  Subdural hematoma bleed -s/p multiple falls likely due to LE claudication and on Plavix  -6-69mm thickness with 2mm left to right midline shift -pt alert and oriented to self, place and time but confused about family at bedside and having memory issues with current event - Repeat head CT: stable bleed Plan: Continue frequent neurochecks Seizure precautions Follow any further neurosurgery recommendations, currently pending Therapy evaluations when able  T12 and L1 compression fracture - age indetermine. Neurosurgery Dr. Marsa Aris to look at imaging and give recommendation of whether she will benefit from TLSO brace - PRN pain control  Nondisplaced left anterior fourth and fifth rib fracture - PRN pain control  Thrombocytosis - Plt >700. Chronic and was uptrending since 2020 - Neurosurgery initially recommend platelet transfusion but decided to hold off due to elevation and recommended obtain platelet assay  Rt upper pole Nephrolithiasis w/hx of kidney transplant for previous ESRD on HD - continue prednisone, Tacrolimus and Everolimus  - sees Dr. Camillia Herter at [facility]-  - Patient is making urine with normal creatinine.  Defer nephrology consult for now  PAD s/p bilateral LE and iliac stents  -last angioplasty in March -appears to be on Plavix and aspirin  -ED physician spoke with vascular surgery Dr. Lang Snow and he recommended holding  given subdural hematoma bleed - Will need further discussion regarding timing of restart  LE lymphedema Bilateral venous Doppler ultrasound negative  Diabetes On 30  units NPH at breakfast at home with sliding scale  start with 20 U Lantus in the morning with moderate SSI  Hypertension continue Lisinopril  GERD Continue PPI  Hypothyroidism Continue levothyroxine   DVT prophylaxis: SCD Code Status: FULL Family Communication: Attempted to call Cindy Fischer [phone] on  4/24.  No answer, could not leave voicemail Disposition Plan:Status is: Inpatient  Remains inpatient appropriate because:Inpatient level of care appropriate due to severity of illness   Dispo: The patient is from: Home              Anticipated d/c is to: Home              Patient currently is not medically stable to d/c.   Difficult to place patient No  Stable subdural hematoma.  No plans for surgical intervention.  We will request therapy evaluations and plan on discharge in 24 hours if patient remains stable.     Level of care: Stepdown  Consultants:   Neurosurgery   Procedures:None  Antimicrobials:   None   Subjective: Patient seen and examined.  Reports no apparent distress.  Alert and oriented x3  Objective: Vitals:   04/17/21 0700 04/17/21 0800 04/17/21 0900 04/17/21 1000  BP: 134/72 (!) 144/76 125/66 130/81  Pulse: 93 89 81 74  Resp: 18 (!) 21 19 (!) 25  Temp:      TempSrc:      SpO2: 97% 94% 94% 100%  Weight:      Height:        Intake/Output Summary (Last 24 hours) at 04/17/2021 1216 Last data filed at 04/17/2021 0300 Gross per 24 hour  Intake --  Output 200 ml  Net -200 ml   Filed Weights   04/16/21 1452 04/17/21 0030  Weight: 71.7 kg 70.3 kg    Examination:  General exam: Appears calm and comfortable  Respiratory system: Clear to auscultation. Respiratory effort normal. Cardiovascular system: Tachycardic, regular rhythm, no murmurs Gastrointestinal system: Abdomen is nondistended, soft and nontender. No organomegaly or masses felt. Normal bowel sounds heard. Central nervous system: Alert and oriented. No focal neurological deficits. Extremities: Symmetric 5 x 5 power. Skin: No rashes, lesions or ulcers Psychiatry: Judgement and insight appear normal. Mood & affect appropriate.     Data Reviewed: I have personally reviewed following labs and imaging studies  CBC: Recent Labs  Lab 04/16/21 1538 04/17/21 0447  WBC 13.3* 12.2*   NEUTROABS 11.6*  --   HGB 12.8 12.3  HCT 38.4 35.9*  MCV 89.1 87.8  PLT 734* 202*   Basic Metabolic Panel: Recent Labs  Lab 04/16/21 1538 04/17/21 0447  NA 135 133*  K 4.7 4.6  CL 102 104  CO2 22 20*  GLUCOSE 128* 76  BUN 25* 20  CREATININE 0.75 0.63  CALCIUM 9.3 8.5*  MG 1.8  --    GFR: Estimated Creatinine Clearance: 55.8 mL/min (by C-G formula based on SCr of 0.63 mg/dL). Liver Function Tests: Recent Labs  Lab 04/16/21 1538  AST 30  ALT 14  ALKPHOS 67  BILITOT 0.9  PROT 6.2*  ALBUMIN 3.5   No results for input(s): LIPASE, AMYLASE in the last 168 hours. No results for input(s): AMMONIA in the last 168 hours. Coagulation Profile: No results for input(s): INR, PROTIME in the last 168 hours. Cardiac Enzymes: Recent Labs  Lab 04/16/21 1538  CKTOTAL 144   BNP (last 3 results) No results for input(s): PROBNP in the last 8760 hours. HbA1C: No results for input(s):  HGBA1C in the last 72 hours. CBG: Recent Labs  Lab 04/17/21 0019 04/17/21 0307 04/17/21 0816 04/17/21 1208  GLUCAP 86 88 102* 123*   Lipid Profile: No results for input(s): CHOL, HDL, LDLCALC, TRIG, CHOLHDL, LDLDIRECT in the last 72 hours. Thyroid Function Tests: Recent Labs    04/16/21 1538  TSH 2.040  FREET4 1.29*   Anemia Panel: No results for input(s): VITAMINB12, FOLATE, FERRITIN, TIBC, IRON, RETICCTPCT in the last 72 hours. Sepsis Labs: No results for input(s): PROCALCITON, LATICACIDVEN in the last 168 hours.  Recent Results (from the past 240 hour(s))  Resp Panel by RT-PCR (Flu A&B, Covid) Nasopharyngeal Swab     Status: None   Collection Time: 04/16/21  3:41 PM   Specimen: Nasopharyngeal Swab; Nasopharyngeal(NP) swabs in vial transport medium  Result Value Ref Range Status   SARS Coronavirus 2 by RT PCR NEGATIVE NEGATIVE Final    Comment: (NOTE) SARS-CoV-2 target nucleic acids are NOT DETECTED.  The SARS-CoV-2 RNA is generally detectable in upper respiratory specimens  during the acute phase of infection. The lowest concentration of SARS-CoV-2 viral copies this assay can detect is 138 copies/mL. A negative result does not preclude SARS-Cov-2 infection and should not be used as the sole basis for treatment or other patient management decisions. A negative result may occur with  improper specimen collection/handling, submission of specimen other than nasopharyngeal swab, presence of viral mutation(s) within the areas targeted by this assay, and inadequate number of viral copies(<138 copies/mL). A negative result must be combined with clinical observations, patient history, and epidemiological information. The expected result is Negative.  Fact Sheet for Patients:  EntrepreneurPulse.com.au  Fact Sheet for Healthcare Providers:  IncredibleEmployment.be  This test is no t yet approved or cleared by the [place] FDA and  has been authorized for detection and/or diagnosis of SARS-CoV-2 by FDA under an Emergency Use Authorization (EUA). This EUA will remain  in effect (meaning this test can be used) for the duration of the COVID-19 declaration under Section 564(b)(1) of the Act, 21 U.S.C.section 360bbb-3(b)(1), unless the authorization is terminated  or revoked sooner.       Influenza A by PCR NEGATIVE NEGATIVE Final   Influenza B by PCR NEGATIVE NEGATIVE Final    Comment: (NOTE) The Xpert Xpress SARS-CoV-2/FLU/RSV plus assay is intended as an aid in the diagnosis of influenza from Nasopharyngeal swab specimens and should not be used as a sole basis for treatment. Nasal washings and aspirates are unacceptable for Xpert Xpress SARS-CoV-2/FLU/RSV testing.  Fact Sheet for Patients: EntrepreneurPulse.com.au  Fact Sheet for Healthcare Providers: IncredibleEmployment.be  This test is not yet approved or cleared by the [place] FDA and has been authorized for detection  and/or diagnosis of SARS-CoV-2 by FDA under an Emergency Use Authorization (EUA). This EUA will remain in effect (meaning this test can be used) for the duration of the COVID-19 declaration under Section 564(b)(1) of the Act, 21 U.S.C. section 360bbb-3(b)(1), unless the authorization is terminated or revoked.  Performed at [facility], [address] [place], [place] [address]   MRSA PCR Screening     Status: None   Collection Time: 04/17/21 12:59 AM   Specimen: Nasal Mucosa; Nasopharyngeal  Result Value Ref Range Status   MRSA by PCR NEGATIVE NEGATIVE Final    Comment:        The GeneXpert MRSA Assay (FDA approved for NASAL specimens only), is one component of a comprehensive MRSA colonization surveillance program. It is not intended to diagnose  MRSA infection nor to guide or monitor treatment for MRSA infections. Performed at [facility], [address] [place], [place] [address]          Radiology Studies: CT HEAD WO CONTRAST  Result Date: 04/17/2021 CLINICAL DATA:  Follow-up subdural hematoma EXAM: CT HEAD WITHOUT CONTRAST TECHNIQUE: Contiguous axial images were obtained from the base of the skull through the vertex without intravenous contrast. COMPARISON:  Yesterday FINDINGS: Brain: The subdural hematoma along the left cerebral convexity with primarily low-density is non progressed. Maximal thickness is 6 mm and midline shift measures 3 mm. No cortical infarct, hydrocephalus, or masslike finding. Vascular: No hyperdense vessel or unexpected calcification. Skull: Normal. Negative for fracture or focal lesion. Sinuses/Orbits: Retention cyst in the inferior right maxillary sinus. Bilateral cataract resection. IMPRESSION: Unchanged nonacute subdural hematoma around the left cerebral convexity, 6 mm in maximal thickness. Electronically Signed   By: Monte Fantasia M.D.   On: 04/17/2021 05:19   CT Head Wo Contrast  Result Date: 04/16/2021 CLINICAL DATA:   Unwitnessed fall 1 week ago.  Increasing confusion EXAM: CT HEAD WITHOUT CONTRAST CT CERVICAL SPINE WITHOUT CONTRAST TECHNIQUE: Multidetector CT imaging of the head and cervical spine was performed following the standard protocol without intravenous contrast. Multiplanar CT image reconstructions of the cervical spine were also generated. COMPARISON:  None. FINDINGS: CT HEAD FINDINGS Brain: Subacute appearing subdural collection overlying the left cerebral convexity measuring up to 6-7 mm in thickness (series 3, image 13). 4 mm left to right midline shift. No evidence of intraparenchymal hemorrhage. No hydrocephalus. No mass lesion identified. Mild-moderate low-density changes within the periventricular and subcortical white matter compatible with chronic microvascular ischemic change. Mild diffuse cerebral volume loss. Vascular: Atherosclerotic calcifications involving the large vessels of the skull base. No unexpected hyperdense vessel. Skull: Normal. Negative for fracture or focal lesion. Sinuses/Orbits: Mild bilateral ethmoid mucosal thickening. Other: Negative for scalp hematoma. CT CERVICAL SPINE FINDINGS Alignment: Facet joints are aligned without dislocation or traumatic listhesis. Dens and lateral masses are aligned. Skull base and vertebrae: No acute fracture. No primary bone lesion or focal pathologic process. Soft tissues and spinal canal: No prevertebral fluid or swelling. No visible canal hematoma. Disc levels: Multilevel degenerative disc disease within the cervical spine most pronounced from C4-5 through C7-T1. Mild multilevel facet arthropathy. No evidence of high-grade canal stenosis by CT. Upper chest: Included lung apices are clear. Other: None. IMPRESSION: 1. Subacute appearing subdural hematoma overlying the left cerebral convexity measuring up to 6-7 mm in thickness with associated 4 mm left to right midline shift. 2. Chronic microvascular ischemic changes and cerebral volume loss. 3. No  evidence of acute fracture or traumatic listhesis of the cervical spine. 4. Multilevel degenerative disc disease and facet arthropathy of the cervical spine. Critical Value/emergent results were called by telephone at the time of interpretation on 04/16/2021 at 5:28 pm to provider [facility] , who verbally acknowledged these results. Electronically Signed   By: Davina Poke D.O.   On: 04/16/2021 17:30   CT Cervical Spine Wo Contrast  Result Date: 04/16/2021 CLINICAL DATA:  Unwitnessed fall 1 week ago.  Increasing confusion EXAM: CT HEAD WITHOUT CONTRAST CT CERVICAL SPINE WITHOUT CONTRAST TECHNIQUE: Multidetector CT imaging of the head and cervical spine was performed following the standard protocol without intravenous contrast. Multiplanar CT image reconstructions of the cervical spine were also generated. COMPARISON:  None. FINDINGS: CT HEAD FINDINGS Brain: Subacute appearing subdural collection overlying the left cerebral convexity measuring up to 6-7 mm  in thickness (series 3, image 13). 4 mm left to right midline shift. No evidence of intraparenchymal hemorrhage. No hydrocephalus. No mass lesion identified. Mild-moderate low-density changes within the periventricular and subcortical white matter compatible with chronic microvascular ischemic change. Mild diffuse cerebral volume loss. Vascular: Atherosclerotic calcifications involving the large vessels of the skull base. No unexpected hyperdense vessel. Skull: Normal. Negative for fracture or focal lesion. Sinuses/Orbits: Mild bilateral ethmoid mucosal thickening. Other: Negative for scalp hematoma. CT CERVICAL SPINE FINDINGS Alignment: Facet joints are aligned without dislocation or traumatic listhesis. Dens and lateral masses are aligned. Skull base and vertebrae: No acute fracture. No primary bone lesion or focal pathologic process. Soft tissues and spinal canal: No prevertebral fluid or swelling. No visible canal hematoma. Disc levels: Multilevel  degenerative disc disease within the cervical spine most pronounced from C4-5 through C7-T1. Mild multilevel facet arthropathy. No evidence of high-grade canal stenosis by CT. Upper chest: Included lung apices are clear. Other: None. IMPRESSION: 1. Subacute appearing subdural hematoma overlying the left cerebral convexity measuring up to 6-7 mm in thickness with associated 4 mm left to right midline shift. 2. Chronic microvascular ischemic changes and cerebral volume loss. 3. No evidence of acute fracture or traumatic listhesis of the cervical spine. 4. Multilevel degenerative disc disease and facet arthropathy of the cervical spine. Critical Value/emergent results were called by telephone at the time of interpretation on 04/16/2021 at 5:28 pm to provider [facility] , who verbally acknowledged these results. Electronically Signed   By: Davina Poke D.O.   On: 04/16/2021 17:30   US Venous Img Lower Bilateral  Result Date: 04/16/2021 CLINICAL DATA:  Swelling EXAM: BILATERAL LOWER EXTREMITY VENOUS DOPPLER ULTRASOUND TECHNIQUE: Gray-scale sonography with compression, as well as color and duplex ultrasound, were performed to evaluate the deep venous system(s) from the level of the common femoral vein through the popliteal and proximal calf veins. COMPARISON:  None. FINDINGS: VENOUS Normal compressibility of the common femoral, superficial femoral, and popliteal veins, as well as the visualized calf veins. Visualized portions of profunda femoral vein and great saphenous vein unremarkable. No filling defects to suggest DVT on grayscale or color Doppler imaging. Doppler waveforms show normal direction of venous flow, normal respiratory plasticity and response to augmentation. Limited views of the contralateral common femoral vein are unremarkable. OTHER None. Limitations: none IMPRESSION: Negative. Electronically Signed   By: Dorise Bullion III M.D   On: 04/16/2021 17:58   CT T-SPINE NO CHARGE  Result Date:  04/16/2021 CLINICAL DATA:  Fall 1 week ago with back pain EXAM: CT THORACIC AND LUMBAR SPINE WITHOUT CONTRAST TECHNIQUE: Multidetector CT imaging of the thoracic and lumbar spine was performed without contrast. Multiplanar CT image reconstructions were also generated. COMPARISON:  None. FINDINGS: CT THORACIC SPINE FINDINGS Alignment: Mild dextroconvex curvature of the thoracic spine. No evidence of traumatic listhesis. Vertebrae: Anterior superior compression deformity of the T12 vertebral body with approximately 25% height loss. Paraspinal and other soft tissues: Negative. Disc levels: Multilevel degenerative change of the thoracic spine without significant neural foraminal or canal narrowing. CT LUMBAR SPINE FINDINGS Segmentation: 5 lumbar type vertebrae. Alignment: No evidence of traumatic listhesis. Vertebrae: Age-indeterminate anterior superior compression deformity at L1 with approximately 40% height loss. Paraspinal and other soft tissues: Negative. Disc levels: Multilevel degenerative changes spine most significant at L2-L3 and L3-L4 with at least moderate neural foraminal narrowing at L3-L4 on the right as well as L2-L3 on the left. No significant canal narrowing. IMPRESSION: CT THORACIC SPINE IMPRESSION Age-indeterminate  anterior superior compression deformity at T12 vertebral body with approximately 25% height loss. Multilevel degenerative change of the spine without significant neural foraminal or canal narrowing. CT LUMBAR SPINE IMPRESSION Age-indeterminate anterior superior compression deformity at L1 with approximately 40% height loss. Multilevel degenerative changes of the thoracic and lumbar spine, most significant at L2-L3 and L3-L4 with at least moderate neural foraminal narrowing at L3-L4 on the right as well as L2-L3 on the left. Electronically Signed   By: Dahlia Bailiff MD   On: 04/16/2021 17:41   CT L-SPINE NO CHARGE  Result Date: 04/16/2021 CLINICAL DATA:  Fall 1 week ago with back pain  EXAM: CT THORACIC AND LUMBAR SPINE WITHOUT CONTRAST TECHNIQUE: Multidetector CT imaging of the thoracic and lumbar spine was performed without contrast. Multiplanar CT image reconstructions were also generated. COMPARISON:  None. FINDINGS: CT THORACIC SPINE FINDINGS Alignment: Mild dextroconvex curvature of the thoracic spine. No evidence of traumatic listhesis. Vertebrae: Anterior superior compression deformity of the T12 vertebral body with approximately 25% height loss. Paraspinal and other soft tissues: Negative. Disc levels: Multilevel degenerative change of the thoracic spine without significant neural foraminal or canal narrowing. CT LUMBAR SPINE FINDINGS Segmentation: 5 lumbar type vertebrae. Alignment: No evidence of traumatic listhesis. Vertebrae: Age-indeterminate anterior superior compression deformity at L1 with approximately 40% height loss. Paraspinal and other soft tissues: Negative. Disc levels: Multilevel degenerative changes spine most significant at L2-L3 and L3-L4 with at least moderate neural foraminal narrowing at L3-L4 on the right as well as L2-L3 on the left. No significant canal narrowing. IMPRESSION: CT THORACIC SPINE IMPRESSION Age-indeterminate anterior superior compression deformity at T12 vertebral body with approximately 25% height loss. Multilevel degenerative change of the spine without significant neural foraminal or canal narrowing. CT LUMBAR SPINE IMPRESSION Age-indeterminate anterior superior compression deformity at L1 with approximately 40% height loss. Multilevel degenerative changes of the thoracic and lumbar spine, most significant at L2-L3 and L3-L4 with at least moderate neural foraminal narrowing at L3-L4 on the right as well as L2-L3 on the left. Electronically Signed   By: Dahlia Bailiff MD   On: 04/16/2021 17:41   CT CHEST ABDOMEN PELVIS WO CONTRAST  Result Date: 04/16/2021 CLINICAL DATA:  Fall 1 week ago with back pain and right leg swelling. EXAM: CT CHEST,  ABDOMEN AND PELVIS WITHOUT CONTRAST TECHNIQUE: Multidetector CT imaging of the chest, abdomen and pelvis was performed following the standard protocol without IV contrast. COMPARISON:  None. FINDINGS: CT CHEST FINDINGS Cardiovascular: Aortic atherosclerosis. No thoracic aortic aneurysm. Three vessel dense coronary artery calcifications. Calcifications of the mitral annulus and aortic valve. Normal size heart. Trace pericardial fluid, likely physiologic. Mediastinum/Nodes: No discrete thyroid nodularity. No pathologically enlarged mediastinal or axillary lymph nodes. The trachea and esophagus are grossly unremarkable. Small hiatal hernia. Lungs/Pleura: Right middle lobe and lingular atelectasis. No evidence of pulmonary contusion or hemorrhage. No focal consolidation. No pleural effusion. No pneumothorax. Musculoskeletal: Nondisplaced left anterior fourth and fifth rib fractures. CT ABDOMEN PELVIS FINDINGS Hepatobiliary: Unremarkable noncontrast appearance of the hepatic parenchyma. Gallbladder is unremarkable. No biliary ductal dilation. Pancreas: Fatty pancreatic atrophy. Spleen: No splenic injury or perisplenic hematoma. Adrenals/Urinary Tract: Left adrenal thickening. Right adrenal glands unremarkable. Native bilateral kidneys are atrophic with calcifications without hydronephrosis. Right lower quadrant transplant kidney. No transplant kidney hydronephrosis. 3 mm nonobstructive stone in the upper pole of the transplant kidney. Urinary bladder is grossly unremarkable for degree of distension. Stomach/Bowel: Small hiatal hernia otherwise the stomach is grossly unremarkable. No suspicious small bowel dilation.  No no evidence of acute of colonic inflammation or obstruction. Vascular/Lymphatic: Aortic atherosclerosis. No abdominal aortic aneurysm. Vascular stents versus dense calcifications in the bilateral common iliac arteries. No enlarged abdominal or pelvic lymph nodes. Reproductive: Uterus and bilateral adnexa  are unremarkable. Other: No abdominopelvic ascites. Postsurgical change in the right anterior abdominopelvic wall. Musculoskeletal: Age indeterminate anterior superior endplate compression deformities at T12 and L1 with approximately 25% height loss at T12 and 40% height loss at L1. No evidence of traumatic listhesis. Multilevel degenerative change of the spine most significant at L2-L3 and L3-L4. IMPRESSION: 1. Nondisplaced left anterior fourth and fifth rib fractures. No pneumothorax. 2. Age indeterminate anterior superior endplate compression deformities at T12 and L1 with approximately 25% height loss at T12 and 40% height loss at L1. 3. Right lower quadrant transplant kidney without hydronephrosis. 3 mm nonobstructive stone in the upper pole of the transplant kidney. 4. Small hiatal hernia. 5. Aortic atherosclerosis. Aortic Atherosclerosis (ICD10-I70.0). Electronically Signed   By: Dahlia Bailiff MD   On: 04/16/2021 17:42        Scheduled Meds: . atorvastatin  20 mg Oral Daily  . Chlorhexidine Gluconate Cloth  6 each Topical Daily  . Everolimus  1 mg Oral BID  . insulin aspart  0-15 Units Subcutaneous TID WC  . insulin aspart  0-5 Units Subcutaneous QHS  . insulin glargine  15 Units Subcutaneous Daily  . levothyroxine  100 mcg Oral Once per day on Sun Mon Tue Wed Thu Fri  . lisinopril  10 mg Oral Daily  . pantoprazole  40 mg Oral Daily  . predniSONE  5 mg Oral Daily  . tacrolimus  1 mg Oral QHS   And  . tacrolimus  1.5 mg Oral Daily   Continuous Infusions:   LOS: 1 day    Time spent: 25 minutes    Sidney Ace, MD [facility] Pager [phone]  If 7PM-7AM, please contact night-coverage 04/17/2021, 12:16 PM

## 2021-04-17 NOTE — Progress Notes (Signed)
Patient arrived from ED to ICU 06, admitted with subdural hematoma. Patient  is A&O X's 4, pupils equal and brisk. Patient has kept her purse at the bedside and states that she will send it home with her sister in the morning. Patient purse and clothes placed in belonging bag and placed in cabinet at the bedside.

## 2021-04-17 NOTE — Progress Notes (Signed)
  Subjective:  Patient ID: Cindy Fischer, female    DOB: 1946-07-26,  MRN: [id number]  Chief Complaint  Patient presents with  . Wound Check    "its slowly progressing"    75 y.o. female returns with the above complaint. History confirmed with patient.  Doing well thinks is improving  Objective:  Physical Exam: blanching with elevation noted, dependent rubor present, DP absent right, ulcerations present at right hallux, fourth and fifth toes and lateral right heel, normal sensory exam and PT absent right.  Foot is much warmer than previous, improving significantly, erythema is gone and ulcer seem to be healing  Fiber granular ulcerations measuring as follows: Left dorsal hallux 1.5 cm x 1.5 cm x 0.2 cm Left anterior hallux 3 cm x 2 cm x 0.2 cm Fourth toe 1.8 cm x 1.2 cm x 0.1 cm Lateral heel 1.3 cm x 0.5 cm x 0.1 cm  Angiography reviewed: She has good two-vessel run off to and through the foot via the AT and PT          Assessment:   No diagnosis found.   Plan:  Patient was evaluated and treated and all questions answered.  Ulcer right foot -Hopeful these will now heal she has much better perfusion to the foot -Continue using Santyl I reviewed the use and instructions on this. -Continue off-loading with surgical shoe  -I feel we should continue local wound care for now with enzymatic debridement -Was able to debride wounds today     Procedure: Excisional Debridement of Wound Rationale: Removal of non-viable soft tissue from the wound to promote healing.  Anesthesia: none Pre-Debridement Wound Measurements: Left dorsal hallux 1.5 cm x 1.5 cm x 0.2 cm Left anterior hallux 3 cm x 2 cm x 0.2 cm Fourth toe 1.8 cm x 1.2 cm x 0.1 cm Lateral heel 1.3 cm x 0.5 cm x 0.1 cm Post-Debridement Wound Measurements: Same as above prior debridement Type of Debridement: Sharp Excisional Tissue Removed: Non-viable soft tissue Depth of Debridement: subcutaneous  tissue. Technique: Sharp excisional debridement to bleeding, viable wound base.  Dressing: Dry, sterile, compression dressing. Disposition: Patient tolerated procedure well.   Return in about 3 weeks (around 05/04/2021) for wound care.           Return in about 3 weeks (around 05/04/2021) for wound care.

## 2021-04-17 NOTE — Progress Notes (Signed)
Bedside report completed with Nena Alexander, RN. Plan of care discussed with patient, patient verbalized understanding with no questions or needs at this time.

## 2021-04-17 NOTE — Progress Notes (Signed)
Patient requested that her sister Stanton Kidney be called, updated and password given to her. Attempt made to call sister, Stanton Kidney, with no answer and no voice mail. Patient notified that there was no answer and day RN will attempt again later this morning.

## 2021-04-17 NOTE — Consult Note (Signed)
Neurosurgery-New Consultation Evaluation 04/17/2021 ELLSIE Fischer [id number]  Identifying Statement: Cindy Fischer is a 75 y.o. female from [place] [id number] with subdural hematoma  Physician Requesting Consultation: [facility] Emergency Department  History of Present Illness: Cindy Fischer is admitted with confusion and recent falls. Her family states she was not making appropriate sense. She currently states she does feel better. 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 She is having some back pain and a compression fracture was seen on spine imaging. She does not endorse other symptoms and denies any headaches, speech issues, weakness at this time. 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   Neurosurgery is contacted given imaging findings  Past Medical History:  Past Medical History:  Diagnosis Date  . Cancer ([place]) 2016   skin (nose)  . Diabetes mellitus without complication ([place])   . Hypothyroidism   . Pneumonia   . Renal disorder     Social History: Social History   Socioeconomic History  . Marital status: Widowed    Spouse name: Not on file  . Number of children: Not on file  . Years of education: Not on file  . Highest education level: Not on file  Occupational History  . Not on file  Tobacco Use  . Smoking status: Former Smoker    Types: Cigarettes  . Smokeless tobacco: Never Used  Vaping Use  . Vaping Use: Never used  Substance and Sexual Activity  . Alcohol use: No  . Drug use: No  . Sexual activity: Not on file  Other Topics Concern  . Not on file  Social History Narrative  . Not on file   Social Determinants of Health   Financial Resource Strain: Not on file  Food Insecurity: Not on file  Transportation Needs: Not on file  Physical Activity: Not on file  Stress: Not on file  Social Connections: Not on file  Intimate Partner Violence: Not on file     Family History: Family History  Problem Relation Age of Onset  . Multiple myeloma Mother   . Diabetes Father   . Heart attack Father   . Breast cancer Neg Hx     Review of Systems:  Review of Systems - General ROS: Negative Psychological ROS: Negative Ophthalmic ROS: Negative ENT ROS: Negative Hematological and Lymphatic ROS: Negative  Endocrine ROS: Negative Respiratory ROS: Negative Cardiovascular ROS: Negative Gastrointestinal ROS: Negative Genito-Urinary ROS: Negative Musculoskeletal ROS: Negative Neurological ROS: Positive for confusion Dermatological ROS: Negative  Physical Exam: BP (!) 141/83   Pulse 100   Temp 99 F (37.2 C) (Oral)   Resp 18   Ht '5\' 2"'  (1.575 m)   Wt 70.3 kg   SpO2 95%   BMI 28.35 kg/m  Body mass index is 28.35 kg/m. Body surface area is 1.75 meters squared. General appearance: Alert, cooperative, in no acute distress Head: Normocephalic, small abrasions Eyes: Normal, EOM intact Oropharynx: Moist without lesions Ext: No edema in LE bilaterally  Neurologic exam:  Mental status: alertness: alert, orientation: person, place, time, affect: normal Speech: fluent and clear, naming and repetition are intact Cranial nerves:  II: Visual fields are full by confrontation, no ptosis III/IV/VI: extra-ocular motions intact bilaterally V/VII:no evidence of facial droop or weakness  VIII: hearing normal XI: trapezius strength symmetric,  sternocleidomastoid strength symmetric XII: tongue strength symmetric  Motor:strength symmetric 5/5, normal muscle  mass and tone in all extremities  Sensory: intact to light touch in all extremities Gait: not tested  Laboratory: Results for orders placed or performed during the hospital encounter of 04/16/21  Resp Panel by RT-PCR (Flu A&B, Covid) Nasopharyngeal Swab   Specimen: Nasopharyngeal Swab; Nasopharyngeal(NP) swabs in vial transport medium  Result Value Ref Range   SARS Coronavirus 2 by RT PCR  NEGATIVE NEGATIVE   Influenza A by PCR NEGATIVE NEGATIVE   Influenza B by PCR NEGATIVE NEGATIVE  MRSA PCR Screening   Specimen: Nasal Mucosa; Nasopharyngeal  Result Value Ref Range   MRSA by PCR NEGATIVE NEGATIVE  CBC with Differential  Result Value Ref Range   WBC 13.3 (H) 4.0 - 10.5 K/uL   RBC 4.31 3.87 - 5.11 MIL/uL   Hemoglobin 12.8 12.0 - 15.0 g/dL   HCT 38.4 36.0 - 46.0 %   MCV 89.1 80.0 - 100.0 fL   MCH 29.7 26.0 - 34.0 pg   MCHC 33.3 30.0 - 36.0 g/dL   RDW 15.5 11.5 - 15.5 %   Platelets 734 (H) 150 - 400 K/uL   nRBC 0.0 0.0 - 0.2 %   Neutrophils Relative % 87 %   Neutro Abs 11.6 (H) 1.7 - 7.7 K/uL   Lymphocytes Relative 6 %   Lymphs Abs 0.8 0.7 - 4.0 K/uL   Monocytes Relative 6 %   Monocytes Absolute 0.8 0.1 - 1.0 K/uL   Eosinophils Relative 0 %   Eosinophils Absolute 0.0 0.0 - 0.5 K/uL   Basophils Relative 0 %   Basophils Absolute 0.0 0.0 - 0.1 K/uL   Immature Granulocytes 1 %   Abs Immature Granulocytes 0.11 (H) 0.00 - 0.07 K/uL  Comprehensive metabolic panel  Result Value Ref Range   Sodium 135 135 - 145 mmol/L   Potassium 4.7 3.5 - 5.1 mmol/L   Chloride 102 98 - 111 mmol/L   CO2 22 22 - 32 mmol/L   Glucose, Bld 128 (H) 70 - 99 mg/dL   BUN 25 (H) 8 - 23 mg/dL   Creatinine, Ser 0.75 0.44 - 1.00 mg/dL   Calcium 9.3 8.9 - 10.3 mg/dL   Total Protein 6.2 (L) 6.5 - 8.1 g/dL   Albumin 3.5 3.5 - 5.0 g/dL   AST 30 15 - 41 U/L   ALT 14 0 - 44 U/L   Alkaline Phosphatase 67 38 - 126 U/L   Total Bilirubin 0.9 0.3 - 1.2 mg/dL   GFR, Estimated >60 >60 mL/min   Anion gap 11 5 - 15  CK  Result Value Ref Range   Total CK 144 38 - 234 U/L  Magnesium  Result Value Ref Range   Magnesium 1.8 1.7 - 2.4 mg/dL  TSH  Result Value Ref Range   TSH 2.040 0.350 - 4.500 uIU/mL  T4, free  Result Value Ref Range   Free T4 1.29 (H) 0.61 - 1.12 ng/dL  Urinalysis, Complete w Microscopic Urine, Clean Catch  Result Value Ref Range   Color, Urine YELLOW (A) YELLOW   APPearance  CLEAR (A) CLEAR   Specific Gravity, Urine 1.031 (H) 1.005 - 1.030   pH 5.0 5.0 - 8.0   Glucose, UA NEGATIVE NEGATIVE mg/dL   Hgb urine dipstick NEGATIVE NEGATIVE   Bilirubin Urine NEGATIVE NEGATIVE   Ketones, ur 80 (A) NEGATIVE mg/dL   Protein, ur 30 (A) NEGATIVE mg/dL   Nitrite NEGATIVE NEGATIVE   Leukocytes,Ua NEGATIVE NEGATIVE   RBC / HPF 0-5 0 - 5 RBC/hpf  WBC, UA NONE SEEN 0 - 5 WBC/hpf   Bacteria, UA NONE SEEN NONE SEEN   Squamous Epithelial / LPF 0-5 0 - 5  Basic metabolic panel  Result Value Ref Range   Sodium 133 (L) 135 - 145 mmol/L   Potassium 4.6 3.5 - 5.1 mmol/L   Chloride 104 98 - 111 mmol/L   CO2 20 (L) 22 - 32 mmol/L   Glucose, Bld 76 70 - 99 mg/dL   BUN 20 8 - 23 mg/dL   Creatinine, Ser 0.63 0.44 - 1.00 mg/dL   Calcium 8.5 (L) 8.9 - 10.3 mg/dL   GFR, Estimated >60 >60 mL/min   Anion gap 9 5 - 15  CBC  Result Value Ref Range   WBC 12.2 (H) 4.0 - 10.5 K/uL   RBC 4.09 3.87 - 5.11 MIL/uL   Hemoglobin 12.3 12.0 - 15.0 g/dL   HCT 35.9 (L) 36.0 - 46.0 %   MCV 87.8 80.0 - 100.0 fL   MCH 30.1 26.0 - 34.0 pg   MCHC 34.3 30.0 - 36.0 g/dL   RDW 15.8 (H) 11.5 - 15.5 %   Platelets 423 (H) 150 - 400 K/uL   nRBC 0.0 0.0 - 0.2 %  Hemoglobin A1c  Result Value Ref Range   Hgb A1c MFr Bld 5.1 4.8 - 5.6 %   Mean Plasma Glucose 99.67 mg/dL  Glucose, capillary  Result Value Ref Range   Glucose-Capillary 86 70 - 99 mg/dL  Glucose, capillary  Result Value Ref Range   Glucose-Capillary 88 70 - 99 mg/dL  Glucose, capillary  Result Value Ref Range   Glucose-Capillary 102 (H) 70 - 99 mg/dL  Glucose, capillary  Result Value Ref Range   Glucose-Capillary 123 (H) 70 - 99 mg/dL  Type and screen [place]  Result Value Ref Range   ABO/RH(D) A POS    Antibody Screen NEG    Sample Expiration      04/19/2021,2359 Performed at [facility], [address] [place], [place] [address]   Prepare platelet pheresis  Result Value Ref Range   Unit  Number [id number]    Blood Component Type PLTP2 PSORALEN TREATED    Unit division 00    Status of Unit REL FROM [facility]    Transfusion Status OK TO TRANSFUSE   ABO/Rh  Result Value Ref Range   ABO/RH(D)      A POS Performed at [facility], [address], [place], [place] [address]   BPAM Platelet Pheresis  Result Value Ref Range   Blood Product Unit Number [id number]    PRODUCT CODE [id number]    Unit Type and Rh 5100    Blood Product Expiration Date [id number]   Troponin I (High Sensitivity)  Result Value Ref Range   Troponin I (High Sensitivity) 13 <18 ng/L  Troponin I (High Sensitivity)  Result Value Ref Range   Troponin I (High Sensitivity) 14 <18 ng/L   I personally reviewed labs  Imaging: CT Head: Unchanged nonacute subdural hematoma around the left cerebral convexity, 6 mm in maximal thickness.  CT Lumbar Spine:  Age-indeterminate anterior superior compression deformity at L1 with approximately 40% height loss.  Multilevel degenerative changes of the thoracic and lumbar spine, most significant at L2-L3 and L3-L4 with at least moderate neural foraminal narrowing at L3-L4 on the right as well as L2-L3 on the left.    Impression/Plan:  Cindy Seif is here with a small chronic left SDH that is stable on  repeat scan and no intervention needed. We did discuss importance of fall avoidance but she is clear for any antiplatelet or anticoagulation therapy. Her lumbar spine shows chronic findings including degenerative changes and small compression deformity. These may be contributing to her back pain and recommend medical management.    1.  Diagnosis Small chronic SDH Lumbar compression deformity  2.  Plan - Can follow up as outpatient in 3-4 weeks for repeat CT head - OK for Plavix at this time  - No surgery indicated

## 2021-04-18 ENCOUNTER — Inpatient Hospital Stay: Payer: Medicare Other

## 2021-04-18 ENCOUNTER — Inpatient Hospital Stay
Admit: 2021-04-18 | Discharge: 2021-04-18 | Disposition: A | Discharge status: Home / Self Care | Payer: Medicare Other | Attending: Internal Medicine | Admitting: Internal Medicine

## 2021-04-18 DIAGNOSIS — S065X9A Traumatic subdural hemorrhage with loss of consciousness of unspecified duration, initial encounter: Secondary | ICD-10-CM | POA: Diagnosis not present

## 2021-04-18 LAB — BASIC METABOLIC PANEL
Anion gap: 8 (ref 5–15)
BUN: 13 mg/dL (ref 8–23)
CO2: 25 mmol/L (ref 22–32)
Calcium: 8.6 mg/dL — ABNORMAL LOW (ref 8.9–10.3)
Chloride: 100 mmol/L (ref 98–111)
Creatinine, Ser: 0.66 mg/dL (ref 0.44–1.00)
GFR, Estimated: >60 mL/min (ref >60)
Glucose, Bld: 102 mg/dL — ABNORMAL HIGH (ref 70–99)
Potassium: 3.8 mmol/L (ref 3.5–5.1)
Sodium: 133 mmol/L — ABNORMAL LOW (ref 135–145)

## 2021-04-18 LAB — CBC WITH DIFFERENTIAL/PLATELET
Abs Immature Granulocytes: 0.1 10*3/uL — ABNORMAL HIGH (ref 0.00–0.07)
Basophils Absolute: 0 10*3/uL (ref 0.0–0.1)
Basophils Relative: 0 %
Eosinophils Absolute: 0.1 10*3/uL (ref 0.0–0.5)
Eosinophils Relative: 1 %
HCT: 38.5 % (ref 36.0–46.0)
Hemoglobin: 12.7 g/dL (ref 12.0–15.0)
Immature Granulocytes: 1 %
Lymphocytes Relative: 15 %
Lymphs Abs: 1.5 10*3/uL (ref 0.7–4.0)
MCH: 29.9 pg (ref 26.0–34.0)
MCHC: 33 g/dL (ref 30.0–36.0)
MCV: 90.6 fL (ref 80.0–100.0)
Monocytes Absolute: 1.2 10*3/uL — ABNORMAL HIGH (ref 0.1–1.0)
Monocytes Relative: 13 %
Neutro Abs: 6.7 10*3/uL (ref 1.7–7.7)
Neutrophils Relative %: 70 %
Platelets: 603 10*3/uL — ABNORMAL HIGH (ref 150–400)
RBC: 4.25 MIL/uL (ref 3.87–5.11)
RDW: 15.7 % — ABNORMAL HIGH (ref 11.5–15.5)
WBC: 9.6 10*3/uL (ref 4.0–10.5)
nRBC: 0 % (ref 0.0–0.2)

## 2021-04-18 LAB — GLUCOSE, CAPILLARY
Glucose-Capillary: 101 mg/dL — ABNORMAL HIGH (ref 70–99)
Glucose-Capillary: 161 mg/dL — ABNORMAL HIGH (ref 70–99)
Glucose-Capillary: 114 mg/dL — ABNORMAL HIGH (ref 70–99)
Glucose-Capillary: 226 mg/dL — ABNORMAL HIGH (ref 70–99)
Glucose-Capillary: 231 mg/dL — ABNORMAL HIGH (ref 70–99)
Glucose-Capillary: 234 mg/dL — ABNORMAL HIGH (ref 70–99)

## 2021-04-18 MED ORDER — CLOPIDOGREL BISULFATE 75 MG PO TABS: 75.0000 mg | ORAL_TABLET | Freq: Every day | ORAL | Status: DC

## 2021-04-18 MED ORDER — CLOPIDOGREL BISULFATE 75 MG PO TABS
75.0000 mg | ORAL_TABLET | Freq: Every day | ORAL | Status: DC
  Administered 2021-04-18 – 2021-04-19 (×2): 75 mg via ORAL
  Filled 2021-04-18 (×2): qty 1

## 2021-04-18 MED ORDER — ASPIRIN EC 81 MG PO TBEC
81.0000 mg | DELAYED_RELEASE_TABLET | Freq: Every day | ORAL | Status: DC
  Administered 2021-04-18 – 2021-04-19 (×2): 81 mg via ORAL
  Filled 2021-04-18 (×2): qty 1

## 2021-04-18 MED ORDER — INSULIN NPH (HUMAN) (ISOPHANE) 100 UNIT/ML ~~LOC~~ SUSP: 15.0000 [IU] | Freq: Every day | SUBCUTANEOUS | Status: DC

## 2021-04-18 MED ORDER — ASPIRIN EC 81 MG PO TBEC: 81.0000 mg | DELAYED_RELEASE_TABLET | Freq: Every day | ORAL | Status: DC

## 2021-04-18 MED ORDER — INSULIN GLARGINE 100 UNIT/ML ~~LOC~~ SOLN
10.0000 [IU] | Freq: Every day | SUBCUTANEOUS | Status: DC
  Administered 2021-04-19: 10 [IU] via SUBCUTANEOUS
  Filled 2021-04-18 (×2): qty 0.1

## 2021-04-18 NOTE — Evaluation (Signed)
Occupational Therapy Evaluation Patient Details Name: Cindy Fischer MRN: [id number] DOB: 04-05-1946 Today's Date: 04/18/2021    History of Present Illness Pt admitted for subdural hematoma with complaints of fall 1 wk ago and hitting head. History includes ESRD on HD, DM, HTN, and GERD. Of note, pt with L 4/5th rib fractures.   Clinical Impression   Ms Wain was seen for OT evaluation this date. Prior to hospital admission, pt was Independent for mobility and I/ADLs. Pt lives alone with sisters available PRN. Pt presents to acute OT demonstrating impaired ADL performance and functional mobility 2/2 decreased functional balance and poor insight into deficits. Pt demonstrated poor insight into overall safety and defers HHOT at this time, agreeable to 2WW. Pt instructed in falls prevention, home/routines modifications, safe night time toileting routine, and ECS.   Pt currently requires CGA for toilet t/f - x3 LOBs requiring MIN A to correct. MOD I for UBD/LBD seated EOB. HR 120-130s during toileting. Pt would benefit from skilled OT to address noted impairments and functional limitations (see below for any additional details) in order to maximize safety and independence while minimizing falls risk and caregiver burden. Upon hospital discharge, recommend HHOT to maximize pt safety and return to functional independence during meaningful occupations of daily life.     Follow Up Recommendations  Home health OT (initial 24/7 Supervision)    Equipment Recommendations  Other (comment) (2WW)    Recommendations for Other Services       Precautions / Restrictions Precautions Precautions: Fall Restrictions Weight Bearing Restrictions: No      Mobility Bed Mobility Overal bed mobility: Needs Assistance Bed Mobility: Supine to Sit;Sit to Supine     Supine to sit: Supervision Sit to supine: Supervision   General bed mobility comments: slow movements, pt denies rib fx pain     Transfers Overall transfer level: Needs assistance Equipment used: None Transfers: Sit to/from Stand Sit to Stand: Supervision         General transfer comment: from bed and toilet height    Balance Overall balance assessment: Needs assistance Sitting-balance support: Feet supported Sitting balance-Leahy Scale: Good     Standing balance support: No upper extremity supported;During functional activity Standing balance-Leahy Scale: Fair                             ADL either performed or assessed with clinical judgement   ADL Overall ADL's : Needs assistance/impaired                                       General ADL Comments: CGA for toilet t/f - x2 minor LOBs requiring MIN A to correct. MOD I for UBD/LBD seated EOB.                  Pertinent Vitals/Pain Pain Assessment: No/denies pain     Hand Dominance Right   Extremity/Trunk Assessment Upper Extremity Assessment Upper Extremity Assessment: Generalized weakness   Lower Extremity Assessment Lower Extremity Assessment: Generalized weakness       Communication Communication Communication: No difficulties   Cognition Arousal/Alertness: Awake/alert Behavior During Therapy: WFL for tasks assessed/performed Overall Cognitive Status: Within Functional Limits for tasks assessed  General Comments  HR 120-130s with toileting    Exercises Exercises: Other exercises Other Exercises Other Exercises: Pt and family educated re: OT role, d/c recs, DME recs, falls prevention, ECS, home/routines modifications Other Exercises: LBD, toileting, hand washing, UBD, sup<>sit, sit<>stand, sitting.standing balance/tolerance, ~20 ft mobility   Shoulder Instructions      Home Living Family/patient expects to be discharged to:: Private residence Living Arrangements: Alone Available Help at Discharge: Family;Available PRN/intermittently Type  of Home: House Home Access: Stairs to enter [employer] of Steps: 5 Entrance Stairs-Rails: Can reach both Home Layout: One level               Home Equipment: Cane - single point          Prior Functioning/Environment Level of Independence: Independent with assistive device(s)        Comments: was intermittently using SPC. Reports multiple falls recently.        OT Problem List: Decreased strength;Decreased range of motion;Decreased activity tolerance;Impaired balance (sitting and/or standing);Decreased safety awareness;Decreased knowledge of use of DME or AE      OT Treatment/Interventions: Self-care/ADL training;Therapeutic exercise;Energy conservation;DME and/or AE instruction;Therapeutic activities;Patient/family education;Balance training    OT Goals(Current goals can be found in the care plan section) Acute Rehab OT Goals Patient Stated Goal: to go home OT Goal Formulation: With patient/family Time For Goal Achievement: 05/02/21 Potential to Achieve Goals: Good ADL Goals Pt Will Perform Grooming: Independently;standing Pt Will Perform Lower Body Dressing: Independently;sit to/from stand Pt Will Transfer to Toilet: Independently;ambulating;regular height toilet  OT Frequency: Min 1X/week    AM-PAC OT "6 Clicks" Daily Activity     Outcome Measure Help from another person eating meals?: None Help from another person taking care of personal grooming?: None Help from another person toileting, which includes using toliet, bedpan, or urinal?: A Little Help from another person bathing (including washing, rinsing, drying)?: A Little Help from another person to put on and taking off regular upper body clothing?: None Help from another person to put on and taking off regular lower body clothing?: None 6 Click Score: 22   End of Session    Activity Tolerance: Patient tolerated treatment well Patient left: in bed;with call bell/phone within reach;with  family/visitor present  OT Visit Diagnosis: Unsteadiness on feet (R26.81);Muscle weakness (generalized) (M62.81);History of falling (Z91.81)                Time: 0786-7544 OT Time Calculation (min): 40 min Charges:  OT General Charges $OT Visit: 1 Visit OT Evaluation $OT Eval Moderate Complexity: 1 Mod OT Treatments $Self Care/Home Management : 23-37 mins   Dessie Coma, M.S. OTR/L  04/18/21, 2:05 PM  ascom [phone]

## 2021-04-18 NOTE — Progress Notes (Signed)
*  PRELIMINARY RESULTS* Echocardiogram 2D Echocardiogram has been performed.  Cindy Fischer 04/18/2021, 8:01 PM

## 2021-04-18 NOTE — Progress Notes (Signed)
Patient home medication, Everolimus,  sent to pharmacy for hospital dispensing and found at patients bedside. Charge RN notified pharmacy and was told to give scheduled dose now and then bring medication back to pharmacy. Charge RN took medication to pharmacy as instructed and was notified at that time that the medication was now available and the patients home medication could be returned to her. Everolimus given back to patient and placed in the cabinet at bedside. Situation explained to patient and patient verbalized understanding.

## 2021-04-18 NOTE — Progress Notes (Signed)
PROGRESS NOTE    Cindy Fischer  WER:[id number] DOB: 11/18/1946 DOA: 04/16/2021 PCP: Albina Billet, MD  Brief Narrative: 75 y.o. 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  Patient not a great historian since she appears to be confused about the timeline of her falls.  Reports that she woke up on the floor this morning with severe back pain.  Last thing she remembered was reading a book at bedtime the night before.  Thinks she might have had urinary incontinence but later states that it was with a different fall.  Reportedly she had 2 falls in the last couple weeks but cannot remember the exact mechanism of the fall although thinks her legs gave out.  States she might have hit her head during one of them.  Patient lives alone and sister at bedside last saw her on Sunday and thought that she was less active than usual and was staying in bed.  Then yesterday a different sister went to visit her and thought she appeared confused.  Today she had a friend mow her lawn and he was concerned she did not answer her door.  When they found her she had already returned back to bed from her fall but was in severe pain.  Repeat head CT showed stability of subdural bleed.  Small amount of midline shift.  Patient's mental status appears to be at baseline.  4/25: Patient woke up endorsing a headache.  She was concerned about progression of bleed.  Repeated head CT which showed stability of bleed.  Dual antiplatelet therapy restarted.  Patient also has a tachyarrhythmia of unclear etiology.  Discussed that we can either have her evaluated as inpatient or potentially can have her see cardiology as outpatient.  Patient does not have an established  cardiologist and as such we will proceed with inpatient cardiac evaluation including echocardiogram and likely cardiology consult.   Assessment & Plan:   Principal Problem:   Subdural hematoma (HCC) Active Problems:   Atherosclerosis of native arteries of extremity with intermittent claudication (HCC)   Renal transplant, status post   Diabetes (HCC)   GERD (gastroesophageal reflux disease)   Hypothyroidism   Thrombocytosis   Lymphedema   Hypertensive disorder   Compression fracture of thoracolumbar vertebra (HCC)   Multiple rib fractures   Nephrolithiasis  Subdural hematoma bleed -s/p multiple falls likely due to LE claudication and on Plavix  -6-66mm thickness with 58mm left to right midline shift -pt alert and oriented to self, place and time but confused about family at bedside and having memory issues with current event - Repeat head CT: stable bleed New onset headache on 4/25, repeat head CT negative Plan: Continue frequent neurochecks Seizure precautions Restart dual antiplatelet therapy  Tachyarrhythmia Unclear etiology Asymptomatic suspect Suspect atrial fibrillation versus sinus arrhythmia with frequent PACs Plan: Continue telemetry monitoring Transfer to PCU Check echocardiogram Anticipate cardiology consultation in a.m.  T12 and L1 compression fracture - age indetermine. Neurosurgery Dr. Marsa Aris to look at imaging and give recommendation of whether she will benefit from TLSO brace - PRN pain control  Nondisplaced left anterior fourth and fifth rib fracture - PRN pain control  Thrombocytosis - Plt >700. Chronic and was uptrending since 2020 - Neurosurgery initially recommend platelet transfusion but  decided to hold off due to elevation and recommended obtain platelet assay  Rt upper pole Nephrolithiasis w/hx of kidney transplant for previous ESRD on HD - continue prednisone, Tacrolimus and Everolimus  - sees Dr. Camillia Herter at [facility]-  - Patient is  making urine with normal creatinine.  Defer nephrology consult for now  PAD s/p bilateral LE and iliac stents  -last angioplasty in March -appears to be on Plavix and aspirin  -ED physician spoke with vascular surgery Dr. Lang Snow and he recommended holding  given subdural hematoma bleed - Will need further discussion regarding timing of restart  LE lymphedema Bilateral venous Doppler ultrasound negative  Diabetes On 30 units NPH at breakfast at home with sliding scale  start with 20 U Lantus in the morning with moderate SSI  Hypertension continue Lisinopril  GERD Continue PPI  Hypothyroidism Continue levothyroxine   DVT prophylaxis: SCD Code Status: FULL Family Communication: 2 sisters at bedside Disposition Plan:Status is: Inpatient  Remains inpatient appropriate because:Inpatient level of care appropriate due to severity of illness   Dispo: The patient is from: Home              Anticipated d/c is to: Home              Patient currently is not medically stable to d/c.   Difficult to place patient No  Stable subdural bleed.  No plans for surgical intervention.  Therapy consulted and recommended home health.  We will plan to monitor overnight, cardiology consultation in a.m.  Disposition plan likely within 24 to 48 hours.     Level of care: Progressive Cardiac  Consultants:   Neurosurgery   Procedures:None  Antimicrobials:   None   Subjective: Patient seen and examined.  Reports no apparent distress.  Alert and oriented x3  Objective: Vitals:   04/18/21 0600 04/18/21 0800 04/18/21 1000 04/18/21 1200  BP: (!) 157/67 133/73 (!) 121/59 (!) 141/68  Pulse: 74 83 80 74  Resp: 20 20 (!) 22 19  Temp:  99 F (37.2 C)    TempSrc:  Oral  Oral  SpO2: 96% 96% 98% 99%  Weight:      Height:        Intake/Output Summary (Last 24 hours) at 04/18/2021 1425 Last data filed at 04/18/2021 0400 Gross per 24 hour  Intake 340 ml  Output 1450 ml  Net -1110 ml    Filed Weights   04/16/21 1452 04/17/21 0030  Weight: 71.7 kg 70.3 kg    Examination:  General exam: No apparent distress Respiratory system: Clear to auscultation. Respiratory effort normal. Cardiovascular system: Tachycardic, irregular rhythm, no murmurs Gastrointestinal system: Abdomen is nondistended, soft and nontender. No organomegaly or masses felt. Normal bowel sounds heard. Central nervous system: Alert and oriented. No focal neurological deficits. Extremities: Symmetric 5 x 5 power. Skin: No rashes, lesions or ulcers Psychiatry: Judgement and insight appear normal. Mood & affect appropriate.     Data Reviewed: I have personally reviewed following labs and imaging studies  CBC: Recent Labs  Lab 04/16/21 1538 04/17/21 0447 04/18/21 0823  WBC 13.3* 12.2* 9.6  NEUTROABS 11.6*  --  6.7  HGB 12.8 12.3 12.7  HCT 38.4 35.9* 38.5  MCV 89.1 87.8 90.6  PLT 734* 423* 970*   Basic Metabolic Panel: Recent Labs  Lab 04/16/21 1538 04/17/21 0447 04/18/21 0823  NA 135 133* 133*  K 4.7 4.6 3.8  CL 102 104 100  CO2 22 20* 25  GLUCOSE 128* 76  102*  BUN 25* 20 13  CREATININE 0.75 0.63 0.66  CALCIUM 9.3 8.5* 8.6*  MG 1.8  --   --    GFR: Estimated Creatinine Clearance: 55.8 mL/min (by C-G formula based on SCr of 0.66 mg/dL). Liver Function Tests: Recent Labs  Lab 04/16/21 1538  AST 30  ALT 14  ALKPHOS 67  BILITOT 0.9  PROT 6.2*  ALBUMIN 3.5   No results for input(s): LIPASE, AMYLASE in the last 168 hours. No results for input(s): AMMONIA in the last 168 hours. Coagulation Profile: No results for input(s): INR, PROTIME in the last 168 hours. Cardiac Enzymes: Recent Labs  Lab 04/16/21 1538  CKTOTAL 144   BNP (last 3 results) No results for input(s): PROBNP in the last 8760 hours. HbA1C: Recent Labs    04/16/21 1455  HGBA1C 5.1   CBG: Recent Labs  Lab 04/17/21 2147 04/17/21 2212 04/18/21 0357 04/18/21 0733 04/18/21 1141  GLUCAP 61* 98 101*  161* 114*   Lipid Profile: No results for input(s): CHOL, HDL, LDLCALC, TRIG, CHOLHDL, LDLDIRECT in the last 72 hours. Thyroid Function Tests: Recent Labs    04/16/21 1538  TSH 2.040  FREET4 1.29*   Anemia Panel: No results for input(s): VITAMINB12, FOLATE, FERRITIN, TIBC, IRON, RETICCTPCT in the last 72 hours. Sepsis Labs: No results for input(s): PROCALCITON, LATICACIDVEN in the last 168 hours.  Recent Results (from the past 240 hour(s))  Resp Panel by RT-PCR (Flu A&B, Covid) Nasopharyngeal Swab     Status: None   Collection Time: 04/16/21  3:41 PM   Specimen: Nasopharyngeal Swab; Nasopharyngeal(NP) swabs in vial transport medium  Result Value Ref Range Status   SARS Coronavirus 2 by RT PCR NEGATIVE NEGATIVE Final    Comment: (NOTE) SARS-CoV-2 target nucleic acids are NOT DETECTED.  The SARS-CoV-2 RNA is generally detectable in upper respiratory specimens during the acute phase of infection. The lowest concentration of SARS-CoV-2 viral copies this assay can detect is 138 copies/mL. A negative result does not preclude SARS-Cov-2 infection and should not be used as the sole basis for treatment or other patient management decisions. A negative result may occur with  improper specimen collection/handling, submission of specimen other than nasopharyngeal swab, presence of viral mutation(s) within the areas targeted by this assay, and inadequate number of viral copies(<138 copies/mL). A negative result must be combined with clinical observations, patient history, and epidemiological information. The expected result is Negative.  Fact Sheet for Patients:  EntrepreneurPulse.com.au  Fact Sheet for Healthcare Providers:  IncredibleEmployment.be  This test is no t yet approved or cleared by the [place] FDA and  has been authorized for detection and/or diagnosis of SARS-CoV-2 by FDA under an Emergency Use Authorization (EUA). This EUA will  remain  in effect (meaning this test can be used) for the duration of the COVID-19 declaration under Section 564(b)(1) of the Act, 21 U.S.C.section 360bbb-3(b)(1), unless the authorization is terminated  or revoked sooner.       Influenza A by PCR NEGATIVE NEGATIVE Final   Influenza B by PCR NEGATIVE NEGATIVE Final    Comment: (NOTE) The Xpert Xpress SARS-CoV-2/FLU/RSV plus assay is intended as an aid in the diagnosis of influenza from Nasopharyngeal swab specimens and should not be used as a sole basis for treatment. Nasal washings and aspirates are unacceptable for Xpert Xpress SARS-CoV-2/FLU/RSV testing.  Fact Sheet for Patients: EntrepreneurPulse.com.au  Fact Sheet for Healthcare Providers: IncredibleEmployment.be  This test is not yet approved or cleared by the [place] FDA  and has been authorized for detection and/or diagnosis of SARS-CoV-2 by FDA under an Emergency Use Authorization (EUA). This EUA will remain in effect (meaning this test can be used) for the duration of the COVID-19 declaration under Section 564(b)(1) of the Act, 21 U.S.C. section 360bbb-3(b)(1), unless the authorization is terminated or revoked.  Performed at [facility], [address] [place], [place] [address]   MRSA PCR Screening     Status: None   Collection Time: 04/17/21 12:59 AM   Specimen: Nasal Mucosa; Nasopharyngeal  Result Value Ref Range Status   MRSA by PCR NEGATIVE NEGATIVE Final    Comment:        The GeneXpert MRSA Assay (FDA approved for NASAL specimens only), is one component of a comprehensive MRSA colonization surveillance program. It is not intended to diagnose MRSA infection nor to guide or monitor treatment for MRSA infections. Performed at [facility], [address], [place], [place] [address]          Radiology Studies: CT HEAD WO CONTRAST  Result Date: 04/18/2021 CLINICAL DATA:  Inpatient.  New headache this morning. Recent falls. Left subdural hematoma. EXAM: CT HEAD WITHOUT CONTRAST TECHNIQUE: Contiguous axial images were obtained from the base of the skull through the vertex without intravenous contrast. COMPARISON:  04/17/2021 head CT. FINDINGS: Brain: Left frontal cerebral convexity subdural hematoma with fairly low attenuation, maximum thickness 7 mm, previously 7 mm using similar measurement technique, stable. No evidence of parenchymal hemorrhage or new extra-axial fluid collection. Stable mild left cerebral sulcal effacement. Mild 3 mm right midline shift, previously 3 mm, stable. No mass lesion. No CT evidence of acute infarction. Nonspecific mild to moderate subcortical and periventricular white matter hypodensity, most in keeping with chronic small vessel ischemic change. Cerebral volume is age appropriate. Stable mass-effect on the left lateral ventricle. Mild dilatation of the temporal horn of the right lateral ventricle is unchanged. Vascular: No acute abnormality. Skull: No evidence of calvarial fracture. Sinuses/Orbits: No fluid levels. Mild mucoperiosteal thickening throughout the ethmoidal air cells bilaterally. Other:  The mastoid air cells are unopacified. IMPRESSION: 1. Stable left frontal cerebral convexity subdural hematoma with maximum thickness 7 mm. Stable mild left cerebral sulcal effacement and 3 mm right midline shift. 2. Stable mild dilatation of the temporal horn of the right lateral ventricle. 3. Mild-to-moderate chronic small vessel ischemic changes in the cerebral white matter. 4. Mild paranasal sinusitis, chronic appearing. Electronically Signed   By: Ilona Sorrel M.D.   On: 04/18/2021 09:20   CT HEAD WO CONTRAST  Result Date: 04/17/2021 CLINICAL DATA:  Follow-up subdural hematoma EXAM: CT HEAD WITHOUT CONTRAST TECHNIQUE: Contiguous axial images were obtained from the base of the skull through the vertex without intravenous contrast. COMPARISON:  Yesterday  FINDINGS: Brain: The subdural hematoma along the left cerebral convexity with primarily low-density is non progressed. Maximal thickness is 6 mm and midline shift measures 3 mm. No cortical infarct, hydrocephalus, or masslike finding. Vascular: No hyperdense vessel or unexpected calcification. Skull: Normal. Negative for fracture or focal lesion. Sinuses/Orbits: Retention cyst in the inferior right maxillary sinus. Bilateral cataract resection. IMPRESSION: Unchanged nonacute subdural hematoma around the left cerebral convexity, 6 mm in maximal thickness. Electronically Signed   By: Monte Fantasia M.D.   On: 04/17/2021 05:19   CT Head Wo Contrast  Result Date: 04/16/2021 CLINICAL DATA:  Unwitnessed fall 1 week ago.  Increasing confusion EXAM: CT HEAD WITHOUT CONTRAST CT CERVICAL SPINE WITHOUT CONTRAST TECHNIQUE: Multidetector CT imaging of  the head and cervical spine was performed following the standard protocol without intravenous contrast. Multiplanar CT image reconstructions of the cervical spine were also generated. COMPARISON:  None. FINDINGS: CT HEAD FINDINGS Brain: Subacute appearing subdural collection overlying the left cerebral convexity measuring up to 6-7 mm in thickness (series 3, image 13). 4 mm left to right midline shift. No evidence of intraparenchymal hemorrhage. No hydrocephalus. No mass lesion identified. Mild-moderate low-density changes within the periventricular and subcortical white matter compatible with chronic microvascular ischemic change. Mild diffuse cerebral volume loss. Vascular: Atherosclerotic calcifications involving the large vessels of the skull base. No unexpected hyperdense vessel. Skull: Normal. Negative for fracture or focal lesion. Sinuses/Orbits: Mild bilateral ethmoid mucosal thickening. Other: Negative for scalp hematoma. CT CERVICAL SPINE FINDINGS Alignment: Facet joints are aligned without dislocation or traumatic listhesis. Dens and lateral masses are aligned.  Skull base and vertebrae: No acute fracture. No primary bone lesion or focal pathologic process. Soft tissues and spinal canal: No prevertebral fluid or swelling. No visible canal hematoma. Disc levels: Multilevel degenerative disc disease within the cervical spine most pronounced from C4-5 through C7-T1. Mild multilevel facet arthropathy. No evidence of high-grade canal stenosis by CT. Upper chest: Included lung apices are clear. Other: None. IMPRESSION: 1. Subacute appearing subdural hematoma overlying the left cerebral convexity measuring up to 6-7 mm in thickness with associated 4 mm left to right midline shift. 2. Chronic microvascular ischemic changes and cerebral volume loss. 3. No evidence of acute fracture or traumatic listhesis of the cervical spine. 4. Multilevel degenerative disc disease and facet arthropathy of the cervical spine. Critical Value/emergent results were called by telephone at the time of interpretation on 04/16/2021 at 5:28 pm to provider [facility] , who verbally acknowledged these results. Electronically Signed   By: Davina Poke D.O.   On: 04/16/2021 17:30   CT Cervical Spine Wo Contrast  Result Date: 04/16/2021 CLINICAL DATA:  Unwitnessed fall 1 week ago.  Increasing confusion EXAM: CT HEAD WITHOUT CONTRAST CT CERVICAL SPINE WITHOUT CONTRAST TECHNIQUE: Multidetector CT imaging of the head and cervical spine was performed following the standard protocol without intravenous contrast. Multiplanar CT image reconstructions of the cervical spine were also generated. COMPARISON:  None. FINDINGS: CT HEAD FINDINGS Brain: Subacute appearing subdural collection overlying the left cerebral convexity measuring up to 6-7 mm in thickness (series 3, image 13). 4 mm left to right midline shift. No evidence of intraparenchymal hemorrhage. No hydrocephalus. No mass lesion identified. Mild-moderate low-density changes within the periventricular and subcortical white matter compatible with chronic  microvascular ischemic change. Mild diffuse cerebral volume loss. Vascular: Atherosclerotic calcifications involving the large vessels of the skull base. No unexpected hyperdense vessel. Skull: Normal. Negative for fracture or focal lesion. Sinuses/Orbits: Mild bilateral ethmoid mucosal thickening. Other: Negative for scalp hematoma. CT CERVICAL SPINE FINDINGS Alignment: Facet joints are aligned without dislocation or traumatic listhesis. Dens and lateral masses are aligned. Skull base and vertebrae: No acute fracture. No primary bone lesion or focal pathologic process. Soft tissues and spinal canal: No prevertebral fluid or swelling. No visible canal hematoma. Disc levels: Multilevel degenerative disc disease within the cervical spine most pronounced from C4-5 through C7-T1. Mild multilevel facet arthropathy. No evidence of high-grade canal stenosis by CT. Upper chest: Included lung apices are clear. Other: None. IMPRESSION: 1. Subacute appearing subdural hematoma overlying the left cerebral convexity measuring up to 6-7 mm in thickness with associated 4 mm left to right midline shift. 2. Chronic microvascular ischemic changes and cerebral volume  loss. 3. No evidence of acute fracture or traumatic listhesis of the cervical spine. 4. Multilevel degenerative disc disease and facet arthropathy of the cervical spine. Critical Value/emergent results were called by telephone at the time of interpretation on 04/16/2021 at 5:28 pm to provider [facility] , who verbally acknowledged these results. Electronically Signed   By: Davina Poke D.O.   On: 04/16/2021 17:30   US Venous Img Lower Bilateral  Result Date: 04/16/2021 CLINICAL DATA:  Swelling EXAM: BILATERAL LOWER EXTREMITY VENOUS DOPPLER ULTRASOUND TECHNIQUE: Gray-scale sonography with compression, as well as color and duplex ultrasound, were performed to evaluate the deep venous system(s) from the level of the common femoral vein through the popliteal and  proximal calf veins. COMPARISON:  None. FINDINGS: VENOUS Normal compressibility of the common femoral, superficial femoral, and popliteal veins, as well as the visualized calf veins. Visualized portions of profunda femoral vein and great saphenous vein unremarkable. No filling defects to suggest DVT on grayscale or color Doppler imaging. Doppler waveforms show normal direction of venous flow, normal respiratory plasticity and response to augmentation. Limited views of the contralateral common femoral vein are unremarkable. OTHER None. Limitations: none IMPRESSION: Negative. Electronically Signed   By: Dorise Bullion III M.D   On: 04/16/2021 17:58   CT T-SPINE NO CHARGE  Result Date: 04/16/2021 CLINICAL DATA:  Fall 1 week ago with back pain EXAM: CT THORACIC AND LUMBAR SPINE WITHOUT CONTRAST TECHNIQUE: Multidetector CT imaging of the thoracic and lumbar spine was performed without contrast. Multiplanar CT image reconstructions were also generated. COMPARISON:  None. FINDINGS: CT THORACIC SPINE FINDINGS Alignment: Mild dextroconvex curvature of the thoracic spine. No evidence of traumatic listhesis. Vertebrae: Anterior superior compression deformity of the T12 vertebral body with approximately 25% height loss. Paraspinal and other soft tissues: Negative. Disc levels: Multilevel degenerative change of the thoracic spine without significant neural foraminal or canal narrowing. CT LUMBAR SPINE FINDINGS Segmentation: 5 lumbar type vertebrae. Alignment: No evidence of traumatic listhesis. Vertebrae: Age-indeterminate anterior superior compression deformity at L1 with approximately 40% height loss. Paraspinal and other soft tissues: Negative. Disc levels: Multilevel degenerative changes spine most significant at L2-L3 and L3-L4 with at least moderate neural foraminal narrowing at L3-L4 on the right as well as L2-L3 on the left. No significant canal narrowing. IMPRESSION: CT THORACIC SPINE IMPRESSION Age-indeterminate  anterior superior compression deformity at T12 vertebral body with approximately 25% height loss. Multilevel degenerative change of the spine without significant neural foraminal or canal narrowing. CT LUMBAR SPINE IMPRESSION Age-indeterminate anterior superior compression deformity at L1 with approximately 40% height loss. Multilevel degenerative changes of the thoracic and lumbar spine, most significant at L2-L3 and L3-L4 with at least moderate neural foraminal narrowing at L3-L4 on the right as well as L2-L3 on the left. Electronically Signed   By: Dahlia Bailiff MD   On: 04/16/2021 17:41   CT L-SPINE NO CHARGE  Result Date: 04/16/2021 CLINICAL DATA:  Fall 1 week ago with back pain EXAM: CT THORACIC AND LUMBAR SPINE WITHOUT CONTRAST TECHNIQUE: Multidetector CT imaging of the thoracic and lumbar spine was performed without contrast. Multiplanar CT image reconstructions were also generated. COMPARISON:  None. FINDINGS: CT THORACIC SPINE FINDINGS Alignment: Mild dextroconvex curvature of the thoracic spine. No evidence of traumatic listhesis. Vertebrae: Anterior superior compression deformity of the T12 vertebral body with approximately 25% height loss. Paraspinal and other soft tissues: Negative. Disc levels: Multilevel degenerative change of the thoracic spine without significant neural foraminal or canal narrowing. CT LUMBAR SPINE  FINDINGS Segmentation: 5 lumbar type vertebrae. Alignment: No evidence of traumatic listhesis. Vertebrae: Age-indeterminate anterior superior compression deformity at L1 with approximately 40% height loss. Paraspinal and other soft tissues: Negative. Disc levels: Multilevel degenerative changes spine most significant at L2-L3 and L3-L4 with at least moderate neural foraminal narrowing at L3-L4 on the right as well as L2-L3 on the left. No significant canal narrowing. IMPRESSION: CT THORACIC SPINE IMPRESSION Age-indeterminate anterior superior compression deformity at T12 vertebral  body with approximately 25% height loss. Multilevel degenerative change of the spine without significant neural foraminal or canal narrowing. CT LUMBAR SPINE IMPRESSION Age-indeterminate anterior superior compression deformity at L1 with approximately 40% height loss. Multilevel degenerative changes of the thoracic and lumbar spine, most significant at L2-L3 and L3-L4 with at least moderate neural foraminal narrowing at L3-L4 on the right as well as L2-L3 on the left. Electronically Signed   By: Dahlia Bailiff MD   On: 04/16/2021 17:41   CT CHEST ABDOMEN PELVIS WO CONTRAST  Result Date: 04/16/2021 CLINICAL DATA:  Fall 1 week ago with back pain and right leg swelling. EXAM: CT CHEST, ABDOMEN AND PELVIS WITHOUT CONTRAST TECHNIQUE: Multidetector CT imaging of the chest, abdomen and pelvis was performed following the standard protocol without IV contrast. COMPARISON:  None. FINDINGS: CT CHEST FINDINGS Cardiovascular: Aortic atherosclerosis. No thoracic aortic aneurysm. Three vessel dense coronary artery calcifications. Calcifications of the mitral annulus and aortic valve. Normal size heart. Trace pericardial fluid, likely physiologic. Mediastinum/Nodes: No discrete thyroid nodularity. No pathologically enlarged mediastinal or axillary lymph nodes. The trachea and esophagus are grossly unremarkable. Small hiatal hernia. Lungs/Pleura: Right middle lobe and lingular atelectasis. No evidence of pulmonary contusion or hemorrhage. No focal consolidation. No pleural effusion. No pneumothorax. Musculoskeletal: Nondisplaced left anterior fourth and fifth rib fractures. CT ABDOMEN PELVIS FINDINGS Hepatobiliary: Unremarkable noncontrast appearance of the hepatic parenchyma. Gallbladder is unremarkable. No biliary ductal dilation. Pancreas: Fatty pancreatic atrophy. Spleen: No splenic injury or perisplenic hematoma. Adrenals/Urinary Tract: Left adrenal thickening. Right adrenal glands unremarkable. Native bilateral kidneys are  atrophic with calcifications without hydronephrosis. Right lower quadrant transplant kidney. No transplant kidney hydronephrosis. 3 mm nonobstructive stone in the upper pole of the transplant kidney. Urinary bladder is grossly unremarkable for degree of distension. Stomach/Bowel: Small hiatal hernia otherwise the stomach is grossly unremarkable. No suspicious small bowel dilation. No no evidence of acute of colonic inflammation or obstruction. Vascular/Lymphatic: Aortic atherosclerosis. No abdominal aortic aneurysm. Vascular stents versus dense calcifications in the bilateral common iliac arteries. No enlarged abdominal or pelvic lymph nodes. Reproductive: Uterus and bilateral adnexa are unremarkable. Other: No abdominopelvic ascites. Postsurgical change in the right anterior abdominopelvic wall. Musculoskeletal: Age indeterminate anterior superior endplate compression deformities at T12 and L1 with approximately 25% height loss at T12 and 40% height loss at L1. No evidence of traumatic listhesis. Multilevel degenerative change of the spine most significant at L2-L3 and L3-L4. IMPRESSION: 1. Nondisplaced left anterior fourth and fifth rib fractures. No pneumothorax. 2. Age indeterminate anterior superior endplate compression deformities at T12 and L1 with approximately 25% height loss at T12 and 40% height loss at L1. 3. Right lower quadrant transplant kidney without hydronephrosis. 3 mm nonobstructive stone in the upper pole of the transplant kidney. 4. Small hiatal hernia. 5. Aortic atherosclerosis. Aortic Atherosclerosis (ICD10-I70.0). Electronically Signed   By: Dahlia Bailiff MD   On: 04/16/2021 17:42        Scheduled Meds: . aspirin EC  81 mg Oral Daily  . atorvastatin  20 mg  Oral Daily  . Chlorhexidine Gluconate Cloth  6 each Topical Daily  . clopidogrel  75 mg Oral Daily  . Everolimus  1 mg Oral BID  . insulin aspart  0-15 Units Subcutaneous TID WC  . insulin aspart  0-5 Units Subcutaneous QHS   . [START ON 04/19/2021] insulin glargine  10 Units Subcutaneous Daily  . levothyroxine  100 mcg Oral Once per day on Sun Mon Tue Wed Thu Fri  . lisinopril  10 mg Oral Daily  . pantoprazole  40 mg Oral Daily  . predniSONE  5 mg Oral Daily  . tacrolimus  1 mg Oral QHS   And  . tacrolimus  1.5 mg Oral Daily   Continuous Infusions:   LOS: 2 days    Time spent: 25 minutes    Sidney Ace, MD [facility] Pager [phone]  If 7PM-7AM, please contact night-coverage 04/18/2021, 2:25 PM

## 2021-04-18 NOTE — Evaluation (Signed)
Physical Therapy Evaluation Patient Details Name: Cindy Fischer MRN: [id number] DOB: 01/15/1946 Today's Date: 04/18/2021   History of Present Illness  Pt admitted for subdural hematoma with complaints of fall 1 wk ago and hitting head. History includes ESRD on HD, DM, HTN, and GERD. Of note, pt with L 4/5th rib fractures.  Clinical Impression  Pt is a pleasant 75 year old female who was admitted for subdural hematoma. Repeat imaging performed and pt cleared to participate. No headache noted. Pt performs bed mobility/transfers with superviison and ambulation with cga and no AD. Pt demonstrates deficits with balance/strength. Poor insight into deficits, currently refusing HHPT.  Would benefit from skilled PT to address above deficits and promote optimal return to PLOF. Recommend transition to [place] upon discharge from acute hospitalization.     Follow Up Recommendations Home health PT (however pt currently refusing)    Equipment Recommendations  Rolling walker with 5" wheels    Recommendations for Other Services       Precautions / Restrictions Precautions Precautions: Fall Restrictions Weight Bearing Restrictions: No      Mobility  Bed Mobility Overal bed mobility: Needs Assistance Bed Mobility: Supine to Sit     Supine to sit: Supervision     General bed mobility comments: Safe technique with slow movement. Once seated at EOB, upright posture noted    Transfers Overall transfer level: Needs assistance Equipment used: None Transfers: Sit to/from Stand Sit to Stand: Supervision         General transfer comment: safe technique with ability to push from seated surface. Once standing, upright posture noted. Slight sway, however no formal LOB noted  Ambulation/Gait Ambulation/Gait assistance: Min guard Gait Distance (Feet): 30 Feet Assistive device: None Gait Pattern/deviations: Step-through pattern     General Gait Details: ambulated in room, pt fatigues with  increased exertion. Slight unsteadiness. Able to complete turns  Financial trader Rankin (Stroke Patients Only)       Balance Overall balance assessment: Needs assistance Sitting-balance support: Feet supported Sitting balance-Leahy Scale: Good     Standing balance support: Bilateral upper extremity supported Standing balance-Leahy Scale: Good                               Pertinent Vitals/Pain Pain Assessment: No/denies pain    Home Living Family/patient expects to be discharged to:: Private residence Living Arrangements: Alone Available Help at Discharge: Family;Available PRN/intermittently Type of Home: House Home Access: Stairs to enter Entrance Stairs-Rails: Can reach both Entrance Stairs-Number of Steps: 5 Home Layout: One level Home Equipment: Cane - single point      Prior Function Level of Independence: Independent with assistive device(s)         Comments: was intermittently using SPC. Reports multiple falls recently.     Hand Dominance        Extremity/Trunk Assessment   Upper Extremity Assessment Upper Extremity Assessment: Generalized weakness (B UE shoulder flexion 2/5; grip 3/5)    Lower Extremity Assessment Lower Extremity Assessment: Generalized weakness (R LE grossly 3+/5; L LE grossly 4/5)       Communication   Communication: No difficulties  Cognition Arousal/Alertness: Awake/alert Behavior During Therapy: WFL for tasks assessed/performed Overall Cognitive Status: Within Functional Limits for tasks assessed  General Comments      Exercises     Assessment/Plan    PT Assessment Patient needs continued PT services  PT Problem List Decreased strength;Decreased balance;Decreased mobility       PT Treatment Interventions DME instruction;Gait training;Therapeutic exercise;Balance training    PT Goals (Current goals can  be found in the Care Plan section)  Acute Rehab PT Goals Patient Stated Goal: to go home PT Goal Formulation: With patient Time For Goal Achievement: 05/02/21 Potential to Achieve Goals: Good    Frequency Min 2X/week   Barriers to discharge        Co-evaluation               AM-PAC PT "6 Clicks" Mobility  Outcome Measure Help needed turning from your back to your side while in a flat bed without using bedrails?: A Little Help needed moving from lying on your back to sitting on the side of a flat bed without using bedrails?: A Little Help needed moving to and from a bed to a chair (including a wheelchair)?: A Little Help needed standing up from a chair using your arms (e.g., wheelchair or bedside chair)?: A Little Help needed to walk in hospital room?: A Little Help needed climbing 3-5 steps with a railing? : A Little 6 Click Score: 18    End of Session   Activity Tolerance: Patient tolerated treatment well Patient left: in bed Nurse Communication: Mobility status PT Visit Diagnosis: Unsteadiness on feet (R26.81);Repeated falls (R29.6);Muscle weakness (generalized) (M62.81)    Time: 1224-8250 PT Time Calculation (min) (ACUTE ONLY): 25 min   Charges:   PT Evaluation $PT Eval Low Complexity: 1 Low PT Treatments $Gait Training: 8-22 mins        Greggory Stallion, PT, DPT [phone]   Telicia Hodgkiss 04/18/2021, 1:36 PM

## 2021-04-18 NOTE — Progress Notes (Signed)
Patient declined prevalon boot. Educated on wound care recommendations, pt verbalized understanding.

## 2021-04-19 DIAGNOSIS — S065X9A Traumatic subdural hemorrhage with loss of consciousness of unspecified duration, initial encounter: Secondary | ICD-10-CM | POA: Diagnosis not present

## 2021-04-19 LAB — BASIC METABOLIC PANEL
Anion gap: 9 (ref 5–15)
BUN: 15 mg/dL (ref 8–23)
CO2: 25 mmol/L (ref 22–32)
Calcium: 8.7 mg/dL — ABNORMAL LOW (ref 8.9–10.3)
Chloride: 103 mmol/L (ref 98–111)
Creatinine, Ser: 0.75 mg/dL (ref 0.44–1.00)
GFR, Estimated: >60 mL/min (ref >60)
Glucose, Bld: 93 mg/dL (ref 70–99)
Potassium: 3.9 mmol/L (ref 3.5–5.1)
Sodium: 137 mmol/L (ref 135–145)

## 2021-04-19 LAB — GLUCOSE, CAPILLARY
Glucose-Capillary: 116 mg/dL — ABNORMAL HIGH (ref 70–99)
Glucose-Capillary: 123 mg/dL — ABNORMAL HIGH (ref 70–99)
Glucose-Capillary: 184 mg/dL — ABNORMAL HIGH (ref 70–99)

## 2021-04-19 LAB — MAGNESIUM: Magnesium: 1.8 mg/dL (ref 1.7–2.4)

## 2021-04-19 NOTE — Progress Notes (Addendum)
1530 Discharge teaching done. Questions answered. Patient decided she wanted a walker to take home. Devin with OT called and ask about walker per patients instructions. Devin OT  then text Teresita  SW to obtain walker. 1615 awaiting word from SW concerning walker. 1655 Discharge home with walker from [place].

## 2021-04-19 NOTE — Discharge Summary (Signed)
Physician Discharge Summary  Cindy Fischer JQB:[id number] DOB: 1946-03-11 DOA: 04/16/2021  PCP: Albina Billet, MD  Admit date: 04/16/2021 Discharge date: 04/19/2021  Admitted From: Home Disposition: Home with home health  Recommendations for Outpatient Follow-up:  1. Follow up with PCP in 1-2 weeks 2. 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  Patient not a great historian since she appears to be confused about the timeline of her falls. Reports that she woke up on the floor this morning with severe back pain. Last thing she remembered was reading a book atbedtime the night before. Thinks she might havehad urinary incontinence but later states that it was with a different fall.  Reportedly she had 2 falls in the last couple weeks but cannot remember the exact mechanism of the fallalthoughthinks her legsgave out. States she might have hit her head duringone of them. Patient lives alone and sister at bedside last saw her on Sunday and thought that she was less active than usual and was staying in bed. Then yesterday a different sister went to visit her and thought she appeared confused. Today she had a friend mow her lawn and he was concernedshe did not answer her door. When they found her she had already returned back to bed from her fall but was in severe pain.  Repeat head CT showed stability of subdural bleed.  Small amount of midline  shift.  Patient's mental status appears to be at baseline.  On the day of discharge patient was noted to have a tachyarrhythmia of unclear etiology.  Atrial fibrillation versus sinus arrhythmia with frequent PACs.  Echocardiogram completed, results pending at time of diagnosis.  Offered inpatient cardiology consultation patient elected to follow-up as outpatient.  She is hemodynamically stable and rate controlled at time of discharge.  She will either see the [facility] cardiologist post discharge or if she speak to her primary about referral to another cardiologist.  Patient was seen by Dr. Lacinda Axon from neurosurgery and medically stable for discharge and resumption of dual antiplatelet therapy from a standpoint.   Discharge Diagnoses:  Principal Problem:   Subdural hematoma (HCC) Active Problems:   Atherosclerosis of native arteries of extremity with intermittent claudication (HCC)   Renal transplant, status post   Diabetes (HCC)   GERD (gastroesophageal reflux disease)   Hypothyroidism   Thrombocytosis   Lymphedema   Hypertensive disorder   Compression fracture of thoracolumbar vertebra (HCC)   Multiple rib fractures   Nephrolithiasis  Subdural hematoma bleed -s/p multiple falls likely due to LE claudication and on Plavix  -6-48mm thickness with 54mm left to right midline shift -pt alert and oriented to self, place and time but confused about family at bedside and having memory issues with current event - Repeat head CT: stable bleed Discharge plan: No further neurosurgery recommendations No indication for antiepileptic therapy Resume DAPT Follow-up outpatient with Dr. Lacinda Axon in 3 to 4 weeks  T12 and L1 compression fracture - age indetermine. Neurosurgery Dr. 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 Camillia Herter at [facility]-  - Patient is making urine with normal creatinine.  Defer nephrology consult for now  PAD s/p bilateral LE and iliac stents -last angioplasty in March -appears to be on Plavix and aspirin  -ED physician spoke with vascular surgery Dr. Lang Snow and he recommended holding given subdural hematoma bleed -Cleared by neurosurgery for restarting DAPT  LElymphedema Bilateral venous Doppler ultrasoundnegative  Diabetes Resume home regimen at time of discharge  Hypertension continue Lisinopril  GERD Continue PPI  Hypothyroidism Continue levothyroxine  Discharge Instructions  Discharge Instructions    Diet - low sodium heart healthy   Complete by: As directed    Increase activity slowly   Complete by: As directed    No wound care   Complete by: As directed      Allergies as of 04/19/2021      Reactions   Latex Itching   Voltaren [diclofenac Sodium] Diarrhea, Nausea And Vomiting   Oxycodone-acetaminophen Nausea And Vomiting   Penicillins Rash   Has patient had a PCN reaction causing immediate rash, facial/tongue/throat swelling, SOB or lightheadedness with hypotension: Yes Has patient had a PCN reaction causing severe rash involving mucus membranes or skin necrosis: No Has patient had a PCN reaction that required hospitalization: No Has patient had a PCN reaction occurring within the last 10 years: No If all of the above answers are "NO", then may proceed with Cephalosporin use.      Medication List    STOP taking these medications   Santyl ointment Generic drug: collagenase   sulfamethoxazole-trimethoprim 800-160 MG  tablet Commonly known as: Bactrim DS     TAKE these medications   acetaminophen 650 MG CR tablet Commonly known as: TYLENOL Take 1,300 mg by mouth every 8 (eight) hours as needed for pain.   aspirin EC 81 MG tablet Take 81 mg by mouth daily.   atorvastatin 20 MG tablet Commonly known as: LIPITOR Take 20 mg by mouth daily.   clopidogrel 75 MG tablet Commonly known as: Plavix Take 1 tablet (75 mg total) by mouth daily.   Everolimus 0.5 MG Tabs Take 1 mg by mouth 2 (two) times daily.   insulin NPH Human 100 UNIT/ML injection Commonly known as: NOVOLIN N Inject 30 Units into the skin daily before breakfast.   insulin regular 100 units/mL injection Commonly known as: NOVOLIN R Inject 3-10 Units into the skin 3 (three) times daily as needed for high blood sugar. Sliding scale   levothyroxine 100 MCG tablet Commonly known as: SYNTHROID Take 100 mcg by mouth See admin instructions. Take 100 mcg daily except skip dose on Saturdays   lisinopril 10 MG tablet Commonly known as: ZESTRIL Take 10 mg by mouth daily.   omeprazole 40 MG capsule Commonly known as: PRILOSEC Take 40 mg by mouth every other day.   predniSONE 5 MG tablet Commonly known as: DELTASONE Take 5 mg by mouth daily.   SYSTANE ULTRA OP Place 1 drop into both eyes daily as needed (Dry eyes).   tacrolimus 0.5 MG capsule Commonly known as: PROGRAF Take  1-1.5 mg by mouth See admin instructions. Take 1.5 mg in the morning and 1 mg in the evening   Vitamin D 50 MCG (2000 UT) tablet Take 2,000 Units by mouth daily.            Durable Medical Equipment  (From admission, onward)         Start     Ordered   04/18/21 1425  For home use only DME Walker  Once       Question:  Patient needs a walker to treat with the following condition  Answer:  Weakness   04/18/21 1424          Follow-up Information    Albina Billet, MD. Schedule an appointment as soon as possible for a visit in 1 week(s).    Specialty: Internal Medicine Contact information: [address]   [place] [address] [phone]        Nelva Bush, MD Follow up.   Specialty: Cardiology Why: You can follow-up with Dr. Saunders Revel at [facility] cardiology if you would like.  Or any of the other providers there.  Or if preferred please speak with your PCP regarding a cardiology referral. Contact information: [address] [place] [address] [phone]              Allergies  Allergen Reactions  . Latex Itching  . Voltaren [Diclofenac Sodium] Diarrhea and Nausea And Vomiting  . Oxycodone-Acetaminophen Nausea And Vomiting  . Penicillins Rash    Has patient had a PCN reaction causing immediate rash, facial/tongue/throat swelling, SOB or lightheadedness with hypotension: Yes Has patient had a PCN reaction causing severe rash involving mucus membranes or skin necrosis: No Has patient had a PCN reaction that required hospitalization: No Has patient had a PCN reaction occurring within the last 10 years: No If all of the above answers are "NO", then may proceed with Cephalosporin use.     Consultations:  Neurosurgery   Procedures/Studies: CT HEAD WO CONTRAST  Result Date: 04/18/2021 CLINICAL DATA:  Inpatient. New headache this morning. Recent falls. Left subdural hematoma. EXAM: CT HEAD WITHOUT CONTRAST TECHNIQUE: Contiguous axial images were obtained from the base of the skull through the vertex without intravenous contrast. COMPARISON:  04/17/2021 head CT. FINDINGS: Brain: Left frontal cerebral convexity subdural hematoma with fairly low attenuation, maximum thickness 7 mm, previously 7 mm using similar measurement technique, stable. No evidence of parenchymal hemorrhage or new extra-axial fluid collection. Stable mild left cerebral sulcal effacement. Mild 3 mm right midline shift, previously 3 mm, stable. No mass lesion. No CT evidence of acute infarction. Nonspecific mild to moderate  subcortical and periventricular white matter hypodensity, most in keeping with chronic small vessel ischemic change. Cerebral volume is age appropriate. Stable mass-effect on the left lateral ventricle. Mild dilatation of the temporal horn of the right lateral ventricle is unchanged. Vascular: No acute abnormality. Skull: No evidence of calvarial fracture. Sinuses/Orbits: No fluid levels. Mild mucoperiosteal thickening throughout the ethmoidal air cells bilaterally. Other:  The mastoid air cells are unopacified. IMPRESSION: 1. Stable left frontal cerebral convexity subdural hematoma with maximum thickness 7 mm. Stable mild left cerebral sulcal effacement and 3 mm right midline shift. 2. Stable mild dilatation of the temporal horn of the right lateral ventricle. 3. Mild-to-moderate chronic small vessel ischemic changes in the cerebral white matter. 4. Mild paranasal sinusitis, chronic appearing. Electronically Signed   By: Ilona Sorrel M.D.   On: 04/18/2021 09:20   CT  HEAD WO CONTRAST  Result Date: 04/17/2021 CLINICAL DATA:  Follow-up subdural hematoma EXAM: CT HEAD WITHOUT CONTRAST TECHNIQUE: Contiguous axial images were obtained from the base of the skull through the vertex without intravenous contrast. COMPARISON:  Yesterday FINDINGS: Brain: The subdural hematoma along the left cerebral convexity with primarily low-density is non progressed. Maximal thickness is 6 mm and midline shift measures 3 mm. No cortical infarct, hydrocephalus, or masslike finding. Vascular: No hyperdense vessel or unexpected calcification. Skull: Normal. Negative for fracture or focal lesion. Sinuses/Orbits: Retention cyst in the inferior right maxillary sinus. Bilateral cataract resection. IMPRESSION: Unchanged nonacute subdural hematoma around the left cerebral convexity, 6 mm in maximal thickness. Electronically Signed   By: Monte Fantasia M.D.   On: 04/17/2021 05:19   CT Head Wo Contrast  Result Date: 04/16/2021 CLINICAL DATA:   Unwitnessed fall 1 week ago.  Increasing confusion EXAM: CT HEAD WITHOUT CONTRAST CT CERVICAL SPINE WITHOUT CONTRAST TECHNIQUE: Multidetector CT imaging of the head and cervical spine was performed following the standard protocol without intravenous contrast. Multiplanar CT image reconstructions of the cervical spine were also generated. COMPARISON:  None. FINDINGS: CT HEAD FINDINGS Brain: Subacute appearing subdural collection overlying the left cerebral convexity measuring up to 6-7 mm in thickness (series 3, image 13). 4 mm left to right midline shift. No evidence of intraparenchymal hemorrhage. No hydrocephalus. No mass lesion identified. Mild-moderate low-density changes within the periventricular and subcortical white matter compatible with chronic microvascular ischemic change. Mild diffuse cerebral volume loss. Vascular: Atherosclerotic calcifications involving the large vessels of the skull base. No unexpected hyperdense vessel. Skull: Normal. Negative for fracture or focal lesion. Sinuses/Orbits: Mild bilateral ethmoid mucosal thickening. Other: Negative for scalp hematoma. CT CERVICAL SPINE FINDINGS Alignment: Facet joints are aligned without dislocation or traumatic listhesis. Dens and lateral masses are aligned. Skull base and vertebrae: No acute fracture. No primary bone lesion or focal pathologic process. Soft tissues and spinal canal: No prevertebral fluid or swelling. No visible canal hematoma. Disc levels: Multilevel degenerative disc disease within the cervical spine most pronounced from C4-5 through C7-T1. Mild multilevel facet arthropathy. No evidence of high-grade canal stenosis by CT. Upper chest: Included lung apices are clear. Other: None. IMPRESSION: 1. Subacute appearing subdural hematoma overlying the left cerebral convexity measuring up to 6-7 mm in thickness with associated 4 mm left to right midline shift. 2. Chronic microvascular ischemic changes and cerebral volume loss. 3. No  evidence of acute fracture or traumatic listhesis of the cervical spine. 4. Multilevel degenerative disc disease and facet arthropathy of the cervical spine. Critical Value/emergent results were called by telephone at the time of interpretation on 04/16/2021 at 5:28 pm to provider [facility] , who verbally acknowledged these results. Electronically Signed   By: Davina Poke D.O.   On: 04/16/2021 17:30   CT Cervical Spine Wo Contrast  Result Date: 04/16/2021 CLINICAL DATA:  Unwitnessed fall 1 week ago.  Increasing confusion EXAM: CT HEAD WITHOUT CONTRAST CT CERVICAL SPINE WITHOUT CONTRAST TECHNIQUE: Multidetector CT imaging of the head and cervical spine was performed following the standard protocol without intravenous contrast. Multiplanar CT image reconstructions of the cervical spine were also generated. COMPARISON:  None. FINDINGS: CT HEAD FINDINGS Brain: Subacute appearing subdural collection overlying the left cerebral convexity measuring up to 6-7 mm in thickness (series 3, image 13). 4 mm left to right midline shift. No evidence of intraparenchymal hemorrhage. No hydrocephalus. No mass lesion identified. Mild-moderate low-density changes within the periventricular and subcortical white matter compatible  with chronic microvascular ischemic change. Mild diffuse cerebral volume loss. Vascular: Atherosclerotic calcifications involving the large vessels of the skull base. No unexpected hyperdense vessel. Skull: Normal. Negative for fracture or focal lesion. Sinuses/Orbits: Mild bilateral ethmoid mucosal thickening. Other: Negative for scalp hematoma. CT CERVICAL SPINE FINDINGS Alignment: Facet joints are aligned without dislocation or traumatic listhesis. Dens and lateral masses are aligned. Skull base and vertebrae: No acute fracture. No primary bone lesion or focal pathologic process. Soft tissues and spinal canal: No prevertebral fluid or swelling. No visible canal hematoma. Disc levels: Multilevel  degenerative disc disease within the cervical spine most pronounced from C4-5 through C7-T1. Mild multilevel facet arthropathy. No evidence of high-grade canal stenosis by CT. Upper chest: Included lung apices are clear. Other: None. IMPRESSION: 1. Subacute appearing subdural hematoma overlying the left cerebral convexity measuring up to 6-7 mm in thickness with associated 4 mm left to right midline shift. 2. Chronic microvascular ischemic changes and cerebral volume loss. 3. No evidence of acute fracture or traumatic listhesis of the cervical spine. 4. Multilevel degenerative disc disease and facet arthropathy of the cervical spine. Critical Value/emergent results were called by telephone at the time of interpretation on 04/16/2021 at 5:28 pm to provider [facility] , who verbally acknowledged these results. Electronically Signed   By: Davina Poke D.O.   On: 04/16/2021 17:30   US Venous Img Lower Bilateral  Result Date: 04/16/2021 CLINICAL DATA:  Swelling EXAM: BILATERAL LOWER EXTREMITY VENOUS DOPPLER ULTRASOUND TECHNIQUE: Gray-scale sonography with compression, as well as color and duplex ultrasound, were performed to evaluate the deep venous system(s) from the level of the common femoral vein through the popliteal and proximal calf veins. COMPARISON:  None. FINDINGS: VENOUS Normal compressibility of the common femoral, superficial femoral, and popliteal veins, as well as the visualized calf veins. Visualized portions of profunda femoral vein and great saphenous vein unremarkable. No filling defects to suggest DVT on grayscale or color Doppler imaging. Doppler waveforms show normal direction of venous flow, normal respiratory plasticity and response to augmentation. Limited views of the contralateral common femoral vein are unremarkable. OTHER None. Limitations: none IMPRESSION: Negative. Electronically Signed   By: Dorise Bullion III M.D   On: 04/16/2021 17:58   CT T-SPINE NO CHARGE  Result Date:  04/16/2021 CLINICAL DATA:  Fall 1 week ago with back pain EXAM: CT THORACIC AND LUMBAR SPINE WITHOUT CONTRAST TECHNIQUE: Multidetector CT imaging of the thoracic and lumbar spine was performed without contrast. Multiplanar CT image reconstructions were also generated. COMPARISON:  None. FINDINGS: CT THORACIC SPINE FINDINGS Alignment: Mild dextroconvex curvature of the thoracic spine. No evidence of traumatic listhesis. Vertebrae: Anterior superior compression deformity of the T12 vertebral body with approximately 25% height loss. Paraspinal and other soft tissues: Negative. Disc levels: Multilevel degenerative change of the thoracic spine without significant neural foraminal or canal narrowing. CT LUMBAR SPINE FINDINGS Segmentation: 5 lumbar type vertebrae. Alignment: No evidence of traumatic listhesis. Vertebrae: Age-indeterminate anterior superior compression deformity at L1 with approximately 40% height loss. Paraspinal and other soft tissues: Negative. Disc levels: Multilevel degenerative changes spine most significant at L2-L3 and L3-L4 with at least moderate neural foraminal narrowing at L3-L4 on the right as well as L2-L3 on the left. No significant canal narrowing. IMPRESSION: CT THORACIC SPINE IMPRESSION Age-indeterminate anterior superior compression deformity at T12 vertebral body with approximately 25% height loss. Multilevel degenerative change of the spine without significant neural foraminal or canal narrowing. CT LUMBAR SPINE IMPRESSION Age-indeterminate anterior superior compression deformity  at L1 with approximately 40% height loss. Multilevel degenerative changes of the thoracic and lumbar spine, most significant at L2-L3 and L3-L4 with at least moderate neural foraminal narrowing at L3-L4 on the right as well as L2-L3 on the left. Electronically Signed   By: Dahlia Bailiff MD   On: 04/16/2021 17:41   CT L-SPINE NO CHARGE  Result Date: 04/16/2021 CLINICAL DATA:  Fall 1 week ago with back pain  EXAM: CT THORACIC AND LUMBAR SPINE WITHOUT CONTRAST TECHNIQUE: Multidetector CT imaging of the thoracic and lumbar spine was performed without contrast. Multiplanar CT image reconstructions were also generated. COMPARISON:  None. FINDINGS: CT THORACIC SPINE FINDINGS Alignment: Mild dextroconvex curvature of the thoracic spine. No evidence of traumatic listhesis. Vertebrae: Anterior superior compression deformity of the T12 vertebral body with approximately 25% height loss. Paraspinal and other soft tissues: Negative. Disc levels: Multilevel degenerative change of the thoracic spine without significant neural foraminal or canal narrowing. CT LUMBAR SPINE FINDINGS Segmentation: 5 lumbar type vertebrae. Alignment: No evidence of traumatic listhesis. Vertebrae: Age-indeterminate anterior superior compression deformity at L1 with approximately 40% height loss. Paraspinal and other soft tissues: Negative. Disc levels: Multilevel degenerative changes spine most significant at L2-L3 and L3-L4 with at least moderate neural foraminal narrowing at L3-L4 on the right as well as L2-L3 on the left. No significant canal narrowing. IMPRESSION: CT THORACIC SPINE IMPRESSION Age-indeterminate anterior superior compression deformity at T12 vertebral body with approximately 25% height loss. Multilevel degenerative change of the spine without significant neural foraminal or canal narrowing. CT LUMBAR SPINE IMPRESSION Age-indeterminate anterior superior compression deformity at L1 with approximately 40% height loss. Multilevel degenerative changes of the thoracic and lumbar spine, most significant at L2-L3 and L3-L4 with at least moderate neural foraminal narrowing at L3-L4 on the right as well as L2-L3 on the left. Electronically Signed   By: Dahlia Bailiff MD   On: 04/16/2021 17:41   VAS [place] ABI WITH/WO TBI  Result Date: 03/24/2021 LOWER EXTREMITY DOPPLER STUDY Indications: Claudication, and peripheral artery disease.  Vascular  Interventions: 10/16/2018:PTA and Stent placement in the Rt SFA and                         Popliteal Artery. Comparison Study: 02/02/2021 Performing Technologist: Charlane Ferretti RT (R)(VS)  Examination Guidelines: A complete evaluation includes at minimum, Doppler waveform signals and systolic blood pressure reading at the level of bilateral brachial, anterior tibial, and posterior tibial arteries, when vessel segments are accessible. Bilateral testing is considered an integral part of a complete examination. Photoelectric Plethysmograph (PPG) waveforms and toe systolic pressure readings are included as required and additional duplex testing as needed. Limited examinations for reoccurring indications may be performed as noted.  ABI Findings: +---------+------------------+-----+--------+----------+ Right    Rt Pressure (mmHg)IndexWaveformComment    +---------+------------------+-----+--------+----------+ Brachial                                Lymphedema +---------+------------------+-----+--------+----------+ ATA      172               1.42 biphasic           +---------+------------------+-----+--------+----------+ PTA      139               1.15 biphasic           +---------+------------------+-----+--------+----------+ [place]  2.05                    +---------+------------------+-----+--------+----------+ +---------+------------------+-----+---------+----------------+ Left     Lt Pressure (mmHg)IndexWaveform Comment          +---------+------------------+-----+---------+----------------+ Brachial 121                                              +---------+------------------+-----+---------+----------------+ ATA                             triphasicNon compressable +---------+------------------+-----+---------+----------------+ PTA                             triphasicNon compressable  +---------+------------------+-----+---------+----------------+ Great Toe130               1.07                           +---------+------------------+-----+---------+----------------+ +-------+-----------+-----------+------------+------------+ ABI/TBIToday's ABIToday's TBIPrevious ABIPrevious TBI +-------+-----------+-----------+------------+------------+ Right  [place]         2.05       [place]                       +-------+-----------+-----------+------------+------------+ Left   [place]         1.07       [place]          [place]           +-------+-----------+-----------+------------+------------+ Bilateral ABIs appear essentially unchanged compared to prior study on 02/02/2021. Left TBIs appear essentially unchanged compared to prior study on 02/02/2021.  Summary: Right: Resting right ankle-brachial index indicates noncompressible right lower extremity arteries. The right toe-brachial index is normal. Left: Resting left ankle-brachial index indicates noncompressible left lower extremity arteries. The left toe-brachial index is normal. *See table(s) above for measurements and observations.  Electronically signed by Hortencia Pilar MD on 03/24/2021 at 4:28:32 PM.   Final    CT CHEST ABDOMEN PELVIS WO CONTRAST  Result Date: 04/16/2021 CLINICAL DATA:  Fall 1 week ago with back pain and right leg swelling. EXAM: CT CHEST, ABDOMEN AND PELVIS WITHOUT CONTRAST TECHNIQUE: Multidetector CT imaging of the chest, abdomen and pelvis was performed following the standard protocol without IV contrast. COMPARISON:  None. FINDINGS: CT CHEST FINDINGS Cardiovascular: Aortic atherosclerosis. No thoracic aortic aneurysm. Three vessel dense coronary artery calcifications. Calcifications of the mitral annulus and aortic valve. Normal size heart. Trace pericardial fluid, likely physiologic. Mediastinum/Nodes: No discrete thyroid nodularity. No pathologically enlarged mediastinal or axillary lymph nodes. The trachea and esophagus are  grossly unremarkable. Small hiatal hernia. Lungs/Pleura: Right middle lobe and lingular atelectasis. No evidence of pulmonary contusion or hemorrhage. No focal consolidation. No pleural effusion. No pneumothorax. Musculoskeletal: Nondisplaced left anterior fourth and fifth rib fractures. CT ABDOMEN PELVIS FINDINGS Hepatobiliary: Unremarkable noncontrast appearance of the hepatic parenchyma. Gallbladder is unremarkable. No biliary ductal dilation. Pancreas: Fatty pancreatic atrophy. Spleen: No splenic injury or perisplenic hematoma. Adrenals/Urinary Tract: Left adrenal thickening. Right adrenal glands unremarkable. Native bilateral kidneys are atrophic with calcifications without hydronephrosis. Right lower quadrant transplant kidney. No transplant kidney hydronephrosis. 3 mm nonobstructive stone in the upper pole of the transplant kidney. Urinary bladder is grossly unremarkable for degree of distension. Stomach/Bowel: Small hiatal hernia otherwise the stomach is grossly  unremarkable. No suspicious small bowel dilation. No no evidence of acute of colonic inflammation or obstruction. Vascular/Lymphatic: Aortic atherosclerosis. No abdominal aortic aneurysm. Vascular stents versus dense calcifications in the bilateral common iliac arteries. No enlarged abdominal or pelvic lymph nodes. Reproductive: Uterus and bilateral adnexa are unremarkable. Other: No abdominopelvic ascites. Postsurgical change in the right anterior abdominopelvic wall. Musculoskeletal: Age indeterminate anterior superior endplate compression deformities at T12 and L1 with approximately 25% height loss at T12 and 40% height loss at L1. No evidence of traumatic listhesis. Multilevel degenerative change of the spine most significant at L2-L3 and L3-L4. IMPRESSION: 1. Nondisplaced left anterior fourth and fifth rib fractures. No pneumothorax. 2. Age indeterminate anterior superior endplate compression deformities at T12 and L1 with approximately 25%  height loss at T12 and 40% height loss at L1. 3. Right lower quadrant transplant kidney without hydronephrosis. 3 mm nonobstructive stone in the upper pole of the transplant kidney. 4. Small hiatal hernia. 5. Aortic atherosclerosis. Aortic Atherosclerosis (ICD10-I70.0). Electronically Signed   By: Dahlia Bailiff MD   On: 04/16/2021 17:42    (Echo, Carotid, EGD, Colonoscopy, ERCP)    Subjective: Seen and examined on the day of discharge.  No distress.  Feels well.  Stable for discharge home.  Follow-up outpatient PCP, cardiology, neurosurgery  Discharge Exam: Vitals:   04/19/21 1100 04/19/21 1200  BP:  130/72  Pulse: 78   Resp: 19 16  Temp:    SpO2: 97% 98%   Vitals:   04/19/21 0937 04/19/21 1000 04/19/21 1100 04/19/21 1200  BP: (!) 155/60 138/84  130/72  Pulse:  86 78   Resp:  17 19 16   Temp:      TempSrc:      SpO2:  94% 97% 98%  Weight:      Height:        General: Pt is alert, awake, not in acute distress Cardiovascular: RRR, S1/S2 +, no rubs, no gallops Respiratory: CTA bilaterally, no wheezing, no rhonchi Abdominal: Soft, NT, ND, bowel sounds + Extremities: no edema, no cyanosis    The results of significant diagnostics from this hospitalization (including imaging, microbiology, ancillary and laboratory) are listed below for reference.     Microbiology: Recent Results (from the past 240 hour(s))  Resp Panel by RT-PCR (Flu A&B, Covid) Nasopharyngeal Swab     Status: None   Collection Time: 04/16/21  3:41 PM   Specimen: Nasopharyngeal Swab; Nasopharyngeal(NP) swabs in vial transport medium  Result Value Ref Range Status   SARS Coronavirus 2 by RT PCR NEGATIVE NEGATIVE Final    Comment: (NOTE) SARS-CoV-2 target nucleic acids are NOT DETECTED.  The SARS-CoV-2 RNA is generally detectable in upper respiratory specimens during the acute phase of infection. The lowest concentration of SARS-CoV-2 viral copies this assay can detect is 138 copies/mL. A negative result  does not preclude SARS-Cov-2 infection and should not be used as the sole basis for treatment or other patient management decisions. A negative result may occur with  improper specimen collection/handling, submission of specimen other than nasopharyngeal swab, presence of viral mutation(s) within the areas targeted by this assay, and inadequate number of viral copies(<138 copies/mL). A negative result must be combined with clinical observations, patient history, and epidemiological information. The expected result is Negative.  Fact Sheet for Patients:  EntrepreneurPulse.com.au  Fact Sheet for Healthcare Providers:  IncredibleEmployment.be  This test is no t yet approved or cleared by the [place] FDA and  has been authorized for detection and/or diagnosis of  SARS-CoV-2 by FDA under an Emergency Use Authorization (EUA). This EUA will remain  in effect (meaning this test can be used) for the duration of the COVID-19 declaration under Section 564(b)(1) of the Act, 21 U.S.C.section 360bbb-3(b)(1), unless the authorization is terminated  or revoked sooner.       Influenza A by PCR NEGATIVE NEGATIVE Final   Influenza B by PCR NEGATIVE NEGATIVE Final    Comment: (NOTE) The Xpert Xpress SARS-CoV-2/FLU/RSV plus assay is intended as an aid in the diagnosis of influenza from Nasopharyngeal swab specimens and should not be used as a sole basis for treatment. Nasal washings and aspirates are unacceptable for Xpert Xpress SARS-CoV-2/FLU/RSV testing.  Fact Sheet for Patients: EntrepreneurPulse.com.au  Fact Sheet for Healthcare Providers: IncredibleEmployment.be  This test is not yet approved or cleared by the [place] FDA and has been authorized for detection and/or diagnosis of SARS-CoV-2 by FDA under an Emergency Use Authorization (EUA). This EUA will remain in effect (meaning this test can be used) for  the duration of the COVID-19 declaration under Section 564(b)(1) of the Act, 21 U.S.C. section 360bbb-3(b)(1), unless the authorization is terminated or revoked.  Performed at [facility], [address] [place], [place] [address]   MRSA PCR Screening     Status: None   Collection Time: 04/17/21 12:59 AM   Specimen: Nasal Mucosa; Nasopharyngeal  Result Value Ref Range Status   MRSA by PCR NEGATIVE NEGATIVE Final    Comment:        The GeneXpert MRSA Assay (FDA approved for NASAL specimens only), is one component of a comprehensive MRSA colonization surveillance program. It is not intended to diagnose MRSA infection nor to guide or monitor treatment for MRSA infections. Performed at [facility], [address] [place], [place] [address]      Labs: BNP (last 3 results) No results for input(s): BNP in the last 8760 hours. Basic Metabolic Panel: Recent Labs  Lab 04/16/21 1538 04/17/21 0447 04/18/21 0823 04/19/21 0411  NA 135 133* 133* 137  K 4.7 4.6 3.8 3.9  CL 102 104 100 103  CO2 22 20* 25 25  GLUCOSE 128* 76 102* 93  BUN 25* 20 13 15   CREATININE 0.75 0.63 0.66 0.75  CALCIUM 9.3 8.5* 8.6* 8.7*  MG 1.8  --   --  1.8   Liver Function Tests: Recent Labs  Lab 04/16/21 1538  AST 30  ALT 14  ALKPHOS 67  BILITOT 0.9  PROT 6.2*  ALBUMIN 3.5   No results for input(s): LIPASE, AMYLASE in the last 168 hours. No results for input(s): AMMONIA in the last 168 hours. CBC: Recent Labs  Lab 04/16/21 1538 04/17/21 0447 04/18/21 0823  WBC 13.3* 12.2* 9.6  NEUTROABS 11.6*  --  6.7  HGB 12.8 12.3 12.7  HCT 38.4 35.9* 38.5  MCV 89.1 87.8 90.6  PLT 734* 423* 603*   Cardiac Enzymes: Recent Labs  Lab 04/16/21 1538  CKTOTAL 144   BNP: Invalid input(s): POCBNP CBG: Recent Labs  Lab 04/18/21 2038 04/18/21 2108 04/19/21 0545 04/19/21 0729 04/19/21 1210  GLUCAP 231* 234* 116* 123* 184*   D-Dimer No results for input(s): DDIMER in the  last 72 hours. Hgb A1c Recent Labs    04/16/21 1455  HGBA1C 5.1   Lipid Profile No results for input(s): CHOL, HDL, LDLCALC, TRIG, CHOLHDL, LDLDIRECT in the last 72 hours. Thyroid function studies Recent Labs    04/16/21 1538  TSH 2.040   Anemia work  up No results for input(s): VITAMINB12, FOLATE, FERRITIN, TIBC, IRON, RETICCTPCT in the last 72 hours. Urinalysis    Component Value Date/Time   COLORURINE YELLOW (A) 04/16/2021 1950   APPEARANCEUR CLEAR (A) 04/16/2021 1950   LABSPEC 1.031 (H) 04/16/2021 1950   PHURINE 5.0 04/16/2021 1950   GLUCOSEU NEGATIVE 04/16/2021 1950   HGBUR NEGATIVE 04/16/2021 1950   [place] NEGATIVE 04/16/2021 1950   KETONESUR 80 (A) 04/16/2021 1950   PROTEINUR 30 (A) 04/16/2021 1950   NITRITE NEGATIVE 04/16/2021 1950   LEUKOCYTESUR NEGATIVE 04/16/2021 1950   Sepsis Labs Invalid input(s): PROCALCITONIN,  WBC,  LACTICIDVEN Microbiology Recent Results (from the past 240 hour(s))  Resp Panel by RT-PCR (Flu A&B, Covid) Nasopharyngeal Swab     Status: None   Collection Time: 04/16/21  3:41 PM   Specimen: Nasopharyngeal Swab; Nasopharyngeal(NP) swabs in vial transport medium  Result Value Ref Range Status   SARS Coronavirus 2 by RT PCR NEGATIVE NEGATIVE Final    Comment: (NOTE) SARS-CoV-2 target nucleic acids are NOT DETECTED.  The SARS-CoV-2 RNA is generally detectable in upper respiratory specimens during the acute phase of infection. The lowest concentration of SARS-CoV-2 viral copies this assay can detect is 138 copies/mL. A negative result does not preclude SARS-Cov-2 infection and should not be used as the sole basis for treatment or other patient management decisions. A negative result may occur with  improper specimen collection/handling, submission of specimen other than nasopharyngeal swab, presence of viral mutation(s) within the areas targeted by this assay, and inadequate number of viral copies(<138 copies/mL). A negative result  must be combined with clinical observations, patient history, and epidemiological information. The expected result is Negative.  Fact Sheet for Patients:  EntrepreneurPulse.com.au  Fact Sheet for Healthcare Providers:  IncredibleEmployment.be  This test is no t yet approved or cleared by the [place] FDA and  has been authorized for detection and/or diagnosis of SARS-CoV-2 by FDA under an Emergency Use Authorization (EUA). This EUA will remain  in effect (meaning this test can be used) for the duration of the COVID-19 declaration under Section 564(b)(1) of the Act, 21 U.S.C.section 360bbb-3(b)(1), unless the authorization is terminated  or revoked sooner.       Influenza A by PCR NEGATIVE NEGATIVE Final   Influenza B by PCR NEGATIVE NEGATIVE Final    Comment: (NOTE) The Xpert Xpress SARS-CoV-2/FLU/RSV plus assay is intended as an aid in the diagnosis of influenza from Nasopharyngeal swab specimens and should not be used as a sole basis for treatment. Nasal washings and aspirates are unacceptable for Xpert Xpress SARS-CoV-2/FLU/RSV testing.  Fact Sheet for Patients: EntrepreneurPulse.com.au  Fact Sheet for Healthcare Providers: IncredibleEmployment.be  This test is not yet approved or cleared by the [place] FDA and has been authorized for detection and/or diagnosis of SARS-CoV-2 by FDA under an Emergency Use Authorization (EUA). This EUA will remain in effect (meaning this test can be used) for the duration of the COVID-19 declaration under Section 564(b)(1) of the Act, 21 U.S.C. section 360bbb-3(b)(1), unless the authorization is terminated or revoked.  Performed at [facility], [address] [place], [place] [address]   MRSA PCR Screening     Status: None   Collection Time: 04/17/21 12:59 AM   Specimen: Nasal Mucosa; Nasopharyngeal  Result Value Ref Range Status   MRSA by  PCR NEGATIVE NEGATIVE Final    Comment:        The GeneXpert MRSA Assay (FDA approved for NASAL specimens only), is one component  of a comprehensive MRSA colonization surveillance program. It is not intended to diagnose MRSA infection nor to guide or monitor treatment for MRSA infections. Performed at [facility], [address] [place], [place] [address]      Time coordinating discharge: Over 30 minutes  SIGNED:   Sidney Ace, MD  [facility] 04/19/2021, 2:30 PM Pager   If 7PM-7AM, please contact night-coverage

## 2021-04-19 NOTE — Discharge Instructions (Signed)
Subdural Hematoma  A subdural hematoma is a collection of blood between the brain and its outer covering (dura). As the amount of blood increases, pressure builds on the brain. There are two types of subdural hematomas:  Acute. This type develops shortly after a hard, direct hit to the head and causes blood to collect very quickly. This is a medical emergency. If it is not diagnosed and treated quickly, it can lead to severe brain injury or death.  Chronic. This is when bleeding develops more slowly, over weeks or months. In some cases, this type does not cause symptoms. What are the causes? This condition is caused by bleeding (hemorrhage) from a broken (ruptured) blood vessel. In most cases, a blood vessel ruptures and bleeds because of a head injury, such as from a hard, direct hit. Head injuries can happen in car accidents, falls, assaults, or while playing sports. In rare cases, a hemorrhage can happen without a known cause (spontaneously), especially if you take blood thinners (anticoagulants). What increases the risk? This condition is more likely to develop in:  Older people.  Infants.  People who take blood thinners.  People who have head injuries.  People who abuse alcohol. What are the signs or symptoms? Symptoms of this condition can vary depending on the size of the hematoma. Symptoms can be mild, severe, or life-threatening. They include:  Headaches.  Nausea or vomiting.  Changes in vision, such as double vision or loss of vision.  Changes in speech or trouble understanding what people say.  Loss of balance or trouble walking.  Weakness, numbness, or tingling in the arms or legs, especially on one side of the body.  Seizures.  Change in personality.  Increased sleepiness.  Memory loss.  Loss of consciousness.  Coma. Symptoms of acute subdural hematoma can develop over minutes or hours. Symptoms of chronic subdural hematoma may develop over weeks or  months. How is this diagnosed? This condition is diagnosed based on the results of:  A physical exam.  Tests of strength, reflexes, coordination, senses, manner of walking (gait), and facial and eye movements (neurological exam).  Imaging tests, such as an MRI or a CT scan. How is this treated? Treatment for this condition depends on the type of hematoma and how severe it is. Treatment for acute hematoma may include:  Emergency surgery to drain blood or remove a blood clot.  Medicines that help the body get rid of excess fluids (diuretics). These may help to reduce pressure in the brain.  Assisted breathing (ventilation). Treatment for chronic hematoma may include:  Observation and bed rest at the hospital.  Surgery. If you take blood thinners, you may need to stop taking them for a short time. You may also be given anti-seizure (anticonvulsant) medicine. Sometimes, no treatment is needed for chronic subdural hematoma. Follow these instructions at home: Activity  Avoid situations where you could injure your head again, such as in competitive sports, downhill snow sports, and horseback riding. Do not do these activities until your health care provider approves. ? Wear protective gear, such as a helmet, when participating in activities such as biking or contact sports.  Avoid too much visual stimulation while recovering. This means limiting how much you read and limiting your screen time on a smart phone, tablet, computer, or TV.  Rest as told by your health care provider. Rest helps the brain heal.  Try to avoid activities that cause physical or mental stress. Return to work or school as told by  your health care provider.  Do not lift anything that is heavier than 5 lb (2.3 kg), or the limit you are told, until your health care provider says that it is safe.  Do not drive, ride a bike, or use heavy machinery until your health care provider approves.  Always wear your seat belt  when you are in a motor vehicle. Alcohol use  Do not drink alcohol if your health care provider tells you not to drink.  If you drink alcohol, limit how much you use to: ? 0-1 drink a day for women. ? 0-2 drinks a day for men. General instructions  Monitor your symptoms, and ask people around you to do the same. Recovery from brain injuries varies. Talk with your health care provider about what to expect.  Take over-the-counter and prescription medicines only as told by your health care provider. Do not take blood thinners or NSAIDs unless your health care provider approves. These include aspirin, ibuprofen, naproxen, and warfarin.  Keep your home environment safe to reduce the risk of falling.  Keep all follow-up visits as told by your health care provider. This is important. Where to find more information  National Institute of Neurological Disorders and Stroke: www.ninds.nih.gov  American Academy of Neurology (AAN): www.aan.com  Brain Injury Association of America: www.biausa.org Get help right away if you:  Are taking blood thinners and you fall or you experience minor trauma to the head. If you take any blood thinners, even a very small injury can cause a subdural hematoma.  Have a bleeding disorder and you fall or you experience minor trauma to the head.  Develop any of the following symptoms after a head injury: ? Clear fluid draining from your nose or ears. ? Nausea or vomiting. ? Changes in speech or trouble understanding what people say. ? Seizures. ? Drowsiness or a decrease in alertness. ? Double vision. ? Numbness or inability to move (paralysis) in any part of your body. ? Difficulty walking or poor coordination. ? Difficulty thinking. ? Confusion or forgetfulness. ? Personality changes. ? Irrational or aggressive behavior. These symptoms may represent a serious problem that is an emergency. Do not wait to see if the symptoms will go away. Get medical help  right away. Call your local emergency services (911 in the [place] Do not drive yourself to the hospital. Summary  A subdural hematoma is a collection of blood between the brain and its outer covering (dura).  Treatment for this condition depends on what type of subdural hematoma you have and how severe it is.  Symptoms can vary from mild to severe to life-threatening.  Monitor your symptoms, and ask others around you to do the same. This information is not intended to replace advice given to you by your health care provider. Make sure you discuss any questions you have with your health care provider. Document Revised: 11/11/2018 Document Reviewed: 11/11/2018 Elsevier Patient Education  2021 Elsevier Inc.  

## 2021-04-20 NOTE — TOC Progression Note (Signed)
Transition of Care [facility]) - Progression Note    Patient Details  Name: Cindy Fischer MRN: [id number] Date of Birth: 1945/12/26  Transition of Care [facility]) CM/SW [place], [place] Phone Number: [phone] 04/20/2021, 3:12 PM  Clinical Narrative:     Patient initially did not want home health services.  She has now decided she will benefit form home services.  [place] will see the patient in the next 48 hours.       Expected Discharge Plan and Services           Expected Discharge Date: 04/19/21                                     Social Determinants of Health (SDOH) Interventions    Readmission Risk Interventions No flowsheet data found.

## 2021-04-21 ENCOUNTER — Other Ambulatory Visit: Payer: Self-pay | Admitting: Neurosurgery

## 2021-04-21 DIAGNOSIS — S065X9A Traumatic subdural hemorrhage with loss of consciousness of unspecified duration, initial encounter: Secondary | ICD-10-CM

## 2021-04-21 DIAGNOSIS — S065XAA Traumatic subdural hemorrhage with loss of consciousness status unknown, initial encounter: Secondary | ICD-10-CM

## 2021-04-25 ENCOUNTER — Other Ambulatory Visit: Admit: 2021-04-25 | Discharge: 2021-04-26 | Payer: MEDICARE

## 2021-04-25 ENCOUNTER — Other Ambulatory Visit: Payer: Self-pay

## 2021-04-25 ENCOUNTER — Other Ambulatory Visit
Admission: RE | Admit: 2021-04-25 | Discharge: 2021-04-25 | Disposition: A | Discharge status: Home / Self Care | Payer: Medicare Other | Attending: Nephrology | Admitting: Nephrology

## 2021-04-25 DIAGNOSIS — Z789 Other specified health status: Secondary | ICD-10-CM | POA: Diagnosis not present

## 2021-04-25 DIAGNOSIS — D899 Disorder involving the immune mechanism, unspecified: Secondary | ICD-10-CM | POA: Diagnosis not present

## 2021-04-25 DIAGNOSIS — Z79899 Other long term (current) drug therapy: Secondary | ICD-10-CM | POA: Diagnosis not present

## 2021-04-25 DIAGNOSIS — B259 Cytomegaloviral disease, unspecified: Secondary | ICD-10-CM | POA: Diagnosis not present

## 2021-04-25 DIAGNOSIS — E1129 Type 2 diabetes mellitus with other diabetic kidney complication: Secondary | ICD-10-CM | POA: Diagnosis not present

## 2021-04-25 DIAGNOSIS — Z9483 Pancreas transplant status: Secondary | ICD-10-CM | POA: Insufficient documentation

## 2021-04-25 DIAGNOSIS — Z09 Encounter for follow-up examination after completed treatment for conditions other than malignant neoplasm: Secondary | ICD-10-CM | POA: Diagnosis present

## 2021-04-25 DIAGNOSIS — N390 Urinary tract infection, site not specified: Secondary | ICD-10-CM | POA: Insufficient documentation

## 2021-04-25 DIAGNOSIS — Z940 Kidney transplant status: Secondary | ICD-10-CM | POA: Diagnosis not present

## 2021-04-25 DIAGNOSIS — D631 Anemia in chronic kidney disease: Secondary | ICD-10-CM | POA: Insufficient documentation

## 2021-04-25 LAB — ECHOCARDIOGRAM COMPLETE
AR max vel: 1.89 cm2
AV Peak grad: 7.4 mmHg
Ao pk vel: 1.36 m/s
Area-P 1/2: 5.62 cm2
Height: 62 in
S' Lateral: 2.96 cm
Weight: 2479.73 oz

## 2021-04-25 LAB — CBC WITH DIFFERENTIAL/PLATELET
Abs Immature Granulocytes: 0.09 10*3/uL — ABNORMAL HIGH (ref 0.00–0.07)
Basophils Absolute: 0 10*3/uL (ref 0.0–0.1)
Basophils Relative: 0 %
Eosinophils Absolute: 0.1 10*3/uL (ref 0.0–0.5)
Eosinophils Relative: 1 %
HCT: 39.5 % (ref 36.0–46.0)
Hemoglobin: 12.9 g/dL (ref 12.0–15.0)
Immature Granulocytes: 1 %
Lymphocytes Relative: 22 %
Lymphs Abs: 1.7 10*3/uL (ref 0.7–4.0)
MCH: 29.8 pg (ref 26.0–34.0)
MCHC: 32.7 g/dL (ref 30.0–36.0)
MCV: 91.2 fL (ref 80.0–100.0)
Monocytes Absolute: 1.1 10*3/uL — ABNORMAL HIGH (ref 0.1–1.0)
Monocytes Relative: 15 %
Neutro Abs: 4.6 10*3/uL (ref 1.7–7.7)
Neutrophils Relative %: 61 %
Platelets: 611 10*3/uL — ABNORMAL HIGH (ref 150–400)
RBC: 4.33 MIL/uL (ref 3.87–5.11)
RDW: 15.2 % (ref 11.5–15.5)
WBC: 7.6 10*3/uL (ref 4.0–10.5)
nRBC: 0 % (ref 0.0–0.2)

## 2021-04-25 LAB — BASIC METABOLIC PANEL
Anion gap: 6 (ref 5–15)
BUN: 21 mg/dL (ref 8–23)
CO2: 28 mmol/L (ref 22–32)
Calcium: 9.3 mg/dL (ref 8.9–10.3)
Chloride: 100 mmol/L (ref 98–111)
Creatinine, Ser: 0.86 mg/dL (ref 0.44–1.00)
GFR, Estimated: >60 mL/min (ref >60)
Glucose, Bld: 118 mg/dL — ABNORMAL HIGH (ref 70–99)
Potassium: 5.2 mmol/L — ABNORMAL HIGH (ref 3.5–5.1)
Sodium: 134 mmol/L — ABNORMAL LOW (ref 135–145)

## 2021-04-25 LAB — PHOSPHORUS: Phosphorus: 3 mg/dL (ref 2.5–4.6)

## 2021-04-25 LAB — MAGNESIUM: Magnesium: 1.9 mg/dL (ref 1.7–2.4)

## 2021-05-04 ENCOUNTER — Ambulatory Visit: Payer: Medicare Other | Admitting: Podiatry

## 2021-05-06 ENCOUNTER — Other Ambulatory Visit: Payer: Self-pay

## 2021-05-06 ENCOUNTER — Ambulatory Visit
Admission: RE | Admit: 2021-05-06 | Discharge: 2021-05-06 | Disposition: A | Discharge status: Home / Self Care | Payer: Medicare Other | Source: Ambulatory Visit | Attending: Neurosurgery | Admitting: Neurosurgery

## 2021-05-06 DIAGNOSIS — S065X9A Traumatic subdural hemorrhage with loss of consciousness of unspecified duration, initial encounter: Secondary | ICD-10-CM | POA: Diagnosis present

## 2021-05-06 DIAGNOSIS — S065XAA Traumatic subdural hemorrhage with loss of consciousness status unknown, initial encounter: Secondary | ICD-10-CM

## 2021-05-09 ENCOUNTER — Emergency Department
Admit: 2021-05-09 | Discharge: 2021-05-10 | Disposition: A | Discharge status: Home / Self Care | Payer: MEDICARE | Attending: Emergency Medicine

## 2021-05-09 ENCOUNTER — Ambulatory Visit
Admit: 2021-05-09 | Discharge: 2021-05-10 | Disposition: A | Discharge status: Home / Self Care | Payer: MEDICARE | Attending: Emergency Medicine

## 2021-05-09 ENCOUNTER — Encounter: Payer: Self-pay | Admitting: Podiatry

## 2021-05-09 ENCOUNTER — Other Ambulatory Visit: Payer: Self-pay

## 2021-05-09 ENCOUNTER — Ambulatory Visit (INDEPENDENT_AMBULATORY_CARE_PROVIDER_SITE_OTHER): Payer: Medicare Other | Admitting: Podiatry

## 2021-05-09 DIAGNOSIS — S0990XA Unspecified injury of head, initial encounter: Principal | ICD-10-CM

## 2021-05-09 DIAGNOSIS — W19XXXA Unspecified fall, initial encounter: Principal | ICD-10-CM

## 2021-05-09 DIAGNOSIS — E162 Hypoglycemia, unspecified: Principal | ICD-10-CM

## 2021-05-09 DIAGNOSIS — E1159 Type 2 diabetes mellitus with other circulatory complications: Secondary | ICD-10-CM

## 2021-05-09 DIAGNOSIS — Z794 Long term (current) use of insulin: Secondary | ICD-10-CM

## 2021-05-09 DIAGNOSIS — L97511 Non-pressure chronic ulcer of other part of right foot limited to breakdown of skin: Secondary | ICD-10-CM | POA: Diagnosis not present

## 2021-05-09 DIAGNOSIS — I739 Peripheral vascular disease, unspecified: Secondary | ICD-10-CM

## 2021-05-09 MED ORDER — CALCIUM CARBONATE 500 MG-VITAMIN D3 5 MCG (200 UNIT) TABLET
ORAL_TABLET | Freq: Two times a day (BID) | ORAL | 0 refills | 20 days | Status: CP
Start: 2021-05-09 — End: 2021-05-29

## 2021-05-09 MED ORDER — MAGNESIUM OXIDE 400 MG (241.3 MG MAGNESIUM) TABLET
ORAL_TABLET | Freq: Every day | ORAL | 0 refills | 20 days | Status: CP
Start: 2021-05-09 — End: 2021-05-29

## 2021-05-09 NOTE — Progress Notes (Signed)
  Subjective:  Patient ID: Cindy Fischer, female    DOB: 02/24/1946,  MRN: [id number]  Chief Complaint  Patient presents with  . Wound Check    "its getting a little better.  I had some pain in the smaller toe with the last dressing change"    75 y.o. female returns with the above complaint. History confirmed with patient.  Doing well thinks is improving  Objective:  Physical Exam: blanching with elevation noted, dependent rubor present, DP absent right, ulcerations present at right hallux, fourth and fifth toes and lateral right heel, normal sensory exam and PT absent right.  Foot is much warmer than previous, improving significantly, erythema is gone and ulcer seem to be healing  Fiber granular ulcerations measuring as follows: Left dorsal hallux 2.2 cm x 1.5 cm x 0.2 cm Left anterior hallux 3 cm x 1.0 cm x 0.2 cm Fourth toe 0.8 cm x 0.2 cm x 0.2 cm Lateral heel 1.1 cm x 0.3 cm x 0.3 cm  Angiography reviewed: She has good two-vessel run off to and through the foot via the AT and PT                Assessment:   1. Type 2 diabetes mellitus with other circulatory complication, with long-term current use of insulin ([place])   2. 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 Peripheral arterial disease ([place])      Plan:  Patient was evaluated and treated and all questions answered.  Ulcer right foot -Hopeful these will now heal she has much better perfusion to the foot -Continue using Santyl I reviewed the use and instructions on this. -Continue off-loading with surgical shoe  -I feel we should continue local wound care for now with enzymatic debridement -Was able to debride wounds today     Procedure: Excisional Debridement of Wound Rationale: Removal of non-viable soft tissue from the wound to promote healing.  Anesthesia: none  Post-Debridement Wound Measurements: Left dorsal hallux 2.2 cm x 1.5 cm x 0.2 cm Left anterior hallux  3 cm x 1.0 cm x 0.2 cm Fourth toe 0.8 cm x 0.2 cm x 0.2 cm Lateral heel 1.1 cm x 0.3 cm x 0.3 cm Type of Debridement: Sharp Excisional Tissue Removed: Non-viable soft tissue Depth of Debridement: subcutaneous tissue. Technique: Sharp excisional debridement to bleeding, viable wound base.  Dressing: Dry, sterile, compression dressing. Disposition: Patient tolerated procedure well.   Return in about 3 weeks (around 05/30/2021) for wound care.           Return in about 3 weeks (around 05/30/2021) for wound care.

## 2021-05-12 ENCOUNTER — Ambulatory Visit: Payer: Medicare Other | Admitting: Internal Medicine

## 2021-05-13 ENCOUNTER — Encounter: Payer: Self-pay | Admitting: Internal Medicine

## 2021-05-13 ENCOUNTER — Ambulatory Visit (INDEPENDENT_AMBULATORY_CARE_PROVIDER_SITE_OTHER): Payer: Medicare Other | Admitting: Internal Medicine

## 2021-05-13 ENCOUNTER — Ambulatory Visit (INDEPENDENT_AMBULATORY_CARE_PROVIDER_SITE_OTHER): Payer: Medicare Other

## 2021-05-13 ENCOUNTER — Other Ambulatory Visit: Payer: Self-pay

## 2021-05-13 VITALS — BP 120/64 | HR 87 | Ht 62.0 in | Wt 150.0 lb

## 2021-05-13 DIAGNOSIS — I491 Atrial premature depolarization: Secondary | ICD-10-CM | POA: Diagnosis not present

## 2021-05-13 DIAGNOSIS — R55 Syncope and collapse: Secondary | ICD-10-CM | POA: Insufficient documentation

## 2021-05-13 DIAGNOSIS — Z940 Kidney transplant status: Secondary | ICD-10-CM

## 2021-05-13 DIAGNOSIS — I739 Peripheral vascular disease, unspecified: Secondary | ICD-10-CM | POA: Diagnosis not present

## 2021-05-13 DIAGNOSIS — S065XAA Traumatic subdural hemorrhage with loss of consciousness status unknown, initial encounter: Secondary | ICD-10-CM

## 2021-05-13 DIAGNOSIS — S065X9A Traumatic subdural hemorrhage with loss of consciousness of unspecified duration, initial encounter: Secondary | ICD-10-CM

## 2021-05-13 DIAGNOSIS — E1169 Type 2 diabetes mellitus with other specified complication: Secondary | ICD-10-CM | POA: Diagnosis not present

## 2021-05-13 DIAGNOSIS — E785 Hyperlipidemia, unspecified: Secondary | ICD-10-CM

## 2021-05-13 DIAGNOSIS — N186 End stage renal disease: Secondary | ICD-10-CM | POA: Insufficient documentation

## 2021-05-13 NOTE — Progress Notes (Signed)
New Outpatient Visit Date: 05/13/2021  Primary Care provider: Albina Billet, MD [address]   [place],  [place] [address]  Chief Complaint: Falls and abnormal telemetry  HPI:  Cindy Fischer is a 75 y.o. female who is being seen today for the evaluation of arrhythmia. She has a history of hypertension, hyperlipidemia, type 2 diabetes mellitus, PAD, ESRD status post renal transplant (2015), and GERD.  She was admitted at [facility] last month after falling.  She woke up on the floor with severe back pain, not remembering how she got there.  She had had 2 preceding falls in the last few weeks and had been somewhat confused.  In the emergency department, she was noted to have subdural hematoma.  On the day of discharge, telemetry was concerning for atrial fibrillation versus sinus rhythm with PACs.  Cardiology consultation was recommended but the patient did not wish to remain hospitalized for this to be completed.  She was seen for recurrent fall in the [facility] ED 4 days ago.  She was noted to be hypoglycemic.  Head CT was stable.  [name] reports that she fell again earlier this week.  She had just gotten out of her sister's car and fell to the ground for unclear reasons.  She was able to get up but then fell again while trying to climb the stairs to get inside.  She believes that she may be passing out briefly when she falls as she does not recall how the falls themselves happen.  Her only prodromal symptom is a brief sense of disorientation.  She denies frank lightheadedness/dizziness and palpitations.  During the episode that led to her admission last month with diagnosis of subdural hematoma, Cindy Fischer believes that she may have just slid out of bed.  She remembers going to bed and waking up the following morning on the floor.  She denies having been in pain to suggest a fall, though provider notes from that admission suggest that she was in quite a bit of pain.  [name] denies a history of heart  problems.  She underwent extensive cardiac testing prior to her kidney transplant at [facility] in 2015.  She has not had any chest pain or shortness of breath.  She has chronic swelling of her right side that has been attributed to lymphedema.  She does not use diuretics out of concern that they could negatively impact her transplant kidney.  --------------------------------------------------------------------------------------------------  Cardiovascular History & Procedures: Cardiovascular Problems:  Questionable atrial fibrillation  Syncope  Risk Factors:  Hypertension, hyperlipidemia, type 2 diabetes mellitus, PAD, and age greater than 68  Cath/PCI:  None  CV Surgery:  Bilateral iliac and right SFA/popliteal PTA/stenting  EP Procedures and Devices:  None  Non-Invasive Evaluation(s):  Echocardiogram (03/29/2021): Normal LV size and wall thickness.  LVEF 45-50% with global hypokinesis and normal diastolic function.  Normal RV size and function.  Mild mitral regurgitation.  (On my personal review of the images, LVEF is closer to 50% though ectopy makes assessment somewhat challenging.  There is grade 1 diastolic dysfunction as well as trivial aortic regurgitation not noted in the official report).  Recent CV Pertinent Labs: Lab Results  Component Value Date   CHOL 145 06/01/2017   HDL 70 06/01/2017   LDLCALC 51 06/01/2017   TRIG 122 06/01/2017   CHOLHDL 2.1 06/01/2017   K 5.2 (H) 04/25/2021   K 4.5 03/29/2015   MG 1.9 04/25/2021   MG 1.6 (L) 03/29/2015  BUN 21 04/25/2021   BUN 10 03/29/2015   CREATININE 0.86 04/25/2021   CREATININE 0.80 03/29/2015    --------------------------------------------------------------------------------------------------  Past Medical History:  Diagnosis Date  . Cancer ([place]) 2016   skin (nose)  . Diabetes mellitus without complication ([place])   . Ezeriah Luty stage renal disease ([place])    s/p renal transplant  . Hypothyroidism   . Pneumonia   .  Renal disorder     Past Surgical History:  Procedure Laterality Date  . CATARACT EXTRACTION, BILATERAL    . COLONOSCOPY  12/02/2010  . COLONOSCOPY WITH PROPOFOL N/A 09/04/2016   Procedure: COLONOSCOPY WITH PROPOFOL;  Surgeon: Christene Lye, MD;  Location: [facility] ENDOSCOPY;  Service: Endoscopy;  Laterality: N/A;  . EYE SURGERY    . INSERTION OF DIALYSIS CATHETER  2014   removed 2014 (only had in for about 8 weeks)  . laser surgery on eyes  1999  . LOWER EXTREMITY ANGIOGRAPHY Right 10/16/2018   Procedure: LOWER EXTREMITY ANGIOGRAPHY;  Surgeon: Katha Cabal, MD;  Location: [place] [facility];  Service: Cardiovascular;  Laterality: Right;  . LOWER EXTREMITY ANGIOGRAPHY Right 02/08/2021   Procedure: LOWER EXTREMITY ANGIOGRAPHY;  Surgeon: Katha Cabal, MD;  Location: [place] [facility];  Service: Cardiovascular;  Laterality: Right;  . LOWER EXTREMITY ANGIOGRAPHY Right 03/02/2021   Procedure: LOWER EXTREMITY ANGIOGRAPHY;  Surgeon: Katha Cabal, MD;  Location: [place] [facility];  Service: Cardiovascular;  Laterality: Right;  . 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 SPINE SURGERY  11/10/2021  . TONSILLECTOMY    . TUBAL LIGATION      Current Meds  Medication Sig  . acetaminophen (TYLENOL) 650 MG CR tablet Take 1,300 mg by mouth every 8 (eight) hours as needed for pain.  Marland Kitchen aspirin EC 81 MG tablet Take 81 mg by mouth daily.  Marland Kitchen atorvastatin (LIPITOR) 20 MG tablet Take 20 mg by mouth daily.  . Cholecalciferol (VITAMIN D) 2000 units tablet Take 2,000 Units by mouth daily.  . clopidogrel (PLAVIX) 75 MG tablet Take 1 tablet (75 mg total) by mouth daily.  . Everolimus 0.5 MG TABS Take 1 mg by mouth 2 (two) times daily.  . insulin NPH Human (HUMULIN N,NOVOLIN N) 100 UNIT/ML injection Inject 30 Units into the skin daily before breakfast.  . insulin regular (NOVOLIN R,HUMULIN R) 100 units/mL injection Inject 3-10 Units into the skin 3 (three) times daily as needed for  high blood sugar. Sliding scale  . levothyroxine (SYNTHROID, LEVOTHROID) 100 MCG tablet Take 100 mcg by mouth See admin instructions. Take 100 mcg daily except skip dose on Saturdays  . lisinopril (PRINIVIL,ZESTRIL) 10 MG tablet Take 10 mg by mouth daily.  Marland Kitchen MAGNESIUM OXIDE PO Take by mouth daily.  Marland Kitchen omeprazole (PRILOSEC) 40 MG capsule Take 40 mg by mouth every other day.   Vladimir Faster Glycol-Propyl Glycol (SYSTANE ULTRA OP) Place 1 drop into both eyes daily as needed (Dry eyes).  . predniSONE (DELTASONE) 5 MG tablet Take 5 mg by mouth daily.   Marland Kitchen SANTYL ointment PLEASE SEE ATTACHED FOR DETAILED DIRECTIONS  . tacrolimus (PROGRAF) 0.5 MG capsule Take 1-1.5 mg by mouth See admin instructions. Take 1.5 mg in the morning and 1 mg in the evening    Allergies: Latex, Voltaren [diclofenac sodium], Oxycodone-acetaminophen, and Penicillins  Social History   Tobacco Use  . Smoking status: Former Smoker    Packs/day: 1.00    Years: 35.00    Pack years: 35.00    Types:  Cigarettes    Quit date: 2015    Years since quitting: 7.3  . Smokeless tobacco: Never Used  Vaping Use  . Vaping Use: Never used  Substance Use Topics  . Alcohol use: No  . Drug use: No    Family History  Problem Relation Age of Onset  . Multiple myeloma Mother   . Diabetes Father   . Heart attack Father 20  . Breast cancer Neg Hx     Review of Systems: A 12-system review of systems was performed and was negative except as noted in the HPI.  --------------------------------------------------------------------------------------------------  Physical Exam: BP 120/64 (BP Location: Right Arm, Patient Position: Sitting, Cuff Size: Normal)   Pulse 87   Ht '5\' 2"'  (1.575 m)   Wt 150 lb (68 kg)   SpO2 95%   BMI 27.44 kg/m   General:  NAD. HEENT: No conjunctival pallor or scleral icterus. Facemask in place. Neck: Supple without lymphadenopathy, thyromegaly, JVD, or HJR. No carotid bruit. Lungs: Normal work of  breathing. Clear to auscultation bilaterally without wheezes or crackles. Heart: Regular rate and rhythm with 1/6 systolic murmur.  No rubs or gallops. Non-displaced PMI. Abd: Bowel sounds present. Soft, NT/ND without hepatosplenomegaly Ext: 1+ non-pitting edema in right arm and leg.  Pedal pulses are diminished bilaterally. Skin: Warm and dry without rash. Neuro: CNIII-XII intact. Strength and fine-touch sensation intact in upper and lower extremities bilaterally. Psych: Normal mood and affect.  EKG:  Normal sinus rhythm with frequent PAC's, low voltage, and RBBB.  Heart rate has decreased from prior tracing on 04/16/2021.  Otherwise, there has been no significant interval change.  Lab Results  Component Value Date   WBC 7.6 04/25/2021   HGB 12.9 04/25/2021   HCT 39.5 04/25/2021   MCV 91.2 04/25/2021   PLT 611 (H) 04/25/2021    Lab Results  Component Value Date   NA 134 (L) 04/25/2021   K 5.2 (H) 04/25/2021   CL 100 04/25/2021   CO2 28 04/25/2021   BUN 21 04/25/2021   CREATININE 0.86 04/25/2021   GLUCOSE 118 (H) 04/25/2021   ALT 14 04/16/2021    Lab Results  Component Value Date   CHOL 145 06/01/2017   HDL 70 06/01/2017   LDLCALC 51 06/01/2017   TRIG 122 06/01/2017   CHOLHDL 2.1 06/01/2017     --------------------------------------------------------------------------------------------------  ASSESSMENT AND PLAN: PAC's: EKG and available telemetry strips from recent hospitalization have been reviewed and are most consistent with sinus rhythm with frequent PAC's and possible episodes of multifocal atrial tachycardia.  I have not seen clear evidence of atrial fibrillation.  Given frequent falls and recent subdural hematoma, Cindy Fischer would not be a candidate for therapeutic anticoagulation despite a CHADSVASC score of at least 6.  I have recommended that we obtain an 14 day event monitor for further evaluation, particularly in light of recurrent unexplained  falls/syncope.  Syncope and subdural hematoma: Cindy Fischer has suffered multiple falls over the last few weeks leading to significant injuries (including subdural hematoma in the setting of being on dual antiplatelet therapy following endovascular intervention by vascular surgery).  There are no obvious prodromal symptoms leading up to falls.  I am concerned that transient pauses could be playing a role.  We will begin with a 14-day event monitor (Zio AT), as well as a pharmacologic myocardial perfusion stress test in the setting of multiple cardiac risk factors, abnormal EKG, and recent echo with borderline reduced LVEF.  I have advised Cindy Fischer  to refrain from driving or operating heavy machinery.  If recurrent syncope persists without clear cause found on aforementioned workup, we will need to consider referring her to EP for loop recorder placement.  PAD: No symptoms at this time.  Continue ongoing follow-up with vascular surgery.  Hyperlipidemia associated with type 2 diabetes mellitus: No recent lipid panel noted in our system.  It is possible that Dr. Hall Busing has more recent results.  We will plan to continue atorvastatin 20 mg daily for target LDL < 70 in the setting of PAD.  Ongoing management of DM per Dr. Hall Busing.  ESRD s/p renal transplant: Continue current medications with avoidance of nephrotoxic drugs.  Shared Decision Making/Informed Consent The risks [chest pain, shortness of breath, cardiac arrhythmias, dizziness, blood pressure fluctuations, myocardial infarction, stroke/transient ischemic attack, nausea, vomiting, allergic reaction, radiation exposure, metallic taste sensation and life-threatening complications (estimated to be 1 in 10,000)], benefits (risk stratification, diagnosing coronary artery disease, treatment guidance) and alternatives of a nuclear stress test were discussed in detail with Cindy Fischer and she agrees to proceed.  Nelva Bush, MD 05/13/2021 9:02 PM

## 2021-05-13 NOTE — Patient Instructions (Signed)
Medication Instructions:  - Your physician recommends that you continue on your current medications as directed. Please refer to the Current Medication list given to you today.  *If you need a refill on your cardiac medications before your next appointment, please call your pharmacy*   Lab Work: - none ordered  If you have labs (blood work) drawn today and your tests are completely normal, you will receive your results only by: Marland Kitchen MyChart Message (if you have MyChart) OR . A paper copy in the mail If you have any lab test that is abnormal or we need to change your treatment, we will call you to review the results.   Testing/Procedures: 1) ZIO AT (heart) monitor:  Your physician has recommended that you wear a Zio AT (heart) monitor x 14 days.- to be placed in office today   This monitor is a medical device that records the heart's electrical activity. Doctors most often use these monitors to diagnose arrhythmias. Arrhythmias are problems with the speed or rhythm of the heartbeat. The monitor is a small device applied to your chest. You can wear one while you do your normal daily activities. While wearing this monitor if you have any symptoms to push the button and record what you felt. Once you have worn this monitor for the period of time provider prescribed (Usually 14 days), you will return the monitor device in the postage paid box. Once it is returned they will download the data collected and provide [place] with a report which the provider will then review and we will call you with those results. Important tips:  1. Avoid showering during the first 24 hours of wearing the monitor. 2. Avoid excessive sweating to help maximize wear time. 3. Do not submerge the device, no hot tubs, and no swimming pools. 4. Keep any lotions or oils away from the patch. 5. After 24 hours you may shower with the patch on. Take brief showers with your back facing the shower head.  6. Do not remove patch once it  has been placed because that will interrupt data and decrease adhesive wear time. 7. Push the button when you have any symptoms and write down what you were feeling. 8. Once you have completed wearing your monitor, remove and place into box which has postage paid and place in your outgoing mailbox.  9. If for some reason you have misplaced your box then call our office and we can provide another box and/or mail it off for you.   2) Lexicscan Myoview (Chemica Stress Test)- to be done after 05/27/21  Your physician has requested that you have a lexiscan myoview.  [place]  Your caregiver has ordered a Stress Test with nuclear imaging. The purpose of this test is to evaluate the blood supply to your heart muscle. This procedure is referred to as a "Non-Invasive Stress Test." This is because other than having an IV started in your vein, nothing is inserted or "invades" your body. Cardiac stress tests are done to find areas of poor blood flow to the heart by determining the extent of coronary artery disease (CAD). Some patients exercise on a treadmill, which naturally increases the blood flow to your heart, while others who are  unable to walk on a treadmill due to physical limitations have a pharmacologic/chemical stress agent called Lexiscan . This medicine will mimic walking on a treadmill by temporarily increasing your coronary blood flow.   Please note: these test may take anywhere between 2-4 hours  to complete  PLEASE REPORT TO [facility] MEDICAL MALL ENTRANCE  THE VOLUNTEERS AT THE FIRST DESK WILL DIRECT YOU WHERE TO GO  Date of Procedure:_____________________________________  Arrival Time for Procedure:______________________________  Instructions regarding medication:   __x__ : Hold all diabetes medications morning of procedure (Oral & Insulins)  _x___:  You may take all of your other regular morning medications the day of your test with enough water to get them down safely  PLEASE NOTIFY  THE OFFICE AT LEAST 24 HOURS IN ADVANCE IF YOU ARE UNABLE TO [place].  [phone] AND  PLEASE NOTIFY NUCLEAR MEDICINE AT [facility] AT LEAST 24 HOURS IN ADVANCE IF YOU ARE UNABLE TO KEEP YOUR APPOINTMENT. [phone]  How to prepare for your Myoview test:  1. Do not eat or drink after midnight 2. No caffeine for 24 hours prior to test 3. No smoking 24 hours prior to test. 4. Your medication may be taken with water.  If your doctor stopped a medication because of this test, do not take that medication. 5. Ladies, please do not wear dresses.  Skirts or pants are appropriate. Please wear a short sleeve shirt. 6. No perfume, cologne or lotion. 7. Wear comfortable walking shoes. No heels!   Follow-Up: At [facility], you and your health needs are our priority.  As part of our continuing mission to provide you with exceptional heart care, we have created designated Provider Care Teams.  These Care Teams include your primary Cardiologist (physician) and Advanced Practice Providers (APPs -  Physician Assistants and Nurse Practitioners) who all work together to provide you with the care you need, when you need it.  We recommend signing up for the patient portal called "MyChart".  Sign up information is provided on this After Visit Summary.  MyChart is used to connect with patients for Virtual Visits (Telemedicine).  Patients are able to view lab/test results, encounter notes, upcoming appointments, etc.  Non-urgent messages can be sent to your provider as well.   To learn more about what you can do with MyChart, go to [email or web address].    Your next appointment:   6 week(s)  The format for your next appointment:   In Person  Provider:   You may see Nelva Bush, MD or one of the following Advanced Practice Providers on your designated Care Team:    Murray Hodgkins, NP  Christell Faith, PA-C  Marrianne Mood, PA-C  Cadence [place], [place]  Laurann Montana, NP    Other  Instructions  1) NO DRIVING

## 2021-05-25 ENCOUNTER — Ambulatory Visit: Admit: 2021-05-25 | Discharge: 2021-05-26 | Payer: MEDICARE

## 2021-05-25 ENCOUNTER — Other Ambulatory Visit
Admission: RE | Admit: 2021-05-25 | Discharge: 2021-05-25 | Disposition: A | Discharge status: Home / Self Care | Payer: Medicare Other | Attending: Nephrology | Admitting: Nephrology

## 2021-05-25 DIAGNOSIS — D899 Disorder involving the immune mechanism, unspecified: Secondary | ICD-10-CM | POA: Diagnosis present

## 2021-05-25 DIAGNOSIS — E1129 Type 2 diabetes mellitus with other diabetic kidney complication: Secondary | ICD-10-CM | POA: Diagnosis not present

## 2021-05-25 DIAGNOSIS — Z789 Other specified health status: Secondary | ICD-10-CM | POA: Diagnosis not present

## 2021-05-25 DIAGNOSIS — B259 Cytomegaloviral disease, unspecified: Secondary | ICD-10-CM | POA: Insufficient documentation

## 2021-05-25 DIAGNOSIS — Z09 Encounter for follow-up examination after completed treatment for conditions other than malignant neoplasm: Secondary | ICD-10-CM | POA: Diagnosis not present

## 2021-05-25 DIAGNOSIS — Z114 Encounter for screening for human immunodeficiency virus [HIV]: Secondary | ICD-10-CM | POA: Diagnosis not present

## 2021-05-25 DIAGNOSIS — D631 Anemia in chronic kidney disease: Secondary | ICD-10-CM | POA: Insufficient documentation

## 2021-05-25 DIAGNOSIS — E559 Vitamin D deficiency, unspecified: Secondary | ICD-10-CM | POA: Insufficient documentation

## 2021-05-25 DIAGNOSIS — N390 Urinary tract infection, site not specified: Secondary | ICD-10-CM | POA: Insufficient documentation

## 2021-05-25 DIAGNOSIS — Z940 Kidney transplant status: Secondary | ICD-10-CM | POA: Insufficient documentation

## 2021-05-25 DIAGNOSIS — N189 Chronic kidney disease, unspecified: Secondary | ICD-10-CM | POA: Diagnosis not present

## 2021-05-25 DIAGNOSIS — Z79899 Other long term (current) drug therapy: Secondary | ICD-10-CM | POA: Insufficient documentation

## 2021-05-25 DIAGNOSIS — Z9483 Pancreas transplant status: Secondary | ICD-10-CM | POA: Insufficient documentation

## 2021-05-25 LAB — BASIC METABOLIC PANEL
Anion gap: 5 (ref 5–15)
BUN: 18 mg/dL (ref 8–23)
CO2: 26 mmol/L (ref 22–32)
Calcium: 9 mg/dL (ref 8.9–10.3)
Chloride: 105 mmol/L (ref 98–111)
Creatinine, Ser: 0.73 mg/dL (ref 0.44–1.00)
GFR, Estimated: >60 mL/min (ref >60)
Glucose, Bld: 87 mg/dL (ref 70–99)
Potassium: 4.1 mmol/L (ref 3.5–5.1)
Sodium: 136 mmol/L (ref 135–145)

## 2021-05-25 LAB — CBC WITH DIFFERENTIAL/PLATELET
Abs Immature Granulocytes: 0.07 10*3/uL (ref 0.00–0.07)
Basophils Absolute: 0 10*3/uL (ref 0.0–0.1)
Basophils Relative: 0 %
Eosinophils Absolute: 0.1 10*3/uL (ref 0.0–0.5)
Eosinophils Relative: 1 %
HCT: 41.8 % (ref 36.0–46.0)
Hemoglobin: 13.7 g/dL (ref 12.0–15.0)
Immature Granulocytes: 1 %
Lymphocytes Relative: 32 %
Lymphs Abs: 1.9 10*3/uL (ref 0.7–4.0)
MCH: 28.8 pg (ref 26.0–34.0)
MCHC: 32.8 g/dL (ref 30.0–36.0)
MCV: 88 fL (ref 80.0–100.0)
Monocytes Absolute: 0.8 10*3/uL (ref 0.1–1.0)
Monocytes Relative: 14 %
Neutro Abs: 3 10*3/uL (ref 1.7–7.7)
Neutrophils Relative %: 52 %
Platelets: 506 10*3/uL — ABNORMAL HIGH (ref 150–400)
RBC: 4.75 MIL/uL (ref 3.87–5.11)
RDW: 14.6 % (ref 11.5–15.5)
WBC: 5.8 10*3/uL (ref 4.0–10.5)
nRBC: 0 % (ref 0.0–0.2)

## 2021-05-25 LAB — MAGNESIUM: Magnesium: 1.9 mg/dL (ref 1.7–2.4)

## 2021-05-25 LAB — PHOSPHORUS: Phosphorus: 3.2 mg/dL (ref 2.5–4.6)

## 2021-05-27 DIAGNOSIS — R55 Syncope and collapse: Secondary | ICD-10-CM | POA: Diagnosis not present

## 2021-05-27 MED ORDER — ATORVASTATIN 20 MG TABLET
ORAL_TABLET | 11 refills | 0 days | Status: CP
Start: 2021-05-27 — End: ?

## 2021-05-30 ENCOUNTER — Encounter
Admission: RE | Admit: 2021-05-30 | Discharge: 2021-05-30 | Disposition: A | Discharge status: Home / Self Care | Payer: Medicare Other | Source: Ambulatory Visit | Attending: Internal Medicine | Admitting: Internal Medicine

## 2021-05-30 DIAGNOSIS — R55 Syncope and collapse: Secondary | ICD-10-CM | POA: Diagnosis present

## 2021-05-30 LAB — NM MYOCAR MULTI W/SPECT W/WALL MOTION / EF
Estimated workload: 1 METS
Exercise duration (min): 0 min
Exercise duration (sec): 0 s
LV dias vol: 24 mL (ref 46–106)
LV sys vol: 9 mL
MPHR: 145 {beats}/min
Peak HR: 102 {beats}/min
Percent HR: 70 %
Rest HR: 75 {beats}/min
SDS: 0
SRS: 0
SSS: 0
TID: 0.7

## 2021-05-30 MED ORDER — TECHNETIUM TC 99M TETROFOSMIN IV KIT
30.9100 | PACK | Freq: Once | INTRAVENOUS | Status: AC | PRN
  Administered 2021-05-30: 30.91 via INTRAVENOUS

## 2021-05-30 MED ORDER — REGADENOSON 0.4 MG/5ML IV SOLN
0.4000 mg | Freq: Once | INTRAVENOUS | Status: AC
  Administered 2021-05-30: 0.4 mg via INTRAVENOUS
  Filled 2021-05-30: qty 5

## 2021-05-30 MED ORDER — TECHNETIUM TC 99M TETROFOSMIN IV KIT
10.5900 | PACK | Freq: Once | INTRAVENOUS | Status: AC | PRN
  Administered 2021-05-30: 10.59 via INTRAVENOUS

## 2021-05-31 ENCOUNTER — Ambulatory Visit: Admit: 2021-05-31 | Discharge: 2021-06-01 | Payer: MEDICARE

## 2021-05-31 ENCOUNTER — Other Ambulatory Visit: Payer: Self-pay | Admitting: Neurosurgery

## 2021-05-31 ENCOUNTER — Other Ambulatory Visit (HOSPITAL_COMMUNITY): Payer: Self-pay | Admitting: Neurosurgery

## 2021-05-31 DIAGNOSIS — E118 Type 2 diabetes mellitus with unspecified complications: Principal | ICD-10-CM

## 2021-05-31 DIAGNOSIS — D849 Immunodeficiency, unspecified: Principal | ICD-10-CM

## 2021-05-31 DIAGNOSIS — I10 Essential (primary) hypertension: Principal | ICD-10-CM

## 2021-05-31 DIAGNOSIS — D751 Secondary polycythemia: Principal | ICD-10-CM

## 2021-05-31 DIAGNOSIS — Z48298 Encounter for aftercare following other organ transplant: Principal | ICD-10-CM

## 2021-05-31 DIAGNOSIS — Q046 Congenital cerebral cysts: Secondary | ICD-10-CM

## 2021-06-01 ENCOUNTER — Other Ambulatory Visit: Payer: Self-pay

## 2021-06-01 ENCOUNTER — Encounter: Payer: Self-pay | Admitting: Podiatry

## 2021-06-01 ENCOUNTER — Ambulatory Visit (INDEPENDENT_AMBULATORY_CARE_PROVIDER_SITE_OTHER): Payer: Medicare Other | Admitting: Podiatry

## 2021-06-01 DIAGNOSIS — E1159 Type 2 diabetes mellitus with other circulatory complications: Secondary | ICD-10-CM

## 2021-06-01 DIAGNOSIS — Z794 Long term (current) use of insulin: Secondary | ICD-10-CM

## 2021-06-01 DIAGNOSIS — L97511 Non-pressure chronic ulcer of other part of right foot limited to breakdown of skin: Secondary | ICD-10-CM

## 2021-06-01 DIAGNOSIS — I739 Peripheral vascular disease, unspecified: Secondary | ICD-10-CM

## 2021-06-05 NOTE — Progress Notes (Signed)
  Subjective:  Patient ID: Cindy Fischer, female    DOB: 1946-09-14,  MRN: [id number]  Chief Complaint  Patient presents with   Wound Check    "its doing well.  Its about healed"    75 y.o. female returns with the above complaint. History confirmed with patient.  Doing well thinks is improving  Objective:  Physical Exam: blanching with elevation noted, dependent rubor present, DP absent right, ulceration present at right hallux, normal sensory exam and PT absent right.  Foot is much warmer than previous, improving significantly, erythema is gone and ulcer seem to be healing  Fiber granular ulcerations measuring as follows: Left dorsal hallux 1.3 cm x 1.3cm x 0.2cm  Remainder of ulcers healed now  Angiography reviewed: She has good two-vessel run off to and through the foot via the AT and PT       Assessment:   1. 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 Type 2 diabetes mellitus with other circulatory complication, with long-term current use of insulin ([place])   3. Peripheral arterial disease ([place])       Plan:  Patient was evaluated and treated and all questions answered.  Ulcer right foot -Nearly fully healed -Debrided today as below -Continue santyl      Procedure: Excisional Debridement of Wound Rationale: Removal of non-viable soft tissue from the wound to promote healing.  Anesthesia: none  Post-Debridement Wound Measurements: 1.3 cm x 1.3cm x 0.2cm Type of Debridement: Sharp Excisional Tissue Removed: Non-viable soft tissue Depth of Debridement: subcutaneous tissue. Technique: Sharp excisional debridement to bleeding, viable wound base.  Dressing: Dry, sterile, compression dressing. Disposition: Patient tolerated procedure well.   Return in about 4 weeks (around 06/29/2021) for wound care.           Return in about 4 weeks (around 06/29/2021) for wound care.

## 2021-06-07 MED ORDER — PREDNISONE 5 MG TABLET
ORAL_TABLET | 11 refills | 0 days | Status: CP
Start: 2021-06-07 — End: ?

## 2021-06-11 ENCOUNTER — Other Ambulatory Visit: Payer: Self-pay

## 2021-06-11 ENCOUNTER — Ambulatory Visit
Admission: RE | Admit: 2021-06-11 | Discharge: 2021-06-11 | Disposition: A | Discharge status: Home / Self Care | Payer: Medicare Other | Source: Ambulatory Visit | Attending: Neurosurgery | Admitting: Neurosurgery

## 2021-06-11 DIAGNOSIS — Q046 Congenital cerebral cysts: Secondary | ICD-10-CM | POA: Diagnosis not present

## 2021-06-14 DIAGNOSIS — Z940 Kidney transplant status: Principal | ICD-10-CM

## 2021-06-15 MED ORDER — TACROLIMUS 0.5 MG CAPSULE, IMMEDIATE-RELEASE
ORAL_CAPSULE | 6 refills | 0 days | Status: CP
Start: 2021-06-15 — End: ?

## 2021-06-16 ENCOUNTER — Telehealth: Payer: Self-pay | Admitting: *Deleted

## 2021-06-16 ENCOUNTER — Ambulatory Visit: Payer: Medicare Other | Admitting: Internal Medicine

## 2021-06-16 NOTE — Telephone Encounter (Signed)
-----   Message from Nelva Bush, MD sent at 06/15/2021  8:46 AM EDT ----- Please let Ms. O'Hair know that her event monitor did not show evidence of atrial fibrillation.  However, there were numerous episodes of elevated heart rates (supraventricular tachycardia).  It is possible that significantly elevated heart rates could contribute to some dizziness/falls.  I recommend consultation with electrophysiology at her earliest convenience.  In the meantime, I suggest adding metoprolol succinate 12.5 mg daily.

## 2021-06-16 NOTE — Telephone Encounter (Signed)
Spoke to pt, notified of heart monitor results and Dr. Darnelle Bos recc below.  Pt voiced understanding, however, refused starting Metoprolol Succinate and refused EP consult.  Discussed further with pt to ensure understanding of medication's purpose regarding numerous episodes of SVT.   Pt responded with "I do not want to take any more medication now. I feel my symptoms were due to low blood sugar. I also had back surgery in November 2021 and have had significant pain in my hips, and I think maybe this was also causing incr HR and s/s." Pt also states she has not had any further episodes of dizziness/falls.   Advised pt to let us know if she does have recurrent s/s and/or decides to start Metoprolol and/or EP consult. Pt agrees to the above. Will forward to provider to make aware.   Pt will f/u as scheduled 06/28/21 with Cadence Kathlen Mody, PA-C.

## 2021-06-23 ENCOUNTER — Ambulatory Visit (INDEPENDENT_AMBULATORY_CARE_PROVIDER_SITE_OTHER): Payer: Medicare Other

## 2021-06-23 ENCOUNTER — Encounter (INDEPENDENT_AMBULATORY_CARE_PROVIDER_SITE_OTHER): Payer: Self-pay

## 2021-06-23 ENCOUNTER — Ambulatory Visit (INDEPENDENT_AMBULATORY_CARE_PROVIDER_SITE_OTHER): Payer: Medicare Other | Admitting: Nurse Practitioner

## 2021-06-23 ENCOUNTER — Other Ambulatory Visit: Payer: Self-pay

## 2021-06-23 DIAGNOSIS — I70213 Atherosclerosis of native arteries of extremities with intermittent claudication, bilateral legs: Secondary | ICD-10-CM

## 2021-06-24 ENCOUNTER — Other Ambulatory Visit: Admit: 2021-06-24 | Discharge: 2021-06-25 | Payer: MEDICARE

## 2021-06-24 ENCOUNTER — Other Ambulatory Visit: Payer: Self-pay

## 2021-06-24 ENCOUNTER — Other Ambulatory Visit
Admission: RE | Admit: 2021-06-24 | Discharge: 2021-06-24 | Disposition: A | Discharge status: Home / Self Care | Payer: Medicare Other | Attending: Nephrology | Admitting: Nephrology

## 2021-06-24 DIAGNOSIS — Z789 Other specified health status: Secondary | ICD-10-CM | POA: Diagnosis not present

## 2021-06-24 DIAGNOSIS — Z09 Encounter for follow-up examination after completed treatment for conditions other than malignant neoplasm: Secondary | ICD-10-CM | POA: Insufficient documentation

## 2021-06-24 DIAGNOSIS — Z9483 Pancreas transplant status: Secondary | ICD-10-CM | POA: Insufficient documentation

## 2021-06-24 DIAGNOSIS — T8610 Unspecified complication of kidney transplant: Secondary | ICD-10-CM | POA: Insufficient documentation

## 2021-06-24 DIAGNOSIS — X58XXXA Exposure to other specified factors, initial encounter: Secondary | ICD-10-CM | POA: Insufficient documentation

## 2021-06-24 DIAGNOSIS — E1129 Type 2 diabetes mellitus with other diabetic kidney complication: Secondary | ICD-10-CM | POA: Insufficient documentation

## 2021-06-24 DIAGNOSIS — D631 Anemia in chronic kidney disease: Secondary | ICD-10-CM | POA: Insufficient documentation

## 2021-06-24 DIAGNOSIS — D899 Disorder involving the immune mechanism, unspecified: Secondary | ICD-10-CM | POA: Insufficient documentation

## 2021-06-24 DIAGNOSIS — B259 Cytomegaloviral disease, unspecified: Secondary | ICD-10-CM | POA: Diagnosis not present

## 2021-06-24 DIAGNOSIS — N390 Urinary tract infection, site not specified: Secondary | ICD-10-CM | POA: Insufficient documentation

## 2021-06-24 DIAGNOSIS — Z114 Encounter for screening for human immunodeficiency virus [HIV]: Secondary | ICD-10-CM | POA: Diagnosis not present

## 2021-06-24 DIAGNOSIS — Z940 Kidney transplant status: Secondary | ICD-10-CM | POA: Insufficient documentation

## 2021-06-24 DIAGNOSIS — Z79899 Other long term (current) drug therapy: Secondary | ICD-10-CM | POA: Diagnosis not present

## 2021-06-24 LAB — BASIC METABOLIC PANEL
Anion gap: 8 (ref 5–15)
BUN: 24 mg/dL — ABNORMAL HIGH (ref 8–23)
CO2: 25 mmol/L (ref 22–32)
Calcium: 8.9 mg/dL (ref 8.9–10.3)
Chloride: 103 mmol/L (ref 98–111)
Creatinine, Ser: 0.79 mg/dL (ref 0.44–1.00)
GFR, Estimated: >60 mL/min (ref >60)
Glucose, Bld: 74 mg/dL (ref 70–99)
Potassium: 4 mmol/L (ref 3.5–5.1)
Sodium: 136 mmol/L (ref 135–145)

## 2021-06-24 LAB — CBC WITH DIFFERENTIAL/PLATELET
Abs Immature Granulocytes: 0.03 10*3/uL (ref 0.00–0.07)
Basophils Absolute: 0 10*3/uL (ref 0.0–0.1)
Basophils Relative: 0 %
Eosinophils Absolute: 0.1 10*3/uL (ref 0.0–0.5)
Eosinophils Relative: 1 %
HCT: 40.3 % (ref 36.0–46.0)
Hemoglobin: 13.3 g/dL (ref 12.0–15.0)
Immature Granulocytes: 0 %
Lymphocytes Relative: 25 %
Lymphs Abs: 2.2 10*3/uL (ref 0.7–4.0)
MCH: 27.7 pg (ref 26.0–34.0)
MCHC: 33 g/dL (ref 30.0–36.0)
MCV: 84 fL (ref 80.0–100.0)
Monocytes Absolute: 1 10*3/uL (ref 0.1–1.0)
Monocytes Relative: 11 %
Neutro Abs: 5.5 10*3/uL (ref 1.7–7.7)
Neutrophils Relative %: 63 %
Platelets: 617 10*3/uL — ABNORMAL HIGH (ref 150–400)
RBC: 4.8 MIL/uL (ref 3.87–5.11)
RDW: 14.6 % (ref 11.5–15.5)
WBC: 8.8 10*3/uL (ref 4.0–10.5)
nRBC: 0 % (ref 0.0–0.2)

## 2021-06-24 LAB — PHOSPHORUS: Phosphorus: 4.1 mg/dL (ref 2.5–4.6)

## 2021-06-24 LAB — MAGNESIUM: Magnesium: 1.9 mg/dL (ref 1.7–2.4)

## 2021-06-28 ENCOUNTER — Ambulatory Visit (INDEPENDENT_AMBULATORY_CARE_PROVIDER_SITE_OTHER): Payer: Medicare Other | Admitting: Medical

## 2021-06-28 ENCOUNTER — Other Ambulatory Visit: Payer: Self-pay

## 2021-06-28 ENCOUNTER — Encounter: Payer: Self-pay | Admitting: Medical

## 2021-06-28 VITALS — BP 120/70 | HR 98 | Ht 62.0 in | Wt 158.0 lb

## 2021-06-28 DIAGNOSIS — R55 Syncope and collapse: Secondary | ICD-10-CM

## 2021-06-28 DIAGNOSIS — I471 Supraventricular tachycardia: Secondary | ICD-10-CM

## 2021-06-28 NOTE — Progress Notes (Signed)
  Cardiology Office Note:    Date:  06/28/2021   Patient refused visit.  Upon further conversation, patient refused Dr. Marisue Humble recommendations for BB for pSVT and was not interested in EP visit. She said she was not satisfied with our service or medcal recommendations. Also said wait time was long, however apt was at 3pm and provider entered the room at 3:20pm (she checked in early). She would not let me perform a physical exam. She said she would look for another cardiologist. Will forward to Dr. Saunders Revel.   Signed, Nerea Bordenave Ninfa Meeker, PA-C  06/28/2021 9:27 AM    [facility]

## 2021-06-29 ENCOUNTER — Ambulatory Visit (INDEPENDENT_AMBULATORY_CARE_PROVIDER_SITE_OTHER): Payer: Medicare Other | Admitting: Podiatry

## 2021-06-29 ENCOUNTER — Encounter: Payer: Self-pay | Admitting: Podiatry

## 2021-06-29 DIAGNOSIS — L97511 Non-pressure chronic ulcer of other part of right foot limited to breakdown of skin: Secondary | ICD-10-CM

## 2021-06-29 DIAGNOSIS — E1159 Type 2 diabetes mellitus with other circulatory complications: Secondary | ICD-10-CM

## 2021-06-29 DIAGNOSIS — I739 Peripheral vascular disease, unspecified: Secondary | ICD-10-CM

## 2021-06-29 DIAGNOSIS — Z794 Long term (current) use of insulin: Secondary | ICD-10-CM

## 2021-07-02 NOTE — Progress Notes (Signed)
  Subjective:  Patient ID: Cindy Fischer, female    DOB: January 09, 1946,  MRN: [id number]  Chief Complaint  Patient presents with   Wound Check    "Its almost healed"    75 y.o. female returns with the above complaint. History confirmed with patient.  Doing well thinks is improving  Objective:  Physical Exam: blanching with elevation noted, dependent rubor present, DP absent right, ulceration present at right hallux, normal sensory exam and PT absent right.  Foot is much warmer than previous, improving significantly, erythema is gone and ulcer seem to be healing  Fiber granular ulcerations measuring as follows: Left dorsal hallux 0 point x0.3x0.1  Remainder of ulcers healed now  Angiography reviewed: She has good two-vessel run off to and through the foot via the AT and PT        Assessment:   No diagnosis found.     Plan:  Patient was evaluated and treated and all questions answered.  Ulcer right foot -Nearly fully healed -Debrided today as below -Continue santyl      Procedure: Excisional Debridement of Wound Rationale: Removal of non-viable soft tissue from the wound to promote healing.  Anesthesia: none  Post-Debridement Wound Measurements: 0.8 x 0.3 x 0.1 Type of Debridement: Sharp Excisional Tissue Removed: Non-viable soft tissue Depth of Debridement: subcutaneous tissue. Technique: Sharp excisional debridement to bleeding, viable wound base.  Dressing: Dry, sterile, compression dressing. Disposition: Patient tolerated procedure well.   Return in about 4 weeks (around 07/27/2021) for wound care.           Return in about 4 weeks (around 07/27/2021) for wound care.

## 2021-07-05 ENCOUNTER — Ambulatory Visit: Admit: 2021-07-05 | Discharge: 2021-07-06 | Payer: MEDICARE

## 2021-07-05 DIAGNOSIS — Z1283 Encounter for screening for malignant neoplasm of skin: Principal | ICD-10-CM

## 2021-07-05 DIAGNOSIS — Z85828 Personal history of other malignant neoplasm of skin: Principal | ICD-10-CM

## 2021-07-05 DIAGNOSIS — L821 Other seborrheic keratosis: Principal | ICD-10-CM

## 2021-07-05 DIAGNOSIS — L570 Actinic keratosis: Principal | ICD-10-CM

## 2021-07-10 ENCOUNTER — Other Ambulatory Visit (INDEPENDENT_AMBULATORY_CARE_PROVIDER_SITE_OTHER): Payer: Self-pay | Admitting: Vascular Surgery

## 2021-07-12 ENCOUNTER — Encounter (INDEPENDENT_AMBULATORY_CARE_PROVIDER_SITE_OTHER): Payer: Self-pay | Admitting: *Deleted

## 2021-07-26 ENCOUNTER — Other Ambulatory Visit: Admit: 2021-07-26 | Discharge: 2021-07-27 | Payer: MEDICARE

## 2021-07-26 ENCOUNTER — Other Ambulatory Visit
Admission: RE | Admit: 2021-07-26 | Discharge: 2021-07-26 | Disposition: A | Discharge status: Home / Self Care | Payer: Medicare Other | Attending: Nephrology | Admitting: Nephrology

## 2021-07-26 DIAGNOSIS — D631 Anemia in chronic kidney disease: Secondary | ICD-10-CM | POA: Insufficient documentation

## 2021-07-26 DIAGNOSIS — D899 Disorder involving the immune mechanism, unspecified: Secondary | ICD-10-CM | POA: Insufficient documentation

## 2021-07-26 DIAGNOSIS — N390 Urinary tract infection, site not specified: Secondary | ICD-10-CM | POA: Diagnosis not present

## 2021-07-26 DIAGNOSIS — Z9483 Pancreas transplant status: Secondary | ICD-10-CM | POA: Diagnosis not present

## 2021-07-26 DIAGNOSIS — B259 Cytomegaloviral disease, unspecified: Secondary | ICD-10-CM | POA: Diagnosis not present

## 2021-07-26 DIAGNOSIS — E1129 Type 2 diabetes mellitus with other diabetic kidney complication: Secondary | ICD-10-CM | POA: Diagnosis not present

## 2021-07-26 DIAGNOSIS — Z79899 Other long term (current) drug therapy: Secondary | ICD-10-CM | POA: Diagnosis not present

## 2021-07-26 DIAGNOSIS — Z09 Encounter for follow-up examination after completed treatment for conditions other than malignant neoplasm: Secondary | ICD-10-CM | POA: Insufficient documentation

## 2021-07-26 DIAGNOSIS — Z789 Other specified health status: Secondary | ICD-10-CM | POA: Diagnosis not present

## 2021-07-26 DIAGNOSIS — E559 Vitamin D deficiency, unspecified: Secondary | ICD-10-CM | POA: Insufficient documentation

## 2021-07-26 DIAGNOSIS — Z940 Kidney transplant status: Secondary | ICD-10-CM | POA: Diagnosis not present

## 2021-07-26 DIAGNOSIS — Z114 Encounter for screening for human immunodeficiency virus [HIV]: Secondary | ICD-10-CM | POA: Diagnosis not present

## 2021-07-26 LAB — CBC WITH DIFFERENTIAL/PLATELET
Abs Immature Granulocytes: 0.04 10*3/uL (ref 0.00–0.07)
Basophils Absolute: 0 10*3/uL (ref 0.0–0.1)
Basophils Relative: 0 %
Eosinophils Absolute: 0.1 10*3/uL (ref 0.0–0.5)
Eosinophils Relative: 2 %
HCT: 41.2 % (ref 36.0–46.0)
Hemoglobin: 13.5 g/dL (ref 12.0–15.0)
Immature Granulocytes: 1 %
Lymphocytes Relative: 35 %
Lymphs Abs: 2.4 10*3/uL (ref 0.7–4.0)
MCH: 27.4 pg (ref 26.0–34.0)
MCHC: 32.8 g/dL (ref 30.0–36.0)
MCV: 83.7 fL (ref 80.0–100.0)
Monocytes Absolute: 0.9 10*3/uL (ref 0.1–1.0)
Monocytes Relative: 13 %
Neutro Abs: 3.3 10*3/uL (ref 1.7–7.7)
Neutrophils Relative %: 49 %
Platelets: 583 10*3/uL — ABNORMAL HIGH (ref 150–400)
RBC: 4.92 MIL/uL (ref 3.87–5.11)
RDW: 16.1 % — ABNORMAL HIGH (ref 11.5–15.5)
WBC: 6.8 10*3/uL (ref 4.0–10.5)
nRBC: 0 % (ref 0.0–0.2)

## 2021-07-26 LAB — PHOSPHORUS: Phosphorus: 3.9 mg/dL (ref 2.5–4.6)

## 2021-07-26 LAB — MAGNESIUM: Magnesium: 2 mg/dL (ref 1.7–2.4)

## 2021-07-26 LAB — BASIC METABOLIC PANEL
Anion gap: 8 (ref 5–15)
BUN: 32 mg/dL — ABNORMAL HIGH (ref 8–23)
CO2: 28 mmol/L (ref 22–32)
Calcium: 9.2 mg/dL (ref 8.9–10.3)
Chloride: 100 mmol/L (ref 98–111)
Creatinine, Ser: 0.97 mg/dL (ref 0.44–1.00)
GFR, Estimated: >60 mL/min (ref >60)
Glucose, Bld: 83 mg/dL (ref 70–99)
Potassium: 4.3 mmol/L (ref 3.5–5.1)
Sodium: 136 mmol/L (ref 135–145)

## 2021-07-27 ENCOUNTER — Ambulatory Visit: Payer: Medicare Other | Admitting: Podiatry

## 2021-08-03 ENCOUNTER — Ambulatory Visit (INDEPENDENT_AMBULATORY_CARE_PROVIDER_SITE_OTHER): Payer: Medicare Other | Admitting: Podiatry

## 2021-08-03 ENCOUNTER — Other Ambulatory Visit: Payer: Self-pay

## 2021-08-03 DIAGNOSIS — E1159 Type 2 diabetes mellitus with other circulatory complications: Secondary | ICD-10-CM | POA: Diagnosis not present

## 2021-08-03 DIAGNOSIS — I739 Peripheral vascular disease, unspecified: Secondary | ICD-10-CM | POA: Diagnosis not present

## 2021-08-03 DIAGNOSIS — L97511 Non-pressure chronic ulcer of other part of right foot limited to breakdown of skin: Secondary | ICD-10-CM

## 2021-08-03 DIAGNOSIS — Z794 Long term (current) use of insulin: Secondary | ICD-10-CM | POA: Diagnosis not present

## 2021-08-03 NOTE — Progress Notes (Signed)
  Subjective:  Patient ID: Cindy Fischer, female    DOB: 1946-10-27,  MRN: [id number]  Chief Complaint  Patient presents with   Foot Ulcer      WOUND CARE right foot    75 y.o. female returns with the above complaint. History confirmed with patient.    Objective:  Physical Exam: blanching with elevation noted, dependent rubor present, DP reduced right, ulceration present at right hallux, normal sensory exam and PT absent right.  Foot is warm and well-perfused.  All ulcerations have healed  Assessment:   1. 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 Peripheral arterial disease ([place])   3. Type 2 diabetes mellitus with other circulatory complication, with long-term current use of insulin ([place])        Plan:  Patient was evaluated and treated and all questions answered.  Ulcer right foot -She is doing very well and all ulcers have healed now.  Return to see me as needed.  We discussed recurrence of ulceration she will see me ASAP if these develop.       Return if symptoms worsen or fail to improve.

## 2021-08-25 ENCOUNTER — Ambulatory Visit: Admit: 2021-08-25 | Discharge: 2021-08-26 | Payer: MEDICARE

## 2021-08-25 ENCOUNTER — Other Ambulatory Visit
Admission: RE | Admit: 2021-08-25 | Discharge: 2021-08-25 | Disposition: A | Discharge status: Home / Self Care | Payer: Medicare Other | Attending: Nephrology | Admitting: Nephrology

## 2021-08-25 ENCOUNTER — Other Ambulatory Visit: Payer: Self-pay

## 2021-08-25 DIAGNOSIS — Z09 Encounter for follow-up examination after completed treatment for conditions other than malignant neoplasm: Secondary | ICD-10-CM | POA: Insufficient documentation

## 2021-08-25 DIAGNOSIS — D899 Disorder involving the immune mechanism, unspecified: Secondary | ICD-10-CM | POA: Diagnosis not present

## 2021-08-25 DIAGNOSIS — N189 Chronic kidney disease, unspecified: Secondary | ICD-10-CM | POA: Insufficient documentation

## 2021-08-25 DIAGNOSIS — E1129 Type 2 diabetes mellitus with other diabetic kidney complication: Secondary | ICD-10-CM | POA: Diagnosis not present

## 2021-08-25 DIAGNOSIS — D631 Anemia in chronic kidney disease: Secondary | ICD-10-CM | POA: Diagnosis not present

## 2021-08-25 DIAGNOSIS — N390 Urinary tract infection, site not specified: Secondary | ICD-10-CM | POA: Insufficient documentation

## 2021-08-25 DIAGNOSIS — Z79899 Other long term (current) drug therapy: Secondary | ICD-10-CM | POA: Diagnosis not present

## 2021-08-25 DIAGNOSIS — Z940 Kidney transplant status: Secondary | ICD-10-CM | POA: Diagnosis not present

## 2021-08-25 DIAGNOSIS — Z789 Other specified health status: Secondary | ICD-10-CM | POA: Insufficient documentation

## 2021-08-25 DIAGNOSIS — E1122 Type 2 diabetes mellitus with diabetic chronic kidney disease: Secondary | ICD-10-CM | POA: Insufficient documentation

## 2021-08-25 LAB — CBC WITH DIFFERENTIAL/PLATELET
Abs Immature Granulocytes: 0.03 10*3/uL (ref 0.00–0.07)
Basophils Absolute: 0 10*3/uL (ref 0.0–0.1)
Basophils Relative: 0 %
Eosinophils Absolute: 0.1 10*3/uL (ref 0.0–0.5)
Eosinophils Relative: 1 %
HCT: 42.8 % (ref 36.0–46.0)
Hemoglobin: 14.2 g/dL (ref 12.0–15.0)
Immature Granulocytes: 0 %
Lymphocytes Relative: 28 %
Lymphs Abs: 2.1 10*3/uL (ref 0.7–4.0)
MCH: 27.7 pg (ref 26.0–34.0)
MCHC: 33.2 g/dL (ref 30.0–36.0)
MCV: 83.6 fL (ref 80.0–100.0)
Monocytes Absolute: 1.1 10*3/uL — ABNORMAL HIGH (ref 0.1–1.0)
Monocytes Relative: 15 %
Neutro Abs: 4.1 10*3/uL (ref 1.7–7.7)
Neutrophils Relative %: 56 %
Platelets: 528 10*3/uL — ABNORMAL HIGH (ref 150–400)
RBC: 5.12 MIL/uL — ABNORMAL HIGH (ref 3.87–5.11)
RDW: 17.6 % — ABNORMAL HIGH (ref 11.5–15.5)
WBC: 7.4 10*3/uL (ref 4.0–10.5)
nRBC: 0 % (ref 0.0–0.2)

## 2021-08-25 LAB — BASIC METABOLIC PANEL
Anion gap: 8 (ref 5–15)
BUN: 36 mg/dL — ABNORMAL HIGH (ref 8–23)
CO2: 28 mmol/L (ref 22–32)
Calcium: 8.9 mg/dL (ref 8.9–10.3)
Chloride: 98 mmol/L (ref 98–111)
Creatinine, Ser: 0.98 mg/dL (ref 0.44–1.00)
GFR, Estimated: >60 mL/min (ref >60)
Glucose, Bld: 90 mg/dL (ref 70–99)
Potassium: 4 mmol/L (ref 3.5–5.1)
Sodium: 134 mmol/L — ABNORMAL LOW (ref 135–145)

## 2021-08-25 LAB — MAGNESIUM: Magnesium: 2.2 mg/dL (ref 1.7–2.4)

## 2021-08-25 LAB — PHOSPHORUS: Phosphorus: 3.5 mg/dL (ref 2.5–4.6)

## 2021-08-27 DIAGNOSIS — E669 Obesity, unspecified: Principal | ICD-10-CM

## 2021-08-27 DIAGNOSIS — E1169 Type 2 diabetes mellitus with other specified complication: Principal | ICD-10-CM

## 2021-08-27 MED ORDER — LISINOPRIL 10 MG TABLET
ORAL_TABLET | 4 refills | 0 days | Status: CP
Start: 2021-08-27 — End: ?

## 2021-09-26 ENCOUNTER — Ambulatory Visit: Admit: 2021-09-26 | Payer: MEDICARE

## 2021-09-26 ENCOUNTER — Other Ambulatory Visit: Payer: Self-pay

## 2021-09-26 ENCOUNTER — Other Ambulatory Visit
Admission: RE | Admit: 2021-09-26 | Discharge: 2021-09-26 | Disposition: A | Discharge status: Home / Self Care | Payer: Medicare Other | Attending: Nephrology | Admitting: Nephrology

## 2021-09-26 DIAGNOSIS — Z114 Encounter for screening for human immunodeficiency virus [HIV]: Secondary | ICD-10-CM | POA: Diagnosis not present

## 2021-09-26 DIAGNOSIS — Z9483 Pancreas transplant status: Secondary | ICD-10-CM | POA: Diagnosis not present

## 2021-09-26 DIAGNOSIS — D631 Anemia in chronic kidney disease: Secondary | ICD-10-CM | POA: Insufficient documentation

## 2021-09-26 DIAGNOSIS — Z940 Kidney transplant status: Secondary | ICD-10-CM | POA: Diagnosis not present

## 2021-09-26 DIAGNOSIS — D899 Disorder involving the immune mechanism, unspecified: Secondary | ICD-10-CM | POA: Insufficient documentation

## 2021-09-26 DIAGNOSIS — E1122 Type 2 diabetes mellitus with diabetic chronic kidney disease: Secondary | ICD-10-CM | POA: Insufficient documentation

## 2021-09-26 DIAGNOSIS — N189 Chronic kidney disease, unspecified: Secondary | ICD-10-CM | POA: Insufficient documentation

## 2021-09-26 DIAGNOSIS — Z09 Encounter for follow-up examination after completed treatment for conditions other than malignant neoplasm: Secondary | ICD-10-CM | POA: Insufficient documentation

## 2021-09-26 LAB — CBC WITH DIFFERENTIAL/PLATELET
Abs Immature Granulocytes: 0.08 10*3/uL — ABNORMAL HIGH (ref 0.00–0.07)
Basophils Absolute: 0 10*3/uL (ref 0.0–0.1)
Basophils Relative: 1 %
Eosinophils Absolute: 0.1 10*3/uL (ref 0.0–0.5)
Eosinophils Relative: 2 %
HCT: 43 % (ref 36.0–46.0)
Hemoglobin: 14.6 g/dL (ref 12.0–15.0)
Immature Granulocytes: 1 %
Lymphocytes Relative: 22 %
Lymphs Abs: 1.4 10*3/uL (ref 0.7–4.0)
MCH: 27.8 pg (ref 26.0–34.0)
MCHC: 34 g/dL (ref 30.0–36.0)
MCV: 81.9 fL (ref 80.0–100.0)
Monocytes Absolute: 1.1 10*3/uL — ABNORMAL HIGH (ref 0.1–1.0)
Monocytes Relative: 16 %
Neutro Abs: 3.8 10*3/uL (ref 1.7–7.7)
Neutrophils Relative %: 58 %
Platelets: 576 10*3/uL — ABNORMAL HIGH (ref 150–400)
RBC: 5.25 MIL/uL — ABNORMAL HIGH (ref 3.87–5.11)
RDW: 17.9 % — ABNORMAL HIGH (ref 11.5–15.5)
WBC: 6.5 10*3/uL (ref 4.0–10.5)
nRBC: 0 % (ref 0.0–0.2)

## 2021-09-26 LAB — BASIC METABOLIC PANEL
Anion gap: 5 (ref 5–15)
BUN: 27 mg/dL — ABNORMAL HIGH (ref 8–23)
CO2: 28 mmol/L (ref 22–32)
Calcium: 9.2 mg/dL (ref 8.9–10.3)
Chloride: 100 mmol/L (ref 98–111)
Creatinine, Ser: 0.99 mg/dL (ref 0.44–1.00)
GFR, Estimated: 59 mL/min — ABNORMAL LOW (ref >60)
Glucose, Bld: 106 mg/dL — ABNORMAL HIGH (ref 70–99)
Potassium: 4.3 mmol/L (ref 3.5–5.1)
Sodium: 133 mmol/L — ABNORMAL LOW (ref 135–145)

## 2021-09-26 LAB — MAGNESIUM: Magnesium: 2.2 mg/dL (ref 1.7–2.4)

## 2021-09-26 LAB — PHOSPHORUS: Phosphorus: 3.2 mg/dL (ref 2.5–4.6)

## 2021-09-30 DIAGNOSIS — Z940 Kidney transplant status: Principal | ICD-10-CM

## 2021-09-30 DIAGNOSIS — Z48298 Encounter for aftercare following other organ transplant: Principal | ICD-10-CM

## 2021-10-02 DIAGNOSIS — Z940 Kidney transplant status: Principal | ICD-10-CM

## 2021-10-03 MED ORDER — EVEROLIMUS (IMMUNOSUPPRESSIVE) 0.5 MG TABLET
ORAL_TABLET | Freq: Two times a day (BID) | ORAL | 11 refills | 30 days | Status: CP
Start: 2021-10-03 — End: 2022-10-03

## 2021-10-19 ENCOUNTER — Other Ambulatory Visit (INDEPENDENT_AMBULATORY_CARE_PROVIDER_SITE_OTHER): Payer: Self-pay | Admitting: Vascular Surgery

## 2021-10-19 DIAGNOSIS — I70213 Atherosclerosis of native arteries of extremities with intermittent claudication, bilateral legs: Secondary | ICD-10-CM

## 2021-10-19 DIAGNOSIS — I70201 Unspecified atherosclerosis of native arteries of extremities, right leg: Secondary | ICD-10-CM

## 2021-10-20 ENCOUNTER — Other Ambulatory Visit: Payer: Self-pay

## 2021-10-20 ENCOUNTER — Encounter (INDEPENDENT_AMBULATORY_CARE_PROVIDER_SITE_OTHER): Payer: Self-pay | Admitting: Vascular Surgery

## 2021-10-20 ENCOUNTER — Ambulatory Visit (INDEPENDENT_AMBULATORY_CARE_PROVIDER_SITE_OTHER): Payer: Medicare Other

## 2021-10-20 ENCOUNTER — Ambulatory Visit (INDEPENDENT_AMBULATORY_CARE_PROVIDER_SITE_OTHER): Payer: Medicare Other | Admitting: Vascular Surgery

## 2021-10-20 VITALS — BP 126/73 | HR 139 | Ht 62.0 in | Wt 153.0 lb

## 2021-10-20 DIAGNOSIS — E1159 Type 2 diabetes mellitus with other circulatory complications: Secondary | ICD-10-CM

## 2021-10-20 DIAGNOSIS — E782 Mixed hyperlipidemia: Secondary | ICD-10-CM | POA: Diagnosis not present

## 2021-10-20 DIAGNOSIS — I70213 Atherosclerosis of native arteries of extremities with intermittent claudication, bilateral legs: Secondary | ICD-10-CM

## 2021-10-20 DIAGNOSIS — Z794 Long term (current) use of insulin: Secondary | ICD-10-CM

## 2021-10-20 DIAGNOSIS — I70201 Unspecified atherosclerosis of native arteries of extremities, right leg: Secondary | ICD-10-CM | POA: Diagnosis not present

## 2021-10-20 DIAGNOSIS — I10 Essential (primary) hypertension: Secondary | ICD-10-CM | POA: Diagnosis not present

## 2021-10-20 DIAGNOSIS — K219 Gastro-esophageal reflux disease without esophagitis: Secondary | ICD-10-CM

## 2021-10-20 NOTE — Progress Notes (Signed)
MRN : [id number]  Cindy Fischer is a 75 y.o. (1946-01-27) female who presents with chief complaint of check legs.  History of Present Illness:   The patient returns to the office for followup and review of the noninvasive studies. There have been no interval changes in lower extremity symptoms. No interval shortening of the patient's claudication distance or development of rest pain symptoms. No new ulcers or wounds have occurred since the last visit.   Since her last visit she has had a subdural hematoma and several episodes of hypoglycemia.  These are currently being treated.   The patient denies amaurosis fugax or recent TIA symptoms. There are no recent neurological changes noted. The patient denies history of DVT, PE or superficial thrombophlebitis. The patient denies recent episodes of angina or shortness of breath.    ABI Rt=[place] (TBI=0.79) and Lt=[place] (TBI=0.83)  (previous ABI's Rt=[place] (TBI=0.92) and Lt=[place] (TBI=1.01))  Aorta iliac duplex shows mild to moderate stenosis of the proximal right common iliac artery stent with widely patent left common iliac artery stent.    Right lower extremity arterial duplex shows uniform velocities no hemodynamically significant stenosis.  Current Meds  Medication Sig   acetaminophen (TYLENOL) 650 MG CR tablet Take 1,300 mg by mouth every 8 (eight) hours as needed for pain.   aspirin EC 81 MG tablet Take 81 mg by mouth daily.   atorvastatin (LIPITOR) 20 MG tablet Take 20 mg by mouth daily.   Cholecalciferol (VITAMIN D) 2000 units tablet Take 2,000 Units by mouth daily.   clopidogrel (PLAVIX) 75 MG tablet TAKE 1 TABLET BY MOUTH EVERY DAY   Everolimus 0.5 MG TABS Take 1 mg by mouth 2 (two) times daily.   insulin NPH Human (HUMULIN N,NOVOLIN N) 100 UNIT/ML injection Inject 30 Units into the skin daily before breakfast.   insulin regular (NOVOLIN R,HUMULIN R) 100 units/mL injection Inject 3-10 Units into the skin 3 (three) times daily as needed for  high blood sugar. Sliding scale   levothyroxine (SYNTHROID, LEVOTHROID) 100 MCG tablet Take 100 mcg by mouth See admin instructions. Take 100 mcg daily except skip dose on Saturdays   lisinopril (PRINIVIL,ZESTRIL) 10 MG tablet Take 10 mg by mouth daily.   omeprazole (PRILOSEC) 40 MG capsule Take 40 mg by mouth every other day.    Polyethyl Glycol-Propyl Glycol (SYSTANE ULTRA OP) Place 1 drop into both eyes daily as needed (Dry eyes).   predniSONE (DELTASONE) 5 MG tablet Take 5 mg by mouth daily.    tacrolimus (PROGRAF) 0.5 MG capsule Take 1-1.5 mg by mouth See admin instructions. Take 1.5 mg in the morning and 1 mg in the evening   torsemide (DEMADEX) 20 MG tablet Take 20 mg by mouth daily as needed.   [DISCONTINUED] Everolimus 0.5 MG TABS Take by mouth.   [DISCONTINUED] lisinopril (ZESTRIL) 10 MG tablet Take 1 tablet by mouth daily.    Past Medical History:  Diagnosis Date   Cancer ([place]) 2016   skin (nose)   Diabetes mellitus without complication (HCC)    End stage renal disease (HCC)    s/p renal transplant   Hypothyroidism    Pneumonia    Renal disorder     Past Surgical History:  Procedure Laterality Date   CATARACT EXTRACTION, BILATERAL     COLONOSCOPY  12/02/2010   COLONOSCOPY WITH PROPOFOL N/A 09/04/2016   Procedure: COLONOSCOPY WITH PROPOFOL;  Surgeon: Christene Lye, MD;  Location: [facility] END[facility]COPY;  Service: Endoscopy;  Laterality: N/A;  EYE SURGERY     INSERTION OF DIALYSIS CATHETER  2014   removed 2014 (only had in for about 8 weeks)   laser surgery on eyes  1999   LOWER EXTREMITY ANGIOGRAPHY Right 10/16/2018   Procedure: LOWER EXTREMITY ANGIOGRAPHY;  Surgeon: Katha Cabal, MD;  Location: [facility];  Service: Cardiovascular;  Laterality: Right;   LOWER EXTREMITY ANGIOGRAPHY Right 02/08/2021   Procedure: LOWER EXTREMITY ANGIOGRAPHY;  Surgeon: Katha Cabal, MD;  Location: [facility];  Service: Cardiovascular;  Laterality: Right;    LOWER EXTREMITY ANGIOGRAPHY Right 03/02/2021   Procedure: LOWER EXTREMITY ANGIOGRAPHY;  Surgeon: Katha Cabal, MD;  Location: [facility];  Service: Cardiovascular;  Laterality: Right;   NEPHRECTOMY TRANSPLANTED ORGAN  10/20/2014   SPINE SURGERY  11/10/2021   TONSILLECTOMY     TUBAL LIGATION      Social History Social History   Tobacco Use   Smoking status: Former    Packs/day: 1.00    Years: 35.00    Pack years: 35.00    Types: Cigarettes    Quit date: 2015    Years since quitting: 7.8   Smokeless tobacco: Never  Vaping Use   Vaping Use: Never used  Substance Use Topics   Alcohol use: No   Drug use: No    Family History Family History  Problem Relation Age of Onset   Multiple myeloma Mother    Diabetes Father    Heart attack Father 87   Breast cancer Neg Hx     Allergies  Allergen Reactions   Latex Itching   Voltaren [Diclofenac Sodium] Diarrhea and Nausea And Vomiting   Oxycodone-Acetaminophen Nausea And Vomiting   Penicillins Rash    Has patient had a PCN reaction causing immediate rash, facial/tongue/throat swelling, SOB or lightheadedness with hypotension: Yes Has patient had a PCN reaction causing severe rash involving mucus membranes or skin necrosis: No Has patient had a PCN reaction that required hospitalization: No Has patient had a PCN reaction occurring within the last 10 years: No If all of the above answers are "NO", then may proceed with Cephalosporin use.      REVIEW OF SYSTEMS (Negative unless checked)  Constitutional: '[]' Weight loss  '[]' Fever  '[]' Chills Cardiac: '[]' Chest pain   '[]' Chest pressure   '[]' Palpitations   '[]' Shortness of breath when laying flat   '[]' Shortness of breath with exertion. Vascular:  '[]' Pain in legs with walking   '[]' Pain in legs at rest  '[]' History of DVT   '[]' Phlebitis   '[]' Swelling in legs   '[]' Varicose veins   '[]' Non-healing ulcers Pulmonary:   '[]' Uses home oxygen   '[]' Productive cough   '[]' Hemoptysis   '[]' Wheeze  '[]' COPD    '[]' Asthma Neurologic:  '[]' Dizziness   '[]' Seizures   '[]' History of stroke   '[]' History of TIA  '[]' Aphasia   '[]' Vissual changes   '[]' Weakness or numbness in arm   '[]' Weakness or numbness in leg Musculoskeletal:   '[]' Joint swelling   '[]' Joint pain   '[]' Low back pain Hematologic:  '[]' Easy bruising  '[]' Easy bleeding   '[]' Hypercoagulable state   '[]' Anemic Gastrointestinal:  '[]' Diarrhea   '[]' Vomiting  '[x]' Gastroesophageal reflux/heartburn   '[]' Difficulty swallowing. Genitourinary:  '[]' Chronic kidney disease   '[]' Difficult urination  '[]' Frequent urination   '[]' Blood in urine Skin:  '[]' Rashes   '[]' Ulcers  Psychological:  '[]' History of anxiety   '[]'  History of major depression.  Physical Examination  Vitals:   10/20/21 0943  BP: 126/73  Pulse: (!) 139  Weight: 153  lb (69.4 kg)  Height: '5\' 2"'  (1.575 m)   Body mass index is 27.98 kg/m. Gen: WD/WN, NAD Head: [place]/AT, No temporalis wasting.  Ear/Nose/Throat: Hearing grossly intact, nares w/o erythema or drainage Eyes: PER, EOMI, sclera nonicteric.  Neck: Supple, no masses.  No bruit or JVD.  Pulmonary:  Good air movement, no audible wheezing, no use of accessory muscles.  Cardiac: RRR, normal S1, S2, no Murmurs. Vascular:   Vessel Right Left  Radial Palpable Palpable  PT Not Palpable Trace Palpable  DP Trace Palpable Not Palpable  Gastrointestinal: soft, non-distended. No guarding/no peritoneal signs.  Musculoskeletal: M/S 5/5 throughout.  No visible deformity.  Neurologic: CN 2-12 intact. Pain and light touch intact in extremities.  Symmetrical.  Speech is fluent. Motor exam as listed above. Psychiatric: Judgment intact, Mood & affect appropriate for pt's clinical situation. Dermatologic: No rashes or ulcers noted.  No changes consistent with cellulitis.   CBC Lab Results  Component Value Date   WBC 6.5 09/26/2021   HGB 14.6 09/26/2021   HCT 43.0 09/26/2021   MCV 81.9 09/26/2021   PLT 576 (H) 09/26/2021    BMET    Component Value Date/Time   NA 133 (L)  09/26/2021 0840   NA 138 03/29/2015 0918   K 4.3 09/26/2021 0840   K 4.5 03/29/2015 0918   CL 100 09/26/2021 0840   CL 104 03/29/2015 0918   CO2 28 09/26/2021 0840   CO2 26 03/29/2015 0918   GLUCOSE 106 (H) 09/26/2021 0840   GLUCOSE 187 (H) 03/29/2015 0918   BUN 27 (H) 09/26/2021 0840   BUN 10 03/29/2015 0918   CREATININE 0.99 09/26/2021 0840   CREATININE 0.80 03/29/2015 0918   CALCIUM 9.2 09/26/2021 0840   CALCIUM 9.1 03/29/2015 0918   GFRNONAA 59 (L) 09/26/2021 0840   GFRNONAA >60 03/29/2015 0918   GFRAA >60 09/24/2020 0836   GFRAA >60 03/29/2015 0918   CrCl cannot be calculated (Patient's most recent lab result is older than the maximum 21 days allowed.).  COAG No results found for: INR, PROTIME  Radiology VAS [place] ABI WITH/WO TBI  Result Date: 10/20/2021  LOWER EXTREMITY DOPPLER STUDY Patient Name:  Cindy Fischer  Date of Exam:   10/20/2021 Medical Rec #: [id number]      Accession #:    [id number] Date of Birth: [birth year]     Patient Gender: F Patient Age:   67 years Exam Location:  [facility] Procedure:      VAS [place] ABI WITH/WO TBI Referring Phys: Hortencia Pilar --------------------------------------------------------------------------------  Indications: Claudication, and peripheral artery disease.  Vascular Interventions: 10/16/2018:PTA and Stent placement in the Rt SFA and                         Popliteal Artery. Comparison Study: 06/23/2021 Performing Technologist: Almira Coaster RVS  Examination Guidelines: A complete evaluation includes at minimum, Doppler waveform signals and systolic blood pressure reading at the level of bilateral brachial, anterior tibial, and posterior tibial arteries, when vessel segments are accessible. Bilateral testing is considered an integral part of a complete examination. Photoelectric Plethysmograph (PPG) waveforms and toe systolic pressure readings are included as required and additional duplex testing as needed. Limited  examinations for reoccurring indications may be performed as noted.  ABI Findings: +---------+------------------+-----+---------+--------+ Right    Rt Pressure (mmHg)IndexWaveform Comment  +---------+------------------+-----+---------+--------+ ATA      229  triphasic>1.0 [place]  +---------+------------------+-----+---------+--------+ PTA      235               1.68 triphasic>1.0 [place]  +---------+------------------+-----+---------+--------+ Great Toe110               0.79 Normal            +---------+------------------+-----+---------+--------+ +---------+------------------+-----+---------+-------+ Left     Lt Pressure (mmHg)IndexWaveform Comment +---------+------------------+-----+---------+-------+ Brachial 140                                     +---------+------------------+-----+---------+-------+ ATA      231                    triphasic>1.0 [place] +---------+------------------+-----+---------+-------+ PTA      238               1.70 triphasic>1.0 [place] +---------+------------------+-----+---------+-------+ Great Toe116               0.83 Normal           +---------+------------------+-----+---------+-------+ +-------+-----------+-----------+------------+------------+ ABI/TBIToday's ABIToday's TBIPrevious ABIPrevious TBI +-------+-----------+-----------+------------+------------+ Right  >1.0 [place]    .79        >1.0 [place]     .92          +-------+-----------+-----------+------------+------------+ Left   >1.0 [place]    .83        >1.0 [place]     1.01         +-------+-----------+-----------+------------+------------+  Bilateral ABIs appear essentially unchanged compared to prior study on 06/23/2021. Bilateral TBIs appear decreased compared to prior study on 06/23/2021.  Summary: Right: Resting right ankle-brachial index indicates noncompressible right lower extremity arteries. The right toe-brachial index is normal. Left: Resting left  ankle-brachial index indicates noncompressible left lower extremity arteries. The left toe-brachial index is normal.  *See table(s) above for measurements and observations.  Electronically signed by Hortencia Pilar MD on 10/20/2021 at 12:59:56 PM.    Final    VAS [place] LOWER EXTREMITY ARTERIAL DUPLEX  Result Date: 10/20/2021 LOWER EXTREMITY ARTERIAL DUPLEX STUDY Patient Name:  Cindy Fischer  Date of Exam:   10/20/2021 Medical Rec #: [id number]      Accession #:    [id number] Date of Birth: 05-Nov-1946     Patient Gender: F Patient Age:   72 years Exam Location:  [facility] Procedure:      VAS [place] LOWER EXTREMITY ARTERIAL DUPLEX Referring Phys: Hortencia Pilar --------------------------------------------------------------------------------  Indications: Claudication, and peripheral artery disease.  Vascular Interventions: 10/16/2018: PTA and Stent of the Right SFA and Popliteal                         Artery. Current ABI:            Rt >1.0 [place], Lt >1.0  Comparison Study: 06/23/2021 Performing Technologist: Almira Coaster RVS  Examination Guidelines: A complete evaluation includes B-mode imaging, spectral Doppler, color Doppler, and power Doppler as needed of all accessible portions of each vessel. Bilateral testing is considered an integral part of a complete examination. Limited examinations for reoccurring indications may be performed as noted.  +-----------+--------+-----+--------+---------+--------+ RIGHT      PSV cm/sRatioStenosisWaveform Comments +-----------+--------+-----+--------+---------+--------+ CFA Distal 52                   biphasic          +-----------+--------+-----+--------+---------+--------+ DFA  37                   biphasic          +-----------+--------+-----+--------+---------+--------+ SFA Prox   79                   triphasic         +-----------+--------+-----+--------+---------+--------+ SFA Mid    69                   triphasic          +-----------+--------+-----+--------+---------+--------+ SFA Distal 44                   biphasic          +-----------+--------+-----+--------+---------+--------+ POP Distal 85                   triphasic         +-----------+--------+-----+--------+---------+--------+ ATA Distal 62                   triphasic         +-----------+--------+-----+--------+---------+--------+ PTA Distal 77                   triphasic         +-----------+--------+-----+--------+---------+--------+ PERO Distal21                   biphasic          +-----------+--------+-----+--------+---------+--------+  Summary: Right: Imaging and Waveforms obtained throughout in the Right Lower Extremity. Triphasic Waveforms predominantly seen in the Right Lower Extremity.  See table(s) above for measurements and observations. Electronically signed by Hortencia Pilar MD on 10/20/2021 at 1:00:07 PM.    Final    VAS US AORTA/IVC/ILIACS  Result Date: 10/20/2021 ABDOMINAL AORTA STUDY Patient Name:  Cindy Fischer  Date of Exam:   10/20/2021 Medical Rec #: [id number]      Accession #:    [id number] Date of Birth: 02/24/1946     Patient Gender: F Patient Age:   40 years Exam Location:  [facility] Procedure:      VAS US AORTA/IVC/ILIACS Referring Phys: Hortencia Pilar --------------------------------------------------------------------------------  Performing Technologist: Almira Coaster RVS  Examination Guidelines: A complete evaluation includes B-mode imaging, spectral Doppler, color Doppler, and power Doppler as needed of all accessible portions of each vessel. Bilateral testing is considered an integral part of a complete examination. Limited examinations for reoccurring indications may be performed as noted.  Abdominal Aorta Findings: +-------------+-------+----------+----------+----------+--------+--------+ Location     AP (cm)Trans (cm)PSV (cm/s)Waveform  ThrombusComments  +-------------+-------+----------+----------+----------+--------+--------+ Proximal     1.87   1.81      70        monophasic                 +-------------+-------+----------+----------+----------+--------+--------+ Mid          1.99   1.68      79        monophasic                 +-------------+-------+----------+----------+----------+--------+--------+ Distal       2.27   2.34      59        monophasic                 +-------------+-------+----------+----------+----------+--------+--------+ RT CIA Prox  0.9    0.9       276       biphasic                   +-------------+-------+----------+----------+----------+--------+--------+  RT CIA Distal0.7    0.8       167       biphasic                   +-------------+-------+----------+----------+----------+--------+--------+ RT EIA Prox  0.8    0.8       77        biphasic                   +-------------+-------+----------+----------+----------+--------+--------+ RT EIA Distal0.8    0.8       68        biphasic                   +-------------+-------+----------+----------+----------+--------+--------+ LT CIA Prox  0.9    0.9       147       biphasic                   +-------------+-------+----------+----------+----------+--------+--------+ LT CIA Distal0.8    0.9       99        biphasic                   +-------------+-------+----------+----------+----------+--------+--------+ LT EIA Prox  0.7    0.7       89        biphasic                   +-------------+-------+----------+----------+----------+--------+--------+ LT EIA Distal0.7    0.7       74        biphasic                   +-------------+-------+----------+----------+----------+--------+--------+  Summary: Abdominal Aorta: No evidence of an abdominal aortic aneurysm was visualized. The largest aortic measurement is 2.3 cm. Stenosis: Elevated Velocities seen in the Right CIA.  *See table(s) above for measurements and  observations.  Electronically signed by Hortencia Pilar MD on 10/20/2021 at 1:00:26 PM.    Final      Assessment/Plan 1. Atherosclerosis of native artery of both lower extremities with intermittent claudication (HCC)  Recommend:  The patient has evidence of atherosclerosis of the lower extremities with claudication.  The patient does not voice lifestyle limiting changes at this point in time.  Noninvasive studies do not suggest clinically significant change.  No invasive studies, angiography or surgery at this time The patient should continue walking and begin a more formal exercise program.  The patient should continue antiplatelet therapy and aggressive treatment of the lipid abnormalities  No changes in the patient's medications at this time   A total of 35 minutes was spent with this patient and greater than 50% was spent in counseling and coordination of care with the patient.  Discussion included the treatment options for vascular disease including indications for surgery and intervention.  Also discussed is the appropriate timing of treatment.  In addition medical therapy was discussed.    - VAS [place] LOWER EXTREMITY ARTERIAL DUPLEX; Future - VAS [place] ABI WITH/WO TBI; Future  2. Primary hypertension Continue antihypertensive medications as already ordered, these medications have been reviewed and there are no changes at this time.   3. Type 2 diabetes mellitus with other circulatory complication, with long-term current use of insulin (HCC) Continue hypoglycemic medications as already ordered, these medications have been reviewed and there are no changes at this time.  Hgb A1C to be monitored as already arranged by primary service   4. Mixed hyperlipidemia  Continue statin as ordered and reviewed, no changes at this time   5. Gastroesophageal reflux disease without esophagitis Continue PPI as already ordered, this medication has been reviewed and there are no changes at this  time.  Avoidence of caffeine and alcohol  Moderate elevation of the head of the bed      Hortencia Pilar, MD  10/20/2021 7:10 PM

## 2021-10-27 ENCOUNTER — Other Ambulatory Visit
Admission: RE | Admit: 2021-10-27 | Discharge: 2021-10-27 | Disposition: A | Discharge status: Home / Self Care | Payer: Medicare Other | Attending: Nephrology | Admitting: Nephrology

## 2021-10-27 DIAGNOSIS — D631 Anemia in chronic kidney disease: Secondary | ICD-10-CM | POA: Diagnosis not present

## 2021-10-27 DIAGNOSIS — Z9483 Pancreas transplant status: Secondary | ICD-10-CM | POA: Insufficient documentation

## 2021-10-27 DIAGNOSIS — Z09 Encounter for follow-up examination after completed treatment for conditions other than malignant neoplasm: Secondary | ICD-10-CM | POA: Diagnosis not present

## 2021-10-27 DIAGNOSIS — D899 Disorder involving the immune mechanism, unspecified: Secondary | ICD-10-CM | POA: Insufficient documentation

## 2021-10-27 DIAGNOSIS — Z940 Kidney transplant status: Secondary | ICD-10-CM | POA: Insufficient documentation

## 2021-10-27 DIAGNOSIS — B259 Cytomegaloviral disease, unspecified: Secondary | ICD-10-CM | POA: Insufficient documentation

## 2021-10-27 DIAGNOSIS — Z789 Other specified health status: Secondary | ICD-10-CM | POA: Diagnosis not present

## 2021-10-27 DIAGNOSIS — Z79899 Other long term (current) drug therapy: Secondary | ICD-10-CM | POA: Insufficient documentation

## 2021-10-27 DIAGNOSIS — E1129 Type 2 diabetes mellitus with other diabetic kidney complication: Secondary | ICD-10-CM | POA: Insufficient documentation

## 2021-10-27 DIAGNOSIS — N390 Urinary tract infection, site not specified: Secondary | ICD-10-CM | POA: Insufficient documentation

## 2021-10-27 DIAGNOSIS — N189 Chronic kidney disease, unspecified: Secondary | ICD-10-CM | POA: Diagnosis not present

## 2021-10-27 DIAGNOSIS — Z114 Encounter for screening for human immunodeficiency virus [HIV]: Secondary | ICD-10-CM | POA: Diagnosis not present

## 2021-10-27 DIAGNOSIS — E559 Vitamin D deficiency, unspecified: Secondary | ICD-10-CM | POA: Diagnosis not present

## 2021-10-27 LAB — PHOSPHORUS: Phosphorus: 3.7 mg/dL (ref 2.5–4.6)

## 2021-10-27 LAB — BASIC METABOLIC PANEL
Anion gap: 11 (ref 5–15)
BUN: 36 mg/dL — ABNORMAL HIGH (ref 8–23)
CO2: 23 mmol/L (ref 22–32)
Calcium: 8.9 mg/dL (ref 8.9–10.3)
Chloride: 97 mmol/L — ABNORMAL LOW (ref 98–111)
Creatinine, Ser: 1.04 mg/dL — ABNORMAL HIGH (ref 0.44–1.00)
GFR, Estimated: 56 mL/min — ABNORMAL LOW (ref >60)
Glucose, Bld: 90 mg/dL (ref 70–99)
Potassium: 3.7 mmol/L (ref 3.5–5.1)
Sodium: 131 mmol/L — ABNORMAL LOW (ref 135–145)

## 2021-10-27 LAB — MAGNESIUM: Magnesium: 2 mg/dL (ref 1.7–2.4)

## 2021-10-27 LAB — CBC WITH DIFFERENTIAL/PLATELET
Abs Immature Granulocytes: 0.06 10*3/uL (ref 0.00–0.07)
Basophils Absolute: 0 10*3/uL (ref 0.0–0.1)
Basophils Relative: 0 %
Eosinophils Absolute: 0.1 10*3/uL (ref 0.0–0.5)
Eosinophils Relative: 2 %
HCT: 38.9 % (ref 36.0–46.0)
Hemoglobin: 13.1 g/dL (ref 12.0–15.0)
Immature Granulocytes: 1 %
Lymphocytes Relative: 32 %
Lymphs Abs: 2 10*3/uL (ref 0.7–4.0)
MCH: 28.5 pg (ref 26.0–34.0)
MCHC: 33.7 g/dL (ref 30.0–36.0)
MCV: 84.7 fL (ref 80.0–100.0)
Monocytes Absolute: 1 10*3/uL (ref 0.1–1.0)
Monocytes Relative: 16 %
Neutro Abs: 3 10*3/uL (ref 1.7–7.7)
Neutrophils Relative %: 49 %
Platelets: 533 10*3/uL — ABNORMAL HIGH (ref 150–400)
RBC: 4.59 MIL/uL (ref 3.87–5.11)
RDW: 17.2 % — ABNORMAL HIGH (ref 11.5–15.5)
WBC: 6.2 10*3/uL (ref 4.0–10.5)
nRBC: 0 % (ref 0.0–0.2)

## 2021-10-28 ENCOUNTER — Other Ambulatory Visit: Admit: 2021-10-28 | Discharge: 2021-10-28 | Payer: MEDICARE

## 2021-11-01 DIAGNOSIS — Z940 Kidney transplant status: Principal | ICD-10-CM

## 2021-11-01 DIAGNOSIS — D849 Immunodeficiency, unspecified: Principal | ICD-10-CM

## 2021-11-26 ENCOUNTER — Other Ambulatory Visit: Payer: Self-pay

## 2021-11-26 ENCOUNTER — Ambulatory Visit
Admission: EM | Admit: 2021-11-26 | Discharge: 2021-11-26 | Disposition: A | Discharge status: Home / Self Care | Payer: Medicare Other | Attending: Family Medicine | Admitting: Family Medicine

## 2021-11-26 NOTE — ED Triage Notes (Signed)
Pt c/o fall x 2 weeks ago due to low blood sugar. Pt states her left shoulder has been sore since then and has progressively gotten worse. Has been taking tylenol 3x a day for pain. She has a kidney tx and has to avoid NSAIDs.

## 2021-12-09 ENCOUNTER — Ambulatory Visit: Admit: 2021-12-09 | Discharge: 2021-12-09 | Payer: MEDICARE

## 2021-12-09 ENCOUNTER — Ambulatory Visit: Admit: 2021-12-09 | Discharge: 2021-12-09 | Payer: MEDICARE | Attending: Nephrology | Primary: Nephrology

## 2021-12-09 DIAGNOSIS — Z940 Kidney transplant status: Principal | ICD-10-CM

## 2021-12-09 DIAGNOSIS — D849 Immunodeficiency, unspecified: Principal | ICD-10-CM

## 2021-12-09 DIAGNOSIS — E118 Type 2 diabetes mellitus with unspecified complications: Principal | ICD-10-CM

## 2021-12-09 DIAGNOSIS — E039 Hypothyroidism, unspecified: Principal | ICD-10-CM

## 2021-12-12 DIAGNOSIS — E118 Type 2 diabetes mellitus with unspecified complications: Principal | ICD-10-CM

## 2021-12-14 DIAGNOSIS — E10649 Type 1 diabetes mellitus with hypoglycemia without coma: Principal | ICD-10-CM

## 2021-12-27 ENCOUNTER — Other Ambulatory Visit
Admission: RE | Admit: 2021-12-27 | Discharge: 2021-12-27 | Disposition: A | Discharge status: Home / Self Care | Payer: Medicare Other | Attending: Nephrology | Admitting: Nephrology

## 2021-12-27 DIAGNOSIS — Z9483 Pancreas transplant status: Secondary | ICD-10-CM | POA: Diagnosis not present

## 2021-12-27 DIAGNOSIS — Z09 Encounter for follow-up examination after completed treatment for conditions other than malignant neoplasm: Secondary | ICD-10-CM | POA: Diagnosis present

## 2021-12-27 DIAGNOSIS — B259 Cytomegaloviral disease, unspecified: Secondary | ICD-10-CM | POA: Insufficient documentation

## 2021-12-27 DIAGNOSIS — D899 Disorder involving the immune mechanism, unspecified: Secondary | ICD-10-CM | POA: Diagnosis not present

## 2021-12-27 DIAGNOSIS — E1122 Type 2 diabetes mellitus with diabetic chronic kidney disease: Secondary | ICD-10-CM | POA: Insufficient documentation

## 2021-12-27 DIAGNOSIS — N189 Chronic kidney disease, unspecified: Secondary | ICD-10-CM | POA: Insufficient documentation

## 2021-12-27 DIAGNOSIS — Z940 Kidney transplant status: Secondary | ICD-10-CM | POA: Insufficient documentation

## 2021-12-27 DIAGNOSIS — Z79899 Other long term (current) drug therapy: Secondary | ICD-10-CM | POA: Insufficient documentation

## 2021-12-27 DIAGNOSIS — D631 Anemia in chronic kidney disease: Secondary | ICD-10-CM | POA: Insufficient documentation

## 2021-12-27 LAB — BASIC METABOLIC PANEL
Anion gap: 8 (ref 5–15)
BUN: 33 mg/dL — ABNORMAL HIGH (ref 8–23)
CO2: 25 mmol/L (ref 22–32)
Calcium: 9.2 mg/dL (ref 8.9–10.3)
Chloride: 101 mmol/L (ref 98–111)
Creatinine, Ser: 1.12 mg/dL — ABNORMAL HIGH (ref 0.44–1.00)
GFR, Estimated: 51 mL/min — ABNORMAL LOW (ref >60)
Glucose, Bld: 97 mg/dL (ref 70–99)
Potassium: 4.2 mmol/L (ref 3.5–5.1)
Sodium: 134 mmol/L — ABNORMAL LOW (ref 135–145)

## 2021-12-27 LAB — CBC WITH DIFFERENTIAL/PLATELET
Abs Immature Granulocytes: 0.05 10*3/uL (ref 0.00–0.07)
Basophils Absolute: 0 10*3/uL (ref 0.0–0.1)
Basophils Relative: 0 %
Eosinophils Absolute: 0.1 10*3/uL (ref 0.0–0.5)
Eosinophils Relative: 1 %
HCT: 43.4 % (ref 36.0–46.0)
Hemoglobin: 14.2 g/dL (ref 12.0–15.0)
Immature Granulocytes: 1 %
Lymphocytes Relative: 32 %
Lymphs Abs: 2.6 10*3/uL (ref 0.7–4.0)
MCH: 29.2 pg (ref 26.0–34.0)
MCHC: 32.7 g/dL (ref 30.0–36.0)
MCV: 89.3 fL (ref 80.0–100.0)
Monocytes Absolute: 1 10*3/uL (ref 0.1–1.0)
Monocytes Relative: 12 %
Neutro Abs: 4.4 10*3/uL (ref 1.7–7.7)
Neutrophils Relative %: 54 %
Platelets: 615 10*3/uL — ABNORMAL HIGH (ref 150–400)
RBC: 4.86 MIL/uL (ref 3.87–5.11)
RDW: 16.6 % — ABNORMAL HIGH (ref 11.5–15.5)
WBC: 8.2 10*3/uL (ref 4.0–10.5)
nRBC: 0 % (ref 0.0–0.2)

## 2021-12-27 LAB — PHOSPHORUS: Phosphorus: 3.9 mg/dL (ref 2.5–4.6)

## 2021-12-27 LAB — MAGNESIUM: Magnesium: 2.1 mg/dL (ref 1.7–2.4)

## 2022-01-04 ENCOUNTER — Ambulatory Visit
Admit: 2022-01-04 | Discharge: 2022-01-05 | Payer: MEDICARE | Attending: Student in an Organized Health Care Education/Training Program | Primary: Student in an Organized Health Care Education/Training Program

## 2022-01-04 ENCOUNTER — Other Ambulatory Visit: Payer: Self-pay | Admitting: Internal Medicine

## 2022-01-04 DIAGNOSIS — Z85828 Personal history of other malignant neoplasm of skin: Principal | ICD-10-CM

## 2022-01-04 DIAGNOSIS — D229 Melanocytic nevi, unspecified: Principal | ICD-10-CM

## 2022-01-04 DIAGNOSIS — Z940 Kidney transplant status: Principal | ICD-10-CM

## 2022-01-04 DIAGNOSIS — L821 Other seborrheic keratosis: Principal | ICD-10-CM

## 2022-01-04 DIAGNOSIS — L720 Epidermal cyst: Principal | ICD-10-CM

## 2022-01-04 DIAGNOSIS — L814 Other melanin hyperpigmentation: Principal | ICD-10-CM

## 2022-01-04 DIAGNOSIS — D1801 Hemangioma of skin and subcutaneous tissue: Principal | ICD-10-CM

## 2022-01-04 DIAGNOSIS — Z1231 Encounter for screening mammogram for malignant neoplasm of breast: Secondary | ICD-10-CM

## 2022-01-23 DIAGNOSIS — Z940 Kidney transplant status: Principal | ICD-10-CM

## 2022-01-23 DIAGNOSIS — Z79899 Other long term (current) drug therapy: Principal | ICD-10-CM

## 2022-01-25 ENCOUNTER — Other Ambulatory Visit (INDEPENDENT_AMBULATORY_CARE_PROVIDER_SITE_OTHER): Payer: Self-pay | Admitting: Vascular Surgery

## 2022-01-26 ENCOUNTER — Ambulatory Visit: Admit: 2022-01-26 | Discharge: 2022-01-26 | Payer: MEDICARE

## 2022-01-26 ENCOUNTER — Other Ambulatory Visit
Admission: RE | Admit: 2022-01-26 | Discharge: 2022-01-26 | Disposition: A | Discharge status: Home / Self Care | Payer: Medicare Other | Attending: Nephrology | Admitting: Nephrology

## 2022-01-26 ENCOUNTER — Inpatient Hospital Stay: Admission: RE | Admit: 2022-01-26 | Payer: Medicare Other | Source: Ambulatory Visit

## 2022-01-26 DIAGNOSIS — Z940 Kidney transplant status: Principal | ICD-10-CM

## 2022-01-26 DIAGNOSIS — E559 Vitamin D deficiency, unspecified: Secondary | ICD-10-CM | POA: Diagnosis not present

## 2022-01-26 DIAGNOSIS — Z79899 Other long term (current) drug therapy: Secondary | ICD-10-CM | POA: Diagnosis not present

## 2022-01-26 DIAGNOSIS — N390 Urinary tract infection, site not specified: Secondary | ICD-10-CM | POA: Diagnosis not present

## 2022-01-26 DIAGNOSIS — Z114 Encounter for screening for human immunodeficiency virus [HIV]: Secondary | ICD-10-CM | POA: Diagnosis not present

## 2022-01-26 DIAGNOSIS — D631 Anemia in chronic kidney disease: Secondary | ICD-10-CM | POA: Insufficient documentation

## 2022-01-26 DIAGNOSIS — Z09 Encounter for follow-up examination after completed treatment for conditions other than malignant neoplasm: Secondary | ICD-10-CM | POA: Insufficient documentation

## 2022-01-26 DIAGNOSIS — B259 Cytomegaloviral disease, unspecified: Secondary | ICD-10-CM | POA: Insufficient documentation

## 2022-01-26 DIAGNOSIS — N189 Chronic kidney disease, unspecified: Secondary | ICD-10-CM | POA: Insufficient documentation

## 2022-01-26 DIAGNOSIS — E1129 Type 2 diabetes mellitus with other diabetic kidney complication: Secondary | ICD-10-CM | POA: Insufficient documentation

## 2022-01-26 DIAGNOSIS — Z9483 Pancreas transplant status: Secondary | ICD-10-CM | POA: Diagnosis not present

## 2022-01-26 DIAGNOSIS — D899 Disorder involving the immune mechanism, unspecified: Secondary | ICD-10-CM | POA: Insufficient documentation

## 2022-01-26 DIAGNOSIS — Z789 Other specified health status: Secondary | ICD-10-CM | POA: Insufficient documentation

## 2022-01-27 LAB — CBC WITH DIFFERENTIAL/PLATELET
Abs Immature Granulocytes: 0.05 10*3/uL (ref 0.00–0.07)
Basophils Absolute: 0 10*3/uL (ref 0.0–0.1)
Basophils Relative: 0 %
Eosinophils Absolute: 0.1 10*3/uL (ref 0.0–0.5)
Eosinophils Relative: 2 %
HCT: 43.9 % (ref 36.0–46.0)
Hemoglobin: 14.1 g/dL (ref 12.0–15.0)
Immature Granulocytes: 1 %
Lymphocytes Relative: 28 %
Lymphs Abs: 2.1 10*3/uL (ref 0.7–4.0)
MCH: 29 pg (ref 26.0–34.0)
MCHC: 32.1 g/dL (ref 30.0–36.0)
MCV: 90.1 fL (ref 80.0–100.0)
Monocytes Absolute: 1.1 10*3/uL — ABNORMAL HIGH (ref 0.1–1.0)
Monocytes Relative: 14 %
Neutro Abs: 4.1 10*3/uL (ref 1.7–7.7)
Neutrophils Relative %: 55 %
Platelets: 562 10*3/uL — ABNORMAL HIGH (ref 150–400)
RBC: 4.87 MIL/uL (ref 3.87–5.11)
RDW: 16 % — ABNORMAL HIGH (ref 11.5–15.5)
WBC: 7.5 10*3/uL (ref 4.0–10.5)
nRBC: 0 % (ref 0.0–0.2)

## 2022-01-27 LAB — PHOSPHORUS: Phosphorus: 3.3 mg/dL (ref 2.5–4.6)

## 2022-01-27 LAB — BASIC METABOLIC PANEL WITH GFR
Anion gap: 10 (ref 5–15)
BUN: 28 mg/dL — ABNORMAL HIGH (ref 8–23)
CO2: 25 mmol/L (ref 22–32)
Calcium: 9.1 mg/dL (ref 8.9–10.3)
Chloride: 98 mmol/L (ref 98–111)
Creatinine, Ser: 1.1 mg/dL — ABNORMAL HIGH (ref 0.44–1.00)
GFR, Estimated: 52 mL/min — ABNORMAL LOW
Glucose, Bld: 122 mg/dL — ABNORMAL HIGH (ref 70–99)
Potassium: 4.3 mmol/L (ref 3.5–5.1)
Sodium: 133 mmol/L — ABNORMAL LOW (ref 135–145)

## 2022-01-27 LAB — MAGNESIUM: Magnesium: 2 mg/dL (ref 1.7–2.4)

## 2022-02-14 DIAGNOSIS — Z940 Kidney transplant status: Principal | ICD-10-CM

## 2022-02-17 DIAGNOSIS — Z940 Kidney transplant status: Principal | ICD-10-CM

## 2022-02-19 DIAGNOSIS — Z940 Kidney transplant status: Principal | ICD-10-CM

## 2022-02-19 MED ORDER — TACROLIMUS 0.5 MG CAPSULE, IMMEDIATE-RELEASE
ORAL_CAPSULE | ORAL | 6 refills | 0.00000 days | Status: CP
Start: 2022-02-19 — End: ?

## 2022-02-20 DIAGNOSIS — Z940 Kidney transplant status: Principal | ICD-10-CM

## 2022-02-20 MED ORDER — TACROLIMUS 0.5 MG CAPSULE, IMMEDIATE-RELEASE
ORAL_CAPSULE | 6 refills | 0 days | Status: CP
Start: 2022-02-20 — End: ?

## 2022-02-23 ENCOUNTER — Ambulatory Visit: Admit: 2022-02-23 | Discharge: 2022-02-24 | Payer: MEDICARE

## 2022-02-23 ENCOUNTER — Other Ambulatory Visit
Admission: RE | Admit: 2022-02-23 | Discharge: 2022-02-23 | Disposition: A | Discharge status: Home / Self Care | Payer: Medicare Other | Attending: Nephrology | Admitting: Nephrology

## 2022-02-23 DIAGNOSIS — B259 Cytomegaloviral disease, unspecified: Secondary | ICD-10-CM | POA: Diagnosis not present

## 2022-02-23 DIAGNOSIS — E1129 Type 2 diabetes mellitus with other diabetic kidney complication: Secondary | ICD-10-CM | POA: Insufficient documentation

## 2022-02-23 DIAGNOSIS — T8610 Unspecified complication of kidney transplant: Secondary | ICD-10-CM | POA: Insufficient documentation

## 2022-02-23 DIAGNOSIS — D899 Disorder involving the immune mechanism, unspecified: Secondary | ICD-10-CM | POA: Insufficient documentation

## 2022-02-23 DIAGNOSIS — D631 Anemia in chronic kidney disease: Secondary | ICD-10-CM | POA: Diagnosis not present

## 2022-02-23 DIAGNOSIS — Z940 Kidney transplant status: Secondary | ICD-10-CM | POA: Diagnosis not present

## 2022-02-23 DIAGNOSIS — Z9483 Pancreas transplant status: Secondary | ICD-10-CM | POA: Insufficient documentation

## 2022-02-23 DIAGNOSIS — Z09 Encounter for follow-up examination after completed treatment for conditions other than malignant neoplasm: Secondary | ICD-10-CM | POA: Insufficient documentation

## 2022-02-23 DIAGNOSIS — N390 Urinary tract infection, site not specified: Secondary | ICD-10-CM | POA: Insufficient documentation

## 2022-02-23 DIAGNOSIS — E559 Vitamin D deficiency, unspecified: Secondary | ICD-10-CM | POA: Insufficient documentation

## 2022-02-23 DIAGNOSIS — Z79899 Other long term (current) drug therapy: Secondary | ICD-10-CM | POA: Diagnosis not present

## 2022-02-23 DIAGNOSIS — Z789 Other specified health status: Secondary | ICD-10-CM | POA: Diagnosis not present

## 2022-02-23 LAB — BASIC METABOLIC PANEL
Anion gap: 10 (ref 5–15)
BUN: 34 mg/dL — ABNORMAL HIGH (ref 8–23)
CO2: 25 mmol/L (ref 22–32)
Calcium: 9.1 mg/dL (ref 8.9–10.3)
Chloride: 100 mmol/L (ref 98–111)
Creatinine, Ser: 1.07 mg/dL — ABNORMAL HIGH (ref 0.44–1.00)
GFR, Estimated: 54 mL/min — ABNORMAL LOW (ref >60)
Glucose, Bld: 117 mg/dL — ABNORMAL HIGH (ref 70–99)
Potassium: 4.1 mmol/L (ref 3.5–5.1)
Sodium: 135 mmol/L (ref 135–145)

## 2022-02-23 LAB — CBC WITH DIFFERENTIAL/PLATELET
Abs Immature Granulocytes: 0.05 10*3/uL (ref 0.00–0.07)
Basophils Absolute: 0 10*3/uL (ref 0.0–0.1)
Basophils Relative: 0 %
Eosinophils Absolute: 0.1 10*3/uL (ref 0.0–0.5)
Eosinophils Relative: 1 %
HCT: 41.1 % (ref 36.0–46.0)
Hemoglobin: 14 g/dL (ref 12.0–15.0)
Immature Granulocytes: 1 %
Lymphocytes Relative: 30 %
Lymphs Abs: 2.5 10*3/uL (ref 0.7–4.0)
MCH: 29.5 pg (ref 26.0–34.0)
MCHC: 34.1 g/dL (ref 30.0–36.0)
MCV: 86.5 fL (ref 80.0–100.0)
Monocytes Absolute: 1.1 10*3/uL — ABNORMAL HIGH (ref 0.1–1.0)
Monocytes Relative: 13 %
Neutro Abs: 4.6 10*3/uL (ref 1.7–7.7)
Neutrophils Relative %: 55 %
Platelets: 532 10*3/uL — ABNORMAL HIGH (ref 150–400)
RBC: 4.75 MIL/uL (ref 3.87–5.11)
RDW: 15 % (ref 11.5–15.5)
WBC: 8.4 10*3/uL (ref 4.0–10.5)
nRBC: 0 % (ref 0.0–0.2)

## 2022-02-23 LAB — MAGNESIUM: Magnesium: 2 mg/dL (ref 1.7–2.4)

## 2022-02-23 LAB — PHOSPHORUS: Phosphorus: 3.4 mg/dL (ref 2.5–4.6)

## 2022-02-27 ENCOUNTER — Ambulatory Visit: Admit: 2022-02-27 | Discharge: 2022-02-28 | Payer: MEDICARE

## 2022-02-27 DIAGNOSIS — E10649 Type 1 diabetes mellitus with hypoglycemia without coma: Principal | ICD-10-CM

## 2022-02-27 DIAGNOSIS — E11649 Type 2 diabetes mellitus with hypoglycemia without coma: Principal | ICD-10-CM

## 2022-02-27 MED ORDER — GLUCAGON EMERGENCY KIT 1 MG SOLUTION FOR INJECTION
1 refills | 0 days | Status: CP
Start: 2022-02-27 — End: 2022-02-27

## 2022-02-27 MED ORDER — GLUCAGON HCL 1 MG SOLUTION FOR INJECTION
2 refills | 0 days | Status: CP
Start: 2022-02-27 — End: ?

## 2022-02-27 MED ORDER — INSULIN ASPART U-100  100 UNIT/ML SUBCUTANEOUS SOLUTION
12 refills | 0 days | Status: CP
Start: 2022-02-27 — End: ?

## 2022-02-27 MED ORDER — INSULIN GLARGINE (U-100) 100 UNIT/ML SUBCUTANEOUS SOLUTION
3 refills | 0 days | Status: CP
Start: 2022-02-27 — End: 2023-02-27

## 2022-03-03 MED ORDER — INSULIN ASPART (U-100) 100 UNIT/ML (3 ML) SUBCUTANEOUS PEN
6 refills | 0 days | Status: CP
Start: 2022-03-03 — End: ?

## 2022-03-03 MED ORDER — LEVEMIR U-100 INSULIN 100 UNIT/ML SUBCUTANEOUS SOLUTION
0 refills | 0 days | Status: CP
Start: 2022-03-03 — End: ?

## 2022-03-20 DIAGNOSIS — Z940 Kidney transplant status: Principal | ICD-10-CM

## 2022-03-21 ENCOUNTER — Ambulatory Visit
Admission: RE | Admit: 2022-03-21 | Discharge: 2022-03-21 | Disposition: A | Discharge status: Home / Self Care | Payer: Medicare Other | Source: Ambulatory Visit | Attending: Internal Medicine | Admitting: Internal Medicine

## 2022-03-21 ENCOUNTER — Other Ambulatory Visit: Payer: Self-pay

## 2022-03-21 DIAGNOSIS — Z1231 Encounter for screening mammogram for malignant neoplasm of breast: Secondary | ICD-10-CM | POA: Insufficient documentation

## 2022-03-29 ENCOUNTER — Ambulatory Visit: Admit: 2022-03-29 | Discharge: 2022-03-30 | Payer: MEDICARE

## 2022-03-29 ENCOUNTER — Other Ambulatory Visit
Admission: RE | Admit: 2022-03-29 | Discharge: 2022-03-29 | Disposition: A | Discharge status: Home / Self Care | Payer: Medicare Other | Attending: Nephrology | Admitting: Nephrology

## 2022-03-29 DIAGNOSIS — B259 Cytomegaloviral disease, unspecified: Secondary | ICD-10-CM | POA: Diagnosis not present

## 2022-03-29 DIAGNOSIS — N390 Urinary tract infection, site not specified: Secondary | ICD-10-CM | POA: Diagnosis not present

## 2022-03-29 DIAGNOSIS — Z09 Encounter for follow-up examination after completed treatment for conditions other than malignant neoplasm: Secondary | ICD-10-CM | POA: Insufficient documentation

## 2022-03-29 DIAGNOSIS — D631 Anemia in chronic kidney disease: Secondary | ICD-10-CM | POA: Insufficient documentation

## 2022-03-29 DIAGNOSIS — Z9483 Pancreas transplant status: Secondary | ICD-10-CM | POA: Diagnosis not present

## 2022-03-29 DIAGNOSIS — Z789 Other specified health status: Secondary | ICD-10-CM | POA: Diagnosis not present

## 2022-03-29 DIAGNOSIS — Z79899 Other long term (current) drug therapy: Secondary | ICD-10-CM | POA: Insufficient documentation

## 2022-03-29 DIAGNOSIS — D899 Disorder involving the immune mechanism, unspecified: Secondary | ICD-10-CM | POA: Diagnosis not present

## 2022-03-29 DIAGNOSIS — E1122 Type 2 diabetes mellitus with diabetic chronic kidney disease: Secondary | ICD-10-CM | POA: Insufficient documentation

## 2022-03-29 DIAGNOSIS — Z940 Kidney transplant status: Secondary | ICD-10-CM | POA: Diagnosis not present

## 2022-03-29 LAB — BASIC METABOLIC PANEL
Anion gap: 7 (ref 5–15)
BUN: 27 mg/dL — ABNORMAL HIGH (ref 8–23)
CO2: 25 mmol/L (ref 22–32)
Calcium: 9.1 mg/dL (ref 8.9–10.3)
Chloride: 101 mmol/L (ref 98–111)
Creatinine, Ser: 0.93 mg/dL (ref 0.44–1.00)
GFR, Estimated: >60 mL/min (ref >60)
Glucose, Bld: 163 mg/dL — ABNORMAL HIGH (ref 70–99)
Potassium: 4.7 mmol/L (ref 3.5–5.1)
Sodium: 133 mmol/L — ABNORMAL LOW (ref 135–145)

## 2022-03-29 LAB — CBC WITH DIFFERENTIAL/PLATELET
Abs Immature Granulocytes: 0.1 10*3/uL — ABNORMAL HIGH (ref 0.00–0.07)
Basophils Absolute: 0 10*3/uL (ref 0.0–0.1)
Basophils Relative: 0 %
Eosinophils Absolute: 0.1 10*3/uL (ref 0.0–0.5)
Eosinophils Relative: 1 %
HCT: 40.5 % (ref 36.0–46.0)
Hemoglobin: 13.6 g/dL (ref 12.0–15.0)
Immature Granulocytes: 1 %
Lymphocytes Relative: 31 %
Lymphs Abs: 2.7 10*3/uL (ref 0.7–4.0)
MCH: 29.4 pg (ref 26.0–34.0)
MCHC: 33.6 g/dL (ref 30.0–36.0)
MCV: 87.7 fL (ref 80.0–100.0)
Monocytes Absolute: 1.1 10*3/uL — ABNORMAL HIGH (ref 0.1–1.0)
Monocytes Relative: 13 %
Neutro Abs: 4.6 10*3/uL (ref 1.7–7.7)
Neutrophils Relative %: 54 %
Platelets: 551 10*3/uL — ABNORMAL HIGH (ref 150–400)
RBC: 4.62 MIL/uL (ref 3.87–5.11)
RDW: 14.6 % (ref 11.5–15.5)
WBC: 8.5 10*3/uL (ref 4.0–10.5)
nRBC: 0 % (ref 0.0–0.2)

## 2022-03-29 LAB — MAGNESIUM: Magnesium: 2.1 mg/dL (ref 1.7–2.4)

## 2022-03-29 LAB — PHOSPHORUS: Phosphorus: 3.3 mg/dL (ref 2.5–4.6)

## 2022-04-17 DIAGNOSIS — Z940 Kidney transplant status: Principal | ICD-10-CM

## 2022-04-19 NOTE — Progress Notes (Signed)
? ? ? ? ?MRN : [id number] ? ?Cindy Fischer is a 76 y.o. (12-13-1946) female who presents with chief complaint of check circulation. ? ?History of Present Illness:  ? ?The patient returns to the office for followup and review of the noninvasive studies. There have been no interval changes in lower extremity symptoms. No interval shortening of the patient's claudication distance or development of rest pain symptoms. No new ulcers or wounds have occurred since the last visit. ?  ?Since her last visit she has had a subdural hematoma and several episodes of hypoglycemia.  These are currently being treated. ?  ?The patient denies amaurosis fugax or recent TIA symptoms. There are no recent neurological changes noted. ?The patient denies history of DVT, PE or superficial thrombophlebitis. ?The patient denies recent episodes of angina or shortness of breath.  ?  ?ABI Rt=[place] (TBI=0.0.53) and Lt=[place] (TBI=0.0.60)  (previous ABI's Rt=[place] (TBI=0.79) and Lt=[place] (TBI=0.83) ? ?Duplex ultrasound of the bilateral lower extremity arterial system demonstrates patency without focal hemodynamically significant stenosis. ? ?  ?Aorta iliac duplex shows mild to moderate stenosis of the proximal right common iliac artery stent with widely patent left common iliac artery stent.   ?  ?Right lower extremity arterial duplex shows uniform velocities no hemodynamically significant stenosis. ? ?No outpatient medications have been marked as taking for the 04/20/22 encounter (Appointment) with Delana Meyer, Dolores Lory, MD.  ? ? ?Past Medical History:  ?Diagnosis Date  ? Cancer ([place]) 2016  ? skin (nose)  ? Diabetes mellitus without complication ([place])   ? End stage renal disease ([place])   ? s/p renal transplant  ? Hypothyroidism   ? Pneumonia   ? Renal disorder   ? ? ?Past Surgical History:  ?Procedure Laterality Date  ? CATARACT EXTRACTION, BILATERAL    ? COLONOSCOPY  12/02/2010  ? COLONOSCOPY WITH PROPOFOL N/A 09/04/2016  ? Procedure: COLONOSCOPY WITH PROPOFOL;   Surgeon: Christene Lye, MD;  Location: [facility] END[facility]COPY;  Service: Endoscopy;  Laterality: N/A;  ? EYE SURGERY    ? INSERTION OF DIALYSIS CATHETER  2014  ? removed 2014 (only had in for about 8 weeks)  ? laser surgery on eyes  1999  ? LOWER EXTREMITY ANGIOGRAPHY Right 10/16/2018  ? Procedure: LOWER EXTREMITY ANGIOGRAPHY;  Surgeon: Katha Cabal, MD;  Location: [place] [facility];  Service: Cardiovascular;  Laterality: Right;  ? LOWER EXTREMITY ANGIOGRAPHY Right 02/08/2021  ? Procedure: LOWER EXTREMITY ANGIOGRAPHY;  Surgeon: Katha Cabal, MD;  Location: [place] [facility];  Service: Cardiovascular;  Laterality: Right;  ? LOWER EXTREMITY ANGIOGRAPHY Right 03/02/2021  ? Procedure: LOWER EXTREMITY ANGIOGRAPHY;  Surgeon: Katha Cabal, MD;  Location: [place] [facility];  Service: Cardiovascular;  Laterality: Right;  ? NEPHRECTOMY TRANSPLANTED ORGAN  10/20/2014  ? SPINE SURGERY  11/10/2021  ? TONSILLECTOMY    ? TUBAL LIGATION    ? ? ?Social History ?Social History  ? ?Tobacco Use  ? Smoking status: Former  ?  Packs/day: 1.00  ?  Years: 35.00  ?  Pack years: 35.00  ?  Types: Cigarettes  ?  Quit date: 2015  ?  Years since quitting: 8.3  ? Smokeless tobacco: Never  ?Vaping Use  ? Vaping Use: Never used  ?Substance Use Topics  ? Alcohol use: No  ? Drug use: No  ? ? ?Family History ?Family History  ?Problem Relation Age of Onset  ? Multiple myeloma Mother   ? Diabetes Father   ? Heart attack Father  89  ? Breast cancer Neg Hx   ? ? ?Allergies  ?Allergen Reactions  ? Latex Itching  ? Voltaren [Diclofenac Sodium] Diarrhea and Nausea And Vomiting  ? Oxycodone-Acetaminophen Nausea And Vomiting  ? Penicillins Rash  ?  Has patient had a PCN reaction causing immediate rash, facial/tongue/throat swelling, SOB or lightheadedness with hypotension: Yes ?Has patient had a PCN reaction causing severe rash involving mucus membranes or skin necrosis: No ?Has patient had a PCN reaction that required hospitalization:  No ?Has patient had a PCN reaction occurring within the last 10 years: No ?If all of the above answers are "NO", then may proceed with Cephalosporin use. ?  ? ? ? ?REVIEW OF SYSTEMS (Negative unless checked) ? ?Constitutional: []Weight loss  []Fever  []Chills ?Cardiac: []Chest pain   []Chest pressure   []Palpitations   []Shortness of breath when laying flat   []Shortness of breath with exertion. ?Vascular:  [x]Pain in legs with walking   []Pain in legs at rest  []History of DVT   []Phlebitis   []Swelling in legs   []Varicose veins   []Non-healing ulcers ?Pulmonary:   []Uses home oxygen   []Productive cough   []Hemoptysis   []Wheeze  []COPD   []Asthma ?Neurologic:  []Dizziness   []Seizures   []History of stroke   []History of TIA  []Aphasia   []Vissual changes   []Weakness or numbness in arm   []Weakness or numbness in leg ?Musculoskeletal:   []Joint swelling   []Joint pain   []Low back pain ?Hematologic:  []Easy bruising  []Easy bleeding   []Hypercoagulable state   []Anemic ?Gastrointestinal:  []Diarrhea   []Vomiting  []Gastroesophageal reflux/heartburn   []Difficulty swallowing. ?Genitourinary:  []Chronic kidney disease   []Difficult urination  []Frequent urination   []Blood in urine ?Skin:  []Rashes   []Ulcers  ?Psychological:  []History of anxiety   [] History of major depression. ? ?Physical Examination ? ?There were no vitals filed for this visit. ?There is no height or weight on file to calculate BMI. ?Gen: WD/WN, NAD ?Head: [place]/AT, No temporalis wasting.  ?Ear/Nose/Throat: Hearing grossly intact, nares w/o erythema or drainage ?Eyes: PER, EOMI, sclera nonicteric.  ?Neck: Supple, no masses.  No bruit or JVD.  ?Pulmonary:  Good air movement, no audible wheezing, no use of accessory muscles.  ?Cardiac: RRR, normal S1, S2, no Murmurs. ?Vascular:  mild trophic changes, no open wounds ?Vessel Right Left  ?Radial Palpable Palpable  ?PT Not Palpable Not Palpable  ?DP Not Palpable Not Palpable  ?Gastrointestinal: soft,  non-distended. No guarding/no peritoneal signs.  ?Musculoskeletal: M/S 5/5 throughout.  No visible deformity.  ?Neurologic: CN 2-12 intact. Pain and light touch intact in extremities.  Symmetrical.  Speech is fluent. Motor exam as listed above. ?Psychiatric: Judgment intact, Mood & affect appropriate for pt's clinical situation. ?Dermatologic: No rashes or ulcers noted.  No changes consistent with cellulitis. ? ? ?CBC ?Lab Results  ?Component Value Date  ? WBC 8.5 03/29/2022  ? HGB 13.6 03/29/2022  ? HCT 40.5 03/29/2022  ? MCV 87.7 03/29/2022  ? PLT 551 (H) 03/29/2022  ? ? ?BMET ?   ?Component Value Date/Time  ? NA 133 (L) 03/29/2022 0825  ? NA 138 03/29/2015 0918  ? K 4.7 03/29/2022 0825  ? K 4.5 03/29/2015 0918  ? CL 101 03/29/2022 0825  ? CL 104 03/29/2015 0918  ? CO2 25 03/29/2022 0825  ? CO2 26 03/29/2015 0918  ? GLUCOSE 163 (H) 03/29/2022 0825  ? GLUCOSE 187 (H)  03/29/2015 1007  ? BUN 27 (H) 03/29/2022 0825  ? BUN 10 03/29/2015 0918  ? CREATININE 0.93 03/29/2022 0825  ? CREATININE 0.80 03/29/2015 0918  ? CALCIUM 9.1 03/29/2022 0825  ? CALCIUM 9.1 03/29/2015 0918  ? GFRNONAA >60 03/29/2022 0825  ? GFRNONAA >60 03/29/2015 0918  ? GFRAA >60 09/24/2020 0836  ? GFRAA >60 03/29/2015 0918  ? ?CrCl cannot be calculated (Patient's most recent lab result is older than the maximum 21 days allowed.). ? ?COAG ?No results found for: INR, PROTIME ? ?Radiology ?MM 3D SCREEN BREAST BILATERAL ? ?Result Date: 03/21/2022 ?CLINICAL DATA:  Screening. EXAM: DIGITAL SCREENING BILATERAL MAMMOGRAM WITH TOMOSYNTHESIS AND CAD TECHNIQUE: Bilateral screening digital craniocaudal and mediolateral oblique mammograms were obtained. Bilateral screening digital breast tomosynthesis was performed. The images were evaluated with computer-aided detection. COMPARISON:  Previous exam(s). ACR Breast Density Category b: There are scattered areas of fibroglandular density. FINDINGS: There are no findings suspicious for malignancy. IMPRESSION: No  mammographic evidence of malignancy. A result letter of this screening mammogram will be mailed directly to the patient. RECOMMENDATION: Screening mammogram in one year. (Code:SM-B-01Y) BI-RADS CATEGORY  1: Neg

## 2022-04-20 ENCOUNTER — Encounter (INDEPENDENT_AMBULATORY_CARE_PROVIDER_SITE_OTHER): Payer: Self-pay | Admitting: Vascular Surgery

## 2022-04-20 ENCOUNTER — Ambulatory Visit (INDEPENDENT_AMBULATORY_CARE_PROVIDER_SITE_OTHER): Payer: Medicare Other | Admitting: Vascular Surgery

## 2022-04-20 ENCOUNTER — Ambulatory Visit (INDEPENDENT_AMBULATORY_CARE_PROVIDER_SITE_OTHER): Payer: Medicare Other

## 2022-04-20 VITALS — BP 136/71 | HR 84 | Resp 17 | Ht 62.0 in | Wt 152.4 lb

## 2022-04-20 DIAGNOSIS — I70213 Atherosclerosis of native arteries of extremities with intermittent claudication, bilateral legs: Secondary | ICD-10-CM | POA: Diagnosis not present

## 2022-04-20 DIAGNOSIS — Z794 Long term (current) use of insulin: Secondary | ICD-10-CM

## 2022-04-20 DIAGNOSIS — I10 Essential (primary) hypertension: Secondary | ICD-10-CM | POA: Diagnosis not present

## 2022-04-20 DIAGNOSIS — E782 Mixed hyperlipidemia: Secondary | ICD-10-CM

## 2022-04-20 DIAGNOSIS — K219 Gastro-esophageal reflux disease without esophagitis: Secondary | ICD-10-CM

## 2022-04-20 DIAGNOSIS — E1159 Type 2 diabetes mellitus with other circulatory complications: Secondary | ICD-10-CM

## 2022-04-25 ENCOUNTER — Other Ambulatory Visit
Admission: RE | Admit: 2022-04-25 | Discharge: 2022-04-25 | Disposition: A | Discharge status: Home / Self Care | Payer: Medicare Other | Attending: Nephrology | Admitting: Nephrology

## 2022-04-25 DIAGNOSIS — Z940 Kidney transplant status: Secondary | ICD-10-CM | POA: Insufficient documentation

## 2022-04-25 DIAGNOSIS — Z79899 Other long term (current) drug therapy: Secondary | ICD-10-CM | POA: Diagnosis not present

## 2022-04-25 DIAGNOSIS — E1122 Type 2 diabetes mellitus with diabetic chronic kidney disease: Secondary | ICD-10-CM | POA: Insufficient documentation

## 2022-04-25 DIAGNOSIS — Z09 Encounter for follow-up examination after completed treatment for conditions other than malignant neoplasm: Secondary | ICD-10-CM | POA: Diagnosis not present

## 2022-04-25 DIAGNOSIS — D899 Disorder involving the immune mechanism, unspecified: Secondary | ICD-10-CM | POA: Diagnosis present

## 2022-04-25 DIAGNOSIS — D631 Anemia in chronic kidney disease: Secondary | ICD-10-CM | POA: Insufficient documentation

## 2022-04-25 DIAGNOSIS — B259 Cytomegaloviral disease, unspecified: Secondary | ICD-10-CM | POA: Insufficient documentation

## 2022-04-25 DIAGNOSIS — Z789 Other specified health status: Secondary | ICD-10-CM | POA: Insufficient documentation

## 2022-04-25 DIAGNOSIS — N390 Urinary tract infection, site not specified: Secondary | ICD-10-CM | POA: Insufficient documentation

## 2022-04-25 DIAGNOSIS — Z9483 Pancreas transplant status: Secondary | ICD-10-CM | POA: Insufficient documentation

## 2022-04-25 LAB — BASIC METABOLIC PANEL
Anion gap: 7 (ref 5–15)
BUN: 34 mg/dL — ABNORMAL HIGH (ref 8–23)
CO2: 27 mmol/L (ref 22–32)
Calcium: 9 mg/dL (ref 8.9–10.3)
Chloride: 99 mmol/L (ref 98–111)
Creatinine, Ser: 1 mg/dL (ref 0.44–1.00)
GFR, Estimated: 58 mL/min — ABNORMAL LOW (ref >60)
Glucose, Bld: 114 mg/dL — ABNORMAL HIGH (ref 70–99)
Potassium: 4.1 mmol/L (ref 3.5–5.1)
Sodium: 133 mmol/L — ABNORMAL LOW (ref 135–145)

## 2022-04-25 LAB — CBC WITH DIFFERENTIAL/PLATELET
Abs Immature Granulocytes: 0.1 10*3/uL — ABNORMAL HIGH (ref 0.00–0.07)
Basophils Absolute: 0.1 10*3/uL (ref 0.0–0.1)
Basophils Relative: 1 %
Eosinophils Absolute: 0.1 10*3/uL (ref 0.0–0.5)
Eosinophils Relative: 1 %
HCT: 40.9 % (ref 36.0–46.0)
Hemoglobin: 13.5 g/dL (ref 12.0–15.0)
Immature Granulocytes: 1 %
Lymphocytes Relative: 31 %
Lymphs Abs: 2.3 10*3/uL (ref 0.7–4.0)
MCH: 29.2 pg (ref 26.0–34.0)
MCHC: 33 g/dL (ref 30.0–36.0)
MCV: 88.5 fL (ref 80.0–100.0)
Monocytes Absolute: 1 10*3/uL (ref 0.1–1.0)
Monocytes Relative: 13 %
Neutro Abs: 3.9 10*3/uL (ref 1.7–7.7)
Neutrophils Relative %: 53 %
Platelets: 549 10*3/uL — ABNORMAL HIGH (ref 150–400)
RBC: 4.62 MIL/uL (ref 3.87–5.11)
RDW: 14.7 % (ref 11.5–15.5)
WBC: 7.4 10*3/uL (ref 4.0–10.5)
nRBC: 0 % (ref 0.0–0.2)

## 2022-04-25 LAB — MAGNESIUM: Magnesium: 2.1 mg/dL (ref 1.7–2.4)

## 2022-04-25 LAB — ALKALINE PHOSPHATASE: Alkaline Phosphatase: 51 U/L (ref 38–126)

## 2022-05-04 ENCOUNTER — Ambulatory Visit: Admit: 2022-05-04 | Discharge: 2022-05-05 | Payer: MEDICARE

## 2022-05-04 DIAGNOSIS — E118 Type 2 diabetes mellitus with unspecified complications: Principal | ICD-10-CM

## 2022-05-04 DIAGNOSIS — D849 Immunodeficiency, unspecified: Principal | ICD-10-CM

## 2022-05-04 DIAGNOSIS — Z48298 Encounter for aftercare following other organ transplant: Principal | ICD-10-CM

## 2022-05-04 DIAGNOSIS — D751 Secondary polycythemia: Principal | ICD-10-CM

## 2022-05-04 DIAGNOSIS — I10 Essential (primary) hypertension: Principal | ICD-10-CM

## 2022-05-04 DIAGNOSIS — Z940 Kidney transplant status: Principal | ICD-10-CM

## 2022-05-15 DIAGNOSIS — Z940 Kidney transplant status: Principal | ICD-10-CM

## 2022-05-25 ENCOUNTER — Other Ambulatory Visit
Admission: RE | Admit: 2022-05-25 | Discharge: 2022-05-25 | Disposition: A | Discharge status: Home / Self Care | Payer: Medicare Other | Attending: Nephrology | Admitting: Nephrology

## 2022-05-25 DIAGNOSIS — Y830 Surgical operation with transplant of whole organ as the cause of abnormal reaction of the patient, or of later complication, without mention of misadventure at the time of the procedure: Secondary | ICD-10-CM | POA: Insufficient documentation

## 2022-05-25 DIAGNOSIS — Z114 Encounter for screening for human immunodeficiency virus [HIV]: Secondary | ICD-10-CM | POA: Diagnosis not present

## 2022-05-25 DIAGNOSIS — Z9483 Pancreas transplant status: Secondary | ICD-10-CM | POA: Insufficient documentation

## 2022-05-25 DIAGNOSIS — E1129 Type 2 diabetes mellitus with other diabetic kidney complication: Secondary | ICD-10-CM | POA: Insufficient documentation

## 2022-05-25 DIAGNOSIS — E559 Vitamin D deficiency, unspecified: Secondary | ICD-10-CM | POA: Diagnosis not present

## 2022-05-25 DIAGNOSIS — Z940 Kidney transplant status: Secondary | ICD-10-CM | POA: Insufficient documentation

## 2022-05-25 DIAGNOSIS — Z789 Other specified health status: Secondary | ICD-10-CM | POA: Insufficient documentation

## 2022-05-25 DIAGNOSIS — B259 Cytomegaloviral disease, unspecified: Secondary | ICD-10-CM | POA: Diagnosis not present

## 2022-05-25 DIAGNOSIS — Z79899 Other long term (current) drug therapy: Secondary | ICD-10-CM | POA: Insufficient documentation

## 2022-05-25 DIAGNOSIS — D899 Disorder involving the immune mechanism, unspecified: Secondary | ICD-10-CM | POA: Diagnosis present

## 2022-05-25 DIAGNOSIS — D631 Anemia in chronic kidney disease: Secondary | ICD-10-CM | POA: Diagnosis not present

## 2022-05-25 DIAGNOSIS — N390 Urinary tract infection, site not specified: Secondary | ICD-10-CM | POA: Diagnosis not present

## 2022-05-25 DIAGNOSIS — Z09 Encounter for follow-up examination after completed treatment for conditions other than malignant neoplasm: Secondary | ICD-10-CM | POA: Insufficient documentation

## 2022-05-25 DIAGNOSIS — T8610 Unspecified complication of kidney transplant: Secondary | ICD-10-CM | POA: Diagnosis not present

## 2022-05-25 LAB — CBC WITH DIFFERENTIAL/PLATELET
Abs Immature Granulocytes: 0.08 10*3/uL — ABNORMAL HIGH (ref 0.00–0.07)
Basophils Absolute: 0 10*3/uL (ref 0.0–0.1)
Basophils Relative: 1 %
Eosinophils Absolute: 0.1 10*3/uL (ref 0.0–0.5)
Eosinophils Relative: 1 %
HCT: 41.2 % (ref 36.0–46.0)
Hemoglobin: 13.7 g/dL (ref 12.0–15.0)
Immature Granulocytes: 1 %
Lymphocytes Relative: 27 %
Lymphs Abs: 2.1 10*3/uL (ref 0.7–4.0)
MCH: 29.1 pg (ref 26.0–34.0)
MCHC: 33.3 g/dL (ref 30.0–36.0)
MCV: 87.5 fL (ref 80.0–100.0)
Monocytes Absolute: 1 10*3/uL (ref 0.1–1.0)
Monocytes Relative: 12 %
Neutro Abs: 4.7 10*3/uL (ref 1.7–7.7)
Neutrophils Relative %: 58 %
Platelets: 581 10*3/uL — ABNORMAL HIGH (ref 150–400)
RBC: 4.71 MIL/uL (ref 3.87–5.11)
RDW: 14.9 % (ref 11.5–15.5)
WBC: 8 10*3/uL (ref 4.0–10.5)
nRBC: 0 % (ref 0.0–0.2)

## 2022-05-25 LAB — BASIC METABOLIC PANEL
Anion gap: 7 (ref 5–15)
BUN: 29 mg/dL — ABNORMAL HIGH (ref 8–23)
CO2: 27 mmol/L (ref 22–32)
Calcium: 9.2 mg/dL (ref 8.9–10.3)
Chloride: 100 mmol/L (ref 98–111)
Creatinine, Ser: 1.04 mg/dL — ABNORMAL HIGH (ref 0.44–1.00)
GFR, Estimated: 56 mL/min — ABNORMAL LOW (ref >60)
Glucose, Bld: 119 mg/dL — ABNORMAL HIGH (ref 70–99)
Potassium: 4.4 mmol/L (ref 3.5–5.1)
Sodium: 134 mmol/L — ABNORMAL LOW (ref 135–145)

## 2022-05-25 LAB — MAGNESIUM: Magnesium: 1.9 mg/dL (ref 1.7–2.4)

## 2022-05-25 LAB — PHOSPHORUS: Phosphorus: 3.6 mg/dL (ref 2.5–4.6)

## 2022-06-06 MED ORDER — PREDNISONE 5 MG TABLET
ORAL_TABLET | Freq: Every day | ORAL | 11 refills | 30 days | Status: CP
Start: 2022-06-06 — End: ?

## 2022-06-12 DIAGNOSIS — Z940 Kidney transplant status: Principal | ICD-10-CM

## 2022-06-15 ENCOUNTER — Other Ambulatory Visit (INDEPENDENT_AMBULATORY_CARE_PROVIDER_SITE_OTHER): Payer: Self-pay | Admitting: Vascular Surgery

## 2022-06-21 MED ORDER — ATORVASTATIN 20 MG TABLET
ORAL_TABLET | Freq: Every day | ORAL | 11 refills | 30 days | Status: CP
Start: 2022-06-21 — End: ?

## 2022-06-26 ENCOUNTER — Ambulatory Visit: Admit: 2022-06-26 | Discharge: 2022-06-27 | Payer: MEDICARE

## 2022-06-26 ENCOUNTER — Other Ambulatory Visit
Admission: RE | Admit: 2022-06-26 | Discharge: 2022-06-26 | Disposition: A | Discharge status: Home / Self Care | Payer: Medicare Other | Attending: Nephrology | Admitting: Nephrology

## 2022-06-26 DIAGNOSIS — E1129 Type 2 diabetes mellitus with other diabetic kidney complication: Secondary | ICD-10-CM | POA: Insufficient documentation

## 2022-06-26 DIAGNOSIS — Z940 Kidney transplant status: Secondary | ICD-10-CM | POA: Insufficient documentation

## 2022-06-26 DIAGNOSIS — D899 Disorder involving the immune mechanism, unspecified: Secondary | ICD-10-CM | POA: Diagnosis not present

## 2022-06-26 DIAGNOSIS — B259 Cytomegaloviral disease, unspecified: Secondary | ICD-10-CM | POA: Diagnosis not present

## 2022-06-26 DIAGNOSIS — Z9483 Pancreas transplant status: Secondary | ICD-10-CM | POA: Insufficient documentation

## 2022-06-26 DIAGNOSIS — Z79899 Other long term (current) drug therapy: Secondary | ICD-10-CM | POA: Insufficient documentation

## 2022-06-26 DIAGNOSIS — D631 Anemia in chronic kidney disease: Secondary | ICD-10-CM | POA: Insufficient documentation

## 2022-06-26 DIAGNOSIS — N390 Urinary tract infection, site not specified: Secondary | ICD-10-CM | POA: Insufficient documentation

## 2022-06-26 DIAGNOSIS — Z114 Encounter for screening for human immunodeficiency virus [HIV]: Secondary | ICD-10-CM | POA: Insufficient documentation

## 2022-06-26 LAB — CBC WITH DIFFERENTIAL/PLATELET
Abs Immature Granulocytes: 0.14 10*3/uL — ABNORMAL HIGH (ref 0.00–0.07)
Basophils Absolute: 0 10*3/uL (ref 0.0–0.1)
Basophils Relative: 0 %
Eosinophils Absolute: 0.2 10*3/uL (ref 0.0–0.5)
Eosinophils Relative: 2 %
HCT: 38.6 % (ref 36.0–46.0)
Hemoglobin: 13.2 g/dL (ref 12.0–15.0)
Immature Granulocytes: 2 %
Lymphocytes Relative: 30 %
Lymphs Abs: 2.3 10*3/uL (ref 0.7–4.0)
MCH: 29.4 pg (ref 26.0–34.0)
MCHC: 34.2 g/dL (ref 30.0–36.0)
MCV: 86 fL (ref 80.0–100.0)
Monocytes Absolute: 1 10*3/uL (ref 0.1–1.0)
Monocytes Relative: 13 %
Neutro Abs: 4.2 10*3/uL (ref 1.7–7.7)
Neutrophils Relative %: 53 %
Platelets: 596 10*3/uL — ABNORMAL HIGH (ref 150–400)
RBC: 4.49 MIL/uL (ref 3.87–5.11)
RDW: 14.6 % (ref 11.5–15.5)
WBC: 7.8 10*3/uL (ref 4.0–10.5)
nRBC: 0 % (ref 0.0–0.2)

## 2022-06-26 LAB — BASIC METABOLIC PANEL
Anion gap: 7 (ref 5–15)
BUN: 41 mg/dL — ABNORMAL HIGH (ref 8–23)
CO2: 24 mmol/L (ref 22–32)
Calcium: 9.1 mg/dL (ref 8.9–10.3)
Chloride: 103 mmol/L (ref 98–111)
Creatinine, Ser: 1.17 mg/dL — ABNORMAL HIGH (ref 0.44–1.00)
GFR, Estimated: 48 mL/min — ABNORMAL LOW (ref >60)
Glucose, Bld: 129 mg/dL — ABNORMAL HIGH (ref 70–99)
Potassium: 4.2 mmol/L (ref 3.5–5.1)
Sodium: 134 mmol/L — ABNORMAL LOW (ref 135–145)

## 2022-06-26 LAB — MAGNESIUM: Magnesium: 2.2 mg/dL (ref 1.7–2.4)

## 2022-06-26 LAB — PHOSPHORUS: Phosphorus: 3.7 mg/dL (ref 2.5–4.6)

## 2022-06-28 ENCOUNTER — Ambulatory Visit: Admit: 2022-06-28 | Discharge: 2022-06-29 | Payer: MEDICARE

## 2022-07-10 DIAGNOSIS — Z940 Kidney transplant status: Principal | ICD-10-CM

## 2022-07-17 ENCOUNTER — Encounter: Payer: Self-pay | Admitting: Podiatry

## 2022-07-17 ENCOUNTER — Ambulatory Visit (INDEPENDENT_AMBULATORY_CARE_PROVIDER_SITE_OTHER): Payer: Medicare Other | Admitting: Podiatry

## 2022-07-17 DIAGNOSIS — L603 Nail dystrophy: Secondary | ICD-10-CM | POA: Diagnosis not present

## 2022-07-17 NOTE — Progress Notes (Unsigned)
  Subjective:  Patient ID: Cindy Fischer, female    DOB: 1946/05/25,  MRN: [id number]  Chief Complaint  Patient presents with   Nail Problem    "My big toe started bothering me last Thursday." N - toe painful L - toenail hallux right D - Thursday of last week O - suddenly C - sore, Diabetic A - walking, in bed T - I tried foam thing that slips over the toe     76 y.o. female presents with the above complaint. History confirmed with patient.   Objective:  Physical Exam: warm, good capillary refill, no trophic changes or ulcerative lesions, weakly palpable DP and PT pulses, and normal sensory exam.  Right Foot: Dystrophic painful hallux nail, scar tissue from previous ulceration proximal to this  Assessment:   1. Nail dystrophy      Plan:  Patient was evaluated and treated and all questions answered.  Most of the pain appeared to be centered around the nail plate itself today.  She does have significant scar tissue in this area from previous ulceration which is fully healed.  There is no evidence of new infection or ulceration currently.  I debrided the nail in length and thickness with a sharp nail nipper and rotary bur.  She will return for follow-up as needed, if persistently worsening could consider permanent removal of nail plate which I discussed with her but would have to be cautious with this due to her previous history of poor wound healing  Return if symptoms worsen or fail to improve.

## 2022-07-31 ENCOUNTER — Ambulatory Visit: Admit: 2022-07-31 | Discharge: 2022-08-01 | Payer: MEDICARE

## 2022-07-31 ENCOUNTER — Other Ambulatory Visit
Admission: RE | Admit: 2022-07-31 | Discharge: 2022-07-31 | Disposition: A | Discharge status: Home / Self Care | Payer: Medicare Other | Attending: Nephrology | Admitting: Nephrology

## 2022-07-31 DIAGNOSIS — Z940 Kidney transplant status: Secondary | ICD-10-CM | POA: Insufficient documentation

## 2022-07-31 DIAGNOSIS — Z9483 Pancreas transplant status: Secondary | ICD-10-CM | POA: Diagnosis not present

## 2022-07-31 DIAGNOSIS — D899 Disorder involving the immune mechanism, unspecified: Secondary | ICD-10-CM | POA: Insufficient documentation

## 2022-07-31 DIAGNOSIS — D631 Anemia in chronic kidney disease: Secondary | ICD-10-CM | POA: Diagnosis not present

## 2022-07-31 DIAGNOSIS — N390 Urinary tract infection, site not specified: Secondary | ICD-10-CM | POA: Insufficient documentation

## 2022-07-31 DIAGNOSIS — Z789 Other specified health status: Secondary | ICD-10-CM | POA: Insufficient documentation

## 2022-07-31 DIAGNOSIS — E1129 Type 2 diabetes mellitus with other diabetic kidney complication: Secondary | ICD-10-CM | POA: Insufficient documentation

## 2022-07-31 DIAGNOSIS — Z114 Encounter for screening for human immunodeficiency virus [HIV]: Secondary | ICD-10-CM | POA: Diagnosis not present

## 2022-07-31 DIAGNOSIS — Z09 Encounter for follow-up examination after completed treatment for conditions other than malignant neoplasm: Secondary | ICD-10-CM | POA: Insufficient documentation

## 2022-07-31 DIAGNOSIS — N189 Chronic kidney disease, unspecified: Secondary | ICD-10-CM | POA: Insufficient documentation

## 2022-07-31 DIAGNOSIS — Z79899 Other long term (current) drug therapy: Secondary | ICD-10-CM | POA: Diagnosis not present

## 2022-07-31 DIAGNOSIS — B259 Cytomegaloviral disease, unspecified: Secondary | ICD-10-CM | POA: Diagnosis not present

## 2022-07-31 DIAGNOSIS — E559 Vitamin D deficiency, unspecified: Secondary | ICD-10-CM | POA: Insufficient documentation

## 2022-07-31 LAB — CBC WITH DIFFERENTIAL/PLATELET
Abs Immature Granulocytes: 0.1 10*3/uL — ABNORMAL HIGH (ref 0.00–0.07)
Basophils Absolute: 0 10*3/uL (ref 0.0–0.1)
Basophils Relative: 0 %
Eosinophils Absolute: 0.2 10*3/uL (ref 0.0–0.5)
Eosinophils Relative: 2 %
HCT: 39.8 % (ref 36.0–46.0)
Hemoglobin: 13.4 g/dL (ref 12.0–15.0)
Immature Granulocytes: 1 %
Lymphocytes Relative: 35 %
Lymphs Abs: 3 10*3/uL (ref 0.7–4.0)
MCH: 29.2 pg (ref 26.0–34.0)
MCHC: 33.7 g/dL (ref 30.0–36.0)
MCV: 86.7 fL (ref 80.0–100.0)
Monocytes Absolute: 1.1 10*3/uL — ABNORMAL HIGH (ref 0.1–1.0)
Monocytes Relative: 13 %
Neutro Abs: 4.1 10*3/uL (ref 1.7–7.7)
Neutrophils Relative %: 49 %
Platelets: 607 10*3/uL — ABNORMAL HIGH (ref 150–400)
RBC: 4.59 MIL/uL (ref 3.87–5.11)
RDW: 15 % (ref 11.5–15.5)
WBC: 8.4 10*3/uL (ref 4.0–10.5)
nRBC: 0 % (ref 0.0–0.2)

## 2022-07-31 LAB — MAGNESIUM: Magnesium: 2.2 mg/dL (ref 1.7–2.4)

## 2022-07-31 LAB — BASIC METABOLIC PANEL
Anion gap: 6 (ref 5–15)
BUN: 37 mg/dL — ABNORMAL HIGH (ref 8–23)
CO2: 26 mmol/L (ref 22–32)
Calcium: 8.8 mg/dL — ABNORMAL LOW (ref 8.9–10.3)
Chloride: 98 mmol/L (ref 98–111)
Creatinine, Ser: 1.19 mg/dL — ABNORMAL HIGH (ref 0.44–1.00)
GFR, Estimated: 47 mL/min — ABNORMAL LOW (ref >60)
Glucose, Bld: 96 mg/dL (ref 70–99)
Potassium: 4.1 mmol/L (ref 3.5–5.1)
Sodium: 130 mmol/L — ABNORMAL LOW (ref 135–145)

## 2022-07-31 LAB — PHOSPHORUS: Phosphorus: 3.8 mg/dL (ref 2.5–4.6)

## 2022-08-07 DIAGNOSIS — Z940 Kidney transplant status: Principal | ICD-10-CM

## 2022-08-10 ENCOUNTER — Ambulatory Visit: Admit: 2022-08-10 | Discharge: 2022-08-11 | Payer: MEDICARE

## 2022-08-10 DIAGNOSIS — L719 Rosacea, unspecified: Principal | ICD-10-CM

## 2022-08-10 MED ORDER — METRONIDAZOLE 0.75 % TOPICAL GEL
Freq: Two times a day (BID) | TOPICAL | 5 refills | 0 days | Status: CP
Start: 2022-08-10 — End: ?

## 2022-08-29 ENCOUNTER — Other Ambulatory Visit: Admit: 2022-08-29 | Discharge: 2022-08-30 | Payer: MEDICARE

## 2022-08-29 ENCOUNTER — Other Ambulatory Visit
Admission: RE | Admit: 2022-08-29 | Discharge: 2022-08-29 | Disposition: A | Discharge status: Home / Self Care | Payer: Medicare Other | Attending: Nephrology | Admitting: Nephrology

## 2022-08-29 DIAGNOSIS — N390 Urinary tract infection, site not specified: Secondary | ICD-10-CM | POA: Insufficient documentation

## 2022-08-29 DIAGNOSIS — Z940 Kidney transplant status: Secondary | ICD-10-CM | POA: Diagnosis present

## 2022-08-29 DIAGNOSIS — Z9483 Pancreas transplant status: Secondary | ICD-10-CM | POA: Insufficient documentation

## 2022-08-29 DIAGNOSIS — B259 Cytomegaloviral disease, unspecified: Secondary | ICD-10-CM | POA: Insufficient documentation

## 2022-08-29 DIAGNOSIS — D899 Disorder involving the immune mechanism, unspecified: Secondary | ICD-10-CM | POA: Diagnosis not present

## 2022-08-29 DIAGNOSIS — E559 Vitamin D deficiency, unspecified: Secondary | ICD-10-CM | POA: Insufficient documentation

## 2022-08-29 DIAGNOSIS — N189 Chronic kidney disease, unspecified: Secondary | ICD-10-CM | POA: Insufficient documentation

## 2022-08-29 DIAGNOSIS — Z79899 Other long term (current) drug therapy: Secondary | ICD-10-CM | POA: Diagnosis not present

## 2022-08-29 DIAGNOSIS — E1122 Type 2 diabetes mellitus with diabetic chronic kidney disease: Secondary | ICD-10-CM | POA: Diagnosis not present

## 2022-08-29 DIAGNOSIS — D631 Anemia in chronic kidney disease: Secondary | ICD-10-CM | POA: Insufficient documentation

## 2022-08-29 DIAGNOSIS — Z09 Encounter for follow-up examination after completed treatment for conditions other than malignant neoplasm: Secondary | ICD-10-CM | POA: Insufficient documentation

## 2022-08-29 DIAGNOSIS — Z789 Other specified health status: Secondary | ICD-10-CM | POA: Insufficient documentation

## 2022-08-29 LAB — CBC WITH DIFFERENTIAL/PLATELET
Abs Immature Granulocytes: 0.05 10*3/uL (ref 0.00–0.07)
Basophils Absolute: 0 10*3/uL (ref 0.0–0.1)
Basophils Relative: 0 %
Eosinophils Absolute: 0.2 10*3/uL (ref 0.0–0.5)
Eosinophils Relative: 2 %
HCT: 40.6 % (ref 36.0–46.0)
Hemoglobin: 13.4 g/dL (ref 12.0–15.0)
Immature Granulocytes: 1 %
Lymphocytes Relative: 33 %
Lymphs Abs: 2.4 10*3/uL (ref 0.7–4.0)
MCH: 28.7 pg (ref 26.0–34.0)
MCHC: 33 g/dL (ref 30.0–36.0)
MCV: 86.9 fL (ref 80.0–100.0)
Monocytes Absolute: 1 10*3/uL (ref 0.1–1.0)
Monocytes Relative: 14 %
Neutro Abs: 3.8 10*3/uL (ref 1.7–7.7)
Neutrophils Relative %: 50 %
Platelets: 544 10*3/uL — ABNORMAL HIGH (ref 150–400)
RBC: 4.67 MIL/uL (ref 3.87–5.11)
RDW: 15.4 % (ref 11.5–15.5)
WBC: 7.4 10*3/uL (ref 4.0–10.5)
nRBC: 0 % (ref 0.0–0.2)

## 2022-08-29 LAB — BASIC METABOLIC PANEL
Anion gap: 7 (ref 5–15)
BUN: 28 mg/dL — ABNORMAL HIGH (ref 8–23)
CO2: 25 mmol/L (ref 22–32)
Calcium: 8.8 mg/dL — ABNORMAL LOW (ref 8.9–10.3)
Chloride: 100 mmol/L (ref 98–111)
Creatinine, Ser: 1.17 mg/dL — ABNORMAL HIGH (ref 0.44–1.00)
GFR, Estimated: 48 mL/min — ABNORMAL LOW (ref >60)
Glucose, Bld: 106 mg/dL — ABNORMAL HIGH (ref 70–99)
Potassium: 4.2 mmol/L (ref 3.5–5.1)
Sodium: 132 mmol/L — ABNORMAL LOW (ref 135–145)

## 2022-09-04 DIAGNOSIS — Z940 Kidney transplant status: Principal | ICD-10-CM

## 2022-09-07 DIAGNOSIS — Z940 Kidney transplant status: Principal | ICD-10-CM

## 2022-09-25 ENCOUNTER — Ambulatory Visit: Admit: 2022-09-25 | Discharge: 2022-09-26 | Payer: MEDICARE

## 2022-09-25 ENCOUNTER — Other Ambulatory Visit
Admission: RE | Admit: 2022-09-25 | Discharge: 2022-09-25 | Disposition: A | Discharge status: Home / Self Care | Payer: Medicare Other | Attending: Nephrology | Admitting: Nephrology

## 2022-09-25 DIAGNOSIS — E1129 Type 2 diabetes mellitus with other diabetic kidney complication: Secondary | ICD-10-CM | POA: Diagnosis not present

## 2022-09-25 DIAGNOSIS — D899 Disorder involving the immune mechanism, unspecified: Secondary | ICD-10-CM | POA: Diagnosis not present

## 2022-09-25 DIAGNOSIS — Z940 Kidney transplant status: Secondary | ICD-10-CM | POA: Diagnosis not present

## 2022-09-25 DIAGNOSIS — Z09 Encounter for follow-up examination after completed treatment for conditions other than malignant neoplasm: Secondary | ICD-10-CM | POA: Insufficient documentation

## 2022-09-25 DIAGNOSIS — N390 Urinary tract infection, site not specified: Secondary | ICD-10-CM | POA: Diagnosis not present

## 2022-09-25 DIAGNOSIS — Z79899 Other long term (current) drug therapy: Secondary | ICD-10-CM | POA: Insufficient documentation

## 2022-09-25 DIAGNOSIS — B259 Cytomegaloviral disease, unspecified: Secondary | ICD-10-CM | POA: Diagnosis not present

## 2022-09-25 DIAGNOSIS — Z9483 Pancreas transplant status: Secondary | ICD-10-CM | POA: Insufficient documentation

## 2022-09-25 DIAGNOSIS — D631 Anemia in chronic kidney disease: Secondary | ICD-10-CM | POA: Diagnosis not present

## 2022-09-25 DIAGNOSIS — Z114 Encounter for screening for human immunodeficiency virus [HIV]: Secondary | ICD-10-CM | POA: Diagnosis not present

## 2022-09-25 DIAGNOSIS — Z789 Other specified health status: Secondary | ICD-10-CM | POA: Insufficient documentation

## 2022-09-25 DIAGNOSIS — E559 Vitamin D deficiency, unspecified: Secondary | ICD-10-CM | POA: Insufficient documentation

## 2022-09-25 LAB — CBC WITH DIFFERENTIAL/PLATELET
Abs Immature Granulocytes: 0.13 10*3/uL — ABNORMAL HIGH (ref 0.00–0.07)
Basophils Absolute: 0 10*3/uL (ref 0.0–0.1)
Basophils Relative: 1 %
Eosinophils Absolute: 0.1 10*3/uL (ref 0.0–0.5)
Eosinophils Relative: 1 %
HCT: 42 % (ref 36.0–46.0)
Hemoglobin: 13.8 g/dL (ref 12.0–15.0)
Immature Granulocytes: 2 %
Lymphocytes Relative: 29 %
Lymphs Abs: 2.3 10*3/uL (ref 0.7–4.0)
MCH: 28.3 pg (ref 26.0–34.0)
MCHC: 32.9 g/dL (ref 30.0–36.0)
MCV: 86.2 fL (ref 80.0–100.0)
Monocytes Absolute: 1.1 10*3/uL — ABNORMAL HIGH (ref 0.1–1.0)
Monocytes Relative: 14 %
Neutro Abs: 4.3 10*3/uL (ref 1.7–7.7)
Neutrophils Relative %: 53 %
Platelets: 602 10*3/uL — ABNORMAL HIGH (ref 150–400)
RBC: 4.87 MIL/uL (ref 3.87–5.11)
RDW: 15.6 % — ABNORMAL HIGH (ref 11.5–15.5)
WBC: 8 10*3/uL (ref 4.0–10.5)
nRBC: 0 % (ref 0.0–0.2)

## 2022-09-25 LAB — BASIC METABOLIC PANEL
Anion gap: 6 (ref 5–15)
BUN: 34 mg/dL — ABNORMAL HIGH (ref 8–23)
CO2: 27 mmol/L (ref 22–32)
Calcium: 9.1 mg/dL (ref 8.9–10.3)
Chloride: 100 mmol/L (ref 98–111)
Creatinine, Ser: 1.18 mg/dL — ABNORMAL HIGH (ref 0.44–1.00)
GFR, Estimated: 48 mL/min — ABNORMAL LOW (ref >60)
Glucose, Bld: 90 mg/dL (ref 70–99)
Potassium: 4.5 mmol/L (ref 3.5–5.1)
Sodium: 133 mmol/L — ABNORMAL LOW (ref 135–145)

## 2022-09-25 LAB — PHOSPHORUS: Phosphorus: 3.7 mg/dL (ref 2.5–4.6)

## 2022-09-25 LAB — MAGNESIUM: Magnesium: 2.1 mg/dL (ref 1.7–2.4)

## 2022-10-02 DIAGNOSIS — Z940 Kidney transplant status: Principal | ICD-10-CM

## 2022-10-14 DIAGNOSIS — Z940 Kidney transplant status: Principal | ICD-10-CM

## 2022-10-17 MED ORDER — EVEROLIMUS (IMMUNOSUPPRESSIVE) 0.5 MG TABLET
ORAL_TABLET | Freq: Two times a day (BID) | ORAL | 3 refills | 90 days | Status: CP
Start: 2022-10-17 — End: 2023-10-17

## 2022-10-23 ENCOUNTER — Encounter (INDEPENDENT_AMBULATORY_CARE_PROVIDER_SITE_OTHER): Payer: Self-pay

## 2022-10-23 ENCOUNTER — Other Ambulatory Visit (INDEPENDENT_AMBULATORY_CARE_PROVIDER_SITE_OTHER): Payer: Self-pay | Admitting: Nurse Practitioner

## 2022-10-23 DIAGNOSIS — Z9889 Other specified postprocedural states: Secondary | ICD-10-CM

## 2022-10-25 ENCOUNTER — Ambulatory Visit: Admit: 2022-10-25 | Discharge: 2022-10-26 | Payer: MEDICARE

## 2022-10-25 ENCOUNTER — Other Ambulatory Visit
Admission: RE | Admit: 2022-10-25 | Discharge: 2022-10-25 | Disposition: A | Discharge status: Home / Self Care | Payer: Medicare Other | Attending: Nephrology | Admitting: Nephrology

## 2022-10-25 DIAGNOSIS — E559 Vitamin D deficiency, unspecified: Secondary | ICD-10-CM | POA: Diagnosis not present

## 2022-10-25 DIAGNOSIS — N390 Urinary tract infection, site not specified: Secondary | ICD-10-CM | POA: Insufficient documentation

## 2022-10-25 DIAGNOSIS — Z114 Encounter for screening for human immunodeficiency virus [HIV]: Secondary | ICD-10-CM | POA: Diagnosis not present

## 2022-10-25 DIAGNOSIS — Z09 Encounter for follow-up examination after completed treatment for conditions other than malignant neoplasm: Secondary | ICD-10-CM | POA: Diagnosis present

## 2022-10-25 DIAGNOSIS — Z940 Kidney transplant status: Secondary | ICD-10-CM | POA: Insufficient documentation

## 2022-10-25 DIAGNOSIS — Z789 Other specified health status: Secondary | ICD-10-CM | POA: Diagnosis not present

## 2022-10-25 DIAGNOSIS — Z9483 Pancreas transplant status: Secondary | ICD-10-CM | POA: Insufficient documentation

## 2022-10-25 DIAGNOSIS — D899 Disorder involving the immune mechanism, unspecified: Secondary | ICD-10-CM | POA: Insufficient documentation

## 2022-10-25 DIAGNOSIS — B259 Cytomegaloviral disease, unspecified: Secondary | ICD-10-CM | POA: Diagnosis not present

## 2022-10-25 DIAGNOSIS — E1129 Type 2 diabetes mellitus with other diabetic kidney complication: Secondary | ICD-10-CM | POA: Diagnosis not present

## 2022-10-25 DIAGNOSIS — D631 Anemia in chronic kidney disease: Secondary | ICD-10-CM | POA: Diagnosis not present

## 2022-10-25 DIAGNOSIS — Z79899 Other long term (current) drug therapy: Secondary | ICD-10-CM | POA: Diagnosis not present

## 2022-10-25 LAB — CBC WITH DIFFERENTIAL/PLATELET
Abs Immature Granulocytes: 0.07 10*3/uL (ref 0.00–0.07)
Basophils Absolute: 0 10*3/uL (ref 0.0–0.1)
Basophils Relative: 0 %
Eosinophils Absolute: 0.1 10*3/uL (ref 0.0–0.5)
Eosinophils Relative: 1 %
HCT: 40.7 % (ref 36.0–46.0)
Hemoglobin: 13.8 g/dL (ref 12.0–15.0)
Immature Granulocytes: 1 %
Lymphocytes Relative: 22 %
Lymphs Abs: 1.9 10*3/uL (ref 0.7–4.0)
MCH: 28.7 pg (ref 26.0–34.0)
MCHC: 33.9 g/dL (ref 30.0–36.0)
MCV: 84.6 fL (ref 80.0–100.0)
Monocytes Absolute: 1.2 10*3/uL — ABNORMAL HIGH (ref 0.1–1.0)
Monocytes Relative: 14 %
Neutro Abs: 5.5 10*3/uL (ref 1.7–7.7)
Neutrophils Relative %: 62 %
Platelets: 592 10*3/uL — ABNORMAL HIGH (ref 150–400)
RBC: 4.81 MIL/uL (ref 3.87–5.11)
RDW: 15.7 % — ABNORMAL HIGH (ref 11.5–15.5)
WBC: 8.9 10*3/uL (ref 4.0–10.5)
nRBC: 0 % (ref 0.0–0.2)

## 2022-10-25 LAB — BASIC METABOLIC PANEL
Anion gap: 7 (ref 5–15)
BUN: 42 mg/dL — ABNORMAL HIGH (ref 8–23)
CO2: 26 mmol/L (ref 22–32)
Calcium: 9 mg/dL (ref 8.9–10.3)
Chloride: 101 mmol/L (ref 98–111)
Creatinine, Ser: 1.1 mg/dL — ABNORMAL HIGH (ref 0.44–1.00)
GFR, Estimated: 52 mL/min — ABNORMAL LOW (ref >60)
Glucose, Bld: 113 mg/dL — ABNORMAL HIGH (ref 70–99)
Potassium: 4.3 mmol/L (ref 3.5–5.1)
Sodium: 134 mmol/L — ABNORMAL LOW (ref 135–145)

## 2022-10-25 LAB — PHOSPHORUS: Phosphorus: 3.7 mg/dL (ref 2.5–4.6)

## 2022-10-25 LAB — MAGNESIUM: Magnesium: 2.2 mg/dL (ref 1.7–2.4)

## 2022-10-26 ENCOUNTER — Ambulatory Visit (INDEPENDENT_AMBULATORY_CARE_PROVIDER_SITE_OTHER): Payer: Medicare Other | Admitting: Vascular Surgery

## 2022-10-26 ENCOUNTER — Encounter (INDEPENDENT_AMBULATORY_CARE_PROVIDER_SITE_OTHER): Payer: Self-pay | Admitting: Vascular Surgery

## 2022-10-26 ENCOUNTER — Ambulatory Visit (INDEPENDENT_AMBULATORY_CARE_PROVIDER_SITE_OTHER): Payer: Medicare Other

## 2022-10-26 VITALS — BP 124/70 | HR 82 | Resp 19 | Ht 62.0 in | Wt 154.6 lb

## 2022-10-26 DIAGNOSIS — E1159 Type 2 diabetes mellitus with other circulatory complications: Secondary | ICD-10-CM

## 2022-10-26 DIAGNOSIS — K219 Gastro-esophageal reflux disease without esophagitis: Secondary | ICD-10-CM | POA: Diagnosis not present

## 2022-10-26 DIAGNOSIS — I739 Peripheral vascular disease, unspecified: Secondary | ICD-10-CM

## 2022-10-26 DIAGNOSIS — I70213 Atherosclerosis of native arteries of extremities with intermittent claudication, bilateral legs: Secondary | ICD-10-CM

## 2022-10-26 DIAGNOSIS — Z9889 Other specified postprocedural states: Secondary | ICD-10-CM

## 2022-10-26 DIAGNOSIS — Z794 Long term (current) use of insulin: Secondary | ICD-10-CM

## 2022-10-26 DIAGNOSIS — E782 Mixed hyperlipidemia: Secondary | ICD-10-CM

## 2022-10-26 DIAGNOSIS — I10 Essential (primary) hypertension: Secondary | ICD-10-CM | POA: Diagnosis not present

## 2022-10-30 ENCOUNTER — Encounter (INDEPENDENT_AMBULATORY_CARE_PROVIDER_SITE_OTHER): Payer: Self-pay | Admitting: Vascular Surgery

## 2022-10-30 DIAGNOSIS — Z940 Kidney transplant status: Principal | ICD-10-CM

## 2022-10-30 NOTE — Progress Notes (Signed)
MRN : [id number]  Cindy Fischer is a 76 y.o. ([birth year]) female who presents with chief complaint of check circulation.  History of Present Illness:   The patient returns to the office for followup and review of the noninvasive studies. There have been no interval changes in lower extremity symptoms. No interval shortening of the patient's claudication distance or development of rest pain symptoms. No new ulcers or wounds have occurred since the last visit.   Since her last visit she has had a subdural hematoma and several episodes of hypoglycemia.  These are currently being treated.   The patient denies amaurosis fugax or recent TIA symptoms. There are no recent neurological changes noted. The patient denies history of DVT, PE or superficial thrombophlebitis. The patient denies recent episodes of angina or shortness of breath.    ABI Rt=[place] (TBI=0.86) and Lt=[place] (TBI=0.90)  (previous ABI's Rt= (TBI=0.53) and Lt= (TBI=0.60))   Duplex ultrasound of the bilateral lower extremity arterial system demonstrates patency without focal hemodynamically significant stenosis.  Current Meds  Medication Sig   acetaminophen (TYLENOL) 650 MG CR tablet Take 1,300 mg by mouth every 8 (eight) hours as needed for pain.   aspirin EC 81 MG tablet Take 81 mg by mouth daily.   atorvastatin (LIPITOR) 20 MG tablet Take 20 mg by mouth daily.   Cholecalciferol (VITAMIN D) 2000 units tablet Take 2,000 Units by mouth daily.   clopidogrel (PLAVIX) 75 MG tablet TAKE 1 TABLET BY MOUTH EVERY DAY   Everolimus 0.5 MG TABS Take 1 mg by mouth 2 (two) times daily.   insulin NPH Human (HUMULIN N,NOVOLIN N) 100 UNIT/ML injection Inject 23 Units into the skin daily before breakfast.   insulin regular (NOVOLIN R,HUMULIN R) 100 units/mL injection Inject 3-10 Units into the skin 3 (three) times daily as needed for high blood sugar. Sliding scale   levothyroxine (SYNTHROID, LEVOTHROID) 100 MCG tablet Take 100  mcg by mouth See admin instructions. Take 100 mcg daily except skip dose on Saturdays   lisinopril (PRINIVIL,ZESTRIL) 10 MG tablet Take 10 mg by mouth daily.   omeprazole (PRILOSEC) 40 MG capsule Take 40 mg by mouth every other day.    predniSONE (DELTASONE) 5 MG tablet Take 5 mg by mouth daily.    tacrolimus (PROGRAF) 0.5 MG capsule Take 1-1.5 mg by mouth See admin instructions. Take 1.5 mg in the morning and 1 mg in the evening   torsemide (DEMADEX) 20 MG tablet Take 20 mg by mouth daily as needed.    Past Medical History:  Diagnosis Date   Cancer ([place]) 2016   skin (nose)   Diabetes mellitus without complication (HCC)    End stage renal disease (HCC)    s/p renal transplant   Hypothyroidism    Pneumonia    Renal disorder     Past Surgical History:  Procedure Laterality Date   CATARACT EXTRACTION, BILATERAL     COLONOSCOPY  12/02/2010   COLONOSCOPY WITH PROPOFOL N/A 09/04/2016   Procedure: COLONOSCOPY WITH PROPOFOL;  Surgeon: Christene Lye, MD;  Location: [facility];  Service: Endoscopy;  Laterality: N/A;   EYE SURGERY     INSERTION OF DIALYSIS CATHETER  2014   removed 2014 (only had in for about 8 weeks)   laser surgery on eyes  1999   LOWER EXTREMITY ANGIOGRAPHY Right 10/16/2018   Procedure: LOWER EXTREMITY ANGIOGRAPHY;  Surgeon: Katha Cabal, MD;  Location: [place] [facility];  Service: Cardiovascular;  Laterality: Right;   LOWER EXTREMITY ANGIOGRAPHY Right 02/08/2021   Procedure: LOWER EXTREMITY ANGIOGRAPHY;  Surgeon: Katha Cabal, MD;  Location: [place] [facility];  Service: Cardiovascular;  Laterality: Right;   LOWER EXTREMITY ANGIOGRAPHY Right 03/02/2021   Procedure: LOWER EXTREMITY ANGIOGRAPHY;  Surgeon: Katha Cabal, MD;  Location: [facility];  Service: Cardiovascular;  Laterality: Right;   NEPHRECTOMY TRANSPLANTED ORGAN  10/20/2014   SPINE SURGERY  11/10/2021   TONSILLECTOMY     TUBAL LIGATION      Social History Social  History   Tobacco Use   Smoking status: Former    Packs/day: 1.00    Years: 35.00    Total pack years: 35.00    Types: Cigarettes    Quit date: 2015    Years since quitting: 8.8   Smokeless tobacco: Never  Vaping Use   Vaping Use: Never used  Substance Use Topics   Alcohol use: No   Drug use: No    Family History Family History  Problem Relation Age of Onset   Multiple myeloma Mother    Diabetes Father    Heart attack Father 15   Breast cancer Neg Hx     Allergies  Allergen Reactions   Latex Itching   Voltaren [Diclofenac Sodium] Diarrhea and Nausea And Vomiting   Oxycodone-Acetaminophen Nausea And Vomiting   Penicillins Rash    Has patient had a PCN reaction causing immediate rash, facial/tongue/throat swelling, SOB or lightheadedness with hypotension: Yes Has patient had a PCN reaction causing severe rash involving mucus membranes or skin necrosis: No Has patient had a PCN reaction that required hospitalization: No Has patient had a PCN reaction occurring within the last 10 years: No If all of the above answers are "NO", then may proceed with Cephalosporin use.      REVIEW OF SYSTEMS (Negative unless checked)  Constitutional: _0 Weight loss  _1 Fever  _2 Chills Cardiac: _3 Chest pain   _4 Chest pressure   _5 Palpitations   _6 Shortness of breath when laying flat   _7 Shortness of breath with exertion. Vascular:  _8 Pain in legs with walking   _9 Pain in legs at rest  _10 History of DVT   _11 Phlebitis   _12 Swelling in legs   _13 Varicose veins   _14 Non-healing ulcers Pulmonary:   _15 Uses home oxygen   _16 Productive cough   _17 Hemoptysis   _18 Wheeze  _19 COPD   _20 Asthma Neurologic:  _21 Dizziness   _22 Seizures   _23 History of stroke   _24 History of TIA  _25 Aphasia   _26 Vissual changes   _27 Weakness or numbness in arm   _28 Weakness or numbness in leg Musculoskeletal:   _29 Joint swelling   _30 Joint pain   _31 Low back pain Hematologic:  _32 Easy bruising  _33 Easy bleeding   _34 Hypercoagulable state    _35 Anemic Gastrointestinal:  _36 Diarrhea   _37 Vomiting  _38 Gastroesophageal reflux/heartburn   _39 Difficulty swallowing. Genitourinary:  _40 Chronic kidney disease   _41 Difficult urination  _42 Frequent urination   _43 Blood in urine Skin:  _44 Rashes   _45 Ulcers  Psychological:  _46 History of anxiety   _47  History of major depression.  Physical Examination  Vitals:   10/26/22 1452  BP: 124/70  Pulse: 82  Resp: 19  Weight: 154 lb 9.6 oz (70.1 kg)  Height: _48  (1.575 m)   Body mass index is 28.28 kg/m. Gen: WD/WN, NAD Head: /AT, No temporalis wasting.  Ear/Nose/Throat: Hearing grossly intact, nares w/o erythema or drainage Eyes: PER, EOMI, sclera nonicteric.  Neck: Supple, no masses.  No bruit or JVD.  Pulmonary:  Good air movement, no audible wheezing, no use of accessory muscles.  Cardiac: RRR, normal S1, S2, no Murmurs. Vascular:  mild trophic changes, no open wounds Vessel Right Left  Radial Palpable Palpable  PT Not Palpable Not Palpable  DP Not Palpable Not Palpable  Gastrointestinal: soft, non-distended. No guarding/no peritoneal signs.  Musculoskeletal: M/S 5/5 throughout.  No visible deformity.  Neurologic: CN 2-12 intact. Pain and light touch intact in extremities.  Symmetrical.  Speech is fluent. Motor exam as listed above. Psychiatric: Judgment intact, Mood & affect appropriate for pt's clinical situation. Dermatologic: No rashes or ulcers noted.  No changes consistent with cellulitis.   CBC Lab Results  Component Value Date   WBC 8.9 10/25/2022   HGB 13.8 10/25/2022   HCT 40.7 10/25/2022   MCV 84.6 10/25/2022   PLT 592 (H) 10/25/2022    BMET    Component Value Date/Time   NA 134 (L) 10/25/2022 0822   NA 138 03/29/2015 0918   K 4.3 10/25/2022 0822   K 4.5 03/29/2015 0918   CL 101 10/25/2022 0822   CL 104 03/29/2015 0918   CO2 26 10/25/2022 0822   CO2 26 03/29/2015 0918   GLUCOSE 113 (H) 10/25/2022 0822   GLUCOSE 187 (H) 03/29/2015 0918   BUN 42 (H)  10/25/2022 0822   BUN 10 03/29/2015 0918   CREATININE 1.10 (H) 10/25/2022 0822   CREATININE 0.80 03/29/2015 0918   CALCIUM 9.0 10/25/2022 0822   CALCIUM 9.1 03/29/2015 0918   GFRNONAA 52 (L) 10/25/2022 0822   GFRNONAA >60 03/29/2015 0918   GFRAA >60 09/24/2020 0836   GFRAA >60 03/29/2015 0918   Estimated Creatinine Clearance: 39.9 mL/min (A) (by C-G formula based on SCr of 1.1 mg/dL (H)).  COAG No results found for: "INR", "PROTIME"  Radiology VAS [place] ABI WITH/WO TBI  Result Date: 10/26/2022  LOWER EXTREMITY DOPPLER STUDY Patient Name:  Cindy Fischer  Date of Exam:   10/26/2022 Medical Rec #: [id number]      Accession #:    [id number] Date of Birth: 1946/02/11     Patient Gender: F Patient Age:   47 years Exam Location:  [facility] Procedure:      VAS [place] ABI WITH/WO TBI Referring Phys: Hortencia Pilar --------------------------------------------------------------------------------  Indications: Peripheral artery disease.  Vascular Interventions: 10/16/18: Right SFA/popliteal stent;                         02/08/21: Bilateral CIA stents;                         02/08/21: Right SFA/popliteal thrombectomy/PTA/stent;. Comparison Study: 04/20/2022 Performing Technologist: Almira Coaster RVS  Examination Guidelines: A complete evaluation includes at minimum, Doppler waveform signals and systolic blood pressure reading at the level of bilateral brachial, anterior tibial, and posterior tibial arteries, when vessel segments are accessible. Bilateral testing is considered an integral part of a complete examination. Photoelectric Plethysmograph (PPG) waveforms and toe systolic pressure readings are included as required and additional duplex testing as needed. Limited examinations for reoccurring indications may be performed as noted.  ABI Findings: +---------+------------------+-----+--------+--------+ Right    Rt Pressure (mmHg)IndexWaveformComment   +---------+------------------+-----+--------+--------+ Brachial 137                                     +---------+------------------+-----+--------+--------+  ATA      190               1.39 biphasicNC       +---------+------------------+-----+--------+--------+ PTA      156               1.14 biphasic         +---------+------------------+-----+--------+--------+ Great Toe118               0.86 Normal           +---------+------------------+-----+--------+--------+ +---------+------------------+-----+--------+-------+ Left     Lt Pressure (mmHg)IndexWaveformComment +---------+------------------+-----+--------+-------+ Brachial 130                                    +---------+------------------+-----+--------+-------+ ATA      213               1.55 biphasicNC      +---------+------------------+-----+--------+-------+ PTA      202               1.47 biphasicNC      +---------+------------------+-----+--------+-------+ Great Toe126               0.92 Normal          +---------+------------------+-----+--------+-------+ +-------+-----------+-----------+------------+------------+ ABI/TBIToday's ABIToday's TBIPrevious ABIPrevious TBI +-------+-----------+-----------+------------+------------+ Right  >1.0 [place]    .86        [place]          .53          +-------+-----------+-----------+------------+------------+ Left   >1.0 [place]    .92        [place]          .60          +-------+-----------+-----------+------------+------------+  Bilateral ABIs appear essentially unchanged compared to prior study on 04/20/2022. Bilateral TBIs appear increased compared to prior study on 04/20/2022.  Summary: Right: Resting right ankle-brachial index indicates noncompressible right lower extremity arteries. The right toe-brachial index is normal. Left: Resting left ankle-brachial index indicates noncompressible left lower extremity arteries. The left toe-brachial index is normal.  *See table(s) above for measurements and observations.  Electronically signed by Hortencia Pilar MD on 10/26/2022 at 5:53:16 PM.    Final    VAS [place] LOWER EXTREMITY ARTERIAL DUPLEX  Result Date: 10/26/2022 LOWER EXTREMITY ARTERIAL DUPLEX STUDY Patient Name:  Cindy Fischer  Date of Exam:   10/26/2022 Medical Rec #: [id number]      Accession #:    [id number] Date of Birth: 03/16/1946     Patient Gender: F Patient Age:   19 years Exam Location:  [facility] Procedure:      VAS [place] LOWER EXTREMITY ARTERIAL DUPLEX Referring Phys: Eulogio Ditch --------------------------------------------------------------------------------  Indications: Peripheral artery disease.  Vascular Interventions: 10/16/18: Right SFA/popliteal stent;                         02/08/21: Bilateral CIA stents;                         02/08/21: Right SFA/popliteal thrombectomy/PTA/stent;. Current ABI:            Rt >1.0 [place], Lt >1.0  Comparison Study: 04/20/2022 Performing Technologist: Almira Coaster RVS  Examination Guidelines: A complete evaluation includes B-mode imaging, spectral Doppler, color Doppler, and power Doppler as needed of all accessible portions of each vessel. Bilateral testing is considered an integral  part of a complete examination. Limited examinations for reoccurring indications may be performed as noted.  +----------+--------+-----+--------+--------+--------+ RIGHT     PSV cm/sRatioStenosisWaveformComments +----------+--------+-----+--------+--------+--------+ CFA Distal56                   biphasic         +----------+--------+-----+--------+--------+--------+ DFA       34                   biphasic         +----------+--------+-----+--------+--------+--------+ SFA Prox  75                   biphasic         +----------+--------+-----+--------+--------+--------+ SFA Mid   76                   biphasic         +----------+--------+-----+--------+--------+--------+ SFA Distal113                   biphasic         +----------+--------+-----+--------+--------+--------+ POP Distal62                   biphasic         +----------+--------+-----+--------+--------+--------+ ATA Distal89                   biphasic         +----------+--------+-----+--------+--------+--------+ PTA Distal91                   biphasic         +----------+--------+-----+--------+--------+--------+  +-----------+--------+-----+--------+----------+--------+ LEFT       PSV cm/sRatioStenosisWaveform  Comments +-----------+--------+-----+--------+----------+--------+ CFA Distal 47                   triphasic          +-----------+--------+-----+--------+----------+--------+ DFA        31                   biphasic           +-----------+--------+-----+--------+----------+--------+ SFA Prox   63                   triphasic          +-----------+--------+-----+--------+----------+--------+ SFA Mid    49                   biphasic           +-----------+--------+-----+--------+----------+--------+ SFA Distal 54                   biphasic           +-----------+--------+-----+--------+----------+--------+ POP Distal 74                   biphasic           +-----------+--------+-----+--------+----------+--------+ ATA Distal 81                   biphasic           +-----------+--------+-----+--------+----------+--------+ PTA Distal 65                   biphasic           +-----------+--------+-----+--------+----------+--------+ PERO Distal9                    monophasic         +-----------+--------+-----+--------+----------+--------+  Summary: Right: Imaging and Waveforms  obtained throughout in the Right Lower Extremity. Biphasic Waveforms seen predominantly. Left: Imaging and Waveforms obtained throughout in the Left Lower Extremity. Biphasic Waveforms seen predominantly.  See table(s) above for measurements and observations.  Electronically signed by Hortencia Pilar MD on 10/26/2022 at 5:53:02 PM.    Final      Assessment/Plan 1. Atherosclerosis of native artery of both lower extremities with intermittent claudication (HCC) Recommend:   The patient has evidence of atherosclerosis of the lower extremities with claudication.  The patient does not voice lifestyle limiting changes at this point in time.   Noninvasive studies do not suggest clinically significant change.   No invasive studies, angiography or surgery at this time The patient should continue walking and begin a more formal exercise program.  The patient should continue antiplatelet therapy and aggressive treatment of the lipid abnormalities   No changes in the patient's medications at this time   Continued surveillance is indicated as atherosclerosis is likely to progress with time.     - VAS [place] LOWER EXTREMITY ARTERIAL DUPLEX; Future - VAS [place] ABI WITH/WO TBI; Future  2. Essential hypertension Continue antihypertensive medications as already ordered, these medications have been reviewed and there are no changes at this time.  3. Gastroesophageal reflux disease without esophagitis Continue PPI as already ordered, this medication has been reviewed and there are no changes at this time.  Avoidence of caffeine and alcohol  Moderate elevation of the head of the bed   4. Type 2 diabetes mellitus with other circulatory complication, with long-term current use of insulin (HCC) Continue hypoglycemic medications as already ordered, these medications have been reviewed and there are no changes at this time.  Hgb A1C to be monitored as already arranged by primary service  5. Mixed hyperlipidemia Continue statin as ordered and reviewed, no changes at this time    Hortencia Pilar, MD  10/30/2022 11:24 AM

## 2022-11-09 DIAGNOSIS — E669 Obesity, unspecified: Principal | ICD-10-CM

## 2022-11-09 DIAGNOSIS — E1169 Type 2 diabetes mellitus with other specified complication: Principal | ICD-10-CM

## 2022-11-10 MED ORDER — LISINOPRIL 10 MG TABLET
ORAL_TABLET | 4 refills | 0 days | Status: CP
Start: 2022-11-10 — End: ?

## 2022-11-23 ENCOUNTER — Other Ambulatory Visit (INDEPENDENT_AMBULATORY_CARE_PROVIDER_SITE_OTHER): Payer: Self-pay | Admitting: Vascular Surgery

## 2022-11-24 ENCOUNTER — Ambulatory Visit: Admit: 2022-11-24 | Discharge: 2022-11-24 | Payer: MEDICARE

## 2022-11-27 DIAGNOSIS — Z940 Kidney transplant status: Principal | ICD-10-CM

## 2022-11-29 ENCOUNTER — Ambulatory Visit (INDEPENDENT_AMBULATORY_CARE_PROVIDER_SITE_OTHER): Payer: Medicare Other | Admitting: Podiatry

## 2022-11-29 DIAGNOSIS — M19071 Primary osteoarthritis, right ankle and foot: Secondary | ICD-10-CM | POA: Diagnosis not present

## 2022-12-03 NOTE — Progress Notes (Signed)
  Subjective:  Patient ID: Cindy Fischer, female    DOB: October 19, 1946,  MRN: [id number]  Chief Complaint  Patient presents with   Nail Problem    right great toe pain still    76 y.o. female presents with the above complaint. History confirmed with patient.  Pain today is a little bit more behind the nail and the joint itself  Objective:  Physical Exam: warm, good capillary refill, no trophic changes or ulcerative lesions, weakly palpable DP and PT pulses, and normal sensory exam.  Right Foot: Pain with palpation and range of motion of the interphalangeal joint of the hallux right  Assessment:   1. Degenerative arthritis of toe joint, right      Plan:  Patient was evaluated and treated and all questions answered.  Pain today was not on the nail but centered in the IPJ of the hallux.  Suspect she likely has degenerative arthritis.  We discussed treatment of this including corticosteroid injection.  I recommended this today.  Following verbal consent, 0.5 cc of 0.5% Marcaine and 10 mg of Kenalog were injected into the hallux IPJ after Betadine prep.  She tolerated well.  Follow-up as needed for this.  No follow-ups on file.

## 2022-12-19 DIAGNOSIS — Z940 Kidney transplant status: Principal | ICD-10-CM

## 2022-12-19 DIAGNOSIS — Z79899 Other long term (current) drug therapy: Principal | ICD-10-CM

## 2022-12-21 ENCOUNTER — Ambulatory Visit: Admit: 2022-12-21 | Discharge: 2022-12-21 | Payer: MEDICARE | Attending: Nephrology | Primary: Nephrology

## 2022-12-21 ENCOUNTER — Encounter: Admit: 2022-12-21 | Discharge: 2022-12-21 | Payer: MEDICARE | Attending: Nephrology | Primary: Nephrology

## 2022-12-21 ENCOUNTER — Ambulatory Visit: Admit: 2022-12-21 | Discharge: 2022-12-21 | Payer: MEDICARE

## 2022-12-21 DIAGNOSIS — Z79899 Other long term (current) drug therapy: Principal | ICD-10-CM

## 2022-12-21 DIAGNOSIS — Z940 Kidney transplant status: Principal | ICD-10-CM

## 2022-12-25 DIAGNOSIS — Z940 Kidney transplant status: Principal | ICD-10-CM

## 2023-01-16 DIAGNOSIS — Z79899 Other long term (current) drug therapy: Principal | ICD-10-CM

## 2023-01-16 DIAGNOSIS — Z940 Kidney transplant status: Principal | ICD-10-CM

## 2023-01-25 DIAGNOSIS — Z940 Kidney transplant status: Principal | ICD-10-CM

## 2023-01-25 DIAGNOSIS — Z79899 Other long term (current) drug therapy: Principal | ICD-10-CM

## 2023-01-25 MED ORDER — SIROLIMUS 0.5 MG TABLET
ORAL_TABLET | Freq: Every day | ORAL | 3 refills | 90 days | Status: CP
Start: 2023-01-25 — End: 2024-01-25
  Filled 2023-01-29: qty 60, 30d supply, fill #0

## 2023-01-25 NOTE — Unmapped (Signed)
[facility] Specialty Medication Onboarding    Specialty Medication: Sirolimus 0.5mg  tablet  Prior Authorization: Not Required   Financial Assistance: No - copay card or gant not available   Final Copay/Day Supply: $26.03 / 30 days    Insurance Restrictions: Yes - max 1 month supply     Notes to Pharmacist: Part B. Check if pt has supplement, if so can help reduce copay to $0    The triage team has completed the benefits investigation and has determined that the patient is able to fill this medication at [facility]. Please contact the patient to complete the onboarding or follow up with the prescribing physician as needed.

## 2023-01-25 NOTE — Unmapped (Signed)
Spoke with patient regarding Everolimus no longer being covered by insurance. Reviewed with Dr. Margaretmary Bayley and Cecilie Lowers, CPP, recommended to switch from Everolimus 1 mg BID to Sirolimus 1 mg daily. Cost from [facility] was acceptable for patient and will start taking Sirolimus after last dose of Everolimus. Labs drawn 1 week after starting. Patient verbalized understanding, no other needs at this time.

## 2023-01-25 NOTE — Unmapped (Addendum)
This onboarding is for the following medication:  1) Rapamune        [facility]   Patient Onboarding/Medication Counseling    Heidi Blankenship is a 77 y.o. female with a kidney transplant who I am counseling today on initiation of therapy.  I am speaking to the patient.    Was a Nurse, learning disability used for this call? No    Verified patient's date of birth / HIPAA.    Specialty medication(s) to be sent: Transplant: sirolimus 0.5mg       Non-specialty medications/supplies to be sent: none      Medications not needed at this time: none         Rapamune (sirolimus)    Medication & Administration     Dosage: Take 2 tablets (1mg  total) once daily.     Administration:   Take consistenly with or without food  Swallow tablets whole. Do not crush or chew.    Adherence/Missed dose instructions:  Take a missed dose as soon as you think about it.   If it is close to time for the next dose, skip the missed dose and go back to normal time  Do not take two doses at the same time or extra doses  Report any missed doses to transplant coordinator    Goals of Therapy     To prevent organ rejection    Side Effects & Monitoring Parameters     Common side effects  Headache  GI issues (stomach pain, diarrhea, constipation)  Joint pain  Pimples (acne)  Nose or throat irritation    The following side effects should be reported to the provider:  Allergic reaction (rash, hives, swelling, shortness of breath)  High blood pressure (headache, passing out, eyesight changes)  Electrolyte changes (muscle pain, weakness, cramps, abnormal heartbeat)  Arm or leg pain, swelling, numbness  Infection (fever, chills, pain on urination, wound not healing)  Bleeding (coughing up blood, in urine, unexplained bruise or bleed, menstrual changes)  Lung problems (breathing issues, cough)  Mental changes (confusion, memory issues, depression, eyesight change, strength on one side greater than the other, speaking or thinking difficulties, balance issues)  Extreme fatigue or weakness  Cardiac issues (chest pain, pressure, fast heartbeat)    Monitoring Parameters  Sirolimus levels  Hepatic and renal function  CBC  Cholesterol  Blood pressure      Contraindications, Warnings, & Precautions     Black Box Warning: Infections - immunosuppressant agents increase the risk of infection that may lead to hospitalization or death  Black Box Warning: Malignancy - immunosuppressant agents may be associated with the development of malignancies that may lead to hospitalization or death.  Limit or avoid sun and ultraviolet light exposure, use appropriate sun protection  Black Box Warning - avoid use in liver and lung transplantation  Risk of increased blood pressure  Abnormalities in blood sugar  Wound healing issues  Avoid pregnancy (use birth control before, during, and for 3 months after care ends)    Drug/Food Interactions     Medication list reviewed in Epic. No drug interactions identified.   Due to amount and severity of drug interactions, report ALL medications starts, discontinuations, and changes to transplant coordinator prior to making the change  Avoid alcohol  Avoid grapefruit or grapefruit juice  Avoid live vaccines    Storage, Handling Precautions, & Disposal     Store tablets at room temperature  Keep away from children and pets      Current Medications (  including OTC/herbals), Comorbidities and Allergies     Current Outpatient Medications   Medication Sig Dispense Refill    acetaminophen (TYLENOL 8 HOUR) 650 MG CR tablet Take 2 tablets (1,300 mg total) by mouth.      aspirin (ECOTRIN) 81 MG tablet Take 1 tablet (81 mg total) by mouth daily.      atorvastatin (LIPITOR) 20 MG tablet Take 1 tablet (20 mg total) by mouth daily. 30 tablet 11    cholecalciferol, vitamin D3, (VITAMIN D3) 2,000 unit Tab Take 1 tablet by mouth daily. 90 each 5    clopidogreL (PLAVIX) 75 mg tablet Take 1 tablet (75 mg total) by mouth daily.      glucagon HCL (GLUCAGON, HCL, EMERGENCY KIT) 1 mg SolR Inject 1 mg as directed for severe hypoglycemia 1 each 2    insulin ASPART (NOVOLOG FLEXPEN U-100 INSULIN) 100 unit/mL (3 mL) injection pen To be used before meals 10 mL 6    levothyroxine (SYNTHROID) 100 MCG tablet TAKE 1 TABLET BY MOUTH EVERY DAY (Patient taking differently: Take 1 tablet (100 mcg total) by mouth. Taking 100 mcg daily x 6 days/week.) 90 tablet 11    lisinopriL (PRINIVIL,ZESTRIL) 10 MG tablet TAKE 1 TABLET BY MOUTH EVERY DAY 90 tablet 4    metroNIDAZOLE (METROGEL) 0.75 % gel Apply topically Two (2) times a day. To nose and other flares on the face 60 g 5    NOVOLIN N NPH U-100 INSULIN 100 unit/mL injection INJECT 30 UNITS EVERY MORNING AND 6 UNITS EVERY EVENING      NOVOLIN R REGULAR U100 INSULIN 100 unit/mL injection INJECT 10 UNITS WITH BREAKFAST, 5 UNITS WITH LUNCH AND 5 UNITS WITH DINNER      omeprazole (PRILOSEC) 40 MG capsule Take 1 capsule (40 mg total) by mouth every other day.      predniSONE (DELTASONE) 5 MG tablet Take 1 tablet (5 mg total) by mouth daily. 30 tablet 11    sirolimus (RAPAMUNE) 0.5 mg tablet Take 2 tablets (1 mg total) by mouth in the morning. 180 tablet 3    tacrolimus (PROGRAF) 0.5 MG capsule Take 1.5 mg in AM and 1 mg in PM; z94.0 150 capsule 6    torsemide (DEMADEX) 20 MG tablet Take 1 tablet (20 mg total) by mouth daily as needed.       No current facility-administered medications for this visit.       Allergies   Allergen Reactions    Diclofenac Sodium Diarrhea and Nausea And Vomiting    Latex Itching    Penicillins Rash    Percocet [Oxycodone-Acetaminophen] Nausea And Vomiting       Patient Active Problem List   Diagnosis    Type II diabetes mellitus (CMS-HCC)    Hyperlipidemia    Proliferative diabetic retinopathy (CMS-HCC)    Aftercare following organ transplant    Hypothyroidism    Quit smoking within past year    Immunocompromised state (CMS-HCC)    Diabetes mellitus type 2 in obese (CMS-HCC)    Immunosuppression (CMS-HCC)    Breast swelling Thrombocytosis    Erythrocytosis    History of nonmelanoma skin cancer       Reviewed and up to date in Epic.    Appropriateness of Therapy     Acute infections noted within Epic:  No active infections  Patient reported infection: None    Is medication and dose appropriate based on diagnosis and infection status? Yes    Prescription has been clinically reviewed: Yes  Baseline Quality of Life Assessment      How many days over the past month did your kidney transplant  keep you from your normal activities? For example, brushing your teeth or getting up in the morning. 0    Financial Information     Medication Assistance provided: None Required    Anticipated copay of $26.03 reviewed with patient. Verified delivery address.    Delivery Information     Scheduled delivery date: 01/26/23    Expected start date: 01/26/23    Medication will be delivered via Same Day Courier to the prescription address in [facility].  This shipment will not require a signature.      Explained the services we provide at [facility] and that each month we would call to set up refills.  Stressed importance of returning phone calls so that we could ensure they receive their medications in time each month.  Informed patient that we should be setting up refills 7-10 days prior to when they will run out of medication.  A pharmacist will reach out to perform a clinical assessment periodically.  Informed patient that a welcome packet, containing information about our pharmacy and other support services, a Notice of Privacy Practices, and a drug information handout will be sent.      The patient or caregiver noted above participated in the development of this care plan and knows that they can request review of or adjustments to the care plan at any time.      Patient or caregiver verbalized understanding of the above information as well as how to contact the pharmacy at [phone] option 4 with any questions/concerns.  The pharmacy is open Monday through Friday 8:30am-4:30pm.  A pharmacist is available 24/7 via pager to answer any clinical questions they may have.    Patient Specific Needs     Does the patient have any physical, cognitive, or cultural barriers? No    Does the patient have adequate living arrangements? (i.e. the ability to store and take their medication appropriately) Yes    Did you identify any home environmental safety or security hazards? No    Patient prefers to have medications discussed with  Patient     Is the patient or caregiver able to read and understand education materials at a high school level or above? No    Patient's primary language is  English     Is the patient high risk? No    SOCIAL DETERMINANTS OF HEALTH     At the [facility], we have learned that life circumstances - like trouble affording food, housing, utilities, or transportation can affect the health of many of our patients.   That is why we wanted to ask: are you currently experiencing any life circumstances that are negatively impacting your health and/or quality of life? No    Social Determinants of Psychologist, prison and probation services Strain: Not on file   Internet Connectivity: Not on file   Food Insecurity: Not on file   Tobacco Use: Medium Risk (12/21/2022)    Patient History     Smoking Tobacco Use: Former     Smokeless Tobacco Use: Never     Passive Exposure: Not on file   Housing/Utilities: Not on file   Alcohol Use: Not on file   Transportation Needs: Not on file   Substance Use: Not on file   Health Literacy: Not on file   Physical Activity: Not on file  Interpersonal Safety: Not on file   Stress: Not on file   Intimate Partner Violence: Not on file   Depression: Not on file   Social Connections: Not on file       Would you be willing to receive help with any of the needs that you have identified today? Not applicable       Tera Helper, [facility]  [facility] Specialty Pharmacist

## 2023-01-29 ENCOUNTER — Ambulatory Visit: Admit: 2023-01-29 | Discharge: 2023-01-30 | Payer: MEDICARE

## 2023-01-29 ENCOUNTER — Other Ambulatory Visit
Admission: RE | Admit: 2023-01-29 | Discharge: 2023-01-29 | Disposition: A | Discharge status: Home / Self Care | Payer: Medicare Other | Attending: Nephrology | Admitting: Nephrology

## 2023-01-29 DIAGNOSIS — Z114 Encounter for screening for human immunodeficiency virus [HIV]: Secondary | ICD-10-CM | POA: Insufficient documentation

## 2023-01-29 DIAGNOSIS — Z789 Other specified health status: Secondary | ICD-10-CM | POA: Insufficient documentation

## 2023-01-29 DIAGNOSIS — E1129 Type 2 diabetes mellitus with other diabetic kidney complication: Secondary | ICD-10-CM | POA: Diagnosis not present

## 2023-01-29 DIAGNOSIS — B259 Cytomegaloviral disease, unspecified: Secondary | ICD-10-CM | POA: Diagnosis not present

## 2023-01-29 DIAGNOSIS — N390 Urinary tract infection, site not specified: Secondary | ICD-10-CM | POA: Insufficient documentation

## 2023-01-29 DIAGNOSIS — Z09 Encounter for follow-up examination after completed treatment for conditions other than malignant neoplasm: Secondary | ICD-10-CM | POA: Insufficient documentation

## 2023-01-29 DIAGNOSIS — Z9483 Pancreas transplant status: Secondary | ICD-10-CM | POA: Insufficient documentation

## 2023-01-29 DIAGNOSIS — Z79899 Other long term (current) drug therapy: Secondary | ICD-10-CM | POA: Diagnosis not present

## 2023-01-29 DIAGNOSIS — D899 Disorder involving the immune mechanism, unspecified: Secondary | ICD-10-CM | POA: Diagnosis not present

## 2023-01-29 DIAGNOSIS — D631 Anemia in chronic kidney disease: Secondary | ICD-10-CM | POA: Insufficient documentation

## 2023-01-29 DIAGNOSIS — Z940 Kidney transplant status: Secondary | ICD-10-CM | POA: Insufficient documentation

## 2023-01-29 DIAGNOSIS — E559 Vitamin D deficiency, unspecified: Secondary | ICD-10-CM | POA: Insufficient documentation

## 2023-01-29 LAB — CBC WITH DIFFERENTIAL/PLATELET
Abs Immature Granulocytes: 0.13 10*3/uL — ABNORMAL HIGH (ref 0.00–0.07)
Basophils Absolute: 0 10*3/uL (ref 0.0–0.1)
Basophils Relative: 1 %
Eosinophils Absolute: 0.1 10*3/uL (ref 0.0–0.5)
Eosinophils Relative: 2 %
HCT: 40.3 % (ref 36.0–46.0)
Hemoglobin: 13.1 g/dL (ref 12.0–15.0)
Immature Granulocytes: 2 %
Lymphocytes Relative: 27 %
Lymphs Abs: 2.3 10*3/uL (ref 0.7–4.0)
MCH: 29 pg (ref 26.0–34.0)
MCHC: 32.5 g/dL (ref 30.0–36.0)
MCV: 89.4 fL (ref 80.0–100.0)
Monocytes Absolute: 1.1 10*3/uL — ABNORMAL HIGH (ref 0.1–1.0)
Monocytes Relative: 13 %
Neutro Abs: 4.7 10*3/uL (ref 1.7–7.7)
Neutrophils Relative %: 55 %
Platelets: 575 10*3/uL — ABNORMAL HIGH (ref 150–400)
RBC: 4.51 MIL/uL (ref 3.87–5.11)
RDW: 15.9 % — ABNORMAL HIGH (ref 11.5–15.5)
WBC: 8.3 10*3/uL (ref 4.0–10.5)
nRBC: 0 % (ref 0.0–0.2)

## 2023-01-29 LAB — BASIC METABOLIC PANEL
Anion gap: 7 (ref 5–15)
BUN: 27 mg/dL — ABNORMAL HIGH (ref 8–23)
CO2: 27 mmol/L (ref 22–32)
Calcium: 8.8 mg/dL — ABNORMAL LOW (ref 8.9–10.3)
Chloride: 99 mmol/L (ref 98–111)
Creatinine, Ser: 0.96 mg/dL (ref 0.44–1.00)
GFR, Estimated: >60 mL/min (ref >60)
Glucose, Bld: 115 mg/dL — ABNORMAL HIGH (ref 70–99)
Potassium: 4.2 mmol/L (ref 3.5–5.1)
Sodium: 133 mmol/L — ABNORMAL LOW (ref 135–145)

## 2023-01-29 LAB — PHOSPHORUS: Phosphorus: 3.5 mg/dL (ref 2.5–4.6)

## 2023-01-29 LAB — MAGNESIUM: Magnesium: 2.1 mg/dL (ref 1.7–2.4)

## 2023-01-29 NOTE — Unmapped (Signed)
Pt called requesting mailers, they were ordered.

## 2023-01-31 LAB — EVEROLIMUS: EVEROLIMUS LEVEL: 3.2 ng/mL (ref 3.0–15.0)

## 2023-02-12 DIAGNOSIS — Z940 Kidney transplant status: Principal | ICD-10-CM

## 2023-02-12 DIAGNOSIS — Z79899 Other long term (current) drug therapy: Principal | ICD-10-CM

## 2023-02-16 ENCOUNTER — Ambulatory Visit: Admit: 2023-02-16 | Discharge: 2023-02-17 | Payer: MEDICARE

## 2023-02-16 ENCOUNTER — Other Ambulatory Visit: Payer: Self-pay

## 2023-02-16 DIAGNOSIS — Z79899 Other long term (current) drug therapy: Principal | ICD-10-CM

## 2023-02-16 DIAGNOSIS — Z940 Kidney transplant status: Principal | ICD-10-CM

## 2023-02-16 DIAGNOSIS — Z1231 Encounter for screening mammogram for malignant neoplasm of breast: Secondary | ICD-10-CM

## 2023-02-16 LAB — URINALYSIS WITH MICROSCOPY
BILIRUBIN UA: NEGATIVE
BLOOD UA: NEGATIVE
GLUCOSE UA: 100 — AB
KETONES UA: NEGATIVE
LEUKOCYTE ESTERASE UA: NEGATIVE
NITRITE UA: NEGATIVE
PH UA: 6.5 (ref 5.0–9.0)
PROTEIN UA: NEGATIVE
RBC UA: <1 /HPF (ref <4)
SPECIFIC GRAVITY UA: 1.01 (ref 1.005–1.030)
SQUAMOUS EPITHELIAL: 1 /HPF (ref 0–5)
UROBILINOGEN UA: 0.2
WBC UA: 2 /HPF (ref 0–5)

## 2023-02-16 LAB — CBC W/ AUTO DIFF
BASOPHILS ABSOLUTE COUNT: 0.1 10*9/L (ref 0.0–0.1)
BASOPHILS RELATIVE PERCENT: 0.7 %
EOSINOPHILS ABSOLUTE COUNT: 0.1 10*9/L (ref 0.0–0.5)
EOSINOPHILS RELATIVE PERCENT: 1.1 %
HEMATOCRIT: 37.3 % (ref 34.0–44.0)
HEMOGLOBIN: 12.8 g/dL (ref 11.3–14.9)
LYMPHOCYTES ABSOLUTE COUNT: 2.8 10*9/L (ref 1.1–3.6)
LYMPHOCYTES RELATIVE PERCENT: 29.1 %
MEAN CORPUSCULAR HEMOGLOBIN CONC: 34.2 g/dL (ref 32.0–36.0)
MEAN CORPUSCULAR HEMOGLOBIN: 30 pg (ref 25.9–32.4)
MEAN CORPUSCULAR VOLUME: 87.5 fL (ref 77.6–95.7)
MEAN PLATELET VOLUME: 7.7 fL (ref 6.8–10.7)
MONOCYTES ABSOLUTE COUNT: 1.1 10*9/L — ABNORMAL HIGH (ref 0.3–0.8)
MONOCYTES RELATIVE PERCENT: 11.6 %
NEUTROPHILS ABSOLUTE COUNT: 5.5 10*9/L (ref 1.8–7.8)
NEUTROPHILS RELATIVE PERCENT: 57.5 %
NUCLEATED RED BLOOD CELLS: 0 /100{WBCs} (ref <=4)
PLATELET COUNT: 488 10*9/L — ABNORMAL HIGH (ref 150–450)
RED BLOOD CELL COUNT: 4.26 10*12/L (ref 3.95–5.13)
RED CELL DISTRIBUTION WIDTH: 15.7 % — ABNORMAL HIGH (ref 12.2–15.2)
WBC ADJUSTED: 9.5 10*9/L (ref 3.6–11.2)

## 2023-02-16 LAB — COMPREHENSIVE METABOLIC PANEL
ALBUMIN: 3.3 g/dL — ABNORMAL LOW (ref 3.4–5.0)
ALKALINE PHOSPHATASE: 54 U/L (ref 46–116)
ALT (SGPT): <7 U/L — ABNORMAL LOW (ref 10–49)
ANION GAP: 7 mmol/L (ref 5–14)
AST (SGOT): 16 U/L (ref <=34)
BILIRUBIN TOTAL: 1 mg/dL (ref 0.3–1.2)
BLOOD UREA NITROGEN: 28 mg/dL — ABNORMAL HIGH (ref 9–23)
BUN / CREAT RATIO: 25
CALCIUM: 9.1 mg/dL (ref 8.7–10.4)
CHLORIDE: 101 mmol/L (ref 98–107)
CO2: 24.2 mmol/L (ref 20.0–31.0)
CREATININE: 1.13 mg/dL — ABNORMAL HIGH
EGFR CKD-EPI (2021) FEMALE: 50 mL/min/{1.73_m2} — ABNORMAL LOW (ref >=60)
GLUCOSE RANDOM: 108 mg/dL — ABNORMAL HIGH (ref 70–99)
POTASSIUM: 4.6 mmol/L (ref 3.4–4.8)
PROTEIN TOTAL: 5.9 g/dL (ref 5.7–8.2)
SODIUM: 132 mmol/L — ABNORMAL LOW (ref 135–145)

## 2023-02-16 LAB — PROTEIN / CREATININE RATIO, URINE
CREATININE, URINE: 87.8 mg/dL
PROTEIN URINE: 6.4 mg/dL
PROTEIN/CREAT RATIO, URINE: 0.073

## 2023-02-16 LAB — PHOSPHORUS: PHOSPHORUS: 3.3 mg/dL (ref 2.4–5.1)

## 2023-02-16 LAB — ALBUMIN / CREATININE URINE RATIO
ALBUMIN QUANT URINE: 0.5 mg/dL
ALBUMIN/CREATININE RATIO: 5.7 ug/mg (ref 0.0–30.0)
CREATININE, URINE: 87.8 mg/dL

## 2023-02-16 LAB — MAGNESIUM: MAGNESIUM: 2.2 mg/dL (ref 1.6–2.6)

## 2023-02-16 LAB — SIROLIMUS LEVEL: SIROLIMUS LEVEL BLOOD: 2.9 ng/mL — ABNORMAL LOW (ref 3.0–20.0)

## 2023-02-16 LAB — EVEROLIMUS: EVEROLIMUS LEVEL: <2 ng/mL — ABNORMAL LOW (ref 3.0–15.0)

## 2023-02-17 LAB — EBV QUANTITATIVE PCR, BLOOD
EBV QUANT: <35 [IU]/mL — ABNORMAL HIGH (ref <0)
EBV VIRAL LOAD RESULT: DETECTED — AB

## 2023-02-17 LAB — CMV DNA, QUANTITATIVE, PCR: CMV VIRAL LD: NOT DETECTED

## 2023-02-17 LAB — BK VIRUS QUANTITATIVE PCR, BLOOD: BK BLOOD RESULT: NOT DETECTED

## 2023-02-19 DIAGNOSIS — Z940 Kidney transplant status: Principal | ICD-10-CM

## 2023-02-19 DIAGNOSIS — Z79899 Other long term (current) drug therapy: Principal | ICD-10-CM

## 2023-02-20 NOTE — Unmapped (Signed)
[facility] Clinical Assessment & Refill Coordination Note    Heidi Blankenship, DOB: 10-26-1946  Phone: There are no phone numbers on file.    All above HIPAA information was verified with patient.     Was a Nurse, learning disability used for this call? No    Specialty Medication(s):   Transplant: sirolimus 0.5mg      Current Outpatient Medications   Medication Sig Dispense Refill    acetaminophen (TYLENOL 8 HOUR) 650 MG CR tablet Take 2 tablets (1,300 mg total) by mouth.      aspirin (ECOTRIN) 81 MG tablet Take 1 tablet (81 mg total) by mouth daily.      atorvastatin (LIPITOR) 20 MG tablet Take 1 tablet (20 mg total) by mouth daily. 30 tablet 11    cholecalciferol, vitamin D3, (VITAMIN D3) 2,000 unit Tab Take 1 tablet by mouth daily. 90 each 5    clopidogreL (PLAVIX) 75 mg tablet Take 1 tablet (75 mg total) by mouth daily.      glucagon HCL (GLUCAGON, HCL, EMERGENCY KIT) 1 mg SolR Inject 1 mg as directed for severe hypoglycemia 1 each 2    insulin ASPART (NOVOLOG FLEXPEN U-100 INSULIN) 100 unit/mL (3 mL) injection pen To be used before meals 10 mL 6    levothyroxine (SYNTHROID) 100 MCG tablet TAKE 1 TABLET BY MOUTH EVERY DAY (Patient taking differently: Take 1 tablet (100 mcg total) by mouth. Taking 100 mcg daily x 6 days/week.) 90 tablet 11    lisinopriL (PRINIVIL,ZESTRIL) 10 MG tablet TAKE 1 TABLET BY MOUTH EVERY DAY 90 tablet 4    metroNIDAZOLE (METROGEL) 0.75 % gel Apply topically Two (2) times a day. To nose and other flares on the face 60 g 5    NOVOLIN N NPH U-100 INSULIN 100 unit/mL injection INJECT 30 UNITS EVERY MORNING AND 6 UNITS EVERY EVENING      NOVOLIN R REGULAR U100 INSULIN 100 unit/mL injection INJECT 10 UNITS WITH BREAKFAST, 5 UNITS WITH LUNCH AND 5 UNITS WITH DINNER      omeprazole (PRILOSEC) 40 MG capsule Take 1 capsule (40 mg total) by mouth every other day.      predniSONE (DELTASONE) 5 MG tablet Take 1 tablet (5 mg total) by mouth daily. 30 tablet 11    sirolimus (RAPAMUNE) 0.5 mg tablet Take 2 tablets (1 mg total) by mouth in the morning. 180 tablet 3    tacrolimus (PROGRAF) 0.5 MG capsule Take 1.5 mg in AM and 1 mg in PM; z94.0 150 capsule 6    torsemide (DEMADEX) 20 MG tablet Take 1 tablet (20 mg total) by mouth daily as needed.       No current facility-administered medications for this visit.        Changes to medications: [name] reports no changes at this time.    Allergies   Allergen Reactions    Diclofenac Sodium Diarrhea and Nausea And Vomiting    Latex Itching    Penicillins Rash    Percocet [Oxycodone-Acetaminophen] Nausea And Vomiting       Changes to allergies: No    SPECIALTY MEDICATION ADHERENCE     Sirolimus 0.5 mg: 10 days of medicine on hand     Medication Adherence    Patient reported X missed doses in the last month: 0  Specialty Medication: Sirolimus 0.5mg   Patient is on additional specialty medications: No          Specialty medication(s) dose(s) confirmed: Regimen is correct and unchanged.  Are there any concerns with adherence? No    Adherence counseling provided? Not needed    CLINICAL MANAGEMENT AND INTERVENTION      Clinical Benefit Assessment:    Do you feel the medicine is effective or helping your condition? Yes    Clinical Benefit counseling provided? Not needed    Adverse Effects Assessment:    Are you experiencing any side effects? No    Are you experiencing difficulty administering your medicine? No    Quality of Life Assessment:    Quality of Life    Rheumatology  Oncology  Dermatology  Cystic Fibrosis          How many days over the past month did your kidney transplant  keep you from your normal activities? For example, brushing your teeth or getting up in the morning. 0    Have you discussed this with your provider? Not needed    Acute Infection Status:    Acute infections noted within Epic:  No active infections  Patient reported infection: None    Therapy Appropriateness:    Is therapy appropriate and patient progressing towards therapeutic goals? Yes, therapy is appropriate and should be continued    DISEASE/MEDICATION-SPECIFIC INFORMATION      N/A    Solid Organ Transplant: Not Applicable    PATIENT SPECIFIC NEEDS     Does the patient have any physical, cognitive, or cultural barriers? No    Is the patient high risk? No    Did the patient require a clinical intervention? No    Does the patient require physician intervention or other additional services (i.e., nutrition, smoking cessation, social work)? No    SOCIAL DETERMINANTS OF HEALTH     At the [facility], we have learned that life circumstances - like trouble affording food, housing, utilities, or transportation can affect the health of many of our patients.   That is why we wanted to ask: are you currently experiencing any life circumstances that are negatively impacting your health and/or quality of life? No    Social Determinants of Psychologist, prison and probation services Strain: Not on file   Internet Connectivity: Not on file   Food Insecurity: Not on file   Tobacco Use: Medium Risk (12/21/2022)    Patient History     Smoking Tobacco Use: Former     Smokeless Tobacco Use: Never     Passive Exposure: Not on file   Housing/Utilities: Not on file   Alcohol Use: Not on file   Transportation Needs: Not on file   Substance Use: Not on file   Health Literacy: Not on file   Physical Activity: Not on file   Interpersonal Safety: Not on file   Stress: Not on file   Intimate Partner Violence: Not on file   Depression: Not on file   Social Connections: Not on file       Would you be willing to receive help with any of the needs that you have identified today? Not applicable       SHIPPING     Specialty Medication(s) to be Shipped:   Transplant: sirolimus 0.5mg     Other medication(s) to be shipped: No additional medications requested for fill at this time     Changes to insurance: No    Patient was informed of new phone menu: Yes    Delivery Scheduled: Yes, Expected medication delivery date: 03/01/23.     Medication will be delivered via Next Day Courier to  the confirmed prescription address in [facility].    The patient will receive a drug information handout for each medication shipped and additional FDA Medication Guides as required.  Verified that patient has previously received a Conservation officer, historic buildings and a Surveyor, mining.    The patient or caregiver noted above participated in the development of this care plan and knows that they can request review of or adjustments to the care plan at any time.      All of the patient's questions and concerns have been addressed.    Tera Helper, [facility]   [facility] Specialty Pharmacist

## 2023-02-28 MED FILL — SIROLIMUS 0.5 MG TABLET: ORAL | 30 days supply | Qty: 60 | Fill #1

## 2023-03-11 DIAGNOSIS — Z940 Kidney transplant status: Principal | ICD-10-CM

## 2023-03-11 MED ORDER — TACROLIMUS 0.5 MG CAPSULE, IMMEDIATE-RELEASE
ORAL_CAPSULE | 6 refills | 0 days
Start: 2023-03-11 — End: ?

## 2023-03-12 DIAGNOSIS — Z940 Kidney transplant status: Principal | ICD-10-CM

## 2023-03-12 DIAGNOSIS — Z79899 Other long term (current) drug therapy: Principal | ICD-10-CM

## 2023-03-12 MED ORDER — TACROLIMUS 0.5 MG CAPSULE, IMMEDIATE-RELEASE
ORAL_CAPSULE | 6 refills | 0 days | Status: CP
Start: 2023-03-12 — End: ?

## 2023-03-12 NOTE — Unmapped (Signed)
The patient is requesting a medication refill

## 2023-03-19 DIAGNOSIS — Z79899 Other long term (current) drug therapy: Principal | ICD-10-CM

## 2023-03-19 DIAGNOSIS — Z940 Kidney transplant status: Principal | ICD-10-CM

## 2023-03-22 NOTE — Unmapped (Signed)
[facility] Refill Coordination Note    Specialty Medication(s) to be Shipped:   Transplant: sirolimus 0.5mg     Other medication(s) to be shipped: No additional medications requested for fill at this time     Heidi Blankenship, DOB: December 18, 1946  Phone: There are no phone numbers on file.      All above HIPAA information was verified with patient.     Was a Nurse, learning disability used for this call? No    Completed refill call assessment today to schedule patient's medication shipment from the [facility] [phone]).  All relevant notes have been reviewed.     Specialty medication(s) and dose(s) confirmed: Regimen is correct and unchanged.   Changes to medications: Heidi Blankenship reports no changes at this time.  Changes to insurance: No  New side effects reported not previously addressed with a pharmacist or physician: None reported  Questions for the pharmacist: No    Confirmed patient received a Conservation officer, historic buildings and a Surveyor, mining with first shipment. The patient will receive a drug information handout for each medication shipped and additional FDA Medication Guides as required.       DISEASE/MEDICATION-SPECIFIC INFORMATION        N/A    SPECIALTY MEDICATION ADHERENCE     Medication Adherence    Patient reported X missed doses in the last month: 0  Specialty Medication: Sirolimus 0.5mg   Patient is on additional specialty medications: No              Were doses missed due to medication being on hold? No    Sirolimus 0.5 mg: 11 days of medicine on hand     REFERRAL TO PHARMACIST     Referral to the pharmacist: Not needed      [facility]     Shipping address confirmed in Epic.     Patient was notified of new phone menu : Yes    Delivery Scheduled: Yes, Expected medication delivery date: 03/30/23.     Medication will be delivered via Next Day Courier to the prescription address in Epic [facility].    Tera Helper, [facility]   [facility] Specialty Pharmacist

## 2023-03-26 ENCOUNTER — Other Ambulatory Visit
Admission: RE | Admit: 2023-03-26 | Discharge: 2023-03-26 | Disposition: A | Discharge status: Home / Self Care | Payer: Medicare Other | Attending: Nephrology | Admitting: Nephrology

## 2023-03-26 DIAGNOSIS — Z79899 Other long term (current) drug therapy: Secondary | ICD-10-CM | POA: Diagnosis not present

## 2023-03-26 DIAGNOSIS — E1129 Type 2 diabetes mellitus with other diabetic kidney complication: Secondary | ICD-10-CM | POA: Insufficient documentation

## 2023-03-26 DIAGNOSIS — T8610 Unspecified complication of kidney transplant: Secondary | ICD-10-CM | POA: Diagnosis not present

## 2023-03-26 DIAGNOSIS — B259 Cytomegaloviral disease, unspecified: Secondary | ICD-10-CM | POA: Diagnosis not present

## 2023-03-26 DIAGNOSIS — E559 Vitamin D deficiency, unspecified: Secondary | ICD-10-CM | POA: Insufficient documentation

## 2023-03-26 DIAGNOSIS — Z940 Kidney transplant status: Secondary | ICD-10-CM | POA: Insufficient documentation

## 2023-03-26 DIAGNOSIS — Z09 Encounter for follow-up examination after completed treatment for conditions other than malignant neoplasm: Secondary | ICD-10-CM | POA: Diagnosis not present

## 2023-03-26 DIAGNOSIS — Z9483 Pancreas transplant status: Secondary | ICD-10-CM | POA: Diagnosis not present

## 2023-03-26 DIAGNOSIS — D899 Disorder involving the immune mechanism, unspecified: Secondary | ICD-10-CM | POA: Insufficient documentation

## 2023-03-26 DIAGNOSIS — D631 Anemia in chronic kidney disease: Secondary | ICD-10-CM | POA: Diagnosis not present

## 2023-03-26 DIAGNOSIS — N390 Urinary tract infection, site not specified: Secondary | ICD-10-CM | POA: Insufficient documentation

## 2023-03-26 DIAGNOSIS — Z789 Other specified health status: Secondary | ICD-10-CM | POA: Insufficient documentation

## 2023-03-26 DIAGNOSIS — Z114 Encounter for screening for human immunodeficiency virus [HIV]: Secondary | ICD-10-CM | POA: Diagnosis not present

## 2023-03-26 LAB — BASIC METABOLIC PANEL
Anion gap: 8 (ref 5–15)
BUN: 29 mg/dL — ABNORMAL HIGH (ref 8–23)
CO2: 26 mmol/L (ref 22–32)
Calcium: 8.7 mg/dL — ABNORMAL LOW (ref 8.9–10.3)
Chloride: 103 mmol/L (ref 98–111)
Creatinine, Ser: 0.95 mg/dL (ref 0.44–1.00)
GFR, Estimated: >60 mL/min (ref >60)
Glucose, Bld: 85 mg/dL (ref 70–99)
Potassium: 3.9 mmol/L (ref 3.5–5.1)
Sodium: 137 mmol/L (ref 135–145)

## 2023-03-26 LAB — CBC WITH DIFFERENTIAL/PLATELET
Abs Immature Granulocytes: 0.14 10*3/uL — ABNORMAL HIGH (ref 0.00–0.07)
Basophils Absolute: 0 10*3/uL (ref 0.0–0.1)
Basophils Relative: 1 %
Eosinophils Absolute: 0.1 10*3/uL (ref 0.0–0.5)
Eosinophils Relative: 1 %
HCT: 40.2 % (ref 36.0–46.0)
Hemoglobin: 13 g/dL (ref 12.0–15.0)
Immature Granulocytes: 2 %
Lymphocytes Relative: 29 %
Lymphs Abs: 2.5 10*3/uL (ref 0.7–4.0)
MCH: 29.4 pg (ref 26.0–34.0)
MCHC: 32.3 g/dL (ref 30.0–36.0)
MCV: 91 fL (ref 80.0–100.0)
Monocytes Absolute: 1.1 10*3/uL — ABNORMAL HIGH (ref 0.1–1.0)
Monocytes Relative: 13 %
Neutro Abs: 4.7 10*3/uL (ref 1.7–7.7)
Neutrophils Relative %: 54 %
Platelets: 644 10*3/uL — ABNORMAL HIGH (ref 150–400)
RBC: 4.42 MIL/uL (ref 3.87–5.11)
RDW: 16.5 % — ABNORMAL HIGH (ref 11.5–15.5)
WBC: 8.6 10*3/uL (ref 4.0–10.5)
nRBC: 0 % (ref 0.0–0.2)

## 2023-03-26 LAB — MAGNESIUM: Magnesium: 1.9 mg/dL (ref 1.7–2.4)

## 2023-03-26 LAB — PHOSPHORUS: Phosphorus: 3.6 mg/dL (ref 2.5–4.6)

## 2023-03-26 NOTE — Unmapped (Signed)
Updated local lab orders

## 2023-03-28 DIAGNOSIS — Z79899 Other long term (current) drug therapy: Principal | ICD-10-CM

## 2023-03-28 DIAGNOSIS — Z940 Kidney transplant status: Principal | ICD-10-CM

## 2023-03-28 LAB — EVEROLIMUS: EVEROLIMUS LEVEL: <2 ng/mL — ABNORMAL LOW (ref 3.0–15.0)

## 2023-03-29 ENCOUNTER — Ambulatory Visit
Admission: RE | Admit: 2023-03-29 | Discharge: 2023-03-29 | Disposition: A | Discharge status: Home / Self Care | Payer: Medicare Other | Source: Ambulatory Visit | Attending: Internal Medicine | Admitting: Internal Medicine

## 2023-03-29 DIAGNOSIS — Z1231 Encounter for screening mammogram for malignant neoplasm of breast: Secondary | ICD-10-CM | POA: Insufficient documentation

## 2023-03-29 MED FILL — SIROLIMUS 0.5 MG TABLET: ORAL | 30 days supply | Qty: 60 | Fill #2

## 2023-04-09 DIAGNOSIS — Z79899 Other long term (current) drug therapy: Principal | ICD-10-CM

## 2023-04-09 DIAGNOSIS — Z940 Kidney transplant status: Principal | ICD-10-CM

## 2023-04-16 DIAGNOSIS — Z940 Kidney transplant status: Principal | ICD-10-CM

## 2023-04-16 DIAGNOSIS — Z79899 Other long term (current) drug therapy: Principal | ICD-10-CM

## 2023-04-17 IMAGING — CT CT CERVICAL SPINE W/O CM
3 of 4 series · 11 of 33 positions shown, 13 images · non-contrast
Comparison: None.

CLINICAL DATA: Unwitnessed fall 1 week ago.  Increasing confusion

EXAM:
CT HEAD WITHOUT CONTRAST
CT CERVICAL SPINE WITHOUT CONTRAST
TECHNIQUE: Multidetector CT imaging of the head and cervical spine was
performed following the standard protocol without intravenous
contrast. Multiplanar CT image reconstructions of the cervical spine
were also generated.

[Series 4: sagittal bone · sagittal · 0.27mm/px · 5 of 60 slices shown, 6 images]
[im 20/60  bone]
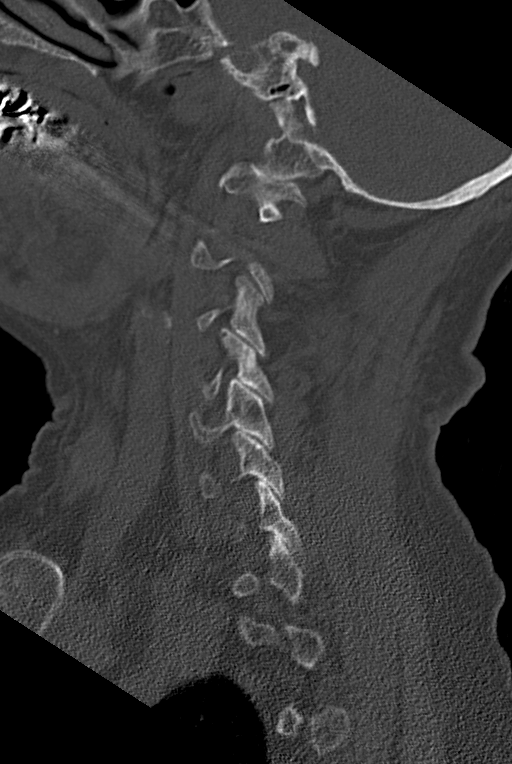
[im 25/60  bone]
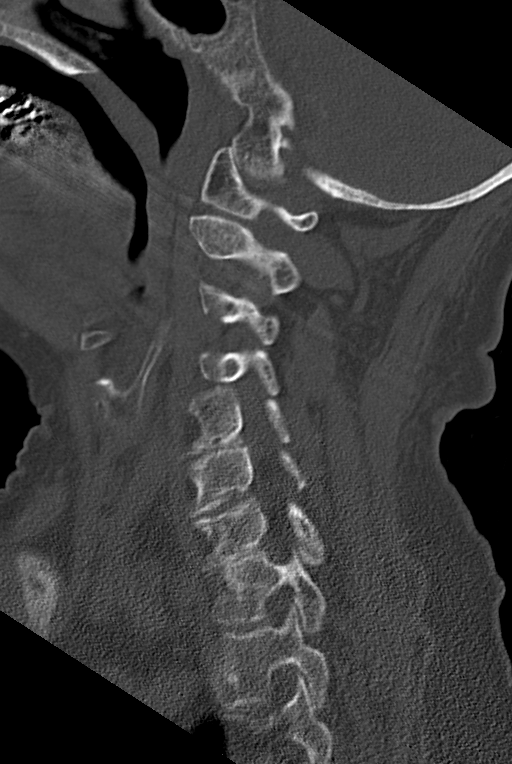
[im 30/60  soft-tissue]
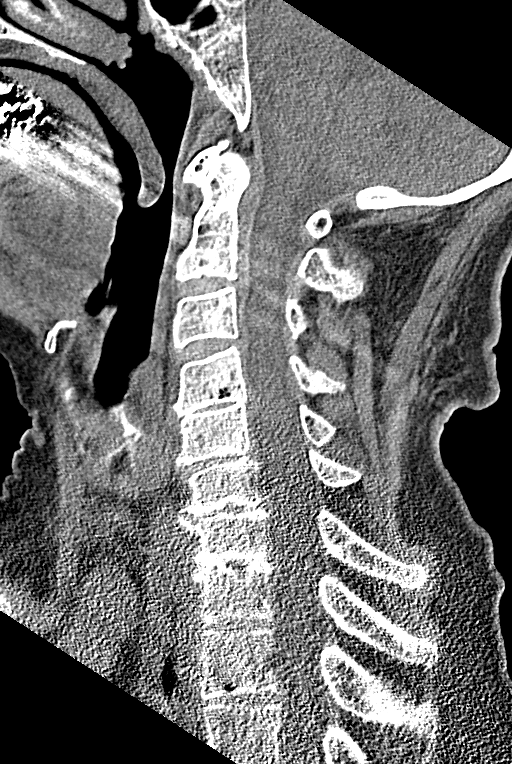
[im 30/60  bone]
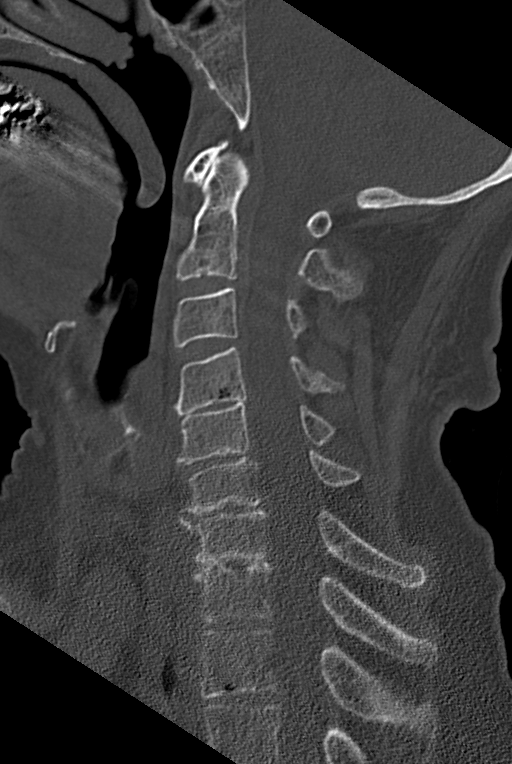
[im 35/60  bone]
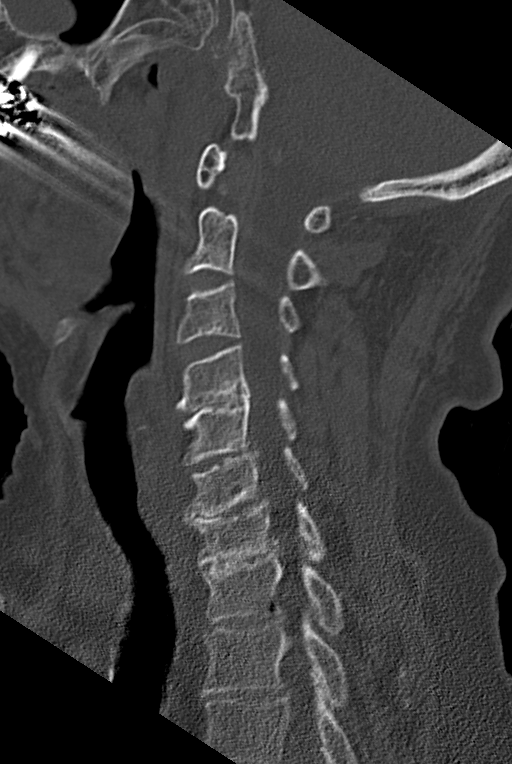
[im 40/60  bone]
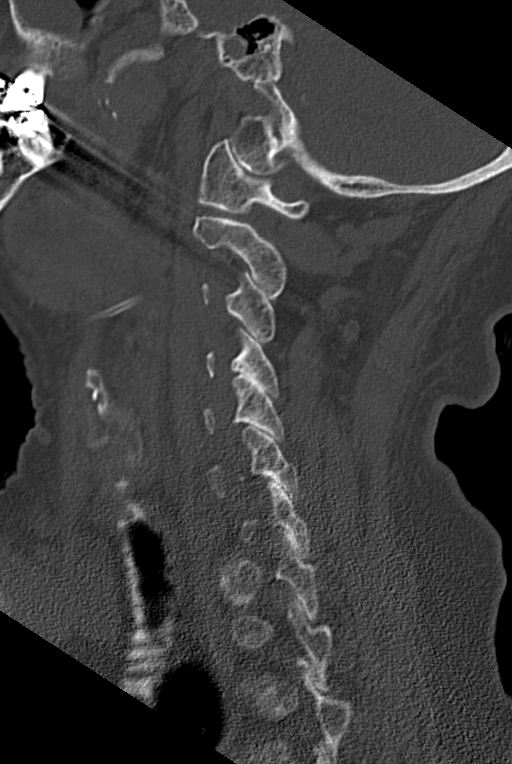

[Series 5: coronal bone · coronal · 0.23mm/px · 3 of 70 slices shown]
[im 22/70  bone]
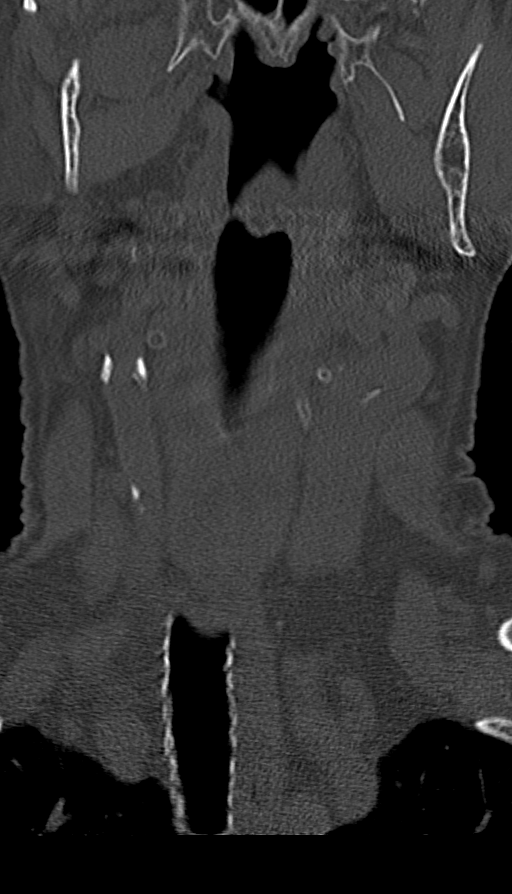
[im 31/70  bone]
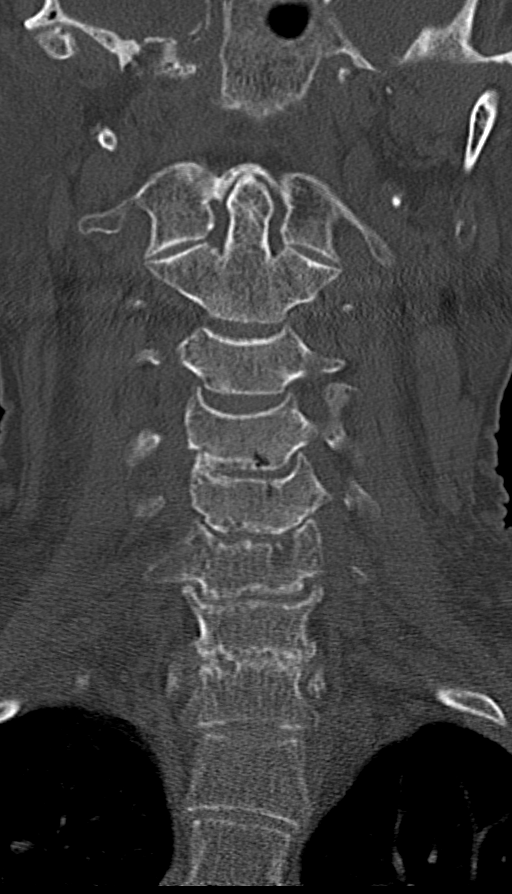
[im 40/70  bone]
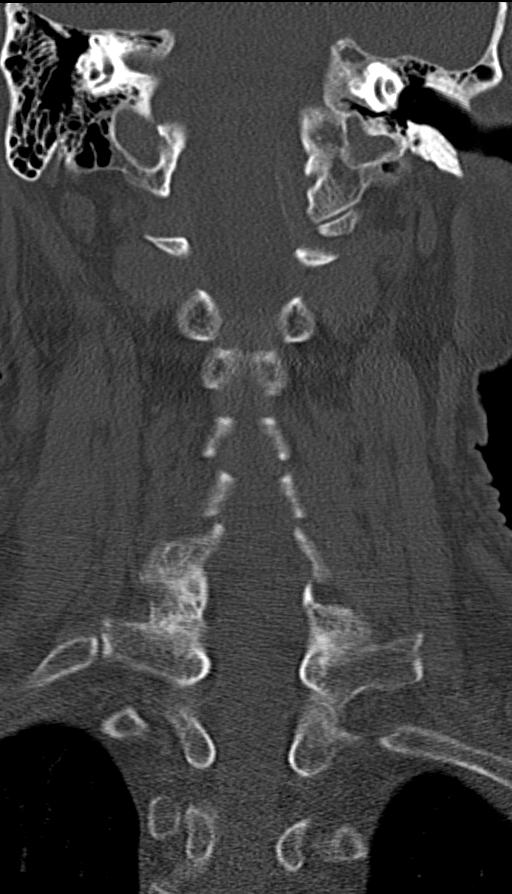

[Series 6: orthogonal bone · axial · 0.23mm/px · z∈[-272,-173]mm · 3 of 104 slices shown, 4 images]
[im 30/104  soft-tissue]
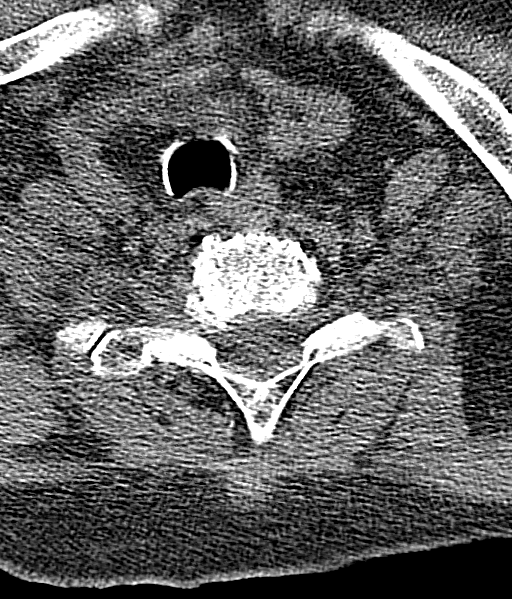
[im 30/104  bone]
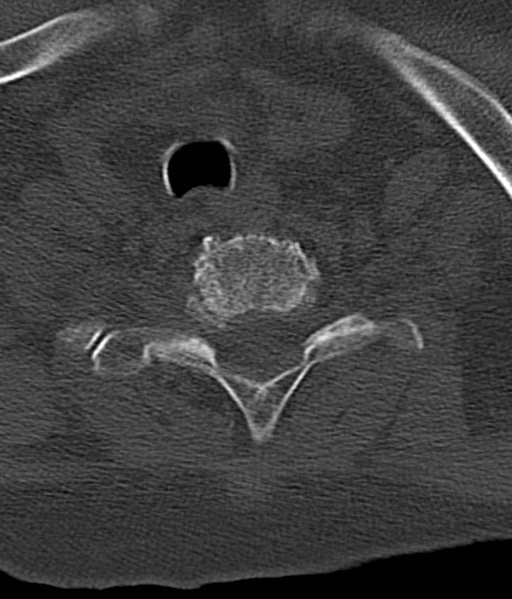
[im 59/104  bone]
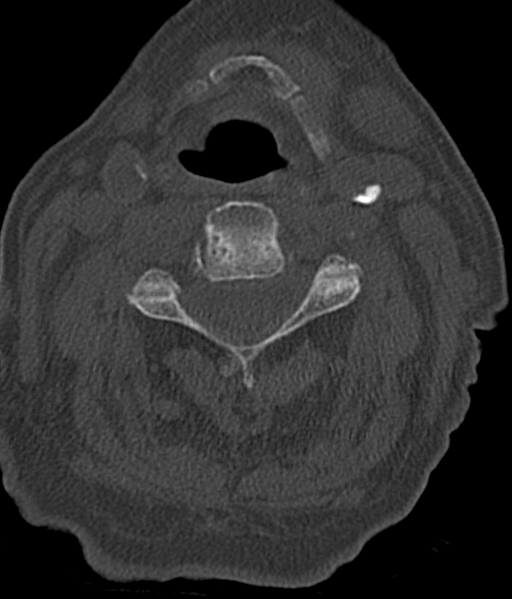
[im 89/104  bone]
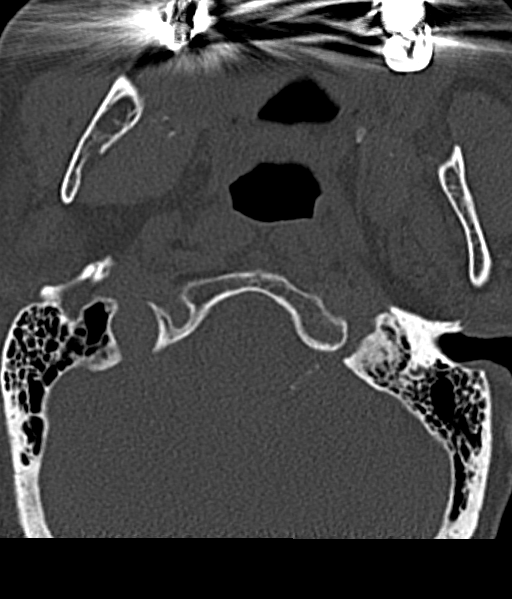

[11 of 33 positions shown; findings below may reference images not displayed]

FINDINGS: CT HEAD FINDINGS

Brain: Subacute appearing subdural collection overlying the left
cerebral convexity measuring up to 6-7 mm in thickness (series 3,
image 13). 4 mm left to right midline shift. No evidence of
intraparenchymal hemorrhage. No hydrocephalus. No mass lesion
identified. Mild-moderate low-density changes within the
periventricular and subcortical white matter compatible with chronic
microvascular ischemic change. Mild diffuse cerebral volume loss.

Vascular: Atherosclerotic calcifications involving the large vessels
of the skull base. No unexpected hyperdense vessel.

Skull: Normal. Negative for fracture or focal lesion.

Sinuses/Orbits: Mild bilateral ethmoid mucosal thickening.

Other: Negative for scalp hematoma.

CT CERVICAL SPINE FINDINGS

Alignment: Facet joints are aligned without dislocation or traumatic
listhesis. Dens and lateral masses are aligned.

Skull base and vertebrae: No acute fracture. No primary bone lesion
or focal pathologic process.

Soft tissues and spinal canal: No prevertebral fluid or swelling. No
visible canal hematoma.

Disc levels: Multilevel degenerative disc disease within the
cervical spine most pronounced from C4-5 through C7-T1. Mild
multilevel facet arthropathy. No evidence of high-grade canal
stenosis by CT.

Upper chest: Included lung apices are clear.

Other: None.
IMPRESSION: 1. Subacute appearing subdural hematoma overlying the left cerebral
convexity measuring up to 6-7 mm in thickness with associated 4 mm
left to right midline shift.
2. Chronic microvascular ischemic changes and cerebral volume loss.
3. No evidence of acute fracture or traumatic listhesis of the
cervical spine.
4. Multilevel degenerative disc disease and facet arthropathy of the
cervical spine.

Critical Value/emergent results were called by telephone at the time
of interpretation on 04/16/2021 at [DATE] to provider JIHYUN DESIRE ,
who verbally acknowledged these results.

## 2023-04-17 IMAGING — US US EXTREM LOW VENOUS
1 series · 14 of 24 positions shown · non-contrast
Comparison: None.

CLINICAL DATA: Swelling

EXAM:
BILATERAL LOWER EXTREMITY VENOUS DOPPLER ULTRASOUND
TECHNIQUE: Gray-scale sonography with compression, as well as color and duplex
ultrasound, were performed to evaluate the deep venous system(s)
from the level of the common femoral vein through the popliteal and
proximal calf veins.

[Series 1: us venous img lower bilat (dvt) · portal-venous · 14 of 70 slices shown]
[im 1/70]
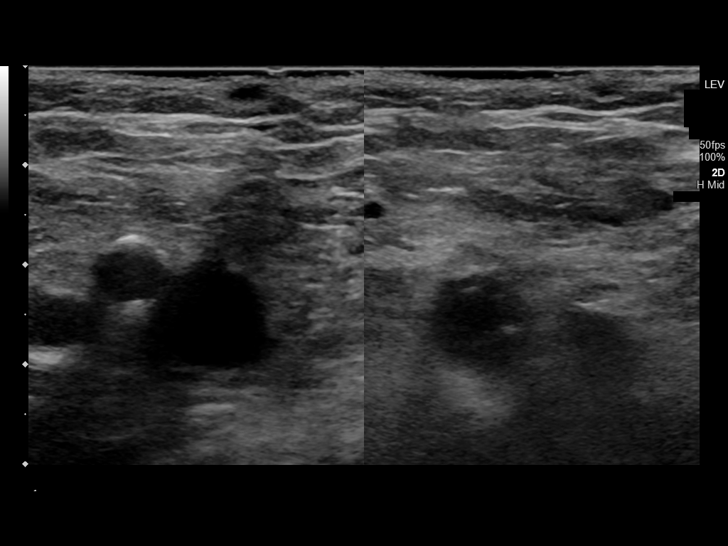
[im 7/70]
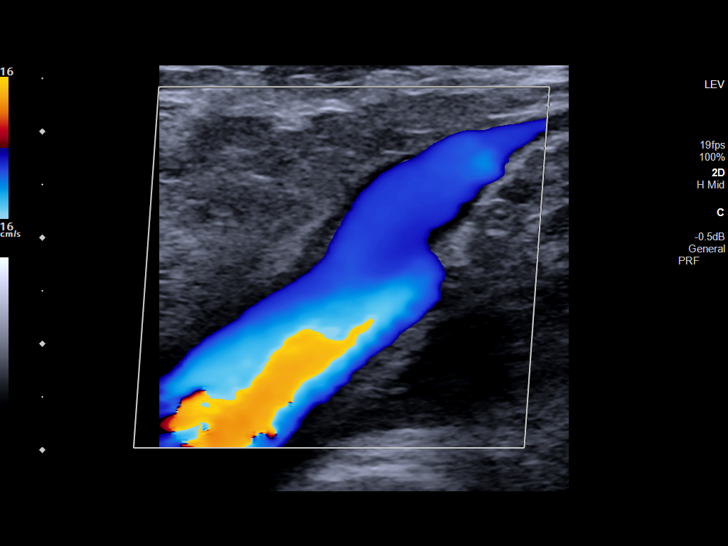
[im 13/70]
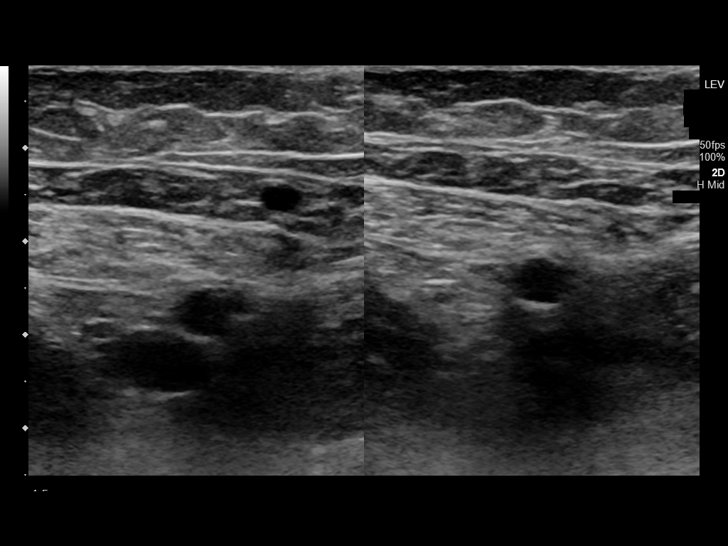
[im 19/70]
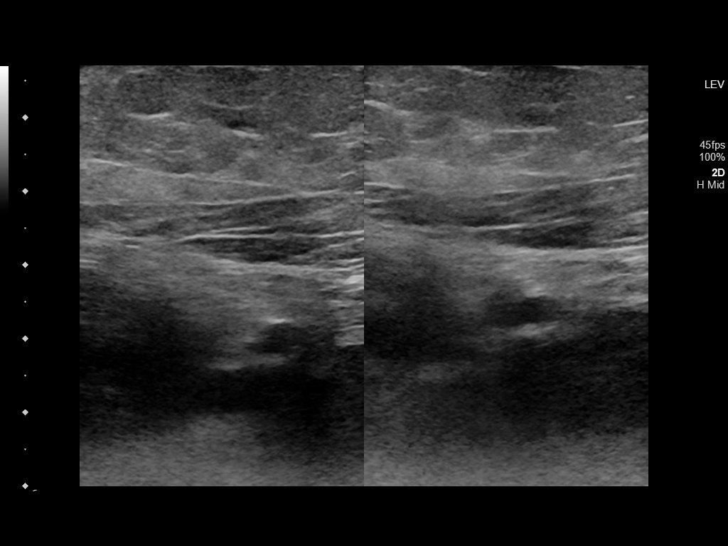
[im 22/70]
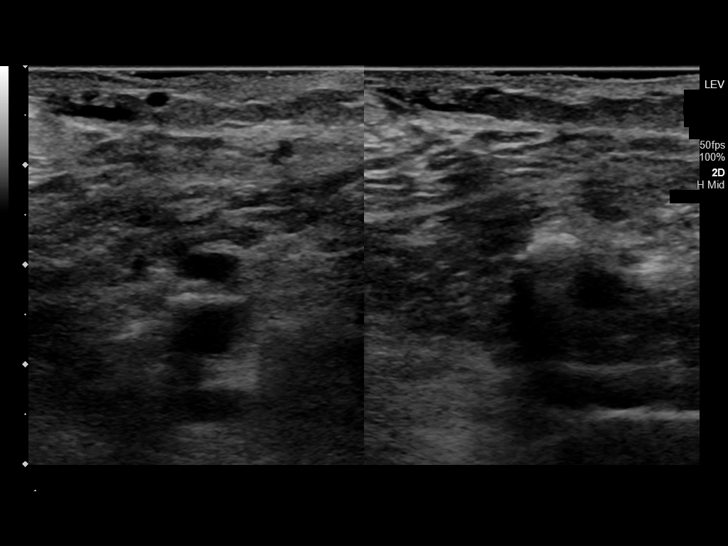
[im 28/70]
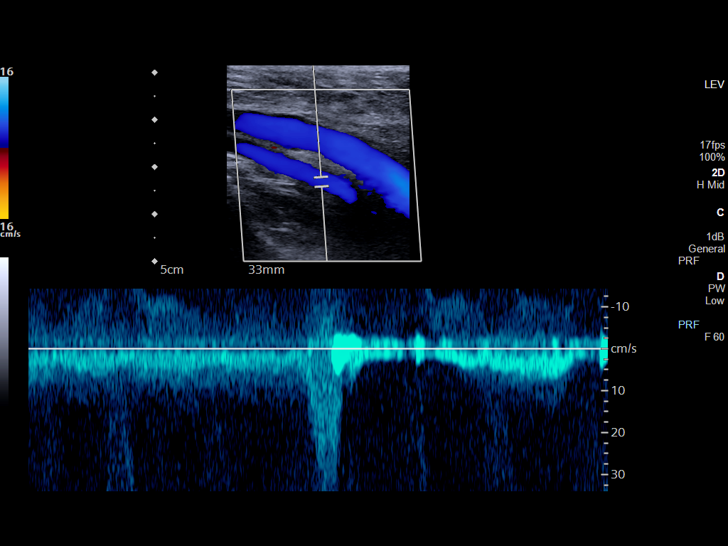
[im 34/70]
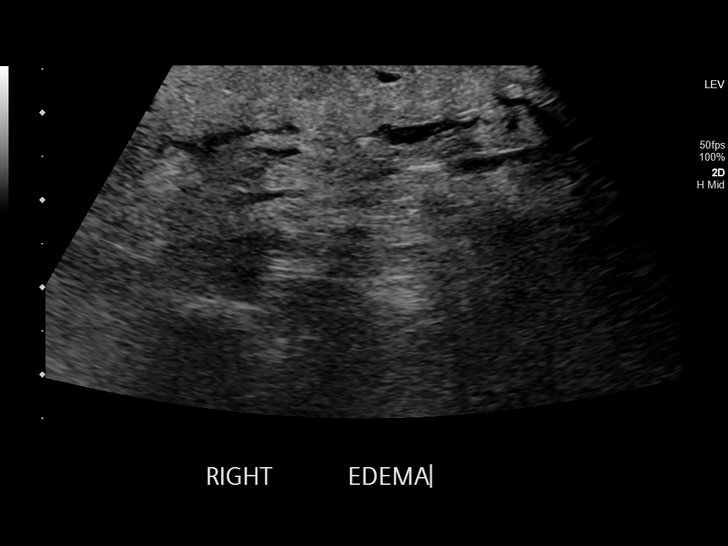
[im 37/70]
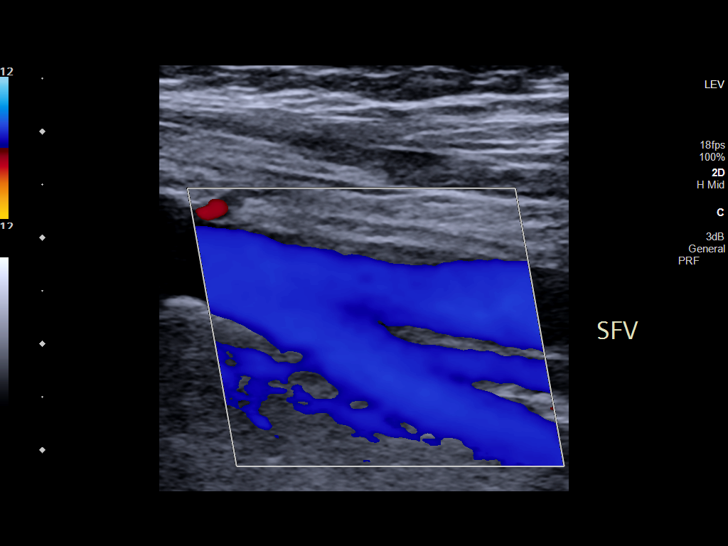
[im 43/70]
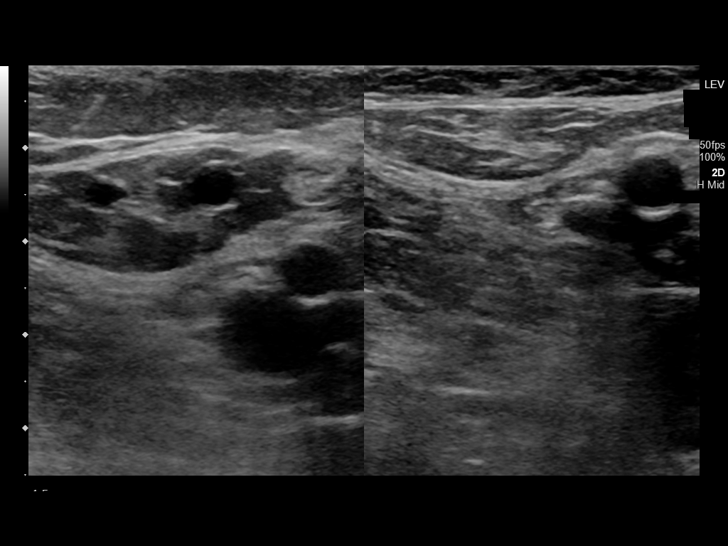
[im 49/70]
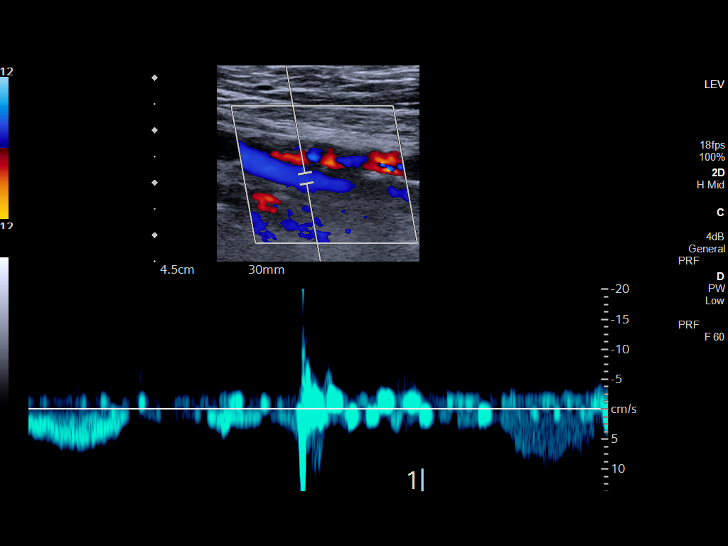
[im 55/70]
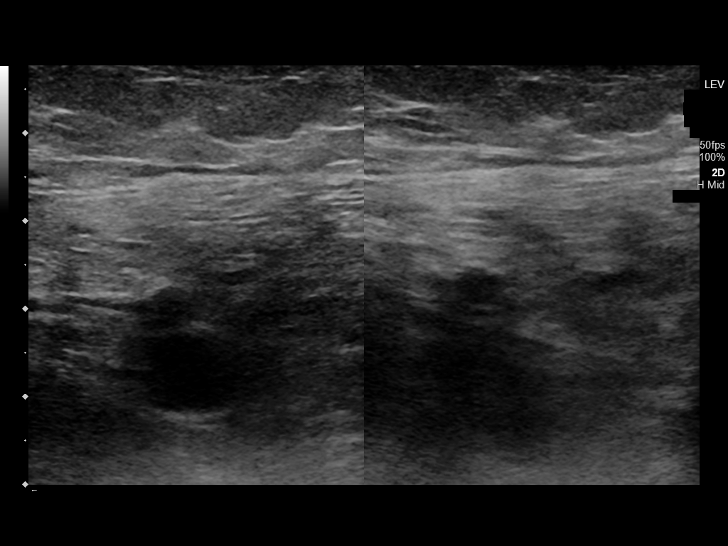
[im 58/70]
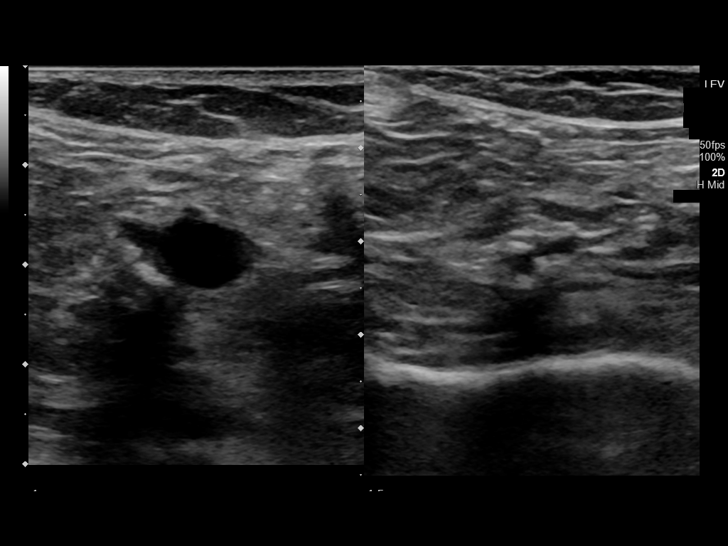
[im 64/70]
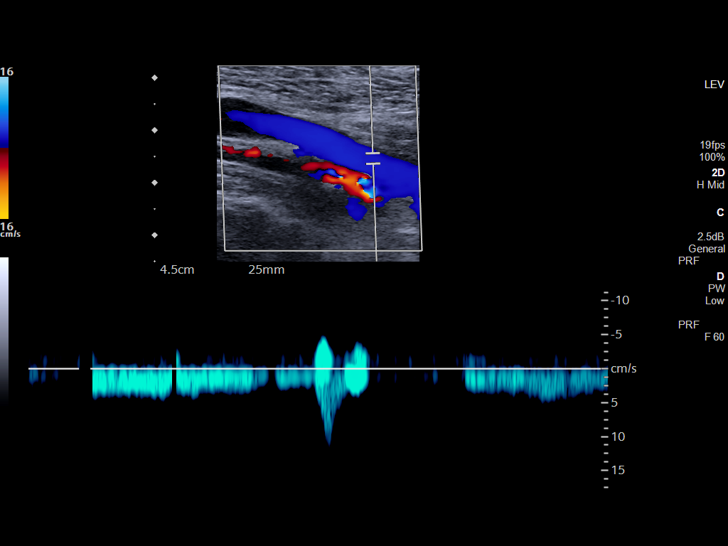
[im 70/70]
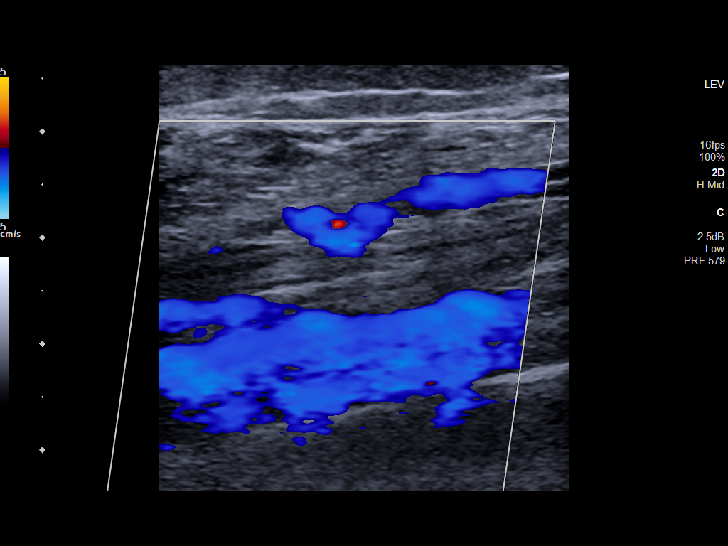

[14 of 24 positions shown; findings below may reference images not displayed]

FINDINGS: VENOUS

Normal compressibility of the common femoral, superficial femoral,
and popliteal veins, as well as the visualized calf veins.
Visualized portions of profunda femoral vein and great saphenous
vein unremarkable. No filling defects to suggest DVT on grayscale or
color Doppler imaging. Doppler waveforms show normal direction of
venous flow, normal respiratory plasticity and response to
augmentation.

Limited views of the contralateral common femoral vein are
unremarkable.

OTHER

None.

Limitations: none
IMPRESSION: Negative.

## 2023-04-17 IMAGING — CT CT HEAD W/O CM
4 series · 15 of 47 positions shown, 17 images · non-contrast
Comparison: None.

CLINICAL DATA: Unwitnessed fall 1 week ago.  Increasing confusion

EXAM:
CT HEAD WITHOUT CONTRAST
CT CERVICAL SPINE WITHOUT CONTRAST
TECHNIQUE: Multidetector CT imaging of the head and cervical spine was
performed following the standard protocol without intravenous
contrast. Multiplanar CT image reconstructions of the cervical spine
were also generated.

[Series 2: head bone · axial · 0.44mm/px · z∈[-112,-98]mm · 2 of 69 slices shown]
[im 7/69  bone]
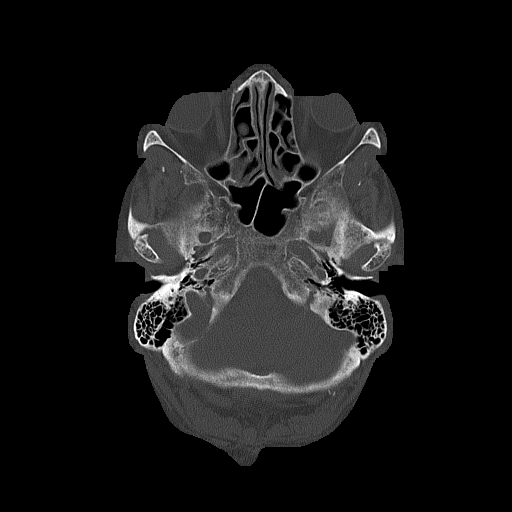
[im 14/69  bone]
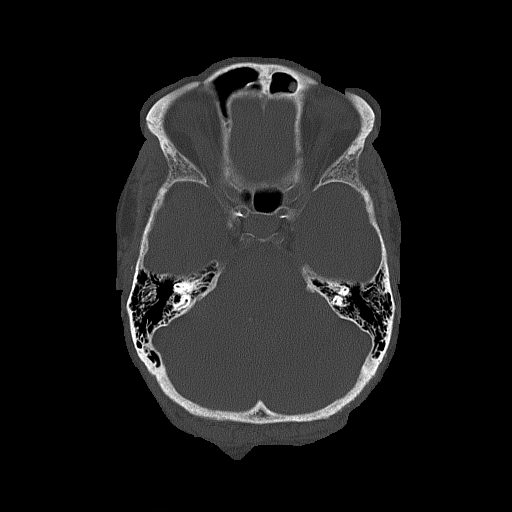

[Series 3: head wo · axial · 0.44mm/px · z∈[-109,-4]mm · 7 of 29 slices shown, 9 images]
[im 4/29  brain]
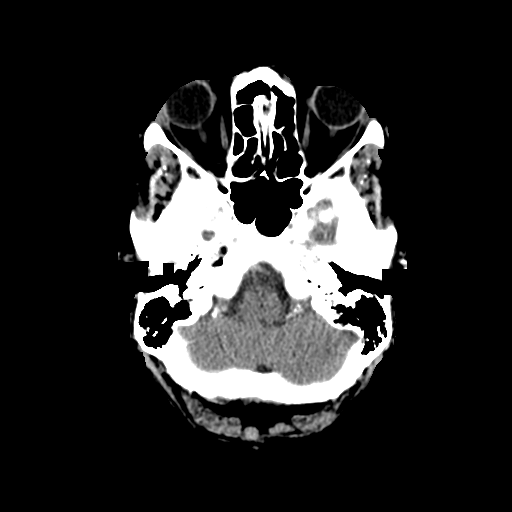
[im 4/29  bone]
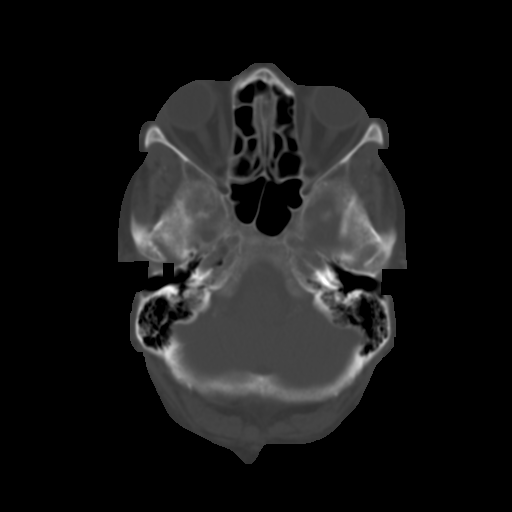
[im 8/29  brain]
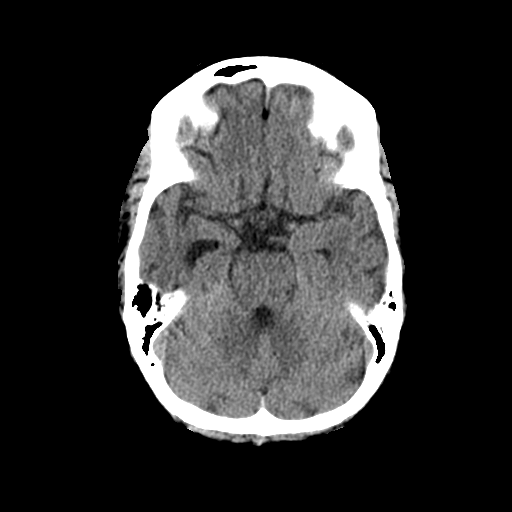
[im 11/29  brain]
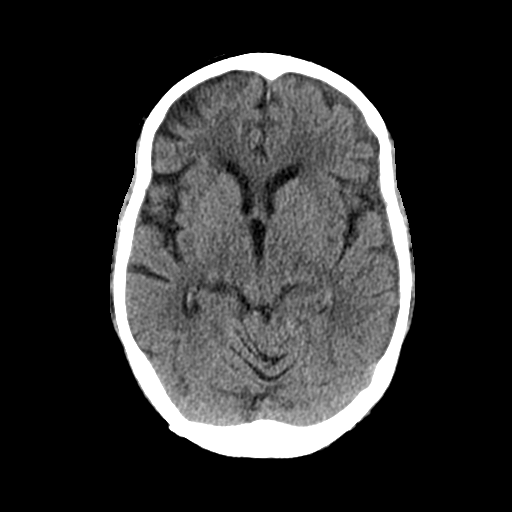
[im 15/29  brain]
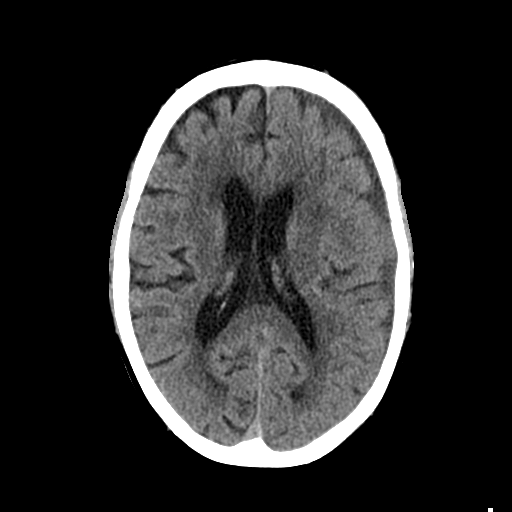
[im 18/29  brain]
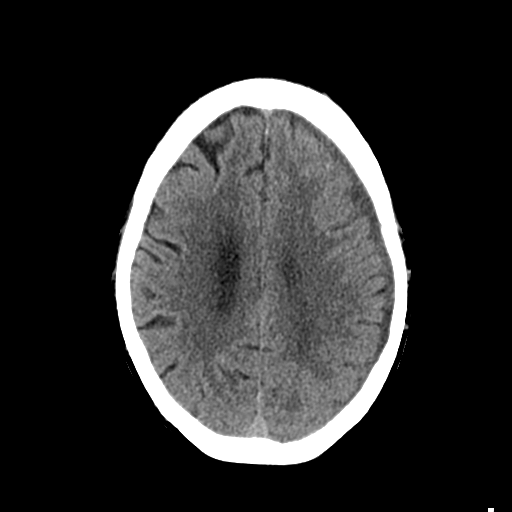
[im 18/29  bone]
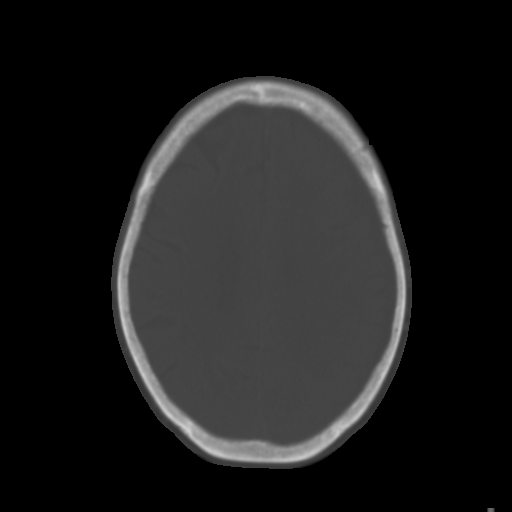
[im 22/29  brain]
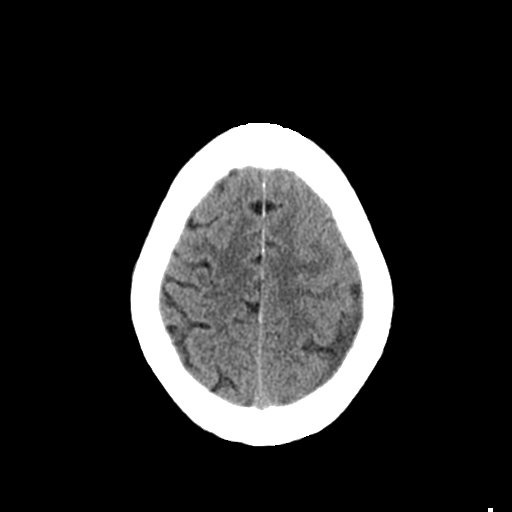
[im 25/29  brain]
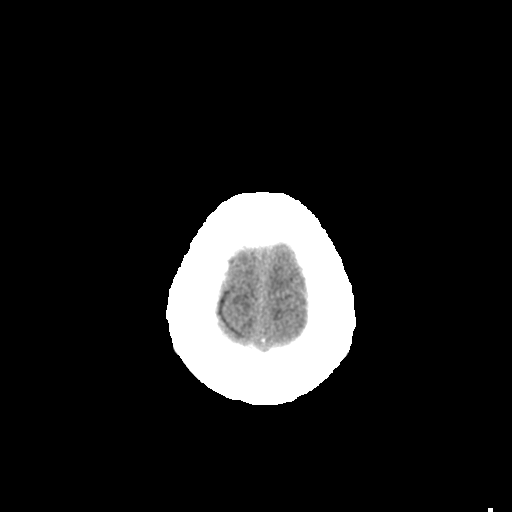

[Series 4: coronal soft tissue · coronal · 0.28mm/px · 3 of 64 slices shown]
[im 22/64  brain]
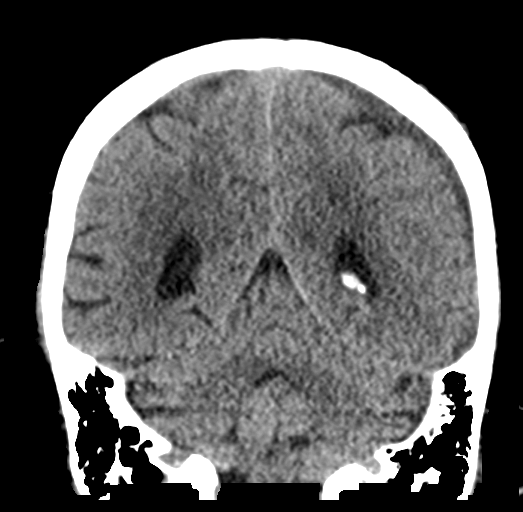
[im 29/64  brain]
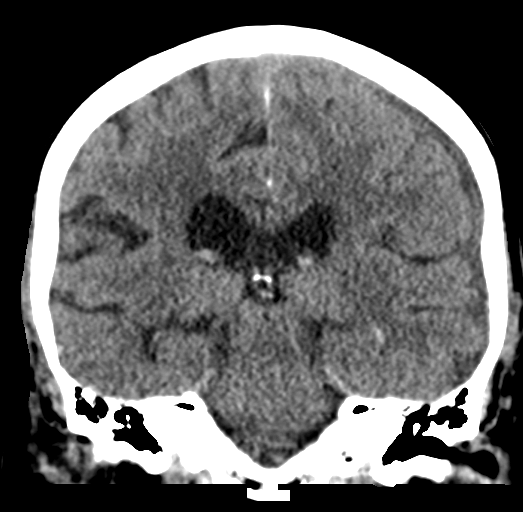
[im 36/64  brain]
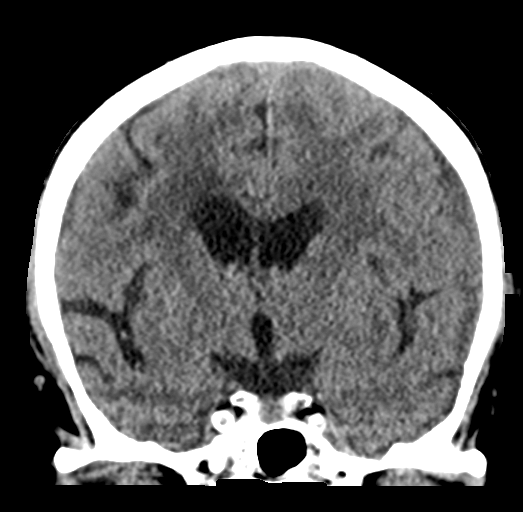

[Series 5: sagittal soft tissue · sagittal · 0.28mm/px · 3 of 49 slices shown]
[im 17/49  brain]
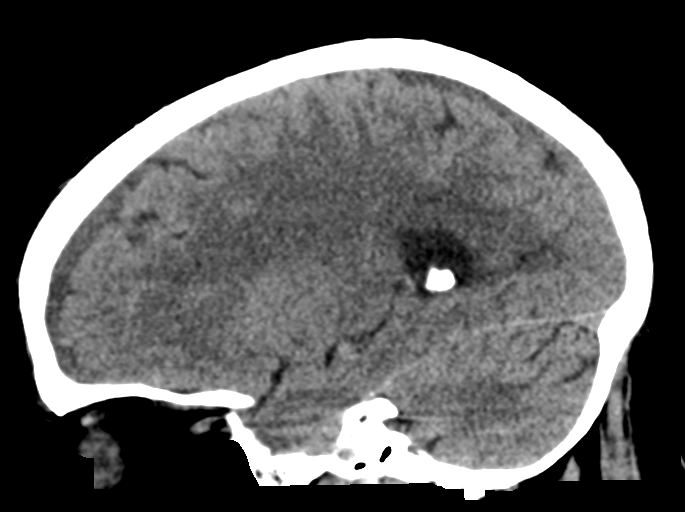
[im 25/49  brain]
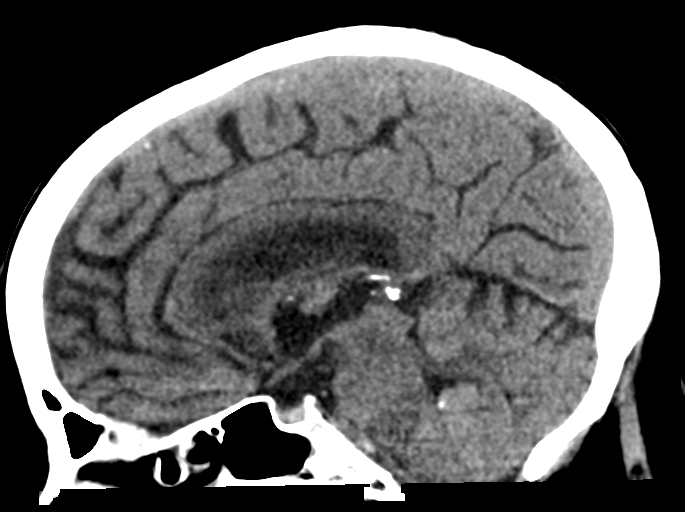
[im 33/49  brain]
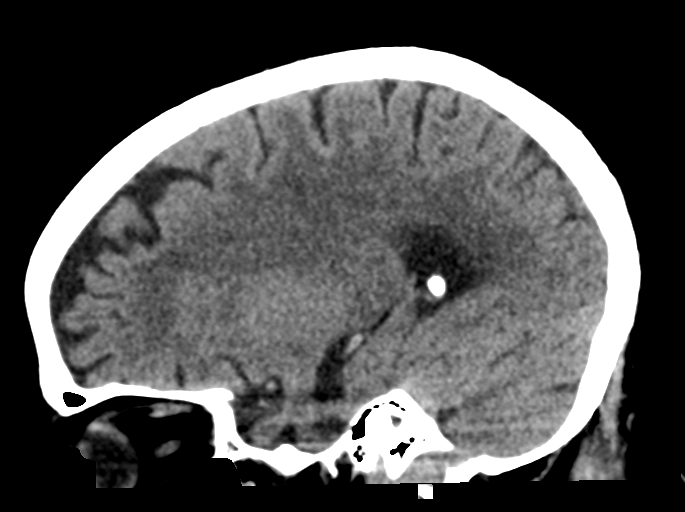

[15 of 47 positions shown; findings below may reference images not displayed]

FINDINGS: CT HEAD FINDINGS

Brain: Subacute appearing subdural collection overlying the left
cerebral convexity measuring up to 6-7 mm in thickness (series 3,
image 13). 4 mm left to right midline shift. No evidence of
intraparenchymal hemorrhage. No hydrocephalus. No mass lesion
identified. Mild-moderate low-density changes within the
periventricular and subcortical white matter compatible with chronic
microvascular ischemic change. Mild diffuse cerebral volume loss.

Vascular: Atherosclerotic calcifications involving the large vessels
of the skull base. No unexpected hyperdense vessel.

Skull: Normal. Negative for fracture or focal lesion.

Sinuses/Orbits: Mild bilateral ethmoid mucosal thickening.

Other: Negative for scalp hematoma.

CT CERVICAL SPINE FINDINGS

Alignment: Facet joints are aligned without dislocation or traumatic
listhesis. Dens and lateral masses are aligned.

Skull base and vertebrae: No acute fracture. No primary bone lesion
or focal pathologic process.

Soft tissues and spinal canal: No prevertebral fluid or swelling. No
visible canal hematoma.

Disc levels: Multilevel degenerative disc disease within the
cervical spine most pronounced from C4-5 through C7-T1. Mild
multilevel facet arthropathy. No evidence of high-grade canal
stenosis by CT.

Upper chest: Included lung apices are clear.

Other: None.
IMPRESSION: 1. Subacute appearing subdural hematoma overlying the left cerebral
convexity measuring up to 6-7 mm in thickness with associated 4 mm
left to right midline shift.
2. Chronic microvascular ischemic changes and cerebral volume loss.
3. No evidence of acute fracture or traumatic listhesis of the
cervical spine.
4. Multilevel degenerative disc disease and facet arthropathy of the
cervical spine.

Critical Value/emergent results were called by telephone at the time
of interpretation on 04/16/2021 at [DATE] to provider JIHYUN DESIRE ,
who verbally acknowledged these results.

## 2023-04-18 ENCOUNTER — Other Ambulatory Visit (INDEPENDENT_AMBULATORY_CARE_PROVIDER_SITE_OTHER): Payer: Self-pay | Admitting: Vascular Surgery

## 2023-04-20 NOTE — Unmapped (Signed)
[facility] Refill Coordination Note    Specialty Medication(s) to be Shipped:   Transplant: sirolimus 0.5mg     Other medication(s) to be shipped: No additional medications requested for fill at this time     Heidi Blankenship, DOB: 12-19-1946  Phone: There are no phone numbers on file.      All above HIPAA information was verified with patient.     Was a Nurse, learning disability used for this call? No    Completed refill call assessment today to schedule patient's medication shipment from the [facility] [phone]).  All relevant notes have been reviewed.     Specialty medication(s) and dose(s) confirmed: Regimen is correct and unchanged.   Changes to medications: Fiyinfoluwa reports no changes at this time.  Changes to insurance: No  New side effects reported not previously addressed with a pharmacist or physician: None reported  Questions for the pharmacist: No    Confirmed patient received a Conservation officer, historic buildings and a Surveyor, mining with first shipment. The patient will receive a drug information handout for each medication shipped and additional FDA Medication Guides as required.       DISEASE/MEDICATION-SPECIFIC INFORMATION        N/A    SPECIALTY MEDICATION ADHERENCE     Medication Adherence    Patient reported X missed doses in the last month: 0  Specialty Medication: sirolimus 0.5 mg tablet (RAPAMUNE)  Patient is on additional specialty medications: No  Patient is on more than two specialty medications: No              Were doses missed due to medication being on hold? No    Sirolimus 0.5 mg: 11 days of medicine on hand     REFERRAL TO PHARMACIST     Referral to the pharmacist: Not needed      [facility]     Shipping address confirmed in Epic.     Patient was notified of new phone menu : Yes    Delivery Scheduled: Yes, Expected medication delivery date: 04/27/23.     Medication will be delivered via Next Day Courier to the prescription address in Epic WAM.    Gaspar Cola Shared [facility] Specialty Technician

## 2023-04-26 ENCOUNTER — Other Ambulatory Visit
Admission: RE | Admit: 2023-04-26 | Discharge: 2023-04-26 | Disposition: A | Discharge status: Home / Self Care | Payer: Medicare Other | Attending: Nephrology | Admitting: Nephrology

## 2023-04-26 DIAGNOSIS — Z114 Encounter for screening for human immunodeficiency virus [HIV]: Secondary | ICD-10-CM | POA: Insufficient documentation

## 2023-04-26 DIAGNOSIS — Z79899 Other long term (current) drug therapy: Secondary | ICD-10-CM | POA: Insufficient documentation

## 2023-04-26 DIAGNOSIS — D899 Disorder involving the immune mechanism, unspecified: Secondary | ICD-10-CM | POA: Diagnosis present

## 2023-04-26 DIAGNOSIS — B259 Cytomegaloviral disease, unspecified: Secondary | ICD-10-CM | POA: Insufficient documentation

## 2023-04-26 DIAGNOSIS — Z9483 Pancreas transplant status: Secondary | ICD-10-CM | POA: Insufficient documentation

## 2023-04-26 DIAGNOSIS — X58XXXS Exposure to other specified factors, sequela: Secondary | ICD-10-CM | POA: Insufficient documentation

## 2023-04-26 DIAGNOSIS — T8610 Unspecified complication of kidney transplant: Secondary | ICD-10-CM | POA: Diagnosis not present

## 2023-04-26 DIAGNOSIS — Z940 Kidney transplant status: Secondary | ICD-10-CM | POA: Insufficient documentation

## 2023-04-26 DIAGNOSIS — E1129 Type 2 diabetes mellitus with other diabetic kidney complication: Secondary | ICD-10-CM | POA: Insufficient documentation

## 2023-04-26 DIAGNOSIS — D631 Anemia in chronic kidney disease: Secondary | ICD-10-CM | POA: Insufficient documentation

## 2023-04-26 DIAGNOSIS — Z09 Encounter for follow-up examination after completed treatment for conditions other than malignant neoplasm: Secondary | ICD-10-CM | POA: Insufficient documentation

## 2023-04-26 DIAGNOSIS — E559 Vitamin D deficiency, unspecified: Secondary | ICD-10-CM | POA: Diagnosis not present

## 2023-04-26 DIAGNOSIS — N390 Urinary tract infection, site not specified: Secondary | ICD-10-CM | POA: Diagnosis not present

## 2023-04-26 DIAGNOSIS — Z789 Other specified health status: Secondary | ICD-10-CM | POA: Insufficient documentation

## 2023-04-26 LAB — CBC WITH DIFFERENTIAL/PLATELET
Abs Immature Granulocytes: 0.07 10*3/uL (ref 0.00–0.07)
Basophils Absolute: 0 10*3/uL (ref 0.0–0.1)
Basophils Relative: 1 %
Eosinophils Absolute: 0.1 10*3/uL (ref 0.0–0.5)
Eosinophils Relative: 2 %
HCT: 40.1 % (ref 36.0–46.0)
Hemoglobin: 13.4 g/dL (ref 12.0–15.0)
Immature Granulocytes: 1 %
Lymphocytes Relative: 34 %
Lymphs Abs: 2.8 10*3/uL (ref 0.7–4.0)
MCH: 29.6 pg (ref 26.0–34.0)
MCHC: 33.4 g/dL (ref 30.0–36.0)
MCV: 88.5 fL (ref 80.0–100.0)
Monocytes Absolute: 1.1 10*3/uL — ABNORMAL HIGH (ref 0.1–1.0)
Monocytes Relative: 14 %
Neutro Abs: 4.1 10*3/uL (ref 1.7–7.7)
Neutrophils Relative %: 48 %
Platelets: 578 10*3/uL — ABNORMAL HIGH (ref 150–400)
RBC: 4.53 MIL/uL (ref 3.87–5.11)
RDW: 15.9 % — ABNORMAL HIGH (ref 11.5–15.5)
WBC: 8.3 10*3/uL (ref 4.0–10.5)
nRBC: 0 % (ref 0.0–0.2)

## 2023-04-26 LAB — BASIC METABOLIC PANEL
Anion gap: 3 — ABNORMAL LOW (ref 5–15)
BUN: 31 mg/dL — ABNORMAL HIGH (ref 8–23)
CO2: 28 mmol/L (ref 22–32)
Calcium: 8.6 mg/dL — ABNORMAL LOW (ref 8.9–10.3)
Chloride: 101 mmol/L (ref 98–111)
Creatinine, Ser: 0.94 mg/dL (ref 0.44–1.00)
GFR, Estimated: >60 mL/min (ref >60)
Glucose, Bld: 102 mg/dL — ABNORMAL HIGH (ref 70–99)
Potassium: 4 mmol/L (ref 3.5–5.1)
Sodium: 132 mmol/L — ABNORMAL LOW (ref 135–145)

## 2023-04-26 LAB — PHOSPHORUS: Phosphorus: 3.8 mg/dL (ref 2.5–4.6)

## 2023-04-26 LAB — MAGNESIUM: Magnesium: 1.9 mg/dL (ref 1.7–2.4)

## 2023-04-26 MED FILL — SIROLIMUS 0.5 MG TABLET: ORAL | 30 days supply | Qty: 60 | Fill #3

## 2023-04-27 LAB — CBC W/ DIFFERENTIAL
BASOPHILS ABSOLUTE COUNT: 0 10*3/uL (ref 0.0–0.1)
BASOPHILS RELATIVE PERCENT: 1 %
EOSINOPHILS ABSOLUTE COUNT: 0.1 10*3/uL (ref 0.0–0.5)
EOSINOPHILS RELATIVE PERCENT: 2 %
HEMATOCRIT: 40.1 (ref 36.0–46.0)
HEMOGLOBIN: 13.4 g/dL (ref 12.0–15.0)
IMMATURE CELLS: 1 %
LYMPHOCYTES ABSOLUTE COUNT: 2.8 10*3/uL (ref 0.7–4.0)
LYMPHOCYTES RELATIVE PERCENT: 34 %
MEAN CORPUSCULAR HEMOGLOBIN CONC: 33.4 g/dL (ref 30.0–36.0)
MEAN CORPUSCULAR HEMOGLOBIN: 29.6 pg (ref 26.0–34.0)
MEAN CORPUSCULAR VOLUME: 88.5 fL (ref 80.0–100.0)
MONOCYTES ABSOLUTE COUNT: 1.1 10*3/uL — ABNORMAL HIGH (ref 0.1–1.0)
MONOCYTES RELATIVE PERCENT: 14 %
NEUTROPHILS RELATIVE PERCENT: 48 %
NUCLEATED RED BLOOD CELLS: 0 % (ref 0.0–0.2)
PLATELET COUNT: 578 10*3/uL — ABNORMAL HIGH (ref 150–400)
RED BLOOD CELL COUNT: 4.53 MIL/uL (ref 3.87–5.11)
RED CELL DISTRIBUTION WIDTH: 15.9 % — ABNORMAL HIGH (ref 11.5–15.5)
WHITE BLOOD CELL COUNT: 8.3 10*3/uL (ref 4.0–10.5)

## 2023-04-27 LAB — BASIC METABOLIC PANEL
ANION GAP: 3 — ABNORMAL LOW (ref 5–15)
BLOOD UREA NITROGEN: 31 mg/dL — ABNORMAL HIGH (ref 8–23)
CALCIUM: 8.6 mg/dL — ABNORMAL LOW (ref 8.9–10.3)
CHLORIDE: 101 mmol/L (ref 98–111)
CO2: 28 mmol/L (ref 22–32)
CREATININE: 0.94 mg/dL (ref 0.44–1.00)
EGFR CKD-EPI NON-AA FEMALE: >60 mL/min
GLUCOSE RANDOM: 102 mg/dL — ABNORMAL HIGH (ref 70–99)
MAGNESIUM: 1.9 mg/dL (ref 1.7–2.4)
PHOSPHORUS: 3.8 mg/dL (ref 2.5–4.6)
POTASSIUM: 4 mmol/L (ref 3.5–5.1)
SODIUM: 132 mmol/L — ABNORMAL LOW (ref 135–145)

## 2023-04-30 LAB — EVEROLIMUS: EVEROLIMUS LEVEL: <2 ng/mL — ABNORMAL LOW (ref 3.0–15.0)

## 2023-05-07 DIAGNOSIS — Z940 Kidney transplant status: Principal | ICD-10-CM

## 2023-05-07 DIAGNOSIS — Z79899 Other long term (current) drug therapy: Principal | ICD-10-CM

## 2023-05-12 NOTE — Unmapped (Signed)
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 presents today for follow-up. I last saw her on 05/04/22.   She saw Dr Heidi Blankenship on 12/21/22.    She reports that she had a few falls in October and November. It was recommended to have a bedtime snack to prevent hypoglycemia. She hasn't fallen since that time. She is not experiencing as many low glucose readings as well.     She has a runny nose and is wondering if related to her falls and possible sinus injury.    She has pain in her left hip. She is taking a collagen supplement with relief (taking every morning). It has helped remarkably!    She has nocturia (1-4x/night)    Her weight is up from the last visit (10 pounds).    She has right sided swelling. She plans to try massage. She stopped taking the torsemide as it wasn't helping her.    She is checking her BP at home. She did not bring in her readings today. She reports that her systolic is ranging 130-140's/70's. Overall the readings are higher.    She also has a cat named BK      ROS:   CONSTITUTIONAL: denies fevers or chills, denies unintentional weight loss  CARDIOVASCULAR: denies chest pain, denies dyspnea on exertion, denies leg edema  GASTROINTESTINAL: denies nausea, denies vomiting, denies anorexia  GENITOURINARY: denies dysuria, denies hematuria, denies decreased urinary stream  All systems reviewed and are negative except as listed above.    PAST MEDICAL HISTORY:  Past Medical History:   Diagnosis Date    Allergic 1953. Penicillin    Very bad rash    Arthritis Oct. 2020    Pain & swelling. 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      aspirin (ECOTRIN) 81 MG tablet Take 1 tablet (81 mg total) by mouth daily.      atorvastatin (LIPITOR) 20 MG tablet Take 1 tablet (20 mg total) by mouth daily. 30 tablet 11    cholecalciferol, vitamin D3, (VITAMIN D3) 2,000 unit Tab Take 1 tablet by mouth daily. 90 each 5    clopidogreL (PLAVIX) 75 mg tablet Take 1 tablet (75 mg total) by mouth daily.      levothyroxine (SYNTHROID) 100 MCG tablet TAKE 1 TABLET BY MOUTH EVERY DAY (Patient taking differently: Take 1 tablet (100 mcg total) by mouth. Taking 100 mcg daily x 6 days/week.) 90 tablet 11    lisinopriL (PRINIVIL,ZESTRIL) 10 MG tablet TAKE 1 TABLET BY MOUTH EVERY DAY 90 tablet 4    NOVOLIN N NPH U-100 INSULIN 100 unit/mL injection INJECT 30 UNITS EVERY MORNING AND 6 UNITS EVERY EVENING  NOVOLIN R REGULAR U100 INSULIN 100 unit/mL injection INJECT 10 UNITS WITH BREAKFAST, 5 UNITS WITH LUNCH AND 5 UNITS WITH DINNER      omeprazole (PRILOSEC) 40 MG capsule Take 1 capsule (40 mg total) by mouth every other day.      predniSONE (DELTASONE) 5 MG tablet Take 1 tablet (5 mg total) by mouth daily. 30 tablet 11    sirolimus (RAPAMUNE) 0.5 mg tablet Take 2 tablets (1 mg total) by mouth in the morning. 180 tablet 3    tacrolimus (PROGRAF) 0.5 MG capsule TAKE 3 CAPSULES (1.5 MG) IN THE MORNING AND 2 CAPSULES (1 MG) IN THE EVENING 150 capsule 6    torsemide (DEMADEX) 20 MG tablet Take 1 tablet (20 mg total) by mouth daily as needed.       No current facility-administered medications for this visit.       PHYSICAL EXAM:  Vitals:    05/14/23 1250   BP: 142/70   Pulse: 87   Temp: 36.3 ??C (97.3 ??F)     Wt Readings from Last 6 Encounters:   05/14/23 75.3 kg (166 lb)   12/21/22 71.1 kg (156 lb 12.8 oz)   05/04/22 69.8 kg (153 lb 12.8 oz)   02/27/22 69.9 kg (154 lb)   12/09/21 70.3 kg (155 lb)   05/31/21 69.3 kg (152 lb 11.2 oz)        CONSTITUTIONAL: Alert,well appearing, no distress  HEENT: [place]/AT  NECK: Supple  CARDIOVASCULAR: RRR   PULM: Clear to auscultation bilaterally  GASTROINTESTINAL: Soft, active bowel sounds, nontender, RLQ txp allograft non-tender  EXTREMITIES: + pitting edema  Left leg no edema, RUE edema  NEUROLOGIC: No focal motor or sensory deficits      MEDICAL DECISION MAKING    Results for orders placed or performed in visit on 04/26/23   CBC w/ Differential   Result Value Ref Range    WBC 8.3 4.0 - 10.5 K/uL    RBC 4.53 3.87 - 5.11 MIL/uL    HGB 13.4 12.0 - 15.0 g/dL    HCT 16.1 09.6 - 04.5    MCV 88.5 80.0 - 100.0 fL    MCH 29.6 26.0 - 34.0 pg    MCHC 33.4 30.0 - 36.0 g/dL    RDW 40.9 (H) 81.1 - 15.5 %    Platelet 578 (H) 150 - 400 K/uL    nRBC 0.0 0.0 - 0.2 %    Neutrophils % 48 %    Lymphocytes % 34 %    Absolute Lymphocytes 2.8 0.7 - 4.0 K/uL    Monocytes % 14 %    Absolute Monocytes 1.1 (H) 0.1 - 1.0 K/uL    Eosinophils % 2 %    Absolute Eosinophils 0.1 0.0 - 0.5 K/uL    Basophils % 1 %    Absolute Basophils 0.0 0.0 - 0.1 K/uL    Immature Cells 1 %     *Note: Due to a large number of results and/or encounters for the requested time period, some results have not been displayed. A complete set of results can be found in Results Review.        No results for input(s): NA, K, CL, BUN, CREATININE, GFR, GLU in the last 24 hours.    Invalid input(s): C02     Lab Results   Component Value Date    PTH 88.0 (H) 12/21/2022    CALCIUM 8.6 (L) 04/26/2023        Urine pr:cr  Neg  05/03/16    02/06/17 Cr 0.8, Hgb 14.4    07/06/17  Cr 0.8, Hgb 14.5, Phos 3.4, Mg 1.8, K 4.0    01/07/18 Hgb 14.1, Cr 0.8, K 3.8, Ca 9.0, Mg 1.8, Phos 3.5, Tac 3.4, Everolimus 6    04/08/17 Cr 0.8, K 4.2, Ca 9.3, Hgb 13.8    04/26/20 Cr 0.8, K 4.3, C02 27, Phos 3.6, WBC 10.9    05/25/21  Hgb 13.7, K 4.1, C02 26, Cr 0.7, Phos 3.2, Ca 9.0, tac 2.6    12/09/21 urine pr:cr 0.098    Renal US Txp- no stone    02/27/22 HgbAIC 6.4    03/29/22 Na 133, K 4.7, C02 25, Cr 0.9, BUN 27, Phos 3.3, Ca 9.1, Mg 2.1, Hgb 13.6    04/25/22 Tac 3.1, Na 133, K 4.1, C02 27, Cr 1.0, BUN 34, Hgb 13.5    11/24/22 Renal Txp [place]- no stone    12/21/22 PTH 88, 1 25 vit 34, 25-vit D 33, HgbAIC 5.4    02/16/23 urine alb:cr 5.7, urine pr:cr 0.073    04/26/23 Na 132, K 4.0, C02 28, Cr 0.94, Phos 3.8, Ca 8.6, Hgb 13.4      ASSESSMENT/PLAN:      HeidiMagin C Barbre is a 77 y.o. 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     1. 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 Baseline Cr < 1.0. Currently on sirolimus 1 mg once daily, tacrolimus 1.5/1 mg bid and prednisone 5 mg qd. She had thymo induction.    2. DM1-- Under the care of Endocrine and her PCP    3. HTN-- Goal < 130/80. Currently on 10 mg of lisinopril qd. She is above goal in the office today and based on self-report of home readings. We discussed today. She was taking torsemide which may have helped her BP in the past. She didn't want to add it back in but we did discuss getting in touch with someone if her SBP is consistently above 135.    24-hr ABPM performed in July 2018.    4. CV Risk Reduction-- ASA 81 mg qd, on statin, DM and BP control.    5. Post-transplant erythyrocytosis--- Asymptomatic. Of note, she saw Dr Sherrlyn Hock (Hematologist) in the past for her thrombocytopenia and erythrocytosis when on dialysis. Her Hgb/Hct is improved with lisinopril. Hgb goal < 17.5. Continue Lisinopril    6. Hypothyroidism--- Following with PCP. Taking synthroid 6 times per week    7. Right leg edema-  Follows with [facility]. Recommend elevation. She stopped taking torsemide 20 mg every day.    8. Kidney stone- Had CT Scan 4/23 which noted 3 mm nonobstructive stone in the upper pole of the txp kidney. Renal ultrasound in Dec 2022 did not note a stone as well as renal txp ultrasound from Dec 2023.      I personally spent 23 minutes face-to-face and non-face-to-face in the care of this patient, which includes all pre, intra, and post visit time on the date of service.      HeidiAubreyanna C Blankenship will follow up in May 2025. She will see Dr Heidi Blankenship in Dec 2024. No labs needed before that visit.

## 2023-05-14 ENCOUNTER — Ambulatory Visit: Admit: 2023-05-14 | Discharge: 2023-05-15 | Payer: MEDICARE

## 2023-05-14 DIAGNOSIS — Z79899 Other long term (current) drug therapy: Principal | ICD-10-CM

## 2023-05-14 DIAGNOSIS — D751 Secondary polycythemia: Principal | ICD-10-CM

## 2023-05-14 DIAGNOSIS — Z940 Kidney transplant status: Principal | ICD-10-CM

## 2023-05-14 DIAGNOSIS — I10 Essential (primary) hypertension: Principal | ICD-10-CM

## 2023-05-16 NOTE — Unmapped (Signed)
[facility] Refill Coordination Note    Specialty Medication(s) to be Shipped:   Transplant: sirolimus 0.5mg     Other medication(s) to be shipped: No additional medications requested for fill at this time     TEMERA HOHNER, DOB: 05/12/1946  Phone: There are no phone numbers on file.      All above HIPAA information was verified with patient.     Was a Nurse, learning disability used for this call? No    Completed refill call assessment today to schedule patient's medication shipment from the [facility] [phone]).  All relevant notes have been reviewed.     Specialty medication(s) and dose(s) confirmed: Regimen is correct and unchanged.   Changes to medications: Carmencita reports no changes at this time.  Changes to insurance: No  New side effects reported not previously addressed with a pharmacist or physician: None reported  Questions for the pharmacist: No    Confirmed patient received a Conservation officer, historic buildings and a Surveyor, mining with first shipment. The patient will receive a drug information handout for each medication shipped and additional FDA Medication Guides as required.       DISEASE/MEDICATION-SPECIFIC INFORMATION        N/A    SPECIALTY MEDICATION ADHERENCE     Medication Adherence    Patient reported X missed doses in the last month: 0  Specialty Medication: Sirolimus 0.5mg   Patient is on additional specialty medications: No              Were doses missed due to medication being on hold? No    Sirolimus 0.5 mg: 13 days of medicine on hand     REFERRAL TO PHARMACIST     Referral to the pharmacist: Not needed      [facility]     Shipping address confirmed in Epic.       Delivery Scheduled: Yes, Expected medication delivery date: 05/28/23.     Medication will be delivered via Same Day Courier to the prescription address in Epic WAM.    Tera Helper, [facility]   [facility] Specialty Pharmacist

## 2023-05-17 ENCOUNTER — Emergency Department: Admit: 2023-05-17 | Discharge: 2023-05-18 | Disposition: A | Discharge status: Home / Self Care | Payer: MEDICARE

## 2023-05-17 ENCOUNTER — Ambulatory Visit: Admit: 2023-05-17 | Discharge: 2023-05-18 | Disposition: A | Discharge status: Home / Self Care | Payer: MEDICARE

## 2023-05-17 ENCOUNTER — Ambulatory Visit (INDEPENDENT_AMBULATORY_CARE_PROVIDER_SITE_OTHER): Payer: Medicare Other

## 2023-05-17 ENCOUNTER — Ambulatory Visit: Admission: EM | Admit: 2023-05-17 | Discharge: 2023-05-17 | Payer: Medicare Other

## 2023-05-17 DIAGNOSIS — S52502A Unspecified fracture of the lower end of left radius, initial encounter for closed fracture: Secondary | ICD-10-CM

## 2023-05-17 MED ADMIN — lidocaine 4 % patch 1 patch: 1 | TRANSDERMAL | @ 21:00:00

## 2023-05-17 NOTE — Unmapped (Signed)
Pt mopped floor slipped and fell left wrist is splinted by ortho [facility]  was sent here for further workup

## 2023-05-17 NOTE — Discharge Instructions (Addendum)
As we discussed, you have broken your distal radius and it is displaced which means that it has to be reduced to pull back into correct alignment so that it heals properly.  There is also a question as to whether or not you broke one of the bones in your wrist.  Please go to the emergency department at [facility] to be evaluated by their orthopedics and have your bone set.  Do not eat or drink anything between now and the time you are evaluated by orthopedics in the emergency department.

## 2023-05-17 NOTE — ED Notes (Signed)
Patient is being discharged from the Urgent Care and sent to the Emergency Department via POV . Per Katha Cabal, DO, patient is in need of higher level of care due to distal radius displacement. Patient is aware and verbalizes understanding of plan of care.  Vitals:   05/17/23 1424  BP: 135/78  Pulse: 75  Resp: 16  Temp: 99.1 F (37.3 C)  SpO2: 94%

## 2023-05-17 NOTE — ED Provider Notes (Signed)
[facility]    CSN: [id number] Arrival date & time: 05/17/23  1413      History   Chief Complaint Chief Complaint  Patient presents with   Arm Pain   Fall   Back Pain    HPI Cindy Fischer is a 77 y.o. female.   HPI  77 year old female with a significant past medical history of hypothyroidism, renal transplant, GERD, and diabetes presents for evaluation of pain in her left wrist after suffering a ground-level fall earlier today.  She reports that she just mopped her kitchen and slipped on the wet floor.  She landed flat on her back but did not hit her head and did not suffer loss of consciousness.  She denies nausea or vomiting or changes in vision.  She is having some mild pain in her low back but her most difficult pain is in her left wrist which does have swelling, deformity, and limited range of motion.  Past Medical History:  Diagnosis Date   Cancer (HCC) 2016   skin (nose)   Diabetes mellitus without complication (HCC)    End stage renal disease (HCC)    s/p renal transplant   Hypothyroidism    Pneumonia    Renal disorder     Patient Active Problem List   Diagnosis Date Noted   Syncope and collapse 05/13/2021   PAC (premature atrial contraction) 05/13/2021   PAD (peripheral artery disease) (HCC) 05/13/2021   Hyperlipidemia associated with type 2 diabetes mellitus (HCC) 05/13/2021   ESRD (end stage renal disease) (HCC) 05/13/2021   Subdural hematoma (HCC) 04/16/2021   Compression fracture of thoracolumbar vertebra (HCC) 04/16/2021   Multiple rib fractures 04/16/2021   Nephrolithiasis 04/16/2021   Backache 01/20/2021   Edema 01/20/2021   Essential hypertension 01/20/2021   Lumbar spondylosis with myelopathy 06/10/2020   Lymphedema 03/22/2020   GERD (gastroesophageal reflux disease) 02/09/2019   Atherosclerosis of native arteries of extremity with intermittent claudication (HCC) 08/17/2018   S/P kidney transplant 08/17/2018   Diabetes (HCC)  08/17/2018   Hyperlipidemia 08/17/2018   Numbness and tingling 07/16/2018   Tingling 07/16/2018   Erythrocytosis 09/24/2015   Thrombocytosis 09/24/2015   Breast swelling 08/26/2015   Immunosuppression (HCC) 07/22/2015   History of nonmelanoma skin cancer 05/31/2015   Aftercare following organ transplant 11/26/2014   Hypothyroidism 11/26/2014   Immunocompromised state (HCC) 11/26/2014   Quit smoking within past year 11/26/2014   Proliferative diabetic retinopathy (HCC) 04/10/2011    Past Surgical History:  Procedure Laterality Date   CATARACT EXTRACTION, BILATERAL     COLONOSCOPY  12/02/2010   COLONOSCOPY WITH PROPOFOL N/A 09/04/2016   Procedure: COLONOSCOPY WITH PROPOFOL;  Surgeon: Kieth Brightly, MD;  Location: [facility];  Service: Endoscopy;  Laterality: N/A;   EYE SURGERY     INSERTION OF DIALYSIS CATHETER  2014   removed 2014 (only had in for about 8 weeks)   laser surgery on eyes  1999   LOWER EXTREMITY ANGIOGRAPHY Right 10/16/2018   Procedure: LOWER EXTREMITY ANGIOGRAPHY;  Surgeon: Renford Dills, MD;  Location: [facility];  Service: Cardiovascular;  Laterality: Right;   LOWER EXTREMITY ANGIOGRAPHY Right 02/08/2021   Procedure: LOWER EXTREMITY ANGIOGRAPHY;  Surgeon: Renford Dills, MD;  Location: [facility];  Service: Cardiovascular;  Laterality: Right;   LOWER EXTREMITY ANGIOGRAPHY Right 03/02/2021   Procedure: LOWER EXTREMITY ANGIOGRAPHY;  Surgeon: Renford Dills, MD;  Location: [facility];  Service: Cardiovascular;  Laterality: Right;  NEPHRECTOMY TRANSPLANTED ORGAN  10/20/2014   SPINE SURGERY  11/10/2021   TONSILLECTOMY     TUBAL LIGATION      OB History   No obstetric history on file.      Home Medications    Prior to Admission medications   Medication Sig Start Date End Date Taking? Authorizing Provider  acetaminophen (TYLENOL) 650 MG CR tablet Take 1,300 mg by mouth every 8 (eight) hours as needed for  pain.   Yes [provider]  aspirin EC 81 MG tablet Take 81 mg by mouth daily.   Yes [provider]  atorvastatin (LIPITOR) 20 MG tablet Take 20 mg by mouth daily.   Yes [provider]  Cholecalciferol (VITAMIN D) 2000 units tablet Take 2,000 Units by mouth daily.   Yes [provider]  clopidogrel (PLAVIX) 75 MG tablet TAKE 1 TABLET BY MOUTH EVERY DAY 04/20/23  Yes Georgiana Spinner, NP  insulin NPH Human (HUMULIN N,NOVOLIN N) 100 UNIT/ML injection Inject 23 Units into the skin daily before breakfast.   Yes [provider]  insulin regular (NOVOLIN R,HUMULIN R) 100 units/mL injection Inject 3-10 Units into the skin 3 (three) times daily as needed for high blood sugar. Sliding scale   Yes [provider]  levothyroxine (SYNTHROID, LEVOTHROID) 100 MCG tablet Take 100 mcg by mouth See admin instructions. Take 100 mcg daily except skip dose on Saturdays   Yes [provider]  lisinopril (PRINIVIL,ZESTRIL) 10 MG tablet Take 10 mg by mouth daily.   Yes [provider]  omeprazole (PRILOSEC) 40 MG capsule Take 40 mg by mouth every other day.  06/26/18  Yes [provider]  predniSONE (DELTASONE) 5 MG tablet Take 5 mg by mouth daily.  05/08/18  Yes [provider]  Sirolimus (RAPAMUNE) 0.5 MG tablet Take by mouth. 01/25/23 01/25/24 Yes [provider]  tacrolimus (PROGRAF) 0.5 MG capsule Take 1-1.5 mg by mouth See admin instructions. Take 1.5 mg in the morning and 1 mg in the evening   Yes [provider]  torsemide (DEMADEX) 20 MG tablet Take 20 mg by mouth daily as needed. 07/11/21  Yes [provider]  Everolimus 0.5 MG TABS Take 1 mg by mouth 2 (two) times daily.    [provider]    Family History Family History  Problem Relation Age of Onset   Multiple myeloma Mother    Diabetes Father    Heart attack Father 62   Breast cancer Neg Hx     Social History Social History    Tobacco Use   Smoking status: Former    Packs/day: 1.00    Years: 35.00    Additional pack years: 0.00    Total pack years: 35.00    Types: Cigarettes    Quit date: 2015    Years since quitting: 9.3   Smokeless tobacco: Never  Vaping Use   Vaping Use: Never used  Substance Use Topics   Alcohol use: No   Drug use: No     Allergies   Latex, Voltaren [diclofenac sodium], Oxycodone-acetaminophen, and Penicillins   Review of Systems Review of Systems  Musculoskeletal:  Positive for arthralgias and joint swelling.  Skin:  Positive for color change.  Neurological:  Negative for weakness and numbness.     Physical Exam Triage Vital Signs ED Triage Vitals  Enc Vitals Group     BP 05/17/23 1424 135/78     Pulse Rate 05/17/23 1424 75  Resp 05/17/23 1424 16     Temp 05/17/23 1424 99.1 F (37.3 C)     Temp Source 05/17/23 1424 Oral     SpO2 05/17/23 1424 94 %     Weight 05/17/23 1423 162 lb 12.8 oz (73.8 kg)     Height 05/17/23 1423 5\' 2"  (1.575 m)     Head Circumference --      Peak Flow --      Pain Score 05/17/23 1429 4     Pain Loc --      Pain Edu? --      Excl. in GC? --    No data found.  Updated Vital Signs BP 135/78 (BP Location: Left Arm)   Pulse 75   Temp 99.1 F (37.3 C) (Oral)   Resp 16   Ht 5\' 2"  (1.575 m)   Wt 162 lb 12.8 oz (73.8 kg)   SpO2 94%   BMI 29.78 kg/m   Visual Acuity Right Eye Distance:   Left Eye Distance:   Bilateral Distance:    Right Eye Near:   Left Eye Near:    Bilateral Near:     Physical Exam Vitals and nursing note reviewed.  Constitutional:      Appearance: Normal appearance.  Musculoskeletal:        General: Swelling, tenderness, deformity and signs of injury present.  Skin:    General: Skin is warm.     Capillary Refill: Capillary refill takes less than 2 seconds.     Findings: Bruising present.  Neurological:     Mental Status: She is alert.      UC Treatments / Results  Labs (all labs ordered  are listed, but only abnormal results are displayed) Labs Reviewed - No data to display  EKG   Radiology No results found.  Procedures Procedures (including critical care time)  Medications Ordered in UC Medications - No data to display  Initial Impression / Assessment and Plan / UC Course  I have reviewed the triage vital signs and the nursing notes.  Pertinent labs & imaging results that were available during my care of the patient were reviewed by me and considered in my medical decision making (see chart for details).   Patient is a pleasant, nontoxic-appearing 77 year old female presenting for evaluation of left wrist pain and deformity after suffering a ground-level fall earlier today.  Patient was seen image above, there is swelling to the dorsal lateral aspect of the wrist.  Radial and ulnar deviation are markedly affected due to pain as well as flexion and extension of the wrist.  Patient has significant pain as well with pronation of the wrist.  Her cap refill is less than 3 seconds and she has full sensation in all of her fingertips.  Her grip strength is decreased secondary to pain.  The patient also has swelling on the medial volar aspect of the left wrist that is markedly tender to touch and is starting to develop ecchymosis.  She denies pain with compression of her radial ulnar styloids but unable to palpate the carpal bones secondary to discomfort.  I will obtain radiograph of left wrist to evaluate for possible fracture.  Left wrist x-ray independently reviewed and evaluated by me.  Impression: There is a comminuted, displaced, angulated fracture of the distal radius as well as a questionable fracture of the lunate carpal bone.  Radiology overread is pending.  I have ordered the patient to be placed in a sugar-tong splint to protect from  further injury and also sling and I will discharge her to [facility] to be evaluated by orthopedics and to have a reduction of her  distal radial fracture.   Final Clinical Impressions(s) / UC Diagnoses   Final diagnoses:  Closed fracture of distal end of left radius, unspecified fracture morphology, initial encounter     Discharge Instructions      As we discussed, you have broken your distal radius and it is displaced which means that it has to be reduced to pull back into correct alignment so that it heals properly.  There is also a question as to whether or not you broke one of the bones in your wrist.  Please go to the emergency department at [facility] to be evaluated by their orthopedics and have your bone set.  Do not eat or drink anything between now and the time you are evaluated by orthopedics in the emergency department.     ED Prescriptions   None    PDMP not reviewed this encounter.   Becky Augusta, NP 05/17/23 1536

## 2023-05-17 NOTE — ED Triage Notes (Signed)
Pt c/o L arm pain & back pain due to fall that occurred today. States she was mopping her kitchen & slipped. Denies any head injury of LOC.

## 2023-05-18 MED ADMIN — ondansetron (ZOFRAN-ODT) disintegrating tablet 4 mg: 4 mg | ORAL | @ 02:00:00 | Stop: 2023-05-17

## 2023-05-18 MED ADMIN — tacrolimus (PROGRAF) capsule 1 mg: 1 mg | ORAL | @ 01:00:00 | Stop: 2023-05-17

## 2023-05-18 MED ADMIN — lidocaine (XYLOCAINE) 10 mg/mL (1 %) injection 30 mL: 30 mL | @ 01:00:00 | Stop: 2023-05-17

## 2023-05-18 MED ADMIN — oxyCODONE (ROXICODONE) immediate release tablet 5 mg: 5 mg | ORAL | @ 02:00:00 | Stop: 2023-05-17

## 2023-05-18 NOTE — Unmapped (Signed)
ORTHOPAEDIC CONSULT  - Primary Service for this Patient: Emergency Medicine.  Patient is seen in consultation at the request of the [facility] ED for evaluation of the following:     ASSESSMENT AND PLAN:  Heidi Blankenship is a 77 y.o. right handed female with a history of with a history of T2DM, thyroid disease, and ESRD s/p renal transplant seen in consultation at the request of Jonetta Speak, MD for the evaluation of the following:     1) Left Distal Radius Fracture  2) Left transverse Lunate fracture  Closed. Displaced. Apex dorsal  Reduced and splinted per procedure note below, instructed to keep splint clean/dry/intact  No median nerve symptoms on evaluation.   Patient educated on strict return precautions which include worsening pain or numbness/tingling in thumb and index fingers despite strict elevation.   operative treatment is not anticipated  Consented for closed reduction in the ED.  Weight Bearing Status/Activity: weightbearing as tolerated.  3) R Zone 1 Sacral Fx  Likely chronic, nontender to palpation, has been ambulatory since fall   Non-op management expected with WBAT  Patient ambulates without assistance    - Recommended Additional Labs: No new labs needed.  - Pain control: per ED  - Best contact number: There are no phone numbers on file.   - Follow-up plan: [facility] will arrange appropriate orthopaedic follow up.    Paged at : [phone]  Called Back at : [phone]  Arrived at Bedside at : 1015    Pre-Operative Orthopaedic Risk Assessment:   Smoking:  Former smoker. Quit in 2015 after renal transplant  Home Oxygen Requirement: None  Hgb A1c:  Last A1c was 5.4 on 12/21/22  Bleeding and home anticoagulation meds:  She is on Plavix 75 mg, which was started after atherectomy and PTA with stent placement in right superifical femoral artery and right popliteal artery on 10/16/2018.  Height:         05/14/23 160 cm (5' 2.99)     Weight:          05/17/23 75.3 kg (166 lb)       This patient discussed with senior on call resident, consult will be staffed with on-call attending.    * Please contact the resident who leaves daily progress notes for any questions while patient is inpatient during weekdays.  * Please page orthopaedic consult pager [phone]) on nights (after 5PM) and weekends.    PROCEDURE(S)     CLOSED REDUCTION / SPLINTING  Informed consent was obtained and timeout called.   A hematoma block was performed at the fracture site with 20 cc of 1:1 1% lidocaine.  Closed reduction of the fracture was performed and a dorsal/volar slab short arm splint was applied.   Patient remained neurovascularly intact after reduction.   Post-reduction films showed adequate reduction.     Patient is to keep splint clean, dry, and intact until further treatment or follow-up appointment.    SUBJECTIVE     Chief Complaint:  Left Distal Radius Fracture    History of Present Illness:     Heidi Blankenship is a 77 y.o. right handed female, retired from various jobs , with relevant PMH as below who presents to the ED after sustaining a fall. Patient states that today she was taking a few steps in her kitchen when she felt her feet go out from under her. She cannot recall if she fell onto an outstretched arm. Denies head trauma or LOC. Denies numbness severe pain  in the finger tips. Denies significant numbness and tingling in the median nerve distrubution. Reports involvement of her low back from the fall but otherwise denies other additional injury. Was also having tenderness along her spine an dct demonstrated an age indeterminate r sacral fx she has already been bearing weight on it with minimal pain so will continue nonop    Medical History   Past Medical History:   Diagnosis Date    Allergic 1953. Penicillin    Very bad rash    Arthritis Oct. 2020    Pain & swelling. hands    Basal cell carcinoma     Complication of anesthesia 2013    Sick afterward    Diabetes mellitus (CMS-HCC)     Disease of thyroid gland     ESRD (end stage renal disease) on dialysis (CMS-HCC)     History of nonmelanoma skin cancer 05/31/2015      Surgical History   Past Surgical History:   Procedure Laterality Date    PR LIGATN ANGIOACCESS AV FISTULA Right 05/11/2015    Procedure: LIGATION OR BANDING OF ANGIOACCESS ARTERIOVENOUS FISTULA;  Surgeon: Purcell Mouton, MD;  Location: MAIN OR [place];  Service: Transplant    PR TRANSPLANT,PREP CADAVER RENAL GRAFT N/A 10/20/2014    Procedure: [facility] STD PREP CAD DONR RENAL ALLOGFT PRIOR TO TRNSPLNT, INCL DISSEC/REM PERINEPH FAT, DIAPH/RTPER ATTAC;  Surgeon: Purcell Mouton, MD;  Location: MAIN OR [place];  Service: Transplant    PR TRANSPLANTATION OF KIDNEY N/A 10/20/2014    Procedure: RENAL ALLOTRANSPLANTATION, IMPLANTATION OF GRAFT; WITHOUT RECIPIENT NEPHRECTOMY;  Surgeon: Purcell Mouton, MD;  Location: MAIN OR [place];  Service: Transplant    SKIN BIOPSY      TONSILLECTOMY  1953    TUBAL LIGATION  1985      Medications   Current Facility-Administered Medications   Medication Dose Route Frequency Provider Last Rate Last Admin    lidocaine 4 % patch 1 patch  1 patch Transdermal Once Olegario Messier, MD   1 patch at 05/17/23 1729     Current Outpatient Medications   Medication Sig Dispense Refill    acetaminophen (TYLENOL 8 HOUR) 650 MG CR tablet Take 2 tablets (1,300 mg total) by mouth.      aspirin (ECOTRIN) 81 MG tablet Take 1 tablet (81 mg total) by mouth daily.      atorvastatin (LIPITOR) 20 MG tablet Take 1 tablet (20 mg total) by mouth daily. 30 tablet 11    cholecalciferol, vitamin D3, (VITAMIN D3) 2,000 unit Tab Take 1 tablet by mouth daily. 90 each 5    clopidogreL (PLAVIX) 75 mg tablet Take 1 tablet (75 mg total) by mouth daily.      levothyroxine (SYNTHROID) 100 MCG tablet TAKE 1 TABLET BY MOUTH EVERY DAY (Patient taking differently: Take 1 tablet (100 mcg total) by mouth. Taking 100 mcg daily x 6 days/week.) 90 tablet 11    lisinopriL (PRINIVIL,ZESTRIL) 10 MG tablet TAKE 1 TABLET BY MOUTH EVERY DAY 90 tablet 4    NOVOLIN N NPH U-100 INSULIN 100 unit/mL injection INJECT 30 UNITS EVERY MORNING AND 6 UNITS EVERY EVENING      NOVOLIN R REGULAR U100 INSULIN 100 unit/mL injection INJECT 10 UNITS WITH BREAKFAST, 5 UNITS WITH LUNCH AND 5 UNITS WITH DINNER      omeprazole (PRILOSEC) 40 MG capsule Take 1 capsule (40 mg total) by mouth every other day.      predniSONE (DELTASONE) 5 MG tablet Take 1 tablet (  5 mg total) by mouth daily. 30 tablet 11    sirolimus (RAPAMUNE) 0.5 mg tablet Take 2 tablets (1 mg total) by mouth in the morning. 180 tablet 3    tacrolimus (PROGRAF) 0.5 MG capsule TAKE 3 CAPSULES (1.5 MG) IN THE MORNING AND 2 CAPSULES (1 MG) IN THE EVENING 150 capsule 6    torsemide (DEMADEX) 20 MG tablet Take 1 tablet (20 mg total) by mouth daily as needed.        Allergies   Diclofenac sodium, Latex, Penicillins, and Percocet [oxycodone-acetaminophen]     Social History Employment: retired.  Family/friend support: Family/friends present at time of consult..    Tobacco use:   Social History     Tobacco Use   Smoking Status Former    Current packs/day: 0.00    Average packs/day: 0.5 packs/day for 35.0 years (17.5 ttl pk-yrs)    Types: Cigarettes    Start date: 10/21/1979    Quit date: 10/20/2014    Years since quitting: 8.5   Smokeless Tobacco Never   Tobacco Comments    Off and on use          Family History   Family History   Problem Relation Age of Onset    Multiple myeloma Mother     Diabetes Father     Diabetes Sister     Melanoma Neg Hx     Basal cell carcinoma Neg Hx     Squamous cell carcinoma Neg Hx            OBJECTIVE     PHYSICAL EXAM   Constitutional   Vitals  Estimated body mass index is 29.41 kg/m?? as calculated from the following:    Height as of 05/14/23: 160 cm (5' 2.99).    Weight as of this encounter: 75.3 kg (166 lb).   Vitals:    05/17/23 1641   BP: 161/84   Pulse:    Resp:    Temp: 36.9 ??C (98.4 ??F)   SpO2:         General appearance  well-nourished, no acute distress   Psychiatric Orientation: Based on my interaction with the patient, I determined that she is appropriately oriented.  Mood and Affect: alert, cooperative, and pleasant   Respiratory Respiratory effort is not labored, without evidence of pain or SOB.   Genitourinary not applicable.   Hematologic /lymphatic trauma: normal bruising or hematoma, consistent with level of trauma.   Cardiovascular See vascular (pulses) examination under extremities.   Neurologic See specific peripheral nerve exam under extremities.   Skin See lacerations or skin injuries under extremities.   Musculoskeletal   Extremities:  LUE: Diffuse swelling and tenderness over the wrist. Ecchymosis. Skin intact. No tenting, impending open fracture. Otherwise remaining extremity is nontender to palpation, with full and painless ROM throughout fingers, elbow, and shoulder. Intact motor in deltoid, EPL, FPL, 2nd finger FDP, and interossei muscles. SILT in axillary, median, radial, and ulnar distributions.  Two-point discrimination deferred.  2+ radial pulse with warm and well perfused digits. Compartments soft and compressible with minimal pain on distracted passive flexion/extension of fingers.    RUE: No swelling, ecchymoses, deformity, or effusion. Skin intact. No tenting, impending open fracture. Nontender to palpation, with full and painless ROM throughout. + Motor in Axillary, AIN, PIN, Ulnar distributions. SILT in axillary, median, radial, and ulnar distributions. 2-point discrimination deferred. 2+ radial pulse with warm and well perfused digits. Compartments soft and compressible.     RLE: No swelling,  ecchymoses, deformity, or effusion. Skin intact.  No tenting, impending open fracture. Nontender to palpation, with full and painless ROM throughout. + GS/TA/EHL. SILT in DP/SP/S/S/T distributions.  2-point discrimination deferred.  2+ DP pulse with warm and well perfused toes. Ligamentous exam stable. Compartments soft and compressible, with no pain on passive stretch.      LLE: No swelling, ecchymoses, deformity, or effusion. Skin intact. No tenting, impending open fracture.Nontender to palpation, with full and painless ROM throughout. + GS/TA/EHL. SILT in DP/SP/S/S/T distributions. 2-point discrimination deferred. 2+ DP pulse with warm and well perfused toes. Ligamentous exam stable. Compartments soft and compressible, with no pain on passive stretch.           Test Results  Imaging  Radiology studies were personally reviewed.    XR of the left wrist showing a distal radius fracture.   Post-reduction XR of the left wrist demonstrate adequate alignment with some shortening:  inclination 26 deg   height 11.7 mm  Volar tilt 9 deg  CT lumbar spine demonstrated age indeterminate nondisplaced r sacral zone 1 fx.  Labs  Lab Results   Component Value Date    WBC 8.3 04/26/2023    HGB 13.4 04/26/2023    HCT 40.1 04/26/2023    PLT 578 (H) 04/26/2023      No results for input(s): SPECTYPEART, PHART, PCO2ART, PO2ART, HCO3ART, BEART, O2SATART in the last 24 hours.   Lab Results   Component Value Date    NA 132 (L) 04/26/2023    K 4.0 04/26/2023    CR 1.11 01/21/2015    GLU 102 (H) 04/26/2023     Lab Results   Component Value Date    PT 10.3 11/27/2011    INR 1.0 10/20/2014    APTT 32.8 10/20/2014     Lab Results   Component Value Date    CRP <0.5 11/27/2011     No results found for: ALB                  Comorbidities  See primary team documentation, or consult notes from medical or trauma services.    Patient Active Problem List   Diagnosis    Type II diabetes mellitus (CMS-HCC)    Hyperlipidemia    Proliferative diabetic retinopathy (CMS-HCC)    Aftercare following organ transplant    Hypothyroidism    Quit smoking within past year    Immunocompromised state (CMS-HCC)    Diabetes mellitus type 2 in obese    Immunosuppression (CMS-HCC)    Breast swelling    Thrombocytosis    Erythrocytosis    History of nonmelanoma skin cancer       Note created by Madilyn Fireman, May 17, 2023 7:55 PM

## 2023-05-18 NOTE — Unmapped (Signed)
Tacrolimus/Sirolimus Therapeutic Monitoring Pharmacy Note    Heidi Blankenship is a 77 y.o. female continuing tacrolimus.     Indication: Kidney transplant     Date of Transplant:  10/20/14       Prior Dosing Information: Tacrolimus Home regimen 1.5 mg QAM and 1 mg QPM ; Sirolimus 1 mg daily     Source(s) of information used to determine prior to admission dosing: Clinic Note    Goals:  Therapeutic Drug Levels  Tacrolimus trough goal:  2-4 ng/ml  Sirolimus trough goal: 3-8 ng/ml    Additional Clinical Monitoring/Outcomes  Monitor renal function (SCr and urine output) and liver function (LFTs)  Monitor for signs/symptoms of adverse events (e.g., hyperglycemia, hyperkalemia, hypomagnesemia, hypertension, headache, tremor)    Results:   Tacrolimus level: Not applicable    Pharmacokinetic Considerations and Significant Drug Interactions:  Concurrent hepatotoxic medications: None identified  Concurrent CYP3A4 substrates/inhibitors: None identified  Concurrent nephrotoxic medications: None identified    Assessment/Plan:  Recommendedation(s)  Continue current regimen of tacrolimus 1.5 mg QAM and 1 mg QPM  Continue current regimen of sirolimus 1 mg daily    Follow-up  Next level to be determined by primary team.   A pharmacist will continue to monitor and recommend levels as appropriate    Please page service pharmacist with questions/clarifications.    Meridee Score, PharmD

## 2023-05-18 NOTE — Unmapped (Shared)
[facility]  Emergency Department Provider Note    ED Clinical Impression     Final diagnoses:   Closed fracture of left wrist, initial encounter (Primary)     HPI, ED Course, Assessment and Plan     Initial Clinical Impression:    May 17, 2023 5:12 PM   Heidi Blankenship is a 77 y.o. female with past medical history of arthritis, DM, thyroid disease, ESRD (s/p renal transplant) presenting for evaluation of left wrist and right lower back pain as a result of a mechanical fall today.  The patient reports slipping on the kitchen floor today falling backward onto her back, and she tried to catch her fall with her left hand. No LOC or head strike. She is anticoagulated (on Plavix).  Patient was able to ambulate after falling. She currently endorses diffuse back pain, most prominent in the right lower back as well as left wrist pain. Per chart review, she was previously splinted by ortho [facility] P[facility] and directed to the ED for further evaluation. She denies chest pain, lightheadedness, and heart palpations prior to falling. Of note, the patient reports a history of chronic lower back pain for which she has previously required surgery. She denies fevers, chills, emesis, blurry vision, or diarrhea.     BP 161/84  - Pulse 83  - Temp 36.9 ??C (98.4 ??F) (Oral)  - Resp 16  - Wt 75.3 kg (166 lb)  - LMP  (LMP Unknown)  - SpO2 99%  - BMI 29.41 kg/m??     On exam, patient is well-appearing, no acute distress.  Vitals are stable.  Head atraumatic, normocephalic.  Midface is stable, pupils equal and reactive to light, extract movements intact.  Cervical spine nontender to palpation.  Mild tenderness to palpation over lower thoracic spine and upper lumbar spine, no step-offs or deformities.  Tenderness is primarily over the paraspinal muscles on the right side, however does have mild tenderness to palpation over midline.  Lungs clear to auscultation bilaterally.  Abdomen is soft, nondistended, nontender to palpation.  Pelvis stable nontender to palpation.  Left forearm and sugar-tong splint, fingers warm to the touch with full range of motion and normal sensation.  Full range of motion of bilateral lower extremities with no tenderness to palpation.    Medical Decision Making    Differential diagnosis includes but is not limited to fracture, dislocation, sprain/strain, contusion, among others.  Patient denies any preceding symptoms, has been otherwise healthy, low suspicion for any presyncopal event and suspect patient had a mechanical fall.  She is on Plavix, however no head strike, no loss of consciousness, no vomiting.  Do not feel patient requires CT head at this time.  Urgent care obtained x-ray showing distal radius fracture of the left arm with displacement, placed in sugar-tong splint.  Will obtain repeat x-rays and likely consult orthopedic surgery. Will also obtain CT thoracic and lumbar spine. Will place lidocaine patch.     Further ED updates and updates to plan as per ED Course below:    ED Course as of 05/17/23 1904   Thu May 17, 2023   1901 X-rays independently interpreted, shows L radial fracture with displacement. Orthopedic surgery consulted, will perform reduction       External Records Reviewed: I have reviewed recent and relevant previous record, including: External ED note - 05/17/2023 ED Provider Note [facility] for review of PMH and ortho evaluation.    Independent Interpretation of Studies: I have independently  interpreted the following studies:  X-ray   CT lumbar/thoracic spine    Discussion of Management With Other Providers or Support Staff: I discussed the management of this patient with the:  ***    Considerations Regarding Disposition/Escalation of Care and Critical Care:  ***    Social determinants that significantly affected care: None    Prescription drug(s) considered but not prescribed: None    Diagnostic tests considered but not performed: None    Social Determinants of Health with Concerns     Financial Resource Strain: Not on file   Internet Connectivity: Not on file   Food Insecurity: Not on file   Tobacco Use: Medium Risk (05/17/2023)    Received from [facility]    Patient History     Smoking Tobacco Use: Former     Smokeless Tobacco Use: Never     Passive Exposure: Not on file   Housing/Utilities: Not on file   Alcohol Use: Not on file   Transportation Needs: Not on file   Substance Use: Not on file   Health Literacy: Not on file   Physical Activity: Not on file   Interpersonal Safety: Not on file   Stress: Not on file   Intimate Partner Violence: Not on file   Depression: Not on file   Social Connections: Not on file     _____________________________________________________________________    The case was discussed with the attending physician who is in agreement with the above assessment and plan    Past History     PAST MEDICAL HISTORY/PAST SURGICAL HISTORY:   Past Medical History:   Diagnosis Date    Allergic 1953. Penicillin    Very bad rash    Arthritis Oct. 2020    Pain & swelling. hands    Basal cell carcinoma     Complication of anesthesia 2013    Sick afterward    Diabetes mellitus (CMS-HCC)     Disease of thyroid gland     ESRD (end stage renal disease) on dialysis (CMS-HCC)     History of nonmelanoma skin cancer 05/31/2015       Past Surgical History:   Procedure Laterality Date    PR LIGATN ANGIOACCESS AV FISTULA Right 05/11/2015    Procedure: LIGATION OR BANDING OF ANGIOACCESS ARTERIOVENOUS FISTULA;  Surgeon: Purcell Mouton, MD;  Location: MAIN OR [place] [facility];  Service: Transplant    PR TRANSPLANT,PREP CADAVER RENAL GRAFT N/A 10/20/2014    Procedure: BACKBNCH STD PREP CAD DONR RENAL ALLOGFT PRIOR TO TRNSPLNT, INCL DISSEC/REM PERINEPH FAT, DIAPH/RTPER ATTAC;  Surgeon: Purcell Mouton, MD;  Location: MAIN OR [place];  Service: Transplant    PR TRANSPLANTATION OF KIDNEY N/A 10/20/2014    Procedure: RENAL ALLOTRANSPLANTATION, IMPLANTATION OF GRAFT; WITHOUT RECIPIENT NEPHRECTOMY;  Surgeon: Purcell Mouton, MD;  Location: MAIN OR ;  Service: Transplant    SKIN BIOPSY      TONSILLECTOMY  1953    TUBAL LIGATION  1985       MEDICATIONS:     Current Facility-Administered Medications:     lidocaine 4 % patch 1 patch, 1 patch, Transdermal, Once, Olegario Messier, MD, 1 patch at 05/17/23 1729    Current Outpatient Medications:     acetaminophen (TYLENOL 8 HOUR) 650 MG CR tablet, Take 2 tablets (1,300 mg total) by mouth., Disp: , Rfl:     aspirin (ECOTRIN) 81 MG tablet, Take 1 tablet (81 mg total) by mouth daily., Disp: , Rfl:     atorvastatin (LIPITOR) 20  MG tablet, Take 1 tablet (20 mg total) by mouth daily., Disp: 30 tablet, Rfl: 11    cholecalciferol, vitamin D3, (VITAMIN D3) 2,000 unit Tab, Take 1 tablet by mouth daily., Disp: 90 each, Rfl: 5    clopidogreL (PLAVIX) 75 mg tablet, Take 1 tablet (75 mg total) by mouth daily., Disp: , Rfl:     levothyroxine (SYNTHROID) 100 MCG tablet, TAKE 1 TABLET BY MOUTH EVERY DAY (Patient taking differently: Take 1 tablet (100 mcg total) by mouth. Taking 100 mcg daily x 6 days/week.), Disp: 90 tablet, Rfl: 11    lisinopriL (PRINIVIL,ZESTRIL) 10 MG tablet, TAKE 1 TABLET BY MOUTH EVERY DAY, Disp: 90 tablet, Rfl: 4    NOVOLIN N NPH U-100 INSULIN 100 unit/mL injection, INJECT 30 UNITS EVERY MORNING AND 6 UNITS EVERY EVENING, Disp: , Rfl:     NOVOLIN R REGULAR U100 INSULIN 100 unit/mL injection, INJECT 10 UNITS WITH BREAKFAST, 5 UNITS WITH LUNCH AND 5 UNITS WITH DINNER, Disp: , Rfl:     omeprazole (PRILOSEC) 40 MG capsule, Take 1 capsule (40 mg total) by mouth every other day., Disp: , Rfl:     predniSONE (DELTASONE) 5 MG tablet, Take 1 tablet (5 mg total) by mouth daily., Disp: 30 tablet, Rfl: 11    sirolimus (RAPAMUNE) 0.5 mg tablet, Take 2 tablets (1 mg total) by mouth in the morning., Disp: 180 tablet, Rfl: 3    tacrolimus (PROGRAF) 0.5 MG capsule, TAKE 3 CAPSULES (1.5 MG) IN THE MORNING AND 2 CAPSULES (1 MG) IN THE EVENING, Disp: 150 capsule, Rfl: 6    torsemide (DEMADEX) 20 MG tablet, Take 1 tablet (20 mg total) by mouth daily as needed., Disp: , Rfl:     ALLERGIES:   Diclofenac sodium, Latex, Penicillins, and Percocet [oxycodone-acetaminophen]    SOCIAL HISTORY:   Social History     Tobacco Use    Smoking status: Former     Current packs/day: 0.00     Average packs/day: 0.5 packs/day for 35.0 years (17.5 ttl pk-yrs)     Types: Cigarettes     Start date: 10/21/1979     Quit date: 10/20/2014     Years since quitting: 8.5    Smokeless tobacco: Never    Tobacco comments:     Off and on use   Substance Use Topics    Alcohol use: Not Currently       FAMILY HISTORY:  Family History   Problem Relation Age of Onset    Multiple myeloma Mother     Diabetes Father     Diabetes Sister     Melanoma Neg Hx     Basal cell carcinoma Neg Hx     Squamous cell carcinoma Neg Hx           Review of Systems     A review of systems was performed and relevant portions were as noted above in HPI     Physical Exam     VITAL SIGNS:    BP 161/84  - Pulse 83  - Temp 36.9 ??C (98.4 ??F) (Oral)  - Resp 16  - Wt 75.3 kg (166 lb)  - LMP  (LMP Unknown)  - SpO2 99%  - BMI 29.41 kg/m??     Constitutional:   Alert and oriented.   Head:   Normocephalic and atraumatic  Eyes:   Conjunctivae are normal, EOMI, PERRL  ENT:   No notable congestion, Mucous membranes moist, External ears normal, no notable stridor  Cardiovascular:  Rate as vitals above. Appears warm and well perfused  Respiratory:   Normal respiratory effort. Breath sounds are normal.  Gastrointestinal:   Soft, non-distended, and nontender.   Genitourinary:   Deferred  Musculoskeletal:    L forearm in sugar tong splint, L fingers with intact sensation and movement. Back atraumatic, mild tenderness to palpation over lower thoracic/upper lumbar spine. No step offs or deformities.  Neurologic:   No gross focal neurologic deficits beyond baseline are appreciated.  Skin:   Skin is warm, dry and intact.       Radiology     XR Wrist 3 Or More Views Left   Final Result 1. Evaluation limited due to overlying splint material.   2. Impacted acute distal left radial metaphyseal fracture with intra-articular extension.   3. Acute lunate fracture.      XR Hand 3 Or More Views Left   Final Result      1. Evaluation limited due to overlying splint material.   2. Impacted acute distal left radial metaphyseal fracture with intra-articular extension.   3. Acute lunate fracture.      XR Forearm Left   Final Result      1. Evaluation limited due to overlying splint material.   2. Impacted acute distal left radial metaphyseal fracture with intra-articular extension.   3. Acute lunate fracture.      CT Thoracic spine WO contrast    (Results Pending)   CT lumbar spine WO contrast    (Results Pending)       Labs     Labs Reviewed - No data to display      Pertinent labs & imaging results that were available during my care of the patient were reviewed by me and considered in my medical decision making (see chart for details).    Please note- This chart has been created using [employer]. Chart creation errors have been sought, but may not always be located and such creation errors, especially pronoun confusion, do NOT reflect on the standard of medical care.    Documentation assistance was provided by Lucas Mallow and Lillia Mountain, Scribes, on May 17, 2023 at 5:12 PM for Herma Carson, MD    Documentation assistance was provided by the scribe in my presence.  The documentation recorded by the scribe has been reviewed by me and accurately reflects the services I personally performed.

## 2023-05-20 MED ORDER — PREDNISONE 5 MG TABLET
ORAL_TABLET | Freq: Every day | ORAL | 11 refills | 0 days
Start: 2023-05-20 — End: ?

## 2023-05-22 MED ORDER — PREDNISONE 5 MG TABLET
ORAL_TABLET | Freq: Every day | ORAL | 11 refills | 30 days | Status: CP
Start: 2023-05-22 — End: ?

## 2023-05-22 NOTE — Unmapped (Signed)
The patient is requesting a medication refill

## 2023-05-24 ENCOUNTER — Ambulatory Visit: Admit: 2023-05-24 | Discharge: 2023-05-25 | Payer: MEDICARE

## 2023-05-24 MED ORDER — OXYCODONE 5 MG TABLET
ORAL_TABLET | Freq: Three times a day (TID) | ORAL | 0 refills | 5 days | Status: CP | PRN
Start: 2023-05-24 — End: ?

## 2023-05-24 NOTE — Unmapped (Signed)
ORTHOPAEDIC NOTE     Cambrea Kirt L. Foy Vanduyne, PA-C        CELISA ORTLOFF    MRN: [id number]  DOB: May 04, 1946    Date of visit: 05/24/2023    Clinic location: [place]     ASSESSMENT:     Stable alignment of left distal radius with lunate and ulnar styloid fractures reduced on 05/17/2023     PLAN:     Well molded short arm cast was applied in clinic today on the left upper extremity  She will work on range of motion exercise her fingers  Limited supply of oxycodone was provided for pain  She will contact her primary care physician regarding bone density scan  -Advised OTC analgesic PRN pain  -Discussed treatment options and patient was amenable to the above plan and was instructed to call and be seen if there is any increasing pain or concerns.     Follow up: 1 week, 3 views left wrist in cast       Chief Complaint:     Left wrist injury     SUBJECTIVE:     HPI: Heidi Blankenship is a RHD 77 y.o. with a PMHx ESRD on dialysis, osteoporosis, T2DM, thyroid disease presenting to Clinic for evaluation of left wrist injury sustained on 05/17/2023 when she fell while in her kitchen from a ground-level height.  She presented to the emergency department x-rays revealed distal radius fracture which was reduced and splinted.  She states that she is doing well but she does have some swelling in her fingers.  Pain is poorly controlled with Tylenol.  She has taken oxycodone in the past without adverse reaction.       Allergies  Allergies   Allergen Reactions    Diclofenac Sodium Diarrhea and Nausea And Vomiting    Latex Itching    Penicillins Rash    Percocet [Oxycodone-Acetaminophen] Nausea And Vomiting     Past Medical History  Past Medical History:   Diagnosis Date    Allergic 1953. Penicillin    Very bad rash    Arthritis Oct. 2020    Pain & swelling. hands    Basal cell carcinoma     Complication of anesthesia 2013    Sick afterward    Diabetes mellitus (CMS-HCC)     Disease of thyroid gland     ESRD (end stage renal disease) on dialysis (CMS-HCC)     History of nonmelanoma skin cancer 05/31/2015        PHYSICAL EXAM:     MSK: Left wrist in splint  Inspection: Mild edema and ecchymosis along the fingers without erythema, skin intact  Palpation: No pain with passive stretch of the digits  ROM: Swelling limits range of motion of the digits  Strength: Intact pincer grasp strength  normal sensation LUE  radial pulses easily palpable     Imaging   Four views of the left Wrist independently reviewed and interpreted by myself show stable alignment of left distal radius fracture with ulnar styloid fracture and lunate fracture. No other obvious fractures, lucencies, dislocations, or acute abnormalities.    MEDICAL DECISION MAKING (level of service defined by 2/3 elements)     Number/Complexity of Problems Addressed 1 acute, uncomplicated illness or injury (99203/99213)   Amount/Complexity of Data to be Reviewed/Analyzed Independent interpretation of a test performed by another physician/other qualified health care professional (99204/99214)   Risk of Complications/Morbidity/Mortality of Management Closed Fracture Treatment WITHOUT Manipulation (99204/99214)   DME ORDER:  Dx:  ,                   cc:  Jaclyn Shaggy, MD  *Patient note was created using Dragon Dictation sotware. Errors in syntax or grammar may not have been identified and edited on initial review.

## 2023-05-26 ENCOUNTER — Other Ambulatory Visit: Payer: Self-pay

## 2023-05-26 ENCOUNTER — Emergency Department
Admission: EM | Admit: 2023-05-26 | Discharge: 2023-05-26 | Disposition: A | Discharge status: Home / Self Care | Payer: Medicare HMO | Attending: Emergency Medicine | Admitting: Emergency Medicine

## 2023-05-26 DIAGNOSIS — E11649 Type 2 diabetes mellitus with hypoglycemia without coma: Secondary | ICD-10-CM | POA: Insufficient documentation

## 2023-05-26 DIAGNOSIS — E162 Hypoglycemia, unspecified: Secondary | ICD-10-CM | POA: Diagnosis present

## 2023-05-26 DIAGNOSIS — Z794 Long term (current) use of insulin: Secondary | ICD-10-CM | POA: Diagnosis not present

## 2023-05-26 LAB — CBC
HCT: 44.8 % (ref 36.0–46.0)
Hemoglobin: 14.3 g/dL (ref 12.0–15.0)
MCH: 28.9 pg (ref 26.0–34.0)
MCHC: 31.9 g/dL (ref 30.0–36.0)
MCV: 90.5 fL (ref 80.0–100.0)
Platelets: 747 10*3/uL — ABNORMAL HIGH (ref 150–400)
RBC: 4.95 MIL/uL (ref 3.87–5.11)
RDW: 15.6 % — ABNORMAL HIGH (ref 11.5–15.5)
WBC: 15.1 10*3/uL — ABNORMAL HIGH (ref 4.0–10.5)
nRBC: 0 % (ref 0.0–0.2)

## 2023-05-26 LAB — COMPREHENSIVE METABOLIC PANEL
ALT: 14 U/L (ref 0–44)
AST: 22 U/L (ref 15–41)
Albumin: 3.3 g/dL — ABNORMAL LOW (ref 3.5–5.0)
Alkaline Phosphatase: 53 U/L (ref 38–126)
Anion gap: 9 (ref 5–15)
BUN: 34 mg/dL — ABNORMAL HIGH (ref 8–23)
CO2: 21 mmol/L — ABNORMAL LOW (ref 22–32)
Calcium: 9.1 mg/dL (ref 8.9–10.3)
Chloride: 103 mmol/L (ref 98–111)
Creatinine, Ser: 1.01 mg/dL — ABNORMAL HIGH (ref 0.44–1.00)
GFR, Estimated: 57 mL/min — ABNORMAL LOW (ref >60)
Glucose, Bld: 178 mg/dL — ABNORMAL HIGH (ref 70–99)
Potassium: 4.7 mmol/L (ref 3.5–5.1)
Sodium: 133 mmol/L — ABNORMAL LOW (ref 135–145)
Total Bilirubin: 1 mg/dL (ref 0.3–1.2)
Total Protein: 5.8 g/dL — ABNORMAL LOW (ref 6.5–8.1)

## 2023-05-26 LAB — URINALYSIS, ROUTINE W REFLEX MICROSCOPIC
Bilirubin Urine: NEGATIVE
Glucose, UA: NEGATIVE mg/dL
Hgb urine dipstick: NEGATIVE
Ketones, ur: 5 mg/dL — AB
Leukocytes,Ua: NEGATIVE
Nitrite: NEGATIVE
Protein, ur: NEGATIVE mg/dL
Specific Gravity, Urine: 1.027 (ref 1.005–1.030)
pH: 5 (ref 5.0–8.0)

## 2023-05-26 LAB — CBG MONITORING, ED: Glucose-Capillary: 76 mg/dL (ref 70–99)

## 2023-05-26 NOTE — ED Provider Notes (Signed)
[facility] Provider Note    Event Date/Time   First MD Initiated Contact with Patient 05/26/23 1509     (approximate)   History   Chief Complaint: Hypoglycemia (Patient is here today for hypoglycemia - took her CBG at home which was between 45-59, then she took 28 units of long lasting insulin and did not eat; When FIRE arrived, her CBG was 45 and she was given 2 tubes of oral glucose, 2 Baby Ruths, and juice; CBG here is 67)   HPI  Cindy Fischer is a 77 y.o. female with a history of insulin-dependent diabetes and ESRD status post renal transplant who comes to the ED due to low blood sugar and confusion this morning.  Patient reports that she has intermittent episodes of low blood sugar, typically in the morning.  Her doctor advised her to eat a bedtime snack which has helped but sometimes it still happens.  This morning she woke up, blood sugar was about 50 so she ate some jellybeans and went back to sleep.  Later on in the morning, she got out of bed and was hallucinating that she was in a forest.  She heard her sister telling her that their neighbor who is an EMT was coming to help her, but mentally could not get out of the forest hallucination.  She denies any recent illness.  She does have some right lower back pain from a fall a week ago.  Denies dysuria fever chest pain shortness of breath headache neck pain vision changes or new neurologic deficits.  Currently she feels back to normal after receiving supplemental sugars and CBG increasing to the 70s.     Physical Exam   Triage Vital Signs: ED Triage Vitals  Enc Vitals Group     BP 05/26/23 1427 (!) 140/85     Pulse Rate 05/26/23 1422 83     Resp 05/26/23 1422 18     Temp 05/26/23 1427 (!) 97.5 F (36.4 C)     Temp Source 05/26/23 1427 Oral     SpO2 05/26/23 1422 98 %     Weight 05/26/23 1422 159 lb 4.8 oz (72.3 kg)     Height 05/26/23 1422 5\' 2"  (1.575 m)     Head Circumference --      Peak Flow  --      Pain Score 05/26/23 1422 0     Pain Loc --      Pain Edu? --      Excl. in GC? --     Most recent vital signs: Vitals:   05/26/23 1427 05/26/23 1430  BP: (!) 140/85 (!) 172/73  Pulse:  79  Resp:  11  Temp: (!) 97.5 F (36.4 C)   SpO2:  98%    General: Awake, no distress.  CV:  Good peripheral perfusion.  Regular rate and rhythm Resp:  Normal effort.  Clear to auscultation bilaterally Abd:  No distention.  Soft nontender Other:  Head atraumatic.  No bruising.  No midline spinal tenderness.  There is some mild tenderness in the right lower back musculature which reproduces her pain.   ED Results / Procedures / Treatments   Labs (all labs ordered are listed, but only abnormal results are displayed) Labs Reviewed  CBC - Abnormal; Notable for the following components:      Result Value   WBC 15.1 (*)    RDW 15.6 (*)    Platelets 747 (*)    All other  components within normal limits  COMPREHENSIVE METABOLIC PANEL - Abnormal; Notable for the following components:   Sodium 133 (*)    CO2 21 (*)    Glucose, Bld 178 (*)    BUN 34 (*)    Creatinine, Ser 1.01 (*)    Total Protein 5.8 (*)    Albumin 3.3 (*)    GFR, Estimated 57 (*)    All other components within normal limits  URINALYSIS, ROUTINE W REFLEX MICROSCOPIC - Abnormal; Notable for the following components:   Color, Urine YELLOW (*)    APPearance CLEAR (*)    Ketones, ur 5 (*)    All other components within normal limits  CBG MONITORING, ED     EKG Interpreted by me Sinus rhythm rate of 85.  Right axis, right bundle branch block.  No acute ischemic changes.   RADIOLOGY    PROCEDURES:  Procedures   MEDICATIONS ORDERED IN ED: Medications - No data to display   IMPRESSION / MDM / ASSESSMENT AND PLAN / ED COURSE  I reviewed the triage vital signs and the nursing notes.  DDx: Dehydration, electrolyte abnormality, hypoglycemia, muscle contusion, UTI  Patient's presentation is most consistent  with acute presentation with potential threat to life or bodily function.  Patient brought to the ED with hypoglycemia which is improved with oral intake.  No abdominal pain nausea or vomiting.  Exam is benign.  Will give food, follow-up labs, recheck CBG.   ----------------------------------------- 5:29 PM on 05/26/2023 ----------------------------------------- Patient eating in the ED, feeling well.  Repeat blood sugar is 170, stable for discharge.  Counseled on avoiding hypoglycemia, no bedtime insulin, eat a substantial bedtime snack, follow-up with primary care.      FINAL CLINICAL IMPRESSION(S) / ED DIAGNOSES   Final diagnoses:  Type 2 diabetes mellitus with hypoglycemia without coma, with long-term current use of insulin (HCC)     Rx / DC Orders   ED Discharge Orders     None        Note:  This document was prepared using Dragon voice recognition software and may include unintentional dictation errors.   Sharman Cheek, MD 05/26/23 1729

## 2023-05-26 NOTE — ED Triage Notes (Signed)
Patient is here today for hypoglycemia - took her CBG at home which was between 45-59, then she took 28 units of long lasting insulin and did not eat; When FIRE arrived, her CBG was 45 and she was given 2 tubes of oral glucose, 2 Baby Ruths, and juice; CBG here is 76

## 2023-05-26 NOTE — Discharge Instructions (Signed)
Continue taking your mealtime insulin as usual, as well as your morning insulin.  Do not take insulin at bedtime to try to avoid these low blood sugar episodes.  Continue eating a bedtime snack such as peanut butter and crackers.

## 2023-05-28 DIAGNOSIS — Z940 Kidney transplant status: Principal | ICD-10-CM

## 2023-05-28 MED FILL — SIROLIMUS 0.5 MG TABLET: ORAL | 30 days supply | Qty: 60 | Fill #4

## 2023-05-28 NOTE — Unmapped (Signed)
Heidi Blankenship 's Sirolimus shipment will be delayed as a result of a different copay.      I have reached out to the patient  at [phone] and was unable to leave a message. We will send a text message to the patient and wait for a call back from the patient to reschedule the delivery.  We have not confirmed the new delivery date.

## 2023-05-29 DIAGNOSIS — E1169 Type 2 diabetes mellitus with other specified complication: Principal | ICD-10-CM

## 2023-05-29 DIAGNOSIS — E669 Obesity, unspecified: Principal | ICD-10-CM

## 2023-05-29 MED ORDER — LISINOPRIL 10 MG TABLET
ORAL_TABLET | Freq: Every day | ORAL | 3 refills | 90 days | Status: CP
Start: 2023-05-29 — End: 2024-05-28

## 2023-05-29 NOTE — Unmapped (Signed)
per md last note continue Lisinopril 10 mg take 1 tab po daily qty 90 with 3 refills

## 2023-05-30 ENCOUNTER — Ambulatory Visit: Admit: 2023-05-30 | Discharge: 2023-05-31 | Payer: MEDICARE

## 2023-05-30 MED ORDER — OXYCODONE 10 MG TABLET
ORAL_TABLET | Freq: Three times a day (TID) | ORAL | 0 refills | 5 days | Status: CP | PRN
Start: 2023-05-30 — End: ?

## 2023-05-30 NOTE — Unmapped (Signed)
ORTHOPAEDIC NOTE     Heidi Benedict L. Jory Welke, PA-C        Heidi Blankenship    MRN: [id number]  DOB: 13-Jan-1946    Date of visit: 05/30/2023    Clinic location: [place]     ASSESSMENT:     Unchanged alignment of displaced left distal radius, lunate and ulnar styloid fracture reduced on 05/17/2023  Right sacral fracture sustained on 05/17/2023 with new onset of left groin pain after fall on 05/26/2023     PLAN:     She will continue on her short arm overwrap on her left  I discussed with patient that she is able to bear weight I think is unlikely that she has a fracture of her proximal femur and she may have a nondisplaced fracture of her pubic rami on the left but this would not change my treatment plan  She will continue using a cane for ambulation  Refill of oxycodone was provided for pain  -Advised OTC analgesic PRN pain  -Discussed treatment options and patient was amenable to the above plan and was instructed to call and be seen if there is any increasing pain or concerns.     Follow up: 1 week, 3 views left wrist in cast       Chief Complaint:     Recheck left wrist     SUBJECTIVE:     HPI: Heidi Blankenship is a RHD 77 y.o. with a PMHx ESRD on dialysis, osteoporosis, T2DM, thyroid disease presenting to Clinic for reevaluation of left distal radius, lunate and ulnar styloid fractures reduced on 05/17/2023.  She is been in a short arm cast.  She is fallen multiple times due to hypoglycemia since she has last seen me.  She is having increasing pain in her left wrist and also has pain in her sacrum as well as her left groin.  She is able to walk but now uses a cane on her right side.       Allergies  Allergies   Allergen Reactions    Diclofenac Sodium Diarrhea and Nausea And Vomiting    Latex Itching    Penicillins Rash    Percocet [Oxycodone-Acetaminophen] Nausea And Vomiting     Past Medical History  Past Medical History:   Diagnosis Date    Allergic 1953. Penicillin    Very bad rash    Arthritis Oct. 2020    Pain & swelling. hands    Basal cell carcinoma     Complication of anesthesia 2013    Sick afterward    Diabetes mellitus (CMS-HCC)     Disease of thyroid gland     ESRD (end stage renal disease) on dialysis (CMS-HCC)     History of nonmelanoma skin cancer 05/31/2015        PHYSICAL EXAM:     MSK: Left wrist in cast  Inspection: Mild swelling of the digits with associate ecchymosis  Palpation: No pain with passive stretch of the digits  ROM: Full range of motion of the digits  Strength: Intact pincer grasp strength  normal sensation LUE to exposed skin  Brisk Capillary refill Left thumb  Pelvis and bilateral lower extremities  No edema, no erythema, skin intact  Tenderness along the left sacrum and pubic rami as well as lumbar spine  Adequate symmetric strength bilateral lower extremities     Imaging   Four views of the left Wrist independently reviewed and interpreted by myself show unchanged alignment of comminuted impacted distal  radius fracture with lunate fracture and ulnar styloid fracture. No other obvious fractures, lucencies, dislocations, or acute abnormalities.  AP inlet outlet views of the wrist obtained in clinic today independently reviewed and interpreted by myself and I see no obvious fractures or lucencies but there is diffuse osseous demineralization with incompletely visualized lumbar DJD and postsurgical clips with vascular stents and vascular calcifications    MEDICAL DECISION MAKING (level of service defined by 2/3 elements)     Number/Complexity of Problems Addressed 1 acute, uncomplicated illness or injury (99203/99213)   Amount/Complexity of Data to be Reviewed/Analyzed Independent interpretation of a test performed by another physician/other qualified health care professional (99204/99214)   Risk of Complications/Morbidity/Mortality of Management Prescription Medication (99204/99214)   DME ORDER:  Dx:  ,                   cc:  Jaclyn Shaggy, MD  *Patient note was created using Dragon Dictation sotware. Errors in syntax or grammar may not have been identified and edited on initial review.

## 2023-06-04 ENCOUNTER — Other Ambulatory Visit (INDEPENDENT_AMBULATORY_CARE_PROVIDER_SITE_OTHER): Payer: Self-pay | Admitting: Nurse Practitioner

## 2023-06-04 MED ORDER — CLOPIDOGREL BISULFATE 75 MG PO TABS: 75.0000 mg | ORAL_TABLET | Freq: Every day | ORAL | 2 refills | Status: DC

## 2023-06-05 MED ORDER — TACROLIMUS 0.5 MG CAPSULE, IMMEDIATE-RELEASE
ORAL_CAPSULE | 6 refills | 0 days | Status: CP
Start: 2023-06-05 — End: ?

## 2023-06-05 MED ORDER — PREDNISONE 5 MG TABLET
ORAL_TABLET | Freq: Every day | ORAL | 11 refills | 30 days | Status: CP
Start: 2023-06-05 — End: ?

## 2023-06-07 ENCOUNTER — Ambulatory Visit: Admit: 2023-06-07 | Discharge: 2023-06-08 | Payer: MEDICARE

## 2023-06-07 NOTE — Unmapped (Signed)
ORTHOPAEDIC NOTE     Heidi Blankenship L. Heidi Utke, PA-C        Heidi Blankenship    MRN: [id number]  DOB: August 08, 1946    Date of visit: 06/07/2023    Clinic location: [place]     ASSESSMENT:     Unchanged alignment of left distal radius with lunate and ulnar styloid fracture reduced on 05/17/2023     PLAN:     New short arm cast was applied on the left upper extremity in clinic today  She understands that she will a cosmetic deformed but ideally this does not cause a functional deficit  She will work on range of motion exercise her fingers  -Advised OTC analgesic PRN pain  -Discussed treatment options and patient was amenable to the above plan and was instructed to call and be seen if there is any increasing pain or concerns.     Follow up: 3 weeks, 3 views left wrist out of cast, AP inlet outlet views of the pelvis       Chief Complaint:     Recheck left wrist     SUBJECTIVE:     HPI: Heidi Blankenship is a RHD 77 y.o. with a PMHx ESRD on dialysis, osteoporosis, T2DM, thyroid disease presenting to Clinic for reevaluation of left distal radius, lunate and ulnar styloid fracture reduced on 05/17/2023 with right sacral fracture on 05/17/2023.  She has been in a short arm overwrap on her left upper extremity weightbearing as tolerated on bilateral lower extremities using a cane for ambulation.  Overall she states that she is doing well but the cast does rub up against areas on her arm       Allergies  Allergies   Allergen Reactions    Diclofenac Sodium Diarrhea and Nausea And Vomiting    Latex Itching    Penicillins Rash    Percocet [Oxycodone-Acetaminophen] Nausea And Vomiting     Past Medical History  Past Medical History:   Diagnosis Date    Allergic 1953. Penicillin    Very bad rash    Arthritis Oct. 2020    Pain & swelling. hands    Basal cell carcinoma     Complication of anesthesia 2013    Sick afterward    Diabetes mellitus (CMS-HCC)     Disease of thyroid gland     ESRD (end stage renal disease) on dialysis (CMS-HCC)     History of nonmelanoma skin cancer 05/31/2015        PHYSICAL EXAM:     MSK: Left wrist  Inspection: No significant swelling of the digits, abrasion along the proximal forearm where the splint is rubbing  Palpation: No pain with passive range of motion of the digits  ROM: Full range of motion of the digits  Strength: Intact pincer grasp strength  normal sensation LUE to exposed skin  Brisk Capillary refill Left thumb       Imaging   Four views of the left Wrist independently reviewed and interpreted by myself show unchanged alignment of distal radius fracture with ulnar styloid fracture, lunate fracture is not as well-visualized. No other obvious fractures, lucencies, dislocations, or acute abnormalities.    MEDICAL DECISION MAKING (level of service defined by 2/3 elements)     Number/Complexity of Problems Addressed 1 acute, uncomplicated illness or injury (99203/99213)   Amount/Complexity of Data to be Reviewed/Analyzed Independent interpretation of a test performed by another physician/other qualified health care professional (99204/99214)   Risk of Complications/Morbidity/Mortality of Management  Over-the-counter Medications (99203/99213)   DME ORDER:  Dx:  ,                   cc:  Jaclyn Shaggy, MD  *Patient note was created using Dragon Dictation sotware. Errors in syntax or grammar may not have been identified and edited on initial review.

## 2023-06-07 NOTE — Unmapped (Addendum)
Work on range of motion exercises of your fingers  Occupational therapy will contact you to schedule an appointment to be seen the same day you see me on 06-26-23

## 2023-06-11 DIAGNOSIS — Z79899 Other long term (current) drug therapy: Principal | ICD-10-CM

## 2023-06-11 DIAGNOSIS — Z940 Kidney transplant status: Principal | ICD-10-CM

## 2023-06-18 MED ORDER — OXYCODONE 5 MG TABLET
ORAL_TABLET | ORAL | 0 refills | 7 days | Status: CP
Start: 2023-06-18 — End: ?

## 2023-06-21 NOTE — Unmapped (Signed)
[facility] Refill Coordination Note    Specialty Medication(s) to be Shipped:   Transplant: sirolimus 0.5mg     Other medication(s) to be shipped: No additional medications requested for fill at this time     Heidi Blankenship, DOB: 03-27-1946  Phone: There are no phone numbers on file.      All above HIPAA information was verified with patient.     Was a Nurse, learning disability used for this call? No    Completed refill call assessment today to schedule patient's medication shipment from the [facility] [phone]).  All relevant notes have been reviewed.     Specialty medication(s) and dose(s) confirmed: Regimen is correct and unchanged.   Changes to medications: Delainy reports no changes at this time.  Changes to insurance: No  New side effects reported not previously addressed with a pharmacist or physician: None reported  Questions for the pharmacist: No    Confirmed patient received a Conservation officer, historic buildings and a Surveyor, mining with first shipment. The patient will receive a drug information handout for each medication shipped and additional FDA Medication Guides as required.       DISEASE/MEDICATION-SPECIFIC INFORMATION        N/A    SPECIALTY MEDICATION ADHERENCE     Medication Adherence    Patient reported X missed doses in the last month: 0  Specialty Medication: sirolimus 0.5 mg tablet (RAPAMUNE)  Patient is on additional specialty medications: No  Patient is on more than two specialty medications: No  Any gaps in refill history greater than 2 weeks in the last 3 months: no  Demonstrates understanding of importance of adherence: yes  Informant: patient  Confirmed plan for next specialty medication refill: delivery by pharmacy  Refills needed for supportive medications: not needed          Refill Coordination    Has the Patients' Contact Information Changed: No  Is the Shipping Address Different: No         Were doses missed due to medication being on hold? No    sirolimus 0.5   mg: 6 days of medicine on hand       REFERRAL TO PHARMACIST     Referral to the pharmacist: Not needed      [facility]     Shipping address confirmed in Epic.       Delivery Scheduled: Yes, Expected medication delivery date: 06/27/2023.     Medication will be delivered via Next Day Courier to the prescription address in Epic WAM.    Kerby Less   [facility] Specialty Technician

## 2023-06-26 ENCOUNTER — Ambulatory Visit: Admit: 2023-06-26 | Discharge: 2023-06-27 | Payer: MEDICARE

## 2023-06-26 LAB — CBC W/ DIFFERENTIAL
BANDED NEUTROPHILS ABSOLUTE COUNT: 0.1 10*3/uL (ref 0.0–0.1)
BASOPHILS ABSOLUTE COUNT: 0 10*3/uL (ref 0.0–0.2)
BASOPHILS RELATIVE PERCENT: 0 %
EOSINOPHILS ABSOLUTE COUNT: 0.1 10*3/uL (ref 0.0–0.4)
EOSINOPHILS RELATIVE PERCENT: 1 %
HEMATOCRIT: 40.8 % (ref 34.0–46.6)
HEMOGLOBIN: 13.5 g/dL (ref 11.1–15.9)
IMMATURE GRANULOCYTES: 1 %
LYMPHOCYTES ABSOLUTE COUNT: 1.2 10*3/uL (ref 0.7–3.1)
LYMPHOCYTES RELATIVE PERCENT: 14 %
MEAN CORPUSCULAR HEMOGLOBIN CONC: 33.1 g/dL (ref 31.5–35.7)
MEAN CORPUSCULAR HEMOGLOBIN: 29.5 pg (ref 26.6–33.0)
MEAN CORPUSCULAR VOLUME: 89 fL (ref 79–97)
MONOCYTES ABSOLUTE COUNT: 0.9 10*3/uL (ref 0.1–0.9)
MONOCYTES RELATIVE PERCENT: 11 %
NEUTROPHILS ABSOLUTE COUNT: 6.1 10*3/uL (ref 1.4–7.0)
NEUTROPHILS RELATIVE PERCENT: 73 %
PLATELET COUNT: 570 10*3/uL — ABNORMAL HIGH (ref 150–450)
RED BLOOD CELL COUNT: 4.57 x10E6/uL (ref 3.77–5.28)
RED CELL DISTRIBUTION WIDTH: 14.5 % (ref 11.7–15.4)
WHITE BLOOD CELL COUNT: 8.4 10*3/uL (ref 3.4–10.8)

## 2023-06-26 LAB — COMPREHENSIVE METABOLIC PANEL
ALBUMIN: 3.7 g/dL — ABNORMAL LOW (ref 3.8–4.8)
ALKALINE PHOSPHATASE: 158 IU/L — ABNORMAL HIGH (ref 44–121)
ALT (SGPT): 15 IU/L (ref 0–32)
AST (SGOT): 18 IU/L (ref 0–40)
BILIRUBIN TOTAL (MG/DL) IN SER/PLAS: 0.5 mg/dL (ref 0.0–1.2)
BLOOD UREA NITROGEN: 24 mg/dL (ref 8–27)
BUN / CREAT RATIO: 30 — ABNORMAL HIGH (ref 12–28)
CALCIUM: 9.6 mg/dL (ref 8.7–10.3)
CHLORIDE: 101 mmol/L (ref 96–106)
CO2: 24 mmol/L (ref 20–29)
CREATININE: 0.81 mg/dL (ref 0.57–1.00)
EGFR: 75 mL/min/{1.73_m2}
GLOBULIN, TOTAL: 2.2 g/dL (ref 1.5–4.5)
GLUCOSE: 111 mg/dL — ABNORMAL HIGH (ref 70–99)
POTASSIUM: 5.3 mmol/L — ABNORMAL HIGH (ref 3.5–5.2)
SODIUM: 138 mmol/L (ref 134–144)
TOTAL PROTEIN: 5.9 g/dL — ABNORMAL LOW (ref 6.0–8.5)

## 2023-06-26 LAB — PHOSPHORUS: PHOSPHORUS, SERUM: 3.3 mg/dL (ref 3.0–4.3)

## 2023-06-26 LAB — MAGNESIUM: MAGNESIUM: 2 mg/dL (ref 1.6–2.3)

## 2023-06-26 MED ORDER — TRAMADOL 50 MG TABLET
ORAL_TABLET | Freq: Three times a day (TID) | ORAL | 0 refills | 5 days | Status: CP | PRN
Start: 2023-06-26 — End: ?

## 2023-06-26 MED FILL — SIROLIMUS 0.5 MG TABLET: ORAL | 30 days supply | Qty: 60 | Fill #5

## 2023-06-26 NOTE — Unmapped (Signed)
[facility]  [address] [place], [place] [address]    [phone]    Per [name], ATC, Heidi Blankenship does not wish to have therapy any more and she would like to cancel today's appointment as well as any future appointments. This Clinical research associate has cancelled today's appointment.      Thank you for this referral,     Signed: Bonnita Nasuti, OT  06/26/2023 2:41 PM

## 2023-06-26 NOTE — Unmapped (Signed)
ORTHOPAEDIC NOTE     Gwenneth Whiteman L. Camia Dipinto, PA-C        Heidi Blankenship    MRN: [id number]  DOB: [birth year]    Date of visit: 06/26/2023    Clinic location: [place]     ASSESSMENT:     Increased comminution and angulation of left intra-articular distal radius fracture with ulnar styloid fracture reduced on 05/17/2023  Continued pain associated with sacral fracture on 05/17/2023       PLAN:     Regarding the wrist she understands that her fracture is collapsed and she is at risk for stiffness and osteoarthrosis but she is not interested in surgical intervention  I have recommended range of motion exercises of the wrist and she declined therapy and thus home exercise program was provided  Prescription for tramadol was provided for pain  Removal wrist brace was provided to be worn for the next 2 to 4 weeks  Regarding her pelvis I think that her symptoms will resolve with time she has no red flag symptoms  -Advised OTC analgesic PRN pain  -Discussed treatment options and patient was amenable to the above plan and was instructed to call and be seen if there is any increasing pain or concerns.     Follow up: 6 weeks, 3 views right wrist, AP inlet outlet views of the pelvis       Chief Complaint:     Recheck left wrist and pelvis     SUBJECTIVE:     HPI: Heidi Blankenship is a RHD 77 y.o. with a PMHx ESRD on dialysis, osteoporosis, T2DM, thyroid disease presenting to Clinic for reevaluation of left distal radius, lunate and ulnar styloid fracture reduced on 05/17/2023 who has been in a short arm cast with right sacral fracture on 05/17/2023.  She has been working on range of motion exercises of her fingers and weightbearing as tolerated with an assistive device.  She states that the wrist does not cause her much problems but the pelvis is causing her problems without bowel or bladder changes.  Denies saddle anesthesia numbness or tingling to her legs.       Allergies  Allergies   Allergen Reactions    Diclofenac Sodium Diarrhea and Nausea And Vomiting    Latex Itching    Penicillins Rash    Percocet [Oxycodone-Acetaminophen] Nausea And Vomiting     Past Medical History  Past Medical History:   Diagnosis Date    Allergic 1953. Penicillin    Very bad rash    Arthritis Oct. 2020    Pain & swelling. hands    Basal cell carcinoma     Complication of anesthesia 2013    Sick afterward    Diabetes mellitus (CMS-HCC)     Disease of thyroid gland     ESRD (end stage renal disease) on dialysis (CMS-HCC)     History of nonmelanoma skin cancer 05/31/2015        PHYSICAL EXAM:     MSK: Left wrist  Inspection: Obvious deformity with skin tear along the dorsum of the wrist without obvious infection  Palpation: No tenderness on the distal radius  ROM: Wrist extension 40 wrist flexion 20  Strength: Intact pincer grasp strength  normal sensation LUE  radial pulses easily palpable  MSK: Pelvis and bilateral lower extremities  Inspection: No edema, no erythema, skin intact  Palpation: Tenderness along the coccyx and sacrum  ROM: Full range of motion bilateral lower extremities  Strength: Full symmetric strength  bilateral lower extremities  normal sensation RLE LLE  Dorsalis pedal pulses easily palpable      Imaging   Four views of the left Wrist independently reviewed and interpreted by myself show further collapse with displacement of the left intra-articular distal radius fracture. No other obvious fractures, lucencies, dislocations, or acute abnormalities.  Three views of the pelvis AP, Inlet, Outlet independently reviewed and interpreted by myself show known sacral fracture is not well-visualized. No other obvious fractures, lucencies, dislocations, or acute abnormalities.    MEDICAL DECISION MAKING (level of service defined by 2/3 elements)     Number/Complexity of Problems Addressed 1 acute, uncomplicated illness or injury (99203/99213)   Amount/Complexity of Data to be Reviewed/Analyzed Independent interpretation of a test performed by another physician/other qualified health care professional (99204/99214)   Risk of Complications/Morbidity/Mortality of Management Prescription Medication (99204/99214)   DME ORDER:  Dx: S62.102D, Closed fracture of left wrist with routine healing, subsequent encounter  Dispense at Surgery: No  Location: [facility]  Body Location: Hand/Wrist/Elbow  Hand/Wrist/Elbow Orthotics: Wrist Cock-Up- 8  Laterality: Left  Print for Patient: No  Weightbearing: Full  Special Instructions: room 2422    DME Upper Extremity,  , The patient was prescribed this orthosis to be used on the upper extremity for the purpose of reducing pain and providing support and protection.           cc:  Jaclyn Shaggy, MD  *Patient note was created using Dragon Dictation sotware. Errors in syntax or grammar may not have been identified and edited on initial review.

## 2023-06-26 NOTE — Unmapped (Signed)
Wrist Rehab Exercises  Your Care Instructions  Here are some examples of typical rehabilitation exercises for your condition. Start each exercise slowly. Ease off the exercise if you start to have pain.  Your doctor or your physical or occupational therapist will tell you when you can start these exercises and which ones will work best for you.    Perform exercises 3 times daily  How to do the exercises  Wrist flexion and extension    Place your forearm on a table, with your hand and affected wrist extended beyond the table, palm down.  Bend your wrist to move your hand upward and allow your hand to close into a fist, then lower your hand and allow your fingers to relax. Hold each position for about 6 seconds.  Repeat 8 to 12 times.  Hand flips    While seated, place your forearm and affected wrist on your thigh, palm down.  Flip your hand over so the back of your hand rests on your thigh and your palm is up. Alternate between palm up and palm down while keeping your forearm on your thigh.  Repeat 8 to 12 times.  Wrist radial and ulnar deviation    Hold your affected hand out in front of you, palm down.  Slowly bend your wrist as far as you can from side to side. Hold each position for about 6 seconds.  Repeat 8 to 12 times.  Wrist extensor stretch    Extend the arm with the affected wrist in front of you and point your fingers toward the floor.  With your other hand, gently bend your wrist farther until you feel a mild to moderate stretch in your forearm.  Hold the stretch for at least 15 to 30 seconds.  Repeat 2 to 4 times.  When you can do this stretch with ease and no pain, repeat steps 1 through 4. But this time extend your affected arm in front of you and make a fist with your palm facing down. Then bend your wrist, pointing your fist toward the floor.  Wrist flexor stretch    Extend the arm with the affected wrist in front of you with your palm facing away from your body.  Bend back your wrist, pointing your hand up toward the ceiling.  With your other hand, gently bend your wrist farther until you feel a mild to moderate stretch in your forearm.  Hold the stretch for at least 15 to 30 seconds.  Repeat 2 to 4 times.  Repeat steps 1 through 5, but this time extend your affected arm in front of you with your palm facing up. Then bend back your wrist, pointing your hand toward the floor.  How to do the exercises  Tendon glides    In this exercise, the steps follow one another to a make a continuous movement.  Hold your hand upward. Your fingers and thumb will be pointing straight up. Your wrist should be relaxed, following the line of your fingers and thumb.  Curl your fingers so that the top two joints in them are bent, and your fingers wrap down. Your fingertips should touch or be near the base of your fingers. Your fingers will look like a hook.  Make a fist by bending your knuckles. Your thumb can gently rest against your index (pointing) finger.  Unwind your fingers slightly so that your fingertips can touch the base of your palm. Your thumb can rest against your index finger.  Return  to your starting position, with your fingers and thumb pointing up.  Repeat the series of motions 8 to 12 times.  It's a good idea to repeat these steps with your other hand.  Thumb flexion and extension    Place your affected hand on a table with your thumb pointing up.  Bend your thumb downward and across your palm. Your thumb should touch the base of your little finger.  Hold for about 6 seconds. Then straighten your thumb.  Repeat 8 to 12 times.  It's a good idea to repeat these steps with your other hand.  Thumb adduction and abduction    With your affected hand, point your fingers and thumb straight up. Your wrist should be relaxed and straight.  Move your thumb away from your palm as far as you can. Hold for about 6 seconds. Then move your thumb back to the starting position, with your thumb resting against your index (pointing) finger.  Repeat 8 to 12 times.  It's a good idea to repeat these steps with your other hand.  Finger-thumb opposition    With one hand, point your fingers and thumb straight up. Your wrist should be relaxed, following the line of your fingers and thumb.  Touch your thumb to each finger, one finger at a time. This will look like an okay sign. Try to make the circle as round as you can. And try to keep your other fingers as straight as you can.  Repeat 8 to 12 times with each hand.

## 2023-06-27 LAB — CMV DNA, QUANTITATIVE, PCR: CMV QUANT: NEGATIVE [IU]/mL

## 2023-06-27 LAB — BK VIRUS QUANTITATIVE PCR, BLOOD: BK VIRUS DNA, QN PCR: NEGATIVE [IU]/mL

## 2023-06-27 LAB — TACROLIMUS LEVEL: TACROLIMUS BLOOD: 6.5 ng/mL (ref 2.0–20.0)

## 2023-07-02 DIAGNOSIS — Z940 Kidney transplant status: Principal | ICD-10-CM

## 2023-07-02 DIAGNOSIS — Z79899 Other long term (current) drug therapy: Principal | ICD-10-CM

## 2023-07-09 DIAGNOSIS — Z79899 Other long term (current) drug therapy: Principal | ICD-10-CM

## 2023-07-09 DIAGNOSIS — Z940 Kidney transplant status: Principal | ICD-10-CM

## 2023-07-17 NOTE — Unmapped (Signed)
[facility] Refill Coordination Note    Specialty Medication(s) to be Shipped:   Transplant: sirolimus 0.5mg     Other medication(s) to be shipped: No additional medications requested for fill at this time     Heidi Blankenship, DOB: 1946-05-24  Phone: There are no phone numbers on file.      All above HIPAA information was verified with patient.     Was a Nurse, learning disability used for this call? No    Completed refill call assessment today to schedule patient's medication shipment from the [facility] [phone]).  All relevant notes have been reviewed.     Specialty medication(s) and dose(s) confirmed: Regimen is correct and unchanged.   Changes to medications: Salima reports no changes at this time.  Changes to insurance: No  New side effects reported not previously addressed with a pharmacist or physician: None reported  Questions for the pharmacist: No    Confirmed patient received a Conservation officer, historic buildings and a Surveyor, mining with first shipment. The patient will receive a drug information handout for each medication shipped and additional FDA Medication Guides as required.       DISEASE/MEDICATION-SPECIFIC INFORMATION        N/A    SPECIALTY MEDICATION ADHERENCE     Medication Adherence    Patient reported X missed doses in the last month: 0  Specialty Medication: Sirolimus 0.5mg   Patient is on additional specialty medications: No              Were doses missed due to medication being on hold? No    Sirolimus 0.5 mg: 10 days of medicine on hand     REFERRAL TO PHARMACIST     Referral to the pharmacist: Not needed      [facility]     Shipping address confirmed in Epic.       Delivery Scheduled: Yes, Expected medication delivery date: 07/26/23.     Medication will be delivered via Next Day Courier to the prescription address in Epic WAM.    Tera Helper, [facility]   [facility] Specialty Pharmacist

## 2023-07-25 MED FILL — SIROLIMUS 0.5 MG TABLET: ORAL | 30 days supply | Qty: 60 | Fill #6

## 2023-07-30 DIAGNOSIS — Z79899 Other long term (current) drug therapy: Principal | ICD-10-CM

## 2023-07-30 DIAGNOSIS — Z940 Kidney transplant status: Principal | ICD-10-CM

## 2023-08-06 DIAGNOSIS — Z79899 Other long term (current) drug therapy: Principal | ICD-10-CM

## 2023-08-06 DIAGNOSIS — Z940 Kidney transplant status: Principal | ICD-10-CM

## 2023-08-09 ENCOUNTER — Ambulatory Visit: Admit: 2023-08-09 | Discharge: 2023-08-09 | Payer: MEDICARE

## 2023-08-09 NOTE — Unmapped (Signed)
ORTHOPAEDIC NOTE     [name], PA-C        Heidi Blankenship    MRN: [id number]  DOB: 09/07/1946    Date of visit: 08/09/2023    Clinic location: [place]     ASSESSMENT:     Malunion of left distal radius fracture sustained on 04/27/2023  Asymptomatic right sacral fracture sustained on 05/17/2023 with improving pain associated with suspected left sacral insufficiency fracture  Lumbar DJD     PLAN:     Again patient understands that she will have a cosmetic deformity of her wrist and is at risk for stiffness and arthritis and she is a candidate for surgical intervention but wishes to continue nonoperative treatment  She may resume normal walking activities, if back pain persist she could consider formal physical therapy referral to the spine center but at this stage she declined  -Advised OTC analgesic PRN pain  -Discussed treatment options and patient was amenable to the above plan and was instructed to call and be seen if there is any increasing pain or concerns.     Follow up: As needed per her request       Chief Complaint:     Recheck left wrist and pelvis     SUBJECTIVE:     HPI: Heidi Blankenship is a RHD 77 y.o. with a PMHx ESRD on dialysis, osteoporosis, T2DM, thyroid disease presenting to Clinic for reevaluation of left distal radius, lunate and ulnar styloid fracture reduced on 05/17/2023 with right sacral fracture sustained at the same time.  She continues to use a wrist brace but states her pain is much better and her arm and only has some mild stiffness.  She did hit her right wrist up against a bookcase a couple weeks ago but does not think it is broken.  She states she has no pain referable to her right sacrum but does have some pain referable to her left sacrum and also of her low back without bowel or bladder changes.       Allergies  Allergies   Allergen Reactions    Diclofenac Sodium Diarrhea and Nausea And Vomiting    Latex Itching    Penicillins Rash    Percocet [Oxycodone-Acetaminophen] Nausea And Vomiting     Past Medical History  Past Medical History:   Diagnosis Date    Allergic 1953. Penicillin    Very bad rash    Arthritis Oct. 2020    Pain & swelling. hands    Basal cell carcinoma     Complication of anesthesia 2013    Sick afterward    Diabetes mellitus (CMS-HCC)     Disease of thyroid gland     ESRD (end stage renal disease) on dialysis (CMS-HCC)     History of nonmelanoma skin cancer 05/31/2015        PHYSICAL EXAM:     MSK: Left wrist  Inspection: Obvious cosmetic deformity without erythema or ecchymosis, skin intact  Palpation: No tenderness by palpate along the distal radius or ulna  ROM: Wrist extension 55, wrist flexion 40  Strength: Intact pincer strength  normal sensation LUE  radial pulses easily palpable  MSK: Pelvis  Inspection: No edema, no erythema, skin intact  Palpation: Mild general diffuse tenderness throughout the lumbar spine, minimal tenderness on the left sacrum  ROM: Full range of motion bilateral hips  Strength: Adequate symmetric strength bilateral lower extremities  normal sensation RLE LLE  Dorsalis pedal pulses easily palpable  Imaging   Four views of the left Wrist independently reviewed and interpreted by myself show increasing callus summation of comminuted intra-articular displaced left distal radius fracture with fragmentation of the lunate. No other obvious fractures, lucencies, dislocations, or acute abnormalities.  Three views of the pelvis AP, Inlet, Outlet independently reviewed and interpreted by myself show sclerosis along the right sacrum and what appears to be sclerosis along the left pubic symphysis with vascular calcifications and incompletely visualized lumbar DJD. No other obvious fractures, lucencies, dislocations, or acute abnormalities.    MEDICAL DECISION MAKING (level of service defined by 2/3 elements)     Number/Complexity of Problems Addressed 1 acute, uncomplicated illness or injury (99203/99213)   Amount/Complexity of Data to be Reviewed/Analyzed Independent interpretation of a test performed by another physician/other qualified health care professional (99204/99214)   Risk of Complications/Morbidity/Mortality of Management Over-the-counter Medications (99203/99213)   DME ORDER:  Dx:  ,                   cc:  Jaclyn Shaggy, MD  *Patient note was created using Dragon Dictation sotware. Errors in syntax or grammar may not have been identified and edited on initial review.

## 2023-08-15 ENCOUNTER — Other Ambulatory Visit: Payer: Self-pay

## 2023-08-15 ENCOUNTER — Emergency Department: Payer: Medicare HMO

## 2023-08-15 ENCOUNTER — Emergency Department
Admission: EM | Admit: 2023-08-15 | Discharge: 2023-08-15 | Disposition: A | Discharge status: Home / Self Care | Payer: Medicare HMO | Attending: Emergency Medicine | Admitting: Emergency Medicine

## 2023-08-15 DIAGNOSIS — Z992 Dependence on renal dialysis: Secondary | ICD-10-CM | POA: Insufficient documentation

## 2023-08-15 DIAGNOSIS — Z87891 Personal history of nicotine dependence: Secondary | ICD-10-CM | POA: Insufficient documentation

## 2023-08-15 DIAGNOSIS — E11649 Type 2 diabetes mellitus with hypoglycemia without coma: Secondary | ICD-10-CM | POA: Insufficient documentation

## 2023-08-15 DIAGNOSIS — I120 Hypertensive chronic kidney disease with stage 5 chronic kidney disease or end stage renal disease: Secondary | ICD-10-CM | POA: Diagnosis not present

## 2023-08-15 DIAGNOSIS — E1169 Type 2 diabetes mellitus with other specified complication: Secondary | ICD-10-CM | POA: Insufficient documentation

## 2023-08-15 DIAGNOSIS — E039 Hypothyroidism, unspecified: Secondary | ICD-10-CM | POA: Insufficient documentation

## 2023-08-15 DIAGNOSIS — E785 Hyperlipidemia, unspecified: Secondary | ICD-10-CM | POA: Diagnosis not present

## 2023-08-15 DIAGNOSIS — E162 Hypoglycemia, unspecified: Secondary | ICD-10-CM

## 2023-08-15 DIAGNOSIS — N186 End stage renal disease: Secondary | ICD-10-CM | POA: Insufficient documentation

## 2023-08-15 DIAGNOSIS — Z940 Kidney transplant status: Secondary | ICD-10-CM | POA: Insufficient documentation

## 2023-08-15 DIAGNOSIS — M25552 Pain in left hip: Secondary | ICD-10-CM | POA: Diagnosis not present

## 2023-08-15 DIAGNOSIS — E1122 Type 2 diabetes mellitus with diabetic chronic kidney disease: Secondary | ICD-10-CM | POA: Diagnosis not present

## 2023-08-15 LAB — BASIC METABOLIC PANEL
Anion gap: 10 (ref 5–15)
BUN: 25 mg/dL — ABNORMAL HIGH (ref 8–23)
CO2: 23 mmol/L (ref 22–32)
Calcium: 9.4 mg/dL (ref 8.9–10.3)
Chloride: 104 mmol/L (ref 98–111)
Creatinine, Ser: 0.99 mg/dL (ref 0.44–1.00)
GFR, Estimated: 59 mL/min — ABNORMAL LOW (ref >60)
Glucose, Bld: 37 mg/dL — CL (ref 70–99)
Potassium: 4 mmol/L (ref 3.5–5.1)
Sodium: 137 mmol/L (ref 135–145)

## 2023-08-15 LAB — CBC WITH DIFFERENTIAL/PLATELET
Abs Immature Granulocytes: 0.16 10*3/uL — ABNORMAL HIGH (ref 0.00–0.07)
Basophils Absolute: 0 10*3/uL (ref 0.0–0.1)
Basophils Relative: 0 %
Eosinophils Absolute: 0 10*3/uL (ref 0.0–0.5)
Eosinophils Relative: 0 %
HCT: 45 % (ref 36.0–46.0)
Hemoglobin: 14.4 g/dL (ref 12.0–15.0)
Immature Granulocytes: 1 %
Lymphocytes Relative: 8 %
Lymphs Abs: 1.1 10*3/uL (ref 0.7–4.0)
MCH: 29.6 pg (ref 26.0–34.0)
MCHC: 32 g/dL (ref 30.0–36.0)
MCV: 92.4 fL (ref 80.0–100.0)
Monocytes Absolute: 0.9 10*3/uL (ref 0.1–1.0)
Monocytes Relative: 6 %
Neutro Abs: 11.9 10*3/uL — ABNORMAL HIGH (ref 1.7–7.7)
Neutrophils Relative %: 85 %
Platelets: 753 10*3/uL — ABNORMAL HIGH (ref 150–400)
RBC: 4.87 MIL/uL (ref 3.87–5.11)
RDW: 17.6 % — ABNORMAL HIGH (ref 11.5–15.5)
WBC: 14.1 10*3/uL — ABNORMAL HIGH (ref 4.0–10.5)
nRBC: 0 % (ref 0.0–0.2)

## 2023-08-15 LAB — CBG MONITORING, ED
Glucose-Capillary: 36 mg/dL — CL (ref 70–99)
Glucose-Capillary: 54 mg/dL — ABNORMAL LOW (ref 70–99)
Glucose-Capillary: 90 mg/dL (ref 70–99)

## 2023-08-15 MED ORDER — LIDOCAINE 5 % EX PTCH
1.0000 | MEDICATED_PATCH | CUTANEOUS | Status: DC
  Administered 2023-08-15: 1 via TRANSDERMAL
  Filled 2023-08-15: qty 1

## 2023-08-15 MED ORDER — GLUCOSE 40 % PO GEL
1.0000 | Freq: Once | ORAL | Status: AC
  Administered 2023-08-15: 31 g via ORAL
  Filled 2023-08-15: qty 1

## 2023-08-15 MED ORDER — TRAMADOL HCL 50 MG PO TABS: 50.0000 mg | ORAL_TABLET | Freq: Two times a day (BID) | ORAL | 0 refills | Status: AC | PRN

## 2023-08-15 NOTE — ED Notes (Signed)
PA @bedside , Pt is AxOx4, Pt given a juice, crackers, peanut butter and a ginger ale and encouraged to eat.Will recheck BGL shortly

## 2023-08-15 NOTE — ED Provider Notes (Signed)
[facility] Provider Note    Event Date/Time   First MD Initiated Contact with Patient 08/15/23 1236     (approximate)   History   Leg Pain   HPI  Cindy Fischer is a 77 y.o. female with past medical history of peripheral artery disease with stents in bilateral lower extremities, diabetes, hyperlipidemia, end-stage renal disease who presents today for evaluation of left hip pain.  Patient reports that this has been ongoing for the past week.  She reports that she is able to bear weight, though has pain with weightbearing.  She has not had any fevers or chills.  She denies any radiation of her pain.  She denies back pain.  She denies paresthesias or weakness.  No recent injuries.  Patient Active Problem List   Diagnosis Date Noted   Syncope and collapse 05/13/2021   PAC (premature atrial contraction) 05/13/2021   PAD (peripheral artery disease) (HCC) 05/13/2021   Hyperlipidemia associated with type 2 diabetes mellitus (HCC) 05/13/2021   ESRD (end stage renal disease) (HCC) 05/13/2021   Subdural hematoma (HCC) 04/16/2021   Compression fracture of thoracolumbar vertebra (HCC) 04/16/2021   Multiple rib fractures 04/16/2021   Nephrolithiasis 04/16/2021   Backache 01/20/2021   Edema 01/20/2021   Essential hypertension 01/20/2021   Lumbar spondylosis with myelopathy 06/10/2020   Lymphedema 03/22/2020   GERD (gastroesophageal reflux disease) 02/09/2019   Atherosclerosis of native arteries of extremity with intermittent claudication (HCC) 08/17/2018   S/P kidney transplant 08/17/2018   Diabetes (HCC) 08/17/2018   Hyperlipidemia 08/17/2018   Numbness and tingling 07/16/2018   Tingling 07/16/2018   Erythrocytosis 09/24/2015   Thrombocytosis 09/24/2015   Breast swelling 08/26/2015   Immunosuppression (HCC) 07/22/2015   History of nonmelanoma skin cancer 05/31/2015   Aftercare following organ transplant 11/26/2014   Hypothyroidism 11/26/2014    Immunocompromised state (HCC) 11/26/2014   Quit smoking within past year 11/26/2014   Proliferative diabetic retinopathy (HCC) 04/10/2011          Physical Exam   Triage Vital Signs: ED Triage Vitals  Encounter Vitals Group     BP 08/15/23 1209 (!) 107/55     Systolic BP Percentile --      Diastolic BP Percentile --      Pulse Rate 08/15/23 1209 80     Resp 08/15/23 1209 16     Temp 08/15/23 1209 98.4 F (36.9 C)     Temp Source 08/15/23 1209 Oral     SpO2 08/15/23 1209 96 %     Weight 08/15/23 1210 142 lb (64.4 kg)     Height 08/15/23 1210 5\' 4"  (1.626 m)     Head Circumference --      Peak Flow --      Pain Score 08/15/23 1210 8     Pain Loc --      Pain Education --      Exclude from Growth Chart --     Most recent vital signs: Vitals:   08/15/23 1209  BP: (!) 107/55  Pulse: 80  Resp: 16  Temp: 98.4 F (36.9 C)  SpO2: 96%    Physical Exam Vitals and nursing note reviewed.  Constitutional:      General: Awake and alert. No acute distress.    Appearance: Normal appearance. The patient is normal weight.  HENT:     Head: Normocephalic and atraumatic.     Mouth: Mucous membranes are moist.  Eyes:     General: PERRL. Normal  EOMs        Right eye: No discharge.        Left eye: No discharge.     Conjunctiva/sclera: Conjunctivae normal.  Cardiovascular:     Rate and Rhythm: Normal rate and regular rhythm.     Pulses: Normal pulses.  Pulmonary:     Effort: Pulmonary effort is normal. No respiratory distress.     Breath sounds: Normal breath sounds.  Abdominal:     Abdomen is soft. There is no abdominal tenderness. No rebound or guarding. No distention. Musculoskeletal:        General: No swelling. Normal range of motion.     Cervical back: Normal range of motion and neck supple.  Pelvis stable.  Negative logroll of the hip bilaterally.  Patient is able to actively lift both legs up off of the bed, though has pain with lifting her left leg up.  There are  no skin changes.  She has normal distal pulses, normal femoral pulse.  Feet are warm and well-perfused and equal bilaterally.  Sensation is intact light touch throughout.  Legs are equal in circumference bilaterally.  There are no skin changes noted.  There is no tenderness to the buttock. Back: No midline tenderness. Strength and sensation 5/5 to bilateral lower extremities. Normal great toe extension against resistance. Normal sensation throughout feet. Normal patellar reflexes. Negative SLR and opposite SLR bilaterally. Negative FABER test Skin:    General: Skin is warm and dry.     Capillary Refill: Capillary refill takes less than 2 seconds.     Findings: No rash.  Neurological:     Mental Status: The patient is awake and alert.      ED Results / Procedures / Treatments   Labs (all labs ordered are listed, but only abnormal results are displayed) Labs Reviewed  CBC WITH DIFFERENTIAL/PLATELET - Abnormal; Notable for the following components:      Result Value   WBC 14.1 (*)    RDW 17.6 (*)    Platelets 753 (*)    Neutro Abs 11.9 (*)    Abs Immature Granulocytes 0.16 (*)    All other components within normal limits  BASIC METABOLIC PANEL - Abnormal; Notable for the following components:   Glucose, Bld 37 (*)    BUN 25 (*)    GFR, Estimated 59 (*)    All other components within normal limits  CBG MONITORING, ED - Abnormal; Notable for the following components:   Glucose-Capillary 36 (*)    All other components within normal limits  CBG MONITORING, ED - Abnormal; Notable for the following components:   Glucose-Capillary 54 (*)    All other components within normal limits     EKG     RADIOLOGY I independently reviewed and interpreted imaging and agree with radiologists findings.     PROCEDURES:  Critical Care performed:   Procedures   MEDICATIONS ORDERED IN ED: Medications  lidocaine (LIDODERM) 5 % 1 patch (1 patch Transdermal Patch Applied 08/15/23 1447)   dextrose (GLUTOSE) oral gel 40% (31 g Oral Given 08/15/23 1422)     IMPRESSION / MDM / ASSESSMENT AND PLAN / ED COURSE  I reviewed the triage vital signs and the nursing notes.   Differential diagnosis includes, but is not limited to, osteoarthritis, stent occlusion, lumbar radiculopathy.  Patient is awake and alert, hemodynamically stable and afebrile.  She has palpable pulses that are equal bilaterally, and feet are warm and well-perfused bilaterally, I do not think this  is a vascular occlusion.  Her legs are equal in circumference bilaterally, no history of clotting, no tenderness throughout the leg, do not suspect DVT.  There are no skin changes or constitutional symptoms to suggest infection.  She is able to lift both of her legs up off of the stretcher, though has pain with moving her left hip.  X-ray was obtained for the possibility of osteoarthritis.  Labs reveal leukocytosis to 14.  She is on daily prednisone which may be contributing to this.  She most recently had her blood drawn 2 months ago and had a leukocytosis to 15 in the absence of any infectious symptoms.  She was also found to be hypoglycemic, though she remains awake and alert.  She admits to taking her long-acting insulin this morning and did not eat anything.  She reports that she normally has jellybeans in her purse, but has run out of them so she was unable to take these.  She was given crackers and juice and also oral glucose.  X-ray reveals degenerative changes of her hip which may be contributing to her symptoms.  We did discuss recommendation for CT scan for further evaluation given that she is immunocompromised to ensure that we are not missing more occult etiology of her symptoms today, however she declines at this time and prefers to come back for any new, worsening, or change in symptoms.  We discussed very strict return precautions.  She requested a prescription for tramadol which has been helpful for her in the past.   I am reluctant to give her NSAIDs given her kidney transplant.  She reports that she has been taking Tylenol with limited improvement.  She was given a prescription for tramadol though reminded that this medication is highly addictive and should only be used for breakthrough pain.  She was also advised that she should not drive, operate heavy machinery, or perform any task that require concentration while taking this medication.  She was given the information for orthopedics to follow-up, though she reports that she has an orthopedist that she prefers to see instead and I advised her that this is completely fine.  We discussed at length return precautions for signs of infection, vascular abnormality, or any other new, worsening, or.  She understands and agrees with plan.  She was discharged in stable condition.  Patient's presentation is most consistent with acute complicated illness / injury requiring diagnostic workup.   Clinical Course as of 08/15/23 1458  Wed Aug 15, 2023  1345 Patient is hypoglycemic. Will give juice [JP]  1421 Patient reports that she took her long-acting insulin and did not eat anything today.  She reports that normally she has jellybeans in her purse but she did not have any today.  She reports that she feels well. Dextrose ordered [JP]    Clinical Course User Index [JP] Tessie Ordaz, Herb Grays, PA-C     FINAL CLINICAL IMPRESSION(S) / ED DIAGNOSES   Final diagnoses:  Left hip pain  Hypoglycemia     Rx / DC Orders   ED Discharge Orders          Ordered    traMADol (ULTRAM) 50 MG tablet  Every 12 hours PRN        08/15/23 1431             Note:  This document was prepared using Dragon voice recognition software and may include unintentional dictation errors.   Keturah Shavers 08/15/23 1458    Paduchowski,  Caryn Bee, MD 08/15/23 1525

## 2023-08-15 NOTE — ED Triage Notes (Signed)
Pt sts that she is having left leg groin pain. Pt sts that she has a stent placed there years ago and is wondering if that is why she is in pain.

## 2023-08-15 NOTE — ED Notes (Signed)
Lab called with critical lab. Glucose is 37. Provider informed.

## 2023-08-15 NOTE — Discharge Instructions (Addendum)
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  Your feet are warm and appear to be well-perfused, I do not think this is a stent problem.  You were found to have a very low blood sugar and you were given sugar to help bring this back up.  Please remember not to take your long-acting insulin without eating.  Please follow-up with orthopedics, you may choose to follow-up with your own personal orthopedist if you prefer or you may follow-up with [facility] clinic with the information provided.  May take the tramadol as needed for pain, but remember that this medication is addictive and may increase your fall risk, so please only take in the presence of someone else.  Also, do not drive, operate heavy machinery, or perform any test that require concentration while taking this medication.  Please return if you develop any new, worsening, or change in symptoms or other concerns as we would obtain a CT scan and further workup as we discussed.  It was a pleasure caring for you today.

## 2023-08-16 NOTE — Unmapped (Signed)
[facility] Refill Coordination Note    Specialty Medication(s) to be Shipped:   Transplant: sirolimus 0.5 mg    Other medication(s) to be shipped: No additional medications requested for fill at this time     Heidi Blankenship, DOB: [birth year]  Phone: There are no phone numbers on file.      All above HIPAA information was verified with patient.     Was a Nurse, learning disability used for this call? No    Completed refill call assessment today to schedule patient's medication shipment from the [facility] [phone]).  All relevant notes have been reviewed.     Specialty medication(s) and dose(s) confirmed: Regimen is correct and unchanged.   Changes to medications: Heidi Blankenship reports no changes at this time.  Changes to insurance: No  New side effects reported not previously addressed with a pharmacist or physician: None reported  Questions for the pharmacist: No    Confirmed patient received a Conservation officer, historic buildings and a Surveyor, mining with first shipment. The patient will receive a drug information handout for each medication shipped and additional FDA Medication Guides as required.       DISEASE/MEDICATION-SPECIFIC INFORMATION        N/A    SPECIALTY MEDICATION ADHERENCE     Medication Adherence    Patient reported X missed doses in the last month: 0  Specialty Medication: sirolimus 0.5 mg tablet (RAPAMUNE)  Patient is on additional specialty medications: No              Were doses missed due to medication being on hold? No    sirolimus 0.5 mg: 10 days of medicine on hand        REFERRAL TO PHARMACIST     Referral to the pharmacist: Not needed      [facility]     Shipping address confirmed in Epic.       Delivery Scheduled: Yes, Expected medication delivery date: 08/24/23.     Medication will be delivered via UPS to the prescription address in Epic WAM.    Heidi Blankenship   [facility] Specialty Technician

## 2023-08-22 ENCOUNTER — Ambulatory Visit: Admit: 2023-08-22 | Payer: MEDICARE

## 2023-08-22 ENCOUNTER — Ambulatory Visit
Admit: 2023-08-22 | Discharge: 2023-08-30 | Disposition: A | Discharge status: Skilled Nursing Facility | Payer: MEDICARE | Admitting: Adult Health

## 2023-08-22 LAB — CBC W/ AUTO DIFF
BASOPHILS ABSOLUTE COUNT: 0 10*9/L (ref 0.0–0.1)
BASOPHILS RELATIVE PERCENT: 0.4 %
EOSINOPHILS ABSOLUTE COUNT: 0 10*9/L (ref 0.0–0.5)
EOSINOPHILS RELATIVE PERCENT: 0.3 %
HEMATOCRIT: 43.3 % (ref 34.0–44.0)
HEMOGLOBIN: 14.2 g/dL (ref 11.3–14.9)
LYMPHOCYTES ABSOLUTE COUNT: 0.9 10*9/L — ABNORMAL LOW (ref 1.1–3.6)
LYMPHOCYTES RELATIVE PERCENT: 6.8 %
MEAN CORPUSCULAR HEMOGLOBIN CONC: 32.8 g/dL (ref 32.0–36.0)
MEAN CORPUSCULAR HEMOGLOBIN: 30.2 pg (ref 25.9–32.4)
MEAN CORPUSCULAR VOLUME: 92 fL (ref 77.6–95.7)
MONOCYTES ABSOLUTE COUNT: 0.7 10*9/L (ref 0.3–0.8)
MONOCYTES RELATIVE PERCENT: 4.9 %
NEUTROPHILS ABSOLUTE COUNT: 11.7 10*9/L — ABNORMAL HIGH (ref 1.8–7.8)
NEUTROPHILS RELATIVE PERCENT: 87.6 %
PLATELET COUNT: >494 10*9/L — ABNORMAL HIGH (ref 150–450)
RED BLOOD CELL COUNT: 4.7 10*12/L (ref 3.95–5.13)
RED CELL DISTRIBUTION WIDTH: 17.8 % — ABNORMAL HIGH (ref 12.2–15.2)
WBC ADJUSTED: 13.4 10*9/L — ABNORMAL HIGH (ref 3.6–11.2)

## 2023-08-22 LAB — COMPREHENSIVE METABOLIC PANEL
ALBUMIN: 3 g/dL — ABNORMAL LOW (ref 3.4–5.0)
ALKALINE PHOSPHATASE: 104 U/L (ref 46–116)
ALT (SGPT): 10 U/L (ref 10–49)
ANION GAP: 6 mmol/L (ref 5–14)
BILIRUBIN TOTAL: 0.8 mg/dL (ref 0.3–1.2)
BLOOD UREA NITROGEN: 15 mg/dL (ref 9–23)
BUN / CREAT RATIO: 18
CALCIUM: 9.3 mg/dL (ref 8.7–10.4)
CHLORIDE: 107 mmol/L (ref 98–107)
CO2: 24 mmol/L (ref 20.0–31.0)
CREATININE: 0.85 mg/dL
EGFR CKD-EPI (2021) FEMALE: 71 mL/min/{1.73_m2} (ref >=60)
GLUCOSE RANDOM: 145 mg/dL (ref 70–179)
PROTEIN TOTAL: 5.8 g/dL (ref 5.7–8.2)
SODIUM: 137 mmol/L (ref 135–145)

## 2023-08-22 LAB — PROTIME-INR
INR: 0.95
PROTIME: 10.7 s (ref 9.9–12.6)

## 2023-08-22 LAB — MAGNESIUM: MAGNESIUM: 1.9 mg/dL (ref 1.6–2.6)

## 2023-08-22 MED ADMIN — lidocaine (ASPERCREME) 4 % 2 patch: 2 | TRANSDERMAL | @ 20:00:00 | Stop: 2023-08-22

## 2023-08-22 NOTE — Unmapped (Signed)
ED Progress Note    [facility]  Emergency Department Attestation Note        ED Clinical Impression       Diagnosis ICD-10-CM Associated Orders   1. Closed fracture of multiple pubic rami, left, initial encounter (CMS-HCC)  S32.592A       2. Closed fracture of sacrum, unspecified portion of sacrum, initial encounter (CMS-HCC)  S32.10XA               ED Attending Physician Teaching Attestation      I supervised care provided by the resident. We have discussed the case, I have reviewed the note and I agree with the plan of treatment except as documented in my note.    I have personally performed a face-to-face diagnostic evaluation on this patient.    Procedure performed: none      ED Attending Note      Outside Historian(s)    Friend    Vitals:    08/24/23 1530 08/24/23 2308 08/25/23 0541 08/25/23 0845   BP: 113/53 121/59 126/50 117/47   Pulse: 82 72 69 69   Resp: 16 18 17 16    Temp: 37.1 ??C (98.8 ??F) 35.9 ??C (96.7 ??F) 36.8 ??C (98.2 ??F) 36.7 ??C (98.1 ??F)   TempSrc: Oral Oral Oral Oral   SpO2: 94% 95% 94% 96%   Weight:       Height:           Heidi Blankenship is a 77 y.o. female  hx of PAD w/ bilat stents in 2019, fall in 2024 w/ distal radial and sacral ilia fx, last 10 days with worsening pain to left leg and unable to ambulate. Has been seen multiple times at OSH with only plain films for evaluation. No fevers. No nausea or vomiting. No unilateral leg swelling.     Impression, Medical Decision Making and Plan of Care    Fracture vs dislocation vs muscular strain. Given decrease in mobility and functional change will likely require PT/OT eval if imaging does not show any acute findings.     Additional MDM Elements                          I have reviewed the patient's vital signs and the nursing notes. Any pertinent labs & imaging results which were available during my care of the patient were reviewed by me. See chart, resident and nursing documentation for additional ED course details.    Portions of this record have been created using Scientist, clinical (histocompatibility and immunogenetics). Dictation errors have been sought, but may not have been identified and corrected.

## 2023-08-22 NOTE — Unmapped (Signed)
[facility]  Emergency Department Provider Note     ED Clinical Impression     Final diagnoses:   None      Impression, Medical Decision Making, ED Course     Impression: 77 y.o. female who has a past medical history of Allergic (1953. Penicillin), Arthritis (Oct. 2020), Basal cell carcinoma, Complication of anesthesia (2013), Diabetes mellitus (CMS-HCC), Disease of thyroid gland, ESRD (end stage renal disease) on dialysis (CMS-HCC), and History of nonmelanoma skin cancer (05/31/2015). who presents with left groin and leg pain as described below.     DDx/MDM: 77 y.o. history significant for peripheral vascular disease status post PTA stent with right SFA and popliteal in 2019, recent mechanical fall in May 2024 with a sacral alla fracture and left wrist fracture, diabetes, ESRD status post renal transplant, presenting for evaluation today of left hip and groin pain.  The pain has been progressive since recent fall of May 2024, but is significantly worsened over the last several days.  Patient has multiple plain films, with 1 questionable possible fracture, will proceed with MRI pelvis to evaluate for occult fracture/traumatic injury.  Although patient has reassuring dopplerable DP/PT pulses, with warm extremities bilaterally, certainly the patient is at increased risk for stenosis/obstruction of the bilateral iliac stents, will obtain PVL arterial Dopplers to evaluate for stent patency.  Will provide pain control reassess.    Diagnostic workup as below.     Orders Placed This Encounter   Procedures    MRI Pelvis Wo Contrast MSK    Extra Lab Tubes    CBC w/ Differential    Comprehensive Metabolic Panel    Magnesium    PT-INR    Potassium Level    AST    Measure post void residual     ED Course as of 08/22/23 2238   Wed Aug 22, 2023   1237 8/21 Hip XR left  IMPRESSION:   Osteopenia with degenerative changes.     Vascular calcifications with surgical changes including bilateral   common iliac stents      1252 5/23 CT lumbar  FINDINGS:      Diffuse demineralization limits evaluation.    Transverse contaminate and right-sided mild broad-based dextroscoliosis apex T8.    Lumbar spine: Prior laminectomy at L3-L4. Age-indeterminate nondisplaced fracture at the right transverse process of L2. (3:26). Prominent Schmorl's node at superior endplate of L1 and associated height loss, age indeterminate. Multilevel degenerative changes greatest at L2-L3, L3-L4 and L5-S1. Stepwise retrolisthesis from L2-L4. Mild focal dextroscoliosis at L3. Vertebral body heights are maintained. Nondisplaced.    Soft tissues: Paraspinal muscle atrophy. Dependent soft tissue edema.    Other: Heavy coronary artery calcifications. Small hiatal hernia. Atherosclerosis of the abdominal aorta and branch vessels. Renal atrophy.       1848 Spoke to PVL, the patient will be taken for the study shortly.   2054 Signed out to oncoming physician at 2100.  Patient pending results of PVL arterial duplex as well as MRI prior to disposition.     MDM Elements  Discussion of Management with other Physicians, QHP or Appropriate Source: None  Independent Interpretation of Studies: Pending at time of signout  I have reviewed recent and relevant previous record, including: See below for review of recent records  ____________________________________________    The case was discussed with the attending physician, who is in agreement with the above assessment and plan.      History     Chief Complaint  Chief Complaint  Patient presents with    Leg Pain     HPI   Heidi Blankenship is a 77 y.o. female with past medical history as below who presents with left groin and upper leg pain/hip pain.  Reports a fall in May 2024, mechanical fall with the patient fell backwards landing on her bottom.  Was seen at the emergency department and diagnosed with a left wrist fracture, multiple rib fractures as well as a sacral fracture.  Reports following up with orthopedics for all of these injuries, was cleared by orthopedics at [facility] on August 15th.  Reports seen at [facility] emergency department on August 21 for significant left-sided groin pain as well as upper leg/hip pain.  Reports that the pain is progressing each day, and over the last 3 days has not been able to walk secondary to the pain.  Patient denies any recent spinal ablation but does report a spinal surgery to the lumbar spine in 2021, denies any recent steroid use/injections, no IV drug use, no weakness or numbness or tingling in the lower extremities, no additional traumatic injury, no fevers, no urinary/bowel incontinence/retention, no anesthesia.  Patient denies chest pain, shortness of breath, abdominal pain, nausea, [place], diarrhea, urinary symptoms, fever, viral symptoms.  Patient normally ambulates with a walker.     Reviewed the patient's recent imaging from the fall on 05/17/2023. See below.   CT thoracic/lumbar spine 05/17/23:  Impression    --Nondisplaced right sacral fracture with transverse component and cortical irregular component.   --Age-indeterminate displaced fracture of the right transverse process multilevel degenerative changes in the lumbar spine without acute appearing compression deformity.   -Age-indeterminate but chronic appearing L1 compression deformity with an associated large superior endplate Schmorl's node.   --No acute fracture of the thoracic spine.   --Additional chronic and incidental findings as detailed in the report body.     Reviewed repeat imaging, 06/26/2023, x-ray pelvis 3 views  Impression   Fracture of the sacrum seen on previous CT scan is not seen at this time. No other evidence for fracture.     Reviewed repeat imaging, 08/09/2023 x-ray pelvis 3 views  Impression   Developing left pubis insufficiency fracture.   Sclerosis in the left sacral ala consistent with a healing sacrum fracture.     Noted imaging from 08/15/2023 x-ray hip 2 views left  FINDINGS:   Osteopenia. No fracture or dislocation. Mild degenerative changes   along the sacroiliac joints with sclerosis and osteophytes. Grossly   preserved hip joints. Scattered vascular calcifications identified.   Bilateral iliac stents are identified.     Outside Historian(s): I have obtained additional history/collateral from family at bedside.    Past Medical History:   Diagnosis Date    Allergic 1953. Penicillin    Very bad rash    Arthritis Oct. 2020    Pain & swelling. hands    Basal cell carcinoma     Complication of anesthesia 2013    Sick afterward    Diabetes mellitus (CMS-HCC)     Disease of thyroid gland     ESRD (end stage renal disease) on dialysis (CMS-HCC)     History of nonmelanoma skin cancer 05/31/2015       Past Surgical History:   Procedure Laterality Date    PR LIGATN ANGIOACCESS AV FISTULA Right 05/11/2015    Procedure: LIGATION OR BANDING OF ANGIOACCESS ARTERIOVENOUS FISTULA;  Surgeon: Purcell Mouton, MD;  Location: MAIN OR [facility];  Service: Transplant    PR  TRANSPLANT,PREP CADAVER RENAL GRAFT N/A 10/20/2014    Procedure: [facility] STD PREP CAD DONR RENAL ALLOGFT PRIOR TO TRNSPLNT, INCL DISSEC/REM PERINEPH FAT, DIAPH/RTPER ATTAC;  Surgeon: Purcell Mouton, MD;  Location: MAIN OR [place];  Service: Transplant    PR TRANSPLANTATION OF KIDNEY N/A 10/20/2014    Procedure: RENAL ALLOTRANSPLANTATION, IMPLANTATION OF GRAFT; WITHOUT RECIPIENT NEPHRECTOMY;  Surgeon: Purcell Mouton, MD;  Location: MAIN OR [place];  Service: Transplant    SKIN BIOPSY      TONSILLECTOMY  1953    TUBAL LIGATION  1985     No current facility-administered medications for this encounter.    Current Outpatient Medications:     acetaminophen (TYLENOL 8 HOUR) 650 MG CR tablet, Take 2 tablets (1,300 mg total) by mouth., Disp: , Rfl:     aspirin (ECOTRIN) 81 MG tablet, Take 1 tablet (81 mg total) by mouth daily., Disp: , Rfl:     atorvastatin (LIPITOR) 20 MG tablet, Take 1 tablet (20 mg total) by mouth daily., Disp: 30 tablet, Rfl: 11    cholecalciferol, vitamin D3, (VITAMIN D3) 2,000 unit Tab, Take 1 tablet by mouth daily., Disp: 90 each, Rfl: 5    clopidogreL (PLAVIX) 75 mg tablet, Take 1 tablet (75 mg total) by mouth daily., Disp: , Rfl:     levothyroxine (SYNTHROID) 100 MCG tablet, TAKE 1 TABLET BY MOUTH EVERY DAY (Patient taking differently: Take 1 tablet (100 mcg total) by mouth. Taking 100 mcg daily x 6 days/week.), Disp: 90 tablet, Rfl: 11    lisinopril (PRINIVIL,ZESTRIL) 10 MG tablet, Take 1 tablet (10 mg total) by mouth daily., Disp: 90 tablet, Rfl: 3    NOVOLIN N NPH U-100 INSULIN 100 unit/mL injection, INJECT 30 UNITS EVERY MORNING AND 6 UNITS EVERY EVENING, Disp: , Rfl:     NOVOLIN R REGULAR U100 INSULIN 100 unit/mL injection, INJECT 10 UNITS WITH BREAKFAST, 5 UNITS WITH LUNCH AND 5 UNITS WITH DINNER, Disp: , Rfl:     omeprazole (PRILOSEC) 40 MG capsule, Take 1 capsule (40 mg total) by mouth every other day., Disp: , Rfl:     oxyCODONE (ROXICODONE) 5 MG immediate release tablet, Take 1 tablet (5 mg total) by mouth daily., Disp: 7 tablet, Rfl: 0    predniSONE (DELTASONE) 5 MG tablet, Take 1 tablet (5 mg total) by mouth daily., Disp: 30 tablet, Rfl: 11    sirolimus (RAPAMUNE) 0.5 mg tablet, Take 2 tablets (1 mg total) by mouth in the morning., Disp: 180 tablet, Rfl: 3    tacrolimus (PROGRAF) 0.5 MG capsule, TAKE 3 CAPSULES (1.5 MG) IN THE MORNING AND 2 CAPSULES (1 MG) IN THE EVENING, Disp: 150 capsule, Rfl: 6    torsemide (DEMADEX) 20 MG tablet, Take 1 tablet (20 mg total) by mouth daily as needed., Disp: , Rfl:     traMADol (ULTRAM) 50 mg tablet, Take 1 tablet (50 mg total) by mouth every eight (8) hours as needed for pain., Disp: 15 tablet, Rfl: 0    Allergies  Diclofenac sodium, Latex, Penicillins, and Percocet [oxycodone-acetaminophen]    Family History  Family History   Problem Relation Age of Onset    Multiple myeloma Mother     Diabetes Father     Diabetes Sister     Melanoma Neg Hx     Basal cell carcinoma Neg Hx     Squamous cell carcinoma Neg Hx Social History  Social History     Tobacco Use    Smoking status: Former  Current packs/day: 0.00     Average packs/day: 0.5 packs/day for 35.0 years (17.5 ttl pk-yrs)     Types: Cigarettes     Start date: 10/21/1979     Quit date: 10/20/2014     Years since quitting: 8.8    Smokeless tobacco: Never    Tobacco comments:     Off and on use   Vaping Use    Vaping status: Never Used   Substance Use Topics    Alcohol use: Not Currently    Drug use: Never        Physical Exam     VITAL SIGNS:      Vitals:    08/22/23 1211 08/22/23 1300 08/22/23 1604 08/22/23 2207   BP: 124/63 125/56 135/66 154/65   Pulse: 81 71 70 72   Resp: 18  18 18    Temp: 37.3 ??C (99.1 ??F)  36.9 ??C (98.4 ??F) 36.8 ??C (98.2 ??F)   TempSrc: Oral  Oral Oral   SpO2:  98%  98%   Weight:       Height:         Constitutional: Alert and oriented. No acute distress.  Eyes: Conjunctivae are normal.  HEENT: Normocephalic and atraumatic. Conjunctivae clear. No congestion. Moist mucous membranes.   Cardiovascular: Rate as above, regular rhythm. Normal and symmetric distal pulses. Brisk capillary refill. Normal skin turgor.  Respiratory: Normal respiratory effort. Breath sounds are normal. There are no wheezing or crackles heard.  Gastrointestinal: Soft, non-distended, non-tender.  Genitourinary: Deferred.  Musculoskeletal: Left proximal thigh tenderness to palpation with no obvious vomiting, left groin pain with tenderness to palpation.  Dopplerable DP/PT pulses bilaterally, lower extremities are warm with equal capillary refill.  Neurologic: Normal speech and language. No gross focal neurologic deficits are appreciated. Patient is moving all extremities equally, face is symmetric at rest and with speech.   Skin: Skin is warm, dry and intact. No rash noted.  Psychiatric: Mood and affect are normal. Speech and behavior are normal.     Radiology     PVL Arterial Duplex Lower Extremity Bilateral Limited         MRI Pelvis Wo Contrast MSK   Preliminary Result -Insufficiency fractures of the bilateral sacral wings and sacral promontory as well as the left pubic tubercle with robust marrow edema suggesting an acute to subacute process.      -Minimally displaced fracture of the left superior pubic ramus which appears acute to subacute.           Pertinent labs & imaging results that were available during my care of the patient were independently interpreted by me and considered in my medical decision making (see chart for details).    Portions of this record have been created using Scientist, clinical (histocompatibility and immunogenetics). Dictation errors have been sought, but may not have been identified and corrected.     Cheryle Horsfall, MD  Resident  08/22/23 [phone]

## 2023-08-22 NOTE — Unmapped (Signed)
Patient here with left leg pain x 3 days. Patient says that she is unable to stand or walk on her own.

## 2023-08-22 NOTE — Unmapped (Addendum)
Pt states I have a left leg that is not working and it has gotten progressively worse since the 15 th.      Pt states it is painful from groin to mid thigh.  Pt denies swelling.    Pt states she cannot walk nor stan but was able to shuffle from in the house with her walker and arrived to the ER POV.

## 2023-08-23 LAB — AST: AST (SGOT): 17 U/L (ref <=34)

## 2023-08-23 LAB — HEMOGLOBIN A1C
ESTIMATED AVERAGE GLUCOSE: 103 mg/dL
HEMOGLOBIN A1C: 5.2 % (ref 4.8–5.6)

## 2023-08-23 LAB — POTASSIUM: POTASSIUM: 4.2 mmol/L (ref 3.4–4.8)

## 2023-08-23 MED ADMIN — clopidogrel (PLAVIX) tablet 75 mg: 75 mg | ORAL | @ 14:00:00

## 2023-08-23 MED ADMIN — pantoprazole (Protonix) EC tablet 40 mg: 40 mg | ORAL | @ 14:00:00

## 2023-08-23 MED ADMIN — tacrolimus (PROGRAF) capsule 1.5 mg: 1.5 mg | ORAL | @ 14:00:00

## 2023-08-23 MED ADMIN — predniSONE (DELTASONE) tablet 5 mg: 5 mg | ORAL | @ 14:00:00

## 2023-08-23 MED ADMIN — acetaminophen (TYLENOL) tablet 1,000 mg: 1000 mg | ORAL

## 2023-08-23 MED ADMIN — oxyCODONE (ROXICODONE) immediate release tablet 5 mg: 5 mg | ORAL | Stop: 2023-09-06

## 2023-08-23 MED ADMIN — levothyroxine (SYNTHROID) tablet 100 mcg: 100 ug | ORAL | @ 14:00:00

## 2023-08-23 MED ADMIN — tacrolimus (PROGRAF) capsule 1 mg: 1 mg | ORAL

## 2023-08-23 MED ADMIN — lisinopril (PRINIVIL,ZESTRIL) tablet 10 mg: 10 mg | ORAL | @ 14:00:00

## 2023-08-23 MED ADMIN — sirolimus (RAPAMUNE) tablet 1 mg: 1 mg | ORAL | @ 14:00:00

## 2023-08-23 MED FILL — SIROLIMUS 0.5 MG TABLET: ORAL | 30 days supply | Qty: 60 | Fill #7

## 2023-08-23 NOTE — Unmapped (Signed)
[facility] Medicine   History and Physical       Assessment and Plan     Heidi Blankenship is a 77 y.o. female who is presenting to [facility] with Multiple closed fractures of pelvis without disruption of pelvic ring with delayed healing, in the setting of the following pertinent/contributing co-morbidities: History of kidney transplant, DM.      Multiple closed fractures of pelvis without disruption of pelvic ring with delayed healing   Patient with fall in May now with worsening pain over months with difficulty functioning at home over the last 10 days.  MRI shows insufficiency fractures of the bilateral sacral ala as well as the left pubic tubercle and superior and inferior pubic rami.  Has been seen by orthopedics.  Patient can be weightbearing as tolerated and no operative repair is recommended at this time.  In light of her worsening pain and difficulty functioning, will admit to observation for pain control and mobilization.  Was taking occasional oxycodone at home as well as using topical lidocaine.  In light of prolonged nature of her pain, may need to consider adjunctive agents as well.  -Admit observation  -PT/OT evaluation  -Scheduled acetaminophen and lidocaine topical  -Oxycodone as needed for pain  -Consider other adjunctive medications if pain poorly controlled    Secondary/Additional Active Problems:  Kidney transplant/immunosuppression:  Renal function appears near baseline.  Follows with Dr. Margaretmary Bayley at [facility] clinic.  On sirolimus, tacrolimus and daily prednisone.  -Will alert renal transplant of admission  -Continue current immunosuppression    Type II diabetes mellitus (CMS-HCC)  Uses an unusual regimen at home with NPH only once daily at 25 to 28 units.  Also uses regular insulin once to twice daily but only in the afternoon and evening.  Typically uses 7 to 10 units at 3 PM and then may repeat this based on sugars and intake at 6 PM.  She has found that deviations from this regimen have led to hypoglycemia or significant elevations.  -Check HbA1c  -Will continue NPH 25 units in the morning  -Will not give standing regular at this time  -Will give corrective scale and monitor sugars    HTN:  On low-dose lisinopril.  -Will continue    PAD/lymphedema:  History of right sided arterial stents as well as bilateral iliac stents.  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  Also has a RUE swelling of unclear etiology which has been more chronic.  -Continue clopidogrel    Prophylaxis  -enoxaparin    Diet  -Nutrition Therapy Regular/House    Code Status / HCDM  -FULL, Discussed with patient at the time of admission   -  HCDM (patient stated preference): [name] - Sister - [phone]    HCDM (patient stated preference): Merton Border - Sister - [phone]    Anticipated Medically Ready for Discharge: Anticipated Tomorrow    Significant Comorbid Conditions:     -Age related debility POA requiring additional resources: DME, PT, or OT    Issues Impacting Complexity of Management:  -The patient is at high risk from Hospital immobility in an elderly patient given baseline poor functional status with a high risk of causing delirium and further decline in function      I personally spent greater than 75 minutes face-to-face and non-face-to-face in the care of this patient, which includes all pre, intra, and post visit time on the date of service.  All documented time was specific to the  E/M visit and does not include any procedures that may have been performed.    HPI      Heidi Blankenship is a 77 y.o. female who is presenting to [facility] with Multiple closed fractures of pelvis without disruption of pelvic ring with delayed healing.    History obtained from patient at bedside.  Was previously ambulatory independently and without assistive devices.  In May had a fall in her kitchen after slipping on some water.  Landed on her left side in her wrist.  Suffered immediate wrist pain, also noted some pain in her hip.  She was able to bear weight and ambulate after the injury.  She presented to the ER and was diagnosed with a sacral and wrist fracture.  The wrist was reduced and splinted.  She has since followed up with orthopedic service several times.  Over this period she has noted increasing pain in the left hip.  She initially required minimal oxycodone but has been taking it increasingly.  2 weeks ago she purchased a walker because she was having difficulty ambulating.  Over the last 10 days the pain has become significantly worse, including an episode where she was unable to get up off the toilet secondary to pain and had to crawl on the floor to get out of the bathroom.  She presented to ER for further evaluation.  MRI showed acute to subacute pelvic fractures.  Was seen in consultation by orthopedics.    She denies bowel or bladder symptoms.  She does report intermittent diarrhea which is preceded her injury for which she takes occasional Imodium. No history of issues with constipation even with pain medications.    She has been in general good health.  She denies any limiting dyspnea, chest pain.  She has had a good appetite.  She has a history of DM.  She reports taking NPH insulin once daily 25 to 28 units depending on her sugar levels.  She also uses regular insulin typically from 7-10 units at 3 PM in the afternoon when her sugar is high as.  She may do a second dose around 6 PM if her sugars remain elevated or her intake is high.  She reports that if she does not follow this specific regimen she is prone to low sugars.      Med Rec Confidence   I reviewed the Medication List. The current list is Accurate    Physical Exam   Temp:  [36.5 ??C (97.7 ??F)-36.8 ??C (98.2 ??F)] 36.6 ??C (97.8 ??F)  Heart Rate:  [69-75] 69  SpO2 Pulse:  [64] 64  Resp:  [17-18] 18  BP: (142-175)/(54-65) 142/54  SpO2:  [94 %-99 %] 99 %  Body mass index is 26.05 kg/m??.    GEN: Pleasant, sitting up in bed  EYES: EOMI, pupils equal, round, reactive to light  ENT: MMM, OP notable for no lesions  CV: JVP <6 cm, RRR, no murmurs appreciated  PULM: clear, good air entry  ABD: soft, normal bowel sounds, no masses, no HSM  EXT: RUE edema in the elbow and RLE edema 1-2+ to the knee, good pulses.  NEURO: No focal deficits  PSYCH: A+Ox3, appropriate  GU: No CVA tenderness  MSK: No spinal tenderness          ___________________________________________________________________    Medications     Prior to Admission medications    Medication Dose, Route, Frequency   atorvastatin (LIPITOR) 20 MG tablet 20 mg, Oral, Daily (standard)  cholecalciferol, vitamin D3, (VITAMIN D3) 2,000 unit Tab 1 tablet, Oral, Daily (standard)   clopidogreL (PLAVIX) 75 mg tablet 75 mg, Oral, Daily (standard)   levothyroxine (SYNTHROID) 100 MCG tablet TAKE 1 TABLET BY MOUTH EVERY DAY  Patient taking differently: Take 1 tablet (100 mcg total) by mouth. Taking 100 mcg daily x 6 days/week.   lisinopril (PRINIVIL,ZESTRIL) 10 MG tablet 10 mg, Oral, Daily (standard)   LUTEIN ORAL 1 tablet, Oral, Daily (standard)   NOVOLIN N NPH U-100 INSULIN 100 unit/mL injection 0.25-0.28 mL (25-28 Units total) daily. Takes 25 to 28 units in am depending on sugar   NOVOLIN R REGULAR U100 INSULIN 100 unit/mL injection Inject 0.07-0.1 mL (7-10 Units total) under the skin two (2) times a day. Takes 3 pm and again near 6 pm if needed   omeprazole (PRILOSEC) 40 MG capsule 40 mg, Oral, Every other day   phytonadione, vit K1, (VITAMIN K ORAL) 1 tablet, Oral, Daily   predniSONE (DELTASONE) 5 MG tablet 5 mg, Oral, Daily (standard)   sirolimus (RAPAMUNE) 0.5 mg tablet 1 mg, Oral, Daily   tacrolimus (PROGRAF) 0.5 MG capsule TAKE 3 CAPSULES (1.5 MG) IN THE MORNING AND 2 CAPSULES (1 MG) IN THE EVENING   ZINC ORAL 1 tablet, Oral, Daily (standard)   acetaminophen (TYLENOL 8 HOUR) 650 MG CR tablet 1,300 mg, Oral   lidocaine (LIDODERM) 5 % patch 1 patch, Transdermal, Every 24 hours, Apply to affected area for 12 hours only each day (then remove patch)       Allergies   Diclofenac sodium, Latex, Penicillins, and Percocet [oxycodone-acetaminophen]     Medical History     Past Medical History:   Diagnosis Date    Arthritis Oct. 2020    Pain & swelling. 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)     History of nonmelanoma skin cancer 05/31/2015    Kidney transplant recipient     Lymphedema     RUL and RLL    PAD (peripheral artery disease) (CMS-HCC)     Bilateral iliac stents, right femoral and popliteal stents    SDH (subdural hematoma) (CMS-HCC)     After fall 2022, did not require surgery       Social History   Tobacco use:   reports that she quit smoking about 8 years ago. Her smoking use included cigarettes. She started smoking about 43 years ago. She has a 17.5 pack-year smoking history. She has never used smokeless tobacco.  Alcohol use:   reports that she does not currently use alcohol.  Drug use:  reports no history of drug use.    Family History     Family History   Problem Relation Age of Onset    Multiple myeloma Mother     Diabetes Father     Diabetes Sister     Melanoma Neg Hx     Basal cell carcinoma Neg Hx     Squamous cell carcinoma Neg Hx        Surgical History     Past Surgical History:   Procedure Laterality Date    PR LIGATN ANGIOACCESS AV FISTULA Right 05/11/2015    Procedure: LIGATION OR BANDING OF ANGIOACCESS ARTERIOVENOUS FISTULA;  Surgeon: Rolan Bucco  Norma Fredrickson, MD;  Location: MAIN OR [facility];  Service: Transplant    PR TRANSPLANT,PREP CADAVER RENAL GRAFT N/A 10/20/2014    Procedure: [facility] STD PREP CAD DONR RENAL ALLOGFT PRIOR TO TRNSPLNT, INCL DISSEC/REM PERINEPH FAT, DIAPH/RTPER ATTAC;  Surgeon: Purcell Mouton, MD;  Location: MAIN OR [place];  Service: Transplant    PR TRANSPLANTATION OF KIDNEY N/A 10/20/2014    Procedure: RENAL ALLOTRANSPLANTATION, IMPLANTATION OF GRAFT; WITHOUT RECIPIENT NEPHRECTOMY;  Surgeon: Purcell Mouton, MD;  Location: MAIN OR [place];  Service: Transplant    SKIN BIOPSY      TONSILLECTOMY  1953    TUBAL LIGATION  1985

## 2023-08-23 NOTE — Unmapped (Signed)
ORTHOPAEDIC CONSULT  - Primary Service for this Patient: Emergency Medicine.  Patient is seen in consultation at the request of [facility] ED for evaluation of the following:          ASSESSMENT AND PLAN:  Heidi Blankenship is a 77 y.o. female with PMHx of DM, ESRD s/p renal transplant, skin cancer, PVD w/ right stents 2019 whom uses a walker/cane to ambulate since a GLF in 04/2023. Orthopedic surgery was consulted for:     1) Left LC1 pelvis fx  2) Left Sacral insufficiency fx 04/27/23  - Initial fall on 04/27/23 with no reported new trauma or fall, inabilty to ambulate and increase in pain since 8/24   - Closed, NVI  - Recommend post mobilization x-rays  - operative treatment is not anticipated  - Weight Bearing Status/Activity: weightbearing as tolerated on the right lower extremity left lower exremity.  - Recommended Additional Labs: No new labs needed.  - Pain control: per primary service and per ED.  - Tobacco use: None  - Best contact number: There are no phone numbers on file.   - Follow-up plan: SRO will arrange appropriate outpatient follow up     This patient discussed with senior on call resident, consult will be staffed with on-call attending.    * Please contact the resident who leaves daily progress notes for any questions while patient is inpatient during weekdays.  * Please page orthopaedic consult pager [phone]) on nights (after 5PM) and weekends.    PROCEDURE(S)   none    SUBJECTIVE     Reason for Consult/Chief Complaint:  left groin pain    History of Present Illness:   (E.g., location, quality, severity, timing, duration, context, modifying factors, associated symptoms; relevant review of systems)  Heidi Blankenship is a 77 y.o.  female with relevant PMH as below who presents after a mechanical fall 04/27/23 w/ sacral insufficiency fx. Also found to have a L DR and lunate fx w/ malunion. Last seen by Arlester Marker on 8/15 for outpatient follow up. Patient has been having an increase in pain since this Saturday. Denies any no known new fall or trauma. Inability to ambulate, been in bed since Sunday. Patient has crawled to her bathroom one time. Her sister finally was able to take her to the ED today. She was previously on oxycodone and tramadol prescribed from [facility] but has been out of medication for awhile now. Denies any right hip pain. Denies any bowel or bladder incontinence. Patient notes she cannot walk due to pain. No new numbness or tingling.      Medical History   Past Medical History:   Diagnosis Date    Allergic 1953. Penicillin    Very bad rash    Arthritis Oct. 2020    Pain & swelling. hands    Basal cell carcinoma     Complication of anesthesia 2013    Sick afterward    Diabetes mellitus (CMS-HCC)     Disease of thyroid gland     ESRD (end stage renal disease) on dialysis (CMS-HCC)     History of nonmelanoma skin cancer 05/31/2015      Surgical History   Past Surgical History:   Procedure Laterality Date    PR LIGATN ANGIOACCESS AV FISTULA Right 05/11/2015    Procedure: LIGATION OR BANDING OF ANGIOACCESS ARTERIOVENOUS FISTULA;  Surgeon: Purcell Mouton, MD;  Location: MAIN OR [place];  Service: Transplant    PR TRANSPLANT,PREP CADAVER RENAL GRAFT N/A 10/20/2014  Procedure: BACKBNCH STD PREP CAD DONR RENAL ALLOGFT PRIOR TO TRNSPLNT, INCL DISSEC/REM PERINEPH FAT, DIAPH/RTPER ATTAC;  Surgeon: Purcell Mouton, MD;  Location: MAIN OR [place];  Service: Transplant    PR TRANSPLANTATION OF KIDNEY N/A 10/20/2014    Procedure: RENAL ALLOTRANSPLANTATION, IMPLANTATION OF GRAFT; WITHOUT RECIPIENT NEPHRECTOMY;  Surgeon: Purcell Mouton, MD;  Location: MAIN OR [place];  Service: Transplant    SKIN BIOPSY      TONSILLECTOMY  1953    TUBAL LIGATION  1985      Medications   No current facility-administered medications for this encounter.     Current Outpatient Medications   Medication Sig Dispense Refill    acetaminophen (TYLENOL 8 HOUR) 650 MG CR tablet Take 2 tablets (1,300 mg total) by mouth.      aspirin (ECOTRIN) 81 MG tablet Take 1 tablet (81 mg total) by mouth daily.      atorvastatin (LIPITOR) 20 MG tablet Take 1 tablet (20 mg total) by mouth daily. 30 tablet 11    cholecalciferol, vitamin D3, (VITAMIN D3) 2,000 unit Tab Take 1 tablet by mouth daily. 90 each 5    clopidogreL (PLAVIX) 75 mg tablet Take 1 tablet (75 mg total) by mouth daily.      levothyroxine (SYNTHROID) 100 MCG tablet TAKE 1 TABLET BY MOUTH EVERY DAY (Patient taking differently: Take 1 tablet (100 mcg total) by mouth. Taking 100 mcg daily x 6 days/week.) 90 tablet 11    lisinopril (PRINIVIL,ZESTRIL) 10 MG tablet Take 1 tablet (10 mg total) by mouth daily. 90 tablet 3    NOVOLIN N NPH U-100 INSULIN 100 unit/mL injection INJECT 30 UNITS EVERY MORNING AND 6 UNITS EVERY EVENING      NOVOLIN R REGULAR U100 INSULIN 100 unit/mL injection INJECT 10 UNITS WITH BREAKFAST, 5 UNITS WITH LUNCH AND 5 UNITS WITH DINNER      omeprazole (PRILOSEC) 40 MG capsule Take 1 capsule (40 mg total) by mouth every other day.      oxyCODONE (ROXICODONE) 5 MG immediate release tablet Take 1 tablet (5 mg total) by mouth daily. 7 tablet 0    predniSONE (DELTASONE) 5 MG tablet Take 1 tablet (5 mg total) by mouth daily. 30 tablet 11    sirolimus (RAPAMUNE) 0.5 mg tablet Take 2 tablets (1 mg total) by mouth in the morning. 180 tablet 3    tacrolimus (PROGRAF) 0.5 MG capsule TAKE 3 CAPSULES (1.5 MG) IN THE MORNING AND 2 CAPSULES (1 MG) IN THE EVENING 150 capsule 6    torsemide (DEMADEX) 20 MG tablet Take 1 tablet (20 mg total) by mouth daily as needed.      traMADol (ULTRAM) 50 mg tablet Take 1 tablet (50 mg total) by mouth every eight (8) hours as needed for pain. 15 tablet 0      Allergies   Diclofenac sodium, Latex, Penicillins, and Percocet [oxycodone-acetaminophen]     Social History Employment: retired  Tobacco use:   Social History     Tobacco Use   Smoking Status Former    Current packs/day: 0.00    Average packs/day: 0.5 packs/day for 35.0 years (17.5 ttl pk-yrs)    Types: Cigarettes    Start date: 10/21/1979    Quit date: 10/20/2014    Years since quitting: 8.8   Smokeless Tobacco Never   Tobacco Comments    Off and on use   .  Social History     Social History Narrative    Not on file  Family History   Family History   Problem Relation Age of Onset    Multiple myeloma Mother     Diabetes Father     Diabetes Sister     Melanoma Neg Hx     Basal cell carcinoma Neg Hx     Squamous cell carcinoma Neg Hx            OBJECTIVE     PHYSICAL EXAM   Constitutional   Vitals  Estimated body mass index is 26.05 kg/m?? as calculated from the following:    Height as of this encounter: 157.5 cm (5' 2).    Weight as of this encounter: 64.6 kg (142 lb 6.4 oz).   Vitals:    08/23/23 0100   BP: 175/65   Pulse: 75   Resp: 18   Temp:    SpO2: 98%        General appearance  well-nourished, no acute distress   Musculoskeletal (relevant only; consider injured extremity + contralateral for comparison; consider all extremities + pelvis for high energy trauma)   Extremities:  RUE: No swelling, ecchymoses, deformity, or effusion. Skin intact. No tenting, impending open fracture. Nontender to palpation, with full and painless ROM throughout. + Motor in Axillary, AIN, PIN, Ulnar distributions. SILT in axillary, median, radial, and ulnar distributions.  2+ radial pulse with warm and well perfused digits. Compartments soft and compressible.      LUE: Chronic deformity to wrist. Skin intact. No tenting, impending open fracture. Nontender to palpation, with full and painless ROM throughout. + Motor in Axillary, AIN, PIN, Ulnar distributions. SILT in axillary, median, radial, and ulnar distributions. 2+ radial pulse with warm and well perfused digits. Compartments soft and compressible.     RLE: No swelling, ecchymoses, deformity, or effusion. Skin intact.  No tenting, impending open fracture. Nontender to palpation, with full and painless ROM throughout. + GS/TA/EHL. SILT in DP/SP/S/S/T distributions.  2+ DP pulse with warm and well perfused toes. Compartments soft and compressible, with no pain on passive stretch.      LLE: Pain with hip IR/ER, no pain with log roll or flexion to 90 degrees. No swelling, ecchymoses, deformity, or effusion. Skin intact. No tenting, impending open fracture.Tenderness to palpation globally about the hip. Otherwise, nontender to palpation, with full and painless ROM throughout. + GS/TA/EHL. SILT in DP/SP/S/S/T distributions. 2+ DP pulse with warm and well perfused toes. Compartments soft and compressible, with no pain on passive stretch.           Test Results  Imaging  Radiology studies were personally reviewed.  Pelvis xray: LC1 pelvis fracture. Left superior ramus fracture.   MRI pelvis: LC1 pelvis fracture. Small fluid collection in the femoral neck on the right side without a discrete fracture line.     Labs  Lab Results   Component Value Date    WBC 13.4 (H) 08/22/2023    HGB 14.2 08/22/2023    HCT 43.3 08/22/2023    PLT >494 (H) 08/22/2023      No results for input(s): SPECTYPEART, PHART, PCO2ART, PO2ART, HCO3ART, BEART, O2SATART in the last 24 hours.   Lab Results   Component Value Date    NA 137 08/22/2023    K  08/22/2023      Comment:      Specimen hemolyzed.    CR 1.11 01/21/2015    GLU 145 08/22/2023     Lab Results   Component Value Date    PT 10.7 08/22/2023  INR 0.95 08/22/2023    APTT 32.8 10/20/2014     Lab Results   Component Value Date    CRP <0.5 11/27/2011     Lab Results   Component Value Date    ALB 3.7 (L) 06/25/2023                     Comorbidities  See primary team documentation, or consult notes from medical or trauma services.    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

## 2023-08-23 NOTE — Unmapped (Signed)
OCCUPATIONAL THERAPY  Evaluation (08/23/23 0947)    Patient Name:  Heidi Blankenship       Medical Record Number: [id number]     Date of Birth: 1946/11/22  Sex: Female      Post-Discharge Occupational Therapy Recommendations: 5x weekly, Low intensity          Equipment Recommendation  OT DME Recommendations: Defer to post acute, Bedside commode, Shower chair with back (BSC and shower chair if to dc home)       OT Treatment Diagnosis: Reduced mobility    Pt presents to acute OT services with significant LLE pain, impaired balance, decreased activity tolerance, and reduced mobility impacting ADL performance    Assessment  Problem List: Decreased strength, Decreased range of motion, Pain, Decreased activity tolerance, Decreased endurance, Impaired balance, Decreased mobility, Gait deviation, Fall risk, Impaired ADLs  Personal Factors/Comorbidities (Occupational Profile and History Review): Expanded (Moderate)     Clinical Decision Making: Moderate Complexity    Assessment: Heidi Blankenship is a 77 y.o. female with PMHx of DM, ESRD s/p renal transplant, skin cancer, PVD w/ right stents 2019 whom uses a walker/cane to ambulate since a GLF in 04/2023. Ms. Pridgeon participated well during today's OT evaluation, but was limited by pain. She reports being highly independent at baseline and over the last week has not been ambulating. She lives alone but has supportive sisters who may be able to provide intermittent support. She is currently limited by pain, decreased activity tolerance, and decreased ROM in LLE. She would continue to benefit from skilled OT services to address deficits. She is appropriate for post-acute discharge recommendation of 5x weekly with low intensity pending improvement in pain management. After review of the patient's occupational profile and history, assessment of occupational performance, clinical decision making, and development of POC, the patient presents as a moderate complexity case. Today's Interventions: Pt educated re role of OT, POC, modification of ADLs. She performed bed mobility, STS from EOB, required total A for donning/doffing shoe, and MAX A for toileting hygiene and management.    Activity Tolerance During Today's Session  Limited by pain    Plan  Planned Frequency of Treatment: Plan of Care Initiated: 08/23/23  1-2x per day for: 3-4x week  Planned Treatment Duration: 08/30/23    Planned Interventions:  Education (Patient/Family/Caregiver), Self-Care/Home Training, Therapeutic Exercise, Therapeutic Activity, Home Exercise Program      GOALS:   Patient and Family Goals: To begin a pain regimen to tolerate more OOB activity and return to PLOF    Short Term:   SHORT GOAL #1: Pt will perform BSC t/f with SBA using LRAD   Time Frame : 1 week  SHORT GOAL #2: Pt will perform toileting routine with MIN A using compensatory strategies PRN   Time Frame : 1 week  SHORT GOAL #3: Pt will perform LB dressing routine with MIN A using compensatory strategies PRN   Time Frame : 1 week  SHORT GOAL #4: Pt will perform 5+ minute bimanual grooming routine with SBA using LRAD   Time Frame : 1 week            Long Term Goal #1: Pt will be grossly SBA with all ADLs  Time Frame: 3 weeks    Prognosis:  Guarded  Positive Indicators:  PLOF  Barriers to Discharge: Decreased caregiver support, Inaccessible home environment, Pain, Severity of deficits, Endurance deficits, Inability to safely perform ADLS    Subjective  Medical Updates Since Last Visit/Relevant PMH  Affecting Clinical Decision Making: N/A  Prior Functional Status Pt reports being highly independent at baseline. Within the last few weeks, she has significantly deconditioned due to pain. On the 17th of August, she realized she needed a mobility device and purchased a RW. This past weekend beginning on Saturday, the patient's pain increased and she reduced her mobility. On Sunday, she was able to ambulate to the bathroom but could not stand, ultimately sliding off the toilet and crawling back to bed. She reports using a waste basket for urination. She was able to change her clothes but was not showering. She denies recent falls or injuries outside of the fall she sustained in May of 2024. She is typically a driver. She has a sister who lives very close by and another sister was present and supportivr during the session.    Medical Tests / Procedures: EPIC reviewed       Patient / Caregiver reports: Pt reports I know that we will not be standing today      Past Medical History:   Diagnosis Date    Arthritis Oct. 2020    Pain & swelling. hands    Basal cell carcinoma     Complication of anesthesia 2013    Sick afterward    Diabetes mellitus (CMS-HCC)     Disease of thyroid gland     ESRD (end stage renal disease) on dialysis (CMS-HCC)     History of nonmelanoma skin cancer 05/31/2015    Kidney transplant recipient     Lymphedema     RUL and RLL    Social History     Tobacco Use    Smoking status: Former     Current packs/day: 0.00     Average packs/day: 0.5 packs/day for 35.0 years (17.5 ttl pk-yrs)     Types: Cigarettes     Start date: 10/21/1979     Quit date: 10/20/2014     Years since quitting: 8.8    Smokeless tobacco: Never    Tobacco comments:     Off and on use   Substance Use Topics    Alcohol use: Not Currently      Past Surgical History:   Procedure Laterality Date    PR LIGATN ANGIOACCESS AV FISTULA Right 05/11/2015    Procedure: LIGATION OR BANDING OF ANGIOACCESS ARTERIOVENOUS FISTULA;  Surgeon: Purcell Mouton, MD;  Location: MAIN OR [place];  Service: Transplant    PR TRANSPLANT,PREP CADAVER RENAL GRAFT N/A 10/20/2014    Procedure: [facility] STD PREP CAD DONR RENAL ALLOGFT PRIOR TO TRNSPLNT, INCL DISSEC/REM PERINEPH FAT, DIAPH/RTPER ATTAC;  Surgeon: Purcell Mouton, MD;  Location: MAIN OR [place];  Service: Transplant    PR TRANSPLANTATION OF KIDNEY N/A 10/20/2014    Procedure: RENAL ALLOTRANSPLANTATION, IMPLANTATION OF GRAFT; WITHOUT RECIPIENT NEPHRECTOMY;  Surgeon: Purcell Mouton, MD;  Location: MAIN OR ;  Service: Transplant    SKIN BIOPSY      TONSILLECTOMY  1953    TUBAL LIGATION  1985    Family History   Problem Relation Age of Onset    Multiple myeloma Mother     Diabetes Father     Diabetes Sister     Melanoma Neg Hx     Basal cell carcinoma Neg Hx     Squamous cell carcinoma Neg Hx         Diclofenac sodium, Latex, Penicillins, and Percocet [oxycodone-acetaminophen]     Objective Findings  Precautions / Restrictions  Falls precautions  Weight Bearing  LLE WBAT - Weight bearing as tolerated, RLE WBAT - Weight bearing as tolerated    Required Braces or Orthoses  Non-applicable    Communication Preference  Verbal       Pain  Pt endorsed 4/10 pain in L groin radiating to knee. With movement, pain increased to 20/10. Acivities graded for pain and pt given rest breaks as needed.    Equipment / Environment  Vascular access (PIV, TLC, Port-a-cath, PICC), Purewick/Condom catheter, Telemetry    Living Situation  Living Environment: House  Lives With: Alone  Home Living: One level home, Stairs to enter with rails, Walk-in shower, Standard height toilet, Built-in shower seat  Rail placement (outside): Bilateral rails in reach  Number of Stairs to Enter (outside): 5  Caregiver Identified?: No  Equipment available at home: Straight cane, Rolling walker     Cognition   Orientation Level:  Oriented x 4   Arousal/Alertness:  Appropriate responses to stimuli   Attention Span:  Attends with cues to redirect   Memory:  Appears intact   Following Commands:  Follows all commands and directions without difficulty   Safety Judgment:  Good awareness of safety precautions   Awareness of Errors and Problem Solving:  Assistance required to generate solutions   Comments: Pt demonstrated frustration with therapists at beginning of session, requiring additional time for rapport building and therapeutic listening. Pt has had poor experiences with medical system and therapy in the past.    Vision / Hearing   Vision: No acute deficits identified, Wears glasses for reading only, Glasses present     Hearing: No deficit identified         Hand Function:  Right Hand Function: Right hand function impaired  Right Hand Impairment: grip strength fair, serial opposition intact  Left Hand Function: Left hand function impaired  Left Hand Impairment: grip strength fair, serial opposition intact  Hand Function comments: Swan neck deformity noted in B hands  Hand Dominance: Right    Skin Inspection:  Skin Inspection: Intact where visualized  Skin Inspection comment: Questionable RUE swelling    Face/Cervical ROM:  Face ROM: WFL  Cervical ROM: WFL    ROM / Strength:  UE ROM/Strength: Left Impaired/Limited, Right Impaired/Limited  RUE Impairment: Reduced strength  LUE Impairment: Pain with movement, Reduced strength, Limited AROM (Pt reports having L shoulder arthritis and has pain with shoulder flexion. Pt able to perform shoulder flexion to ~70 degrees prior to onset of pain)  LE ROM/Strength: Left Impaired/Limited, Right WFL  LLE Impairment: Pain with movement, Limited AROM, Reduced strength, Limited PROM (Pain with L hip and knee AROM. Deferred PROM 2/2 pain.)    Coordination:  Coordination: WFL    Sensation:  RUE Sensation: RUE intact  LUE Sensation: LUE intact  RLE Sensation: RLE intact  LLE Sensation: LLE impaired  LLE Sensation Impairment: Tingling    Balance:  Static Sitting-Level of Assistance: Stand by Assistance  Dynamic Sitting-Level of Assistance: Contact guard    Static Standing-Level of Assistance: Minimum assistance (with RW)  Dynamic Standing - Level of Assistance:  (NT)    Functional Mobility  Transfers: Min assist (leaning towards R side to offload LLE)  Bed Mobility - Needs Assistance: Standby assist, Min assist (supine to sit with SBA; sit to supine with MIN A for LE management)  Ambulation: NT - pt unable to progress to ambulation due to pain    ADLs  ADLs: Needs assistance with ADLs  ADLs - Needs Assistance: LB  dressing, UB dressing, Toileting, Bathing, Grooming, Feeding  Feeding - Needs Assistance: Set Up Assist, Performed at bed level  Grooming - Needs Assistance: Set Up Assist, Performed seated  Bathing - Needs Assistance: Mod assist, Performed at bed level  Toileting - Needs Assistance: Max assist, Performed at bed level  UB Dressing - Needs Assistance: Min assist, Performed seated  LB Dressing - Needs Assistance: Max assist, Performed at bed level  IADLs: NT    Vitals / Orthostatics  Vitals/Orthostatics: VSS per telemetry    Patient at end of session: All needs in reach, In bed, Friends/Family present, Lines intact, Nurse notified     Occupational Therapy Session Duration  OT Individual [mins]: 10  OT Co-Treatment [mins]: 40 (with Alphonsa Gin, PT)  Reason for Co-treatment: Poor pain control, To safely progress mobility, Poor activity tolerance       AM-PAC-Daily Activity  Lower Body Dressing assistance needs: A lot - Maximum/Moderate Assistance  Bathing assistance needs: A lot - Maximum/Moderate Assistance  Toileting assistance needs: A lot - Maximum/Moderate Assistance  Upper Body Dressing assistance needs: A Little - Minimal/Contact Guard Assist/Supervision  Personal Grooming assistance needs: A Little - Minimal/Contact Guard Assist/Supervision  Eating Meals assistance needs: None - Modified Independent/Independent    Daily Activity Score: 16    Score (in points): % of Functional Impairment, Limitation, Restriction  6: 100% impaired, limited, restricted  7-8: At least 80%, but less than 100% impaired, limited restricted  9-13: At least 60%, but less than 80% impaired, limited restricted  14-19: At least 40%, but less than 60% impaired, limited restricted  20-22: At least 20%, but less than 40% impaired, limited restricted  23: At least 1%, but less than 20% impaired, limited restricted  24: 0% impaired, limited restricted      I attest that I have reviewed the above information.  Signed: Sande Rives, OT  Filed 08/23/2023    The care for this patient was completed by Sande Rives, OT:  A student was present and Observed patient care.    Sande Rives, OT

## 2023-08-23 NOTE — Unmapped (Signed)
ORTHOPAEDIC PROGRESS NOTE    ASSESSMENT:  77 y.o. female admitted for the following problems:  L ramus fx with mild SI widening, consistent with LC1 pelvis injury      PLAN:  -WB status: weightbearing as tolerated on the right lower extremity left lower exremity  -please obtain postmobilization XR after pt has been OOB and walking  -no operative intervention anticipated at this time      #Dispo:  -Discharge plan: per primary  -Follow up plan: We will arrange appropriate orthopedic follow-up.  If operative, follow-up will be 10-14 days post-op with operative surgeon, we will send in    ____________________________________    For urgent questions, please contact the Orthopaedic Consult Pager at [phone].   Orthopaedic Trauma PAs: Rich Number  Current Orthopaedic Attending: Dr. Leland Johns    SUBJECTIVE:  Reports mild pain in the L hip. Denies pain elsewhere. Left wrist has been functional, she has been using for ADLs.    OBJECTIVE:  PE:  BP 175/65  - Pulse 71  - Temp 36.7 ??C (98.1 ??F) (Oral)  - Resp 17  - Ht 157.5 cm (5' 2)  - Wt 64.6 kg (142 lb 6.4 oz)  - LMP  (LMP Unknown)  - SpO2 99%  - BMI 26.05 kg/m??     Labs:  Lab Results   Component Value Date    WBC 13.4 (H) 08/22/2023    WBC 8.4 06/25/2023    RBC 4.70 08/22/2023    RBC 4.57 06/25/2023    HGB 14.2 08/22/2023    HGB 13.5 06/25/2023    Hgb, blood gas 13.3 12/03/2013    HCT 43.3 08/22/2023    HCT 40.8 06/25/2023    Platelet >494 (H) 08/22/2023    Platelet 570 (H) 06/25/2023    Creatinine 1.11 01/21/2015         RUE:     Skin is intact without lesions/fluctuant areas.    No tenderness to palpation from shoulder through fingertips.    Ligamentous stable elbow and wrist.    SILT to the tip of the thumb, index and small fingers as well as dorsum of the hand.    Fires FDP to index, IO muscles, EIP/EDC to index.    Fingertips warm and well perfused with brisk capillary refill    LUE:     Skin is intact without lesions/fluctuant areas.    No tenderness to palpation from shoulder through fingertips.    Ligamentous stable elbow and wrist.    SILT to the tip of the thumb, index and small fingers as well as dorsum of the hand.    Fires FDP to index, IO muscles, EIP/EDC to index.    Fingertips warm and well perfused with brisk capillary refill    RLE:    Skin is intact without any lesions/fluctuant areas.    Hip painless with log roll, knee ligamentously stable, ankle ligamentously stable.  Able to perform SLR.    No tenderness from hip to toes  SILT in the 1st dorsal webspace, dorsum of the foot, plantar surface of the foot, medial arch and lateral border of the foot.    Fires EHL, FHL, GSC, TA.    Toes with brisk capillary refill, warm and well perfused    LLE:    Skin is intact without any lesions/fluctuant areas.    Hip painless with log roll, knee ligamentously stable, ankle ligamentously stable.  Able to perform SLR.    No tenderness from hip to toes  SILT in the 1st dorsal webspace, dorsum of the foot, plantar surface of the foot, medial arch and lateral border of the foot.    Fires EHL, FHL, GSC, TA.    Toes with brisk capillary refill, warm and well perfused      IMAGING:  Reviewed; showing LC1 pelvis injury that is new compared to previous films in May

## 2023-08-23 NOTE — Unmapped (Signed)
PHYSICAL THERAPY  Evaluation (08/23/23 0946)    Patient Name:  Heidi Blankenship       Medical Record Number: [id number]   Date of Birth: [birth year]  Sex: Female        Post-Discharge Physical Therapy Recommendations:  PT Post Acute Discharge Recommendations: Skilled PT services indicated, 5x weekly, Low intensity     Equipment Recommendation  PT DME Recommendations: Defer to post acute          Treatment Diagnosis: Abnormalities of gait and mobility. Pain     Activity Tolerance: Limited by pain     ASSESSMENT  Problem List: Decreased strength, Decreased range of motion, Pain, Decreased mobility, Gait deviation, Fall risk, Impaired ADLs      Assessment: Heidi Blankenship is a 76 year old history significant for peripheral vascular disease status post PTA stent with right SFA and popliteal in 2019, recent mechanical fall in May 2024 with a sacral alla fracture and left wrist fracture, diabetes, ESRD status post renal transplant, presenting for evaluation today of left hip and groin pain. The pain has been progressive since recent fall of May 2024, but is significantly worsened over the last several days. Per imaging, pt with L ramus fx with mild SI widening, consistent with LC1 pelvis injury. Orthopedics recommending no operative intervention and BLE WBAT.         Prior to onset of acute pain and symptoms, pt was ambulating household and community distances without device. Pt has begun using RW in home but has been unable to ambulate at all d/t pain over the last several days. Pt presents to PT evaluation with 4/10 L anterior hip/groin pain at rest that radiates medially to knee. Pain level increases to 10/10 with all mobility. Pt required min-A and significantly increased time to complete OOB transfers with use of RW. Pt with decreased weightbearing through LLE in standing d/t pain. Pt was unable to progress to gait assessment d/t severity of pain.         Pt is appropriate for acute skilled PT services to progress activity, as tolerated, and is unsafe to return home alone at this time. Currently recommend post-acute PT services at frequency of 5x/week low intensity for best return to PLOF.     After a review of the personal factors, comorbidities, clinical presentation, and examination of the number of affected body systems, the patient presents as a moderate complexity case.     Today's Interventions: Therapeutic activity  Today's Interventions: AMPAC 6 clicks: 11/24, PT Evaluation, bed mobility, unsupported sitting balance/tolerance at EOB, OOB transfer training with RW, standing tolerance with RW. PT Education: PT role and POC, safety and fall precautions, risks of prolonged bedrest, bed level AROM exercises      PLAN  Planned Frequency of Treatment: Plan of Care Initiated: 08/23/23  1-2x per day for: 4-5x week  Planned Treatment Duration: 09/06/23     Planned Interventions: Education (Patient/Family/Caregiver), Gait training, Therapeutic Exercise, Self-care / Home Management training, Therapeutic Activity, Home exercise program, Wheelchair Management     Goals:   Patient and Family Goals: To make this pain go away.     SHORT GOAL #1: Pt will perform bed mobility from flat surface modified independent.               Time Frame : 2 weeks  SHORT GOAL #2: Pt will perform OOB transfers with RW modified independent.              Time Frame : 2  weeks  SHORT GOAL #3: Pt will ambulate 88ft with RW with SBA for improved access to home environment.              Time Frame : 2 weeks  SHORT GOAL #4: Pt will ascend/descend 5 steps with UE support on bilateral handrails with SBA for safe entry/exit into home.               Time Frame : 2 weeks      Long Term Goal #1: In 4 weeks, pt will score 24/24 on AMPAC 6 clicks:  Time Frame: 4 weeks     Prognosis:  Fair  Positive Indicators: Reported PLOF  Barriers to Discharge: Decreased caregiver support, Inaccessible home environment, Pain, Severity of deficits     SUBJECTIVE  Communication Preference: Verbal,     Patient reports: Pt agrees to PT evaluation and states, I've always been stubborn. I hate physical therapy. Pt's sister at bedside.      Prior Functional Status: Prior to fall in May 2024 (sustained sacral alla fracture and left wrist fracture), pt was an independent community distance ambulator without device, independent with ADLs and IADLs, driving and living alone. Pt reports insidious onset of LLE pain around August 17th that has progressively worsened. She has begun using RW in home since then, able to ambulate to/from bathroom but mostly laying in bed. On Sunday, August 25th, pt walked to bathroom in home with RW but was unable to stand from toilet d/t pain, had to crawl back to bed. Since then pt has been urinating in a waste basket while sitting EOB. No falls or clear mechanism of reinjury.  Equipment available at home: Straight cane, Rolling walker      Past Medical History:   Diagnosis Date    Allergic 1953. Penicillin    Very bad rash    Arthritis Oct. 2020    Pain & swelling. hands    Basal cell carcinoma     Complication of anesthesia 2013    Sick afterward    Diabetes mellitus (CMS-HCC)     Disease of thyroid gland     ESRD (end stage renal disease) on dialysis (CMS-HCC)     History of nonmelanoma skin cancer 05/31/2015            Social History     Tobacco Use    Smoking status: Former     Current packs/day: 0.00     Average packs/day: 0.5 packs/day for 35.0 years (17.5 ttl pk-yrs)     Types: Cigarettes     Start date: 10/21/1979     Quit date: 10/20/2014     Years since quitting: 8.8    Smokeless tobacco: Never    Tobacco comments:     Off and on use   Substance Use Topics    Alcohol use: Not Currently       Past Surgical History:   Procedure Laterality Date    PR LIGATN ANGIOACCESS AV FISTULA Right 05/11/2015    Procedure: LIGATION OR BANDING OF ANGIOACCESS ARTERIOVENOUS FISTULA;  Surgeon: Purcell Mouton, MD;  Location: MAIN OR [place];  Service: Transplant    PR TRANSPLANT,PREP CADAVER RENAL GRAFT N/A 10/20/2014    Procedure: [facility] STD PREP CAD DONR RENAL ALLOGFT PRIOR TO TRNSPLNT, INCL DISSEC/REM PERINEPH FAT, DIAPH/RTPER ATTAC;  Surgeon: Purcell Mouton, MD;  Location: MAIN OR [place];  Service: Transplant    PR TRANSPLANTATION OF KIDNEY N/A 10/20/2014    Procedure: RENAL ALLOTRANSPLANTATION, IMPLANTATION OF GRAFT;  WITHOUT RECIPIENT NEPHRECTOMY;  Surgeon: Purcell Mouton, MD;  Location: MAIN OR [facility];  Service: Transplant    SKIN BIOPSY      TONSILLECTOMY  1953    TUBAL LIGATION  1985             Family History   Problem Relation Age of Onset    Multiple myeloma Mother     Diabetes Father     Diabetes Sister     Melanoma Neg Hx     Basal cell carcinoma Neg Hx     Squamous cell carcinoma Neg Hx      Allergies: Diclofenac sodium, Latex, Penicillins, and Percocet [oxycodone-acetaminophen]       Objective Findings  Precautions / Restrictions  Precautions: Falls precautions  Weight Bearing Status: LLE WBAT - Weight bearing as tolerated, RLE WBAT - Weight bearing as tolerated  Required Braces or Orthoses: Non-applicable     Pain Comments: Endorses 4/10 L anterior hip and groin pain at rest, radiates down medial thigh to knee. Pain level increases to 20/10 with all movement. Describes pain as sharp and excruciating.  Medical Tests / Procedures: Medical record, lab values, imaging, and vital signs reviewed. XR Pelvis 8/29: No new fractures. Redemonstrated mildly displaced fracture of the left superior pubic ramus. Nondisplaced left inferior pubic ramus fracture. Left greater than right sclerosis along the bilateral SI joints, compatible with bilateral sacral insufficiency fractures. MRI Pelvis 8/28: -Insufficiency fractures of the bilateral sacral ala and sacral promontory as well as the left pubic tubercle with robust marrow edema suggesting an acute to subacute process.    -Minimally displaced fractures of the left superior and inferior pubic rami which appear acute to subacute.  Equipment / Environment: Vascular access (PIV, TLC, Port-a-cath, PICC), Purewick/Condom catheter      Living Situation  Living Environment: House  Lives With: Alone  Home Living: One level home, Stairs to enter with rails, Walk-in shower, Standard height toilet  Rail placement (outside): Bilateral rails in reach  Number of Stairs to Enter (outside): 5  Caregiver Identified?: Yes (pt's 2 sisters live close by)  Caregiver Availability: Intermittent  Caregiver Ability: Limited lifting      Cognition: WFL  Orientation: Oriented x4  Visual/Perception: Wears Glasses/Contacts (readers)  Hearing: No deficit identified     Skin Inspection: Intact where visualized, Good skin integrity     Upper Extremities  UE ROM: Right WFL, Left Impaired/Limited  LUE ROM Impairment: Pain with movement, Limited AROM (Active shoulder flexion to ~90*, reports L shoulder arthritis limiting ROM)  UE Strength: Right WFL, Left Impaired/Limited  LUE Strength Impairment: Reduced strength, Pain with movement  UE comment: Limited L shoulder strength    Lower Extremities  LE ROM: Right WFL, Left Impaired/Limited  LLE ROM Impairment: Limited AROM, Limited PROM, Pain with movement (L ankle DF/PF WFL, limited L heel slide (to ~50* of knee flexion), limited hip abduction/adduction (limited by pain))  LE Strength: Right WFL, Left Impaired/Limited  LLE Strength Impairment: Reduced strength, Pain with movement     Sensation: WFL (Denies numbness and tingling throughout)    Static Sitting-Level of Assistance: Standby assistance  Dynamic Sitting-Level of Assistance: Contact guard    Static Standing-Level of Assistance: Minimum assistance  Standing Balance comments: with heavy UE support on rolling walker      Bed Mobility: Supine to Sit (towards the right)  Supine to Sit assistance level: Standby assist, set-up cues, supervision of patient - no hands on (with significantly increased time and compensatory movement  pattern)  Bed Mobility comments: Sit to supine with min-A for LLE management     Transfers: Sit to Stand  Sit to Stand assistance level: Minimal assist, patient does 75% or more  Transfer comments: with UE support on RW; pt with R lateral lean to offload painful LLE. Required cues for proper hand placement on RW, pt attempting to push up from low bars on RW.      Gait Level of Assistance: Other (Comment)  Skilled Treatment Performed: Unable to progress to gait d/t pain      Endurance: Limited activity tolerance d/t pain    Patient at end of session: All needs in reach, In bed, Friends/Family present    Physical Therapy Session Duration  PT Individual [mins]: 10  PT Co-Treatment [mins]: 40  Reason for Co-treatment: Poor pain control, To safely progress mobility (Seen with Kathalene Frames, OT)      AM-PAC-6 click  Help currently need turning over In bed?: Unable to do/total assistance - Total Dependent Assist  Help currently needed sitting down/standing up from chair with arms? : A Little - Minimal/Contact Guard Assist/Supervision  Help currently needed moving from supine to sitting on edge of bed?: A Little - Minimal/Contact Guard Assist/Supervision  Help currently needed moving to and from bed from wheelchair?: A lot - Maximum/Moderate Assistance  Help currently needed walking in a hospital room?: Unable to do/total assistance - Total Dependent Assist  Help currently needed climbing 3-5 steps with railing?: Unable to do/total assistance - Total Dependent Assist    Basic Mobility Score 6 click: 11    6 click Score (in points): % of Functional Impairment, Limitation, Restriction  6: 100% impaired, limited, restricted  7-8: At least 80%, but less than 100% impaired, limited restricted  9-13: At least 60%, but less than 80% impaired, limited restricted  14-19: At least 40%, but less than 60% impaired, limited restricted  20-22: At least 20%, but less than 40% impaired, limited restricted  23: At least 1%, but less than 20% impaired, limited restricted  24: 0% impaired, limited restricted    I attest that I have reviewed the above information.  Signed: Alphonsa Gin, PT  [facility] 08/23/2023

## 2023-08-24 LAB — BASIC METABOLIC PANEL
ANION GAP: 6 mmol/L (ref 5–14)
BLOOD UREA NITROGEN: 22 mg/dL (ref 9–23)
BUN / CREAT RATIO: 21
CALCIUM: 8.9 mg/dL (ref 8.7–10.4)
CHLORIDE: 105 mmol/L (ref 98–107)
CO2: 24 mmol/L (ref 20.0–31.0)
CREATININE: 1.03 mg/dL — ABNORMAL HIGH
EGFR CKD-EPI (2021) FEMALE: 56 mL/min/{1.73_m2} — ABNORMAL LOW (ref >=60)
GLUCOSE RANDOM: 121 mg/dL (ref 70–179)
POTASSIUM: 5 mmol/L — ABNORMAL HIGH (ref 3.4–4.8)
SODIUM: 135 mmol/L (ref 135–145)

## 2023-08-24 LAB — CBC
HEMATOCRIT: 38.9 % (ref 34.0–44.0)
HEMOGLOBIN: 12.8 g/dL (ref 11.3–14.9)
MEAN CORPUSCULAR HEMOGLOBIN CONC: 32.8 g/dL (ref 32.0–36.0)
MEAN CORPUSCULAR HEMOGLOBIN: 30.4 pg (ref 25.9–32.4)
MEAN CORPUSCULAR VOLUME: 92.5 fL (ref 77.6–95.7)
MEAN PLATELET VOLUME: 7.4 fL (ref 6.8–10.7)
PLATELET COUNT: 676 10*9/L — ABNORMAL HIGH (ref 150–450)
RED BLOOD CELL COUNT: 4.2 10*12/L (ref 3.95–5.13)
RED CELL DISTRIBUTION WIDTH: 17.5 % — ABNORMAL HIGH (ref 12.2–15.2)
WBC ADJUSTED: 8.7 10*9/L (ref 3.6–11.2)

## 2023-08-24 LAB — TACROLIMUS LEVEL, TROUGH: TACROLIMUS, TROUGH: 2.5 ng/mL — ABNORMAL LOW (ref 5.0–15.0)

## 2023-08-24 LAB — SIROLIMUS LEVEL: SIROLIMUS LEVEL BLOOD: 3.6 ng/mL (ref 3.0–20.0)

## 2023-08-24 MED ADMIN — insulin NPH (HumuLIN,NovoLIN) injection 25 Units: 25 [IU] | SUBCUTANEOUS | @ 14:00:00

## 2023-08-24 MED ADMIN — insulin lispro (HumaLOG) injection 0-20 Units: 0-20 [IU] | SUBCUTANEOUS

## 2023-08-24 MED ADMIN — clopidogrel (PLAVIX) tablet 75 mg: 75 mg | ORAL | @ 13:00:00

## 2023-08-24 MED ADMIN — predniSONE (DELTASONE) tablet 5 mg: 5 mg | ORAL | @ 13:00:00

## 2023-08-24 MED ADMIN — lisinopril (PRINIVIL,ZESTRIL) tablet 10 mg: 10 mg | ORAL | @ 13:00:00

## 2023-08-24 MED ADMIN — tacrolimus (PROGRAF) capsule 1.5 mg: 1.5 mg | ORAL | @ 13:00:00

## 2023-08-24 MED ADMIN — enoxaparin (LOVENOX) syringe 40 mg: 40 mg | SUBCUTANEOUS

## 2023-08-24 MED ADMIN — pantoprazole (Protonix) EC tablet 40 mg: 40 mg | ORAL | @ 13:00:00

## 2023-08-24 MED ADMIN — sirolimus (RAPAMUNE) tablet 1 mg: 1 mg | ORAL | @ 13:00:00

## 2023-08-24 MED ADMIN — atorvastatin (LIPITOR) tablet 20 mg: 20 mg | ORAL

## 2023-08-24 MED ADMIN — cholecalciferol (vitamin D3 25 mcg (1,000 units)) tablet 50 mcg: 50 ug | ORAL | @ 13:00:00

## 2023-08-24 MED ADMIN — insulin lispro (HumaLOG) injection 0-20 Units: 0-20 [IU] | SUBCUTANEOUS | @ 23:00:00

## 2023-08-24 MED ADMIN — levothyroxine (SYNTHROID) tablet 100 mcg: 100 ug | ORAL | @ 10:00:00

## 2023-08-24 MED ADMIN — acetaminophen (TYLENOL) tablet 1,000 mg: 1000 mg | ORAL | @ 06:00:00

## 2023-08-24 MED ADMIN — celecoxib (CeleBREX) capsule 100 mg: 100 mg | ORAL | @ 13:00:00

## 2023-08-24 MED ADMIN — HYDROmorphone (DILAUDID) tablet 4 mg: 4 mg | ORAL | @ 18:00:00 | Stop: 2023-08-24

## 2023-08-24 MED ADMIN — celecoxib (CeleBREX) capsule 100 mg: 100 mg | ORAL

## 2023-08-24 NOTE — Unmapped (Signed)
Problem: Skin Injury Risk Increased  Goal: Skin Health and Integrity  Intervention: Optimize Skin Protection  Recent Flowsheet Documentation  Taken 08/24/2023 1200 by Dian Situ, RN  Activity Management: bedrest  Pressure Reduction Techniques: frequent weight shift encouraged  Pressure Reduction Devices: pressure-redistributing mattress utilized  Taken 08/24/2023 1000 by Dian Situ, RN  Activity Management: bedrest  Pressure Reduction Techniques: frequent weight shift encouraged  Pressure Reduction Devices: pressure-redistributing mattress utilized  Taken 08/24/2023 0800 by Dian Situ, RN  Activity Management: bedrest  Pressure Reduction Techniques: frequent weight shift encouraged  Pressure Reduction Devices: pressure-redistributing mattress utilized     Problem: Adult Inpatient Plan of Care  Goal: Absence of Hospital-Acquired Illness or Injury  Intervention: Identify and Manage Fall Risk  Recent Flowsheet Documentation  Taken 08/24/2023 1200 by Dian Situ, RN  Safety Interventions:   bed alarm   fall reduction program maintained   low bed  Taken 08/24/2023 1000 by Dian Situ, RN  Safety Interventions:   fall reduction program maintained   low bed  Taken 08/24/2023 0800 by Dian Situ, RN  Safety Interventions:   fall reduction program maintained   low bed  Intervention: Prevent Skin Injury  Recent Flowsheet Documentation  Taken 08/24/2023 1200 by Dian Situ, RN  Positioning for Skin: Bed in Chair  Taken 08/24/2023 1000 by Dian Situ, RN  Positioning for Skin: Bed in Chair  Taken 08/24/2023 0800 by Dian Situ, RN  Positioning for Skin: Supine/Back     Problem: Fall Injury Risk  Goal: Absence of Fall and Fall-Related Injury  Intervention: Promote Injury-Free Environment  Recent Flowsheet Documentation  Taken 08/24/2023 1200 by Dian Situ, RN  Safety Interventions:   bed alarm   fall reduction program maintained   low bed  Taken 08/24/2023 1000 by Dian Situ, RN  Safety Interventions:   fall reduction program maintained   low bed  Taken 08/24/2023 0800 by Dian Situ, RN  Safety Interventions:   fall reduction program maintained   low bed

## 2023-08-24 NOTE — Unmapped (Addendum)
Tacrolimus/Sirolimus Therapeutic Monitoring Pharmacy Note    Heidi Blankenship is a 77 y.o. female continuing tacrolimus.     Indication: Kidney transplant     Date of Transplant:  10/20/14       Prior Dosing Information: Home regimen Tacrolimus 1.5 mg QAM and 1 mg QPM; Sirolimus 1 mg daily                                                                      SEE BELOW FOR CURRENT INPATIENT DOSING / LEVELS    Source(s) of information used to determine prior to admission dosing: Clinic Note    Goals:  Therapeutic Drug Levels  Tacrolimus trough goal:  2-4 ng/ml  Sirolimus trough goal: 3-8 ng/ml    Additional Clinical Monitoring/Outcomes  Monitor renal function (SCr and urine output) and liver function (LFTs)  Monitor for signs/symptoms of adverse events (e.g., hyperglycemia, hyperkalemia, hypomagnesemia, hypertension, headache, tremor)    Results:   Tacrolimus level: Not applicable    Pharmacokinetic Considerations and Significant Drug Interactions:  Concurrent hepatotoxic medications: atorva 20mg  (HMed) Acetaminophen 1000mg  q8h (NEW)  Concurrent CYP3A4 substrates/inhibitors: None identified  Concurrent nephrotoxic medications: Celecoxib 100mg  bid(NEW), lisinopril 10mg  daily (HMed)    Sirolimus Dosing + Tacrolimus Dosing  Sirolimus 1mg  AM +  Tacro 1.5mg  AM and 1mg  PM      Sirolimus + Tacrolimus Levels  Siro = 3.6 ng/mL at 0743 + Tacro = 2.5 ng/mL at 0743      Assessment/Plan:  8/30 Both Sirolimus + tacrolimus - are within goal limits  8/30 Scr slight elevation (1.03) - 0.85 on adm (8/28)    Recommendedation(s) unless otherwise indicated by Transplant Team  Continue --tacrolimus 1.5 mg QAM and 1 mg QPM  Continue --sirolimus 1 mg daily  Use caution with celecoxib (increase risk for nephrotoxicity, especially with tacro/sirolimus) - consider alternative therapy    Follow-up  Tacrolimus + Sirolimus Levels:  MWF- with AM labs between 0600-0800 PRIOR to morning doses.  Doses due 0800  A pharmacist will continue to monitor and recommend levels as appropriate    Please page service pharmacist with questions/clarifications.    Marylene Buerger, PharmD  Clinical Specialist - Internal Medicine  [facility], [facility] APP Team Pharmacist  August 24, 2023 1:00 PM

## 2023-08-24 NOTE — Unmapped (Signed)
Problem: Skin Injury Risk Increased  Goal: Skin Health and Integrity  Outcome: Progressing  Intervention: Optimize Skin Protection  Recent Flowsheet Documentation  Taken 08/24/2023 1200 by Dian Situ, RN  Activity Management: bedrest  Pressure Reduction Techniques: frequent weight shift encouraged  Pressure Reduction Devices: pressure-redistributing mattress utilized  Taken 08/24/2023 1000 by Dian Situ, RN  Activity Management: bedrest  Pressure Reduction Techniques: frequent weight shift encouraged  Pressure Reduction Devices: pressure-redistributing mattress utilized  Taken 08/24/2023 0800 by Dian Situ, RN  Activity Management: bedrest  Pressure Reduction Techniques: frequent weight shift encouraged  Pressure Reduction Devices: pressure-redistributing mattress utilized     Problem: Adult Inpatient Plan of Care  Goal: Plan of Care Review  Outcome: Progressing  Goal: Patient-Specific Goal (Individualized)  Outcome: Progressing  Goal: Absence of Hospital-Acquired Illness or Injury  Outcome: Progressing  Intervention: Identify and Manage Fall Risk  Recent Flowsheet Documentation  Taken 08/24/2023 1200 by Dian Situ, RN  Safety Interventions:   bed alarm   fall reduction program maintained   low bed  Taken 08/24/2023 1000 by Dian Situ, RN  Safety Interventions:   fall reduction program maintained   low bed  Taken 08/24/2023 0800 by Dian Situ, RN  Safety Interventions:   fall reduction program maintained   low bed  Intervention: Prevent Skin Injury  Recent Flowsheet Documentation  Taken 08/24/2023 1200 by Dian Situ, RN  Positioning for Skin: Bed in Chair  Taken 08/24/2023 1000 by Dian Situ, RN  Positioning for Skin: Bed in Chair  Taken 08/24/2023 0800 by Dian Situ, RN  Positioning for Skin: Supine/Back  Goal: Optimal Comfort and Wellbeing  Outcome: Progressing  Goal: Readiness for Transition of Care  Outcome: Progressing  Goal: Rounds/Family Conference  Outcome: Progressing     Problem: Fall Injury Risk  Goal: Absence of Fall and Fall-Related Injury  Outcome: Progressing  Intervention: Promote Scientist, clinical (histocompatibility and immunogenetics) Documentation  Taken 08/24/2023 1200 by Dian Situ, RN  Safety Interventions:   bed alarm   fall reduction program maintained   low bed  Taken 08/24/2023 1000 by Dian Situ, RN  Safety Interventions:   fall reduction program maintained   low bed  Taken 08/24/2023 0800 by Dian Situ, RN  Safety Interventions:   fall reduction program maintained   low bed     Problem: Latex Allergy  Goal: Absence of Allergy Symptoms  Outcome: Progressing     Problem: Self-Care Deficit  Goal: Improved Ability to Complete Activities of Daily Living  Outcome: Progressing     Problem: Pain Acute  Goal: Optimal Pain Control and Function  Outcome: Progressing

## 2023-08-24 NOTE — Unmapped (Signed)
Problem: Skin Injury Risk Increased  Goal: Skin Health and Integrity  Outcome: Progressing  Problem: Adult Inpatient Plan of Care  Goal: Plan of Care Review  Outcome: Progressing  Goal: Patient-Specific Goal (Individualized)  Outcome: Progressing  Goal: Absence of Hospital-Acquired Illness or Injury  Outcome: Progressing  Goal: Optimal Comfort and Wellbeing  Outcome: Progressing  Goal: Readiness for Transition of Care  Outcome: Progressing  Goal: Rounds/Family Conference  Outcome: Progressing     Problem: Fall Injury Risk  Goal: Absence of Fall and Fall-Related Injury  Outcome: Progressing   Problem: Latex Allergy  Goal: Absence of Allergy Symptoms  Outcome: Progressing     Problem: Self-Care Deficit  Goal: Improved Ability to Complete Activities of Daily Living  Outcome: Progressing     Problem: Pain Acute  Goal: Optimal Pain Control and Function  Outcome: Progressing

## 2023-08-24 NOTE — Unmapped (Signed)
Vascular Surgery Consult Note    Requesting Attending Physician:  Delanna Ahmadi, MD  Service Requesting Consult:  [facility])  Service Providing Consult: Vascular Surgery  Consulting Attending: Dr. Coralee Rud    Assessment: Heidi Blankenship is a 77 y.o. female with a PMH of DDKT in 2015, T2DM, HTN, PAD s/p multiple interventions including bilateral CIA stents (OSH), and chronic low back pain s/p L2-4 laminectomy in 10/2020 who presented to [facility] on 08/22/23 with pain from multiple closed fractures of the pelvis with delayed healing. Imaging reveals no evidence of any inflow disease and patent bilateral iliac stents without concern for any vascular involvement. Furthermore, stents look appropriate on x-ray without fracture or migration.       PLAN:   - No vascular reason for ongoing pain from pelvic fractures as she has no inflow disease and stents appear well positioned.   - Vascular surgery will sign off. We have discussed with the patient and she will continue to follow up with her primary Vascular surgeon in the outpatient setting for ongoing care and surveillance. Please page with additional questions or concerns.     If you have any questions, concerns or changes in the patient's clinical status, please feel free to contact [facility] consult pager [phone]. Thank you for this interesting consult.    Gerrie Nordmann, MD  PGY-5 General Surgery Resident   Pager: [phone]     History of Present Illness:   Chief Complaint:  pelvic pain    Heidi Blankenship is a 77 y.o. female who is seen in consultation for pelvic pain at the request of Delanna Ahmadi, MD on the [facility]) service.     Heidi Blankenship is a 77 y.o. female with a PMH of DDKT in 2015, T2DM, HTN, PAD s/p multiple interventions including bilateral CIA stents (OSH), and chronic low back pain s/p L2-4 laminectomy in 10/2020 who presented to [facility] on 08/22/23 with pain from multiple closed fractures of the pelvis with delayed healing.     She states that she initially fell and broke her pelvis in May and she has had worsening pain over the last several months. Prior to her fall, she had chronic lower back and bilateral hip pain which started three years ago which she attributes to her lower back disease and prior spine surgery. She denies any change in sensation or motor function in the leg.       Past Medical History:   Past Medical History:   Diagnosis Date    Arthritis Oct. 2020    Pain & swelling. 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)     History of nonmelanoma skin cancer 05/31/2015    Kidney transplant recipient     Lymphedema     RUL and RLL    PAD (peripheral artery disease) (CMS-HCC)     Bilateral iliac stents, right femoral and popliteal stents    SDH (subdural hematoma) (CMS-HCC)     After fall 2022, did not require surgery       Past Surgical History:  Past Surgical History:   Procedure Laterality Date    PR LIGATN ANGIOACCESS AV FISTULA Right 05/11/2015    Procedure: LIGATION OR  BANDING OF ANGIOACCESS ARTERIOVENOUS FISTULA;  Surgeon: Purcell Mouton, MD;  Location: MAIN OR [facility];  Service: Transplant    PR TRANSPLANT,PREP CADAVER RENAL GRAFT N/A 10/20/2014    Procedure: [facility] STD PREP CAD DONR RENAL ALLOGFT PRIOR TO TRNSPLNT, INCL DISSEC/REM PERINEPH FAT, DIAPH/RTPER ATTAC;  Surgeon: Purcell Mouton, MD;  Location: MAIN OR [place];  Service: Transplant    PR TRANSPLANTATION OF KIDNEY N/A 10/20/2014    Procedure: RENAL ALLOTRANSPLANTATION, IMPLANTATION OF GRAFT; WITHOUT RECIPIENT NEPHRECTOMY;  Surgeon: Purcell Mouton, MD;  Location: MAIN OR [place];  Service: Transplant    SKIN BIOPSY      TONSILLECTOMY  1953    TUBAL LIGATION  1985       Medications:  No current facility-administered medications on file prior to encounter.     Current Outpatient Medications on File Prior to Encounter   Medication Sig Dispense Refill    atorvastatin (LIPITOR) 20 MG tablet Take 1 tablet (20 mg total) by mouth daily. 30 tablet 11    cholecalciferol, vitamin D3, (VITAMIN D3) 2,000 unit Tab Take 1 tablet by mouth daily. 90 each 5    clopidogreL (PLAVIX) 75 mg tablet Take 1 tablet (75 mg total) by mouth daily.      levothyroxine (SYNTHROID) 100 MCG tablet TAKE 1 TABLET BY MOUTH EVERY DAY (Patient taking differently: Take 1 tablet (100 mcg total) by mouth. Taking 100 mcg daily x 6 days/week.) 90 tablet 11    lisinopril (PRINIVIL,ZESTRIL) 10 MG tablet Take 1 tablet (10 mg total) by mouth daily. 90 tablet 3    LUTEIN ORAL Take 1 tablet by mouth daily.      NOVOLIN N NPH U-100 INSULIN 100 unit/mL injection 0.25-0.28 mL (25-28 Units total) daily. Takes 25 to 28 units in am depending on sugar      NOVOLIN R REGULAR U100 INSULIN 100 unit/mL injection Inject 0.07-0.1 mL (7-10 Units total) under the skin two (2) times a day. Takes 3 pm and again near 6 pm if needed      omeprazole (PRILOSEC) 40 MG capsule Take 1 capsule (40 mg total) by mouth every other day.      phytonadione, vit K1, (VITAMIN K ORAL) Take 1 tablet by mouth in the morning.      predniSONE (DELTASONE) 5 MG tablet Take 1 tablet (5 mg total) by mouth daily. 30 tablet 11    sirolimus (RAPAMUNE) 0.5 mg tablet Take 2 tablets (1 mg total) by mouth in the morning. 180 tablet 3    tacrolimus (PROGRAF) 0.5 MG capsule TAKE 3 CAPSULES (1.5 MG) IN THE MORNING AND 2 CAPSULES (1 MG) IN THE EVENING 150 capsule 6    ZINC ORAL Take 1 tablet by mouth daily.      acetaminophen (TYLENOL 8 HOUR) 650 MG CR tablet Take 2 tablets (1,300 mg total) by mouth.      lidocaine (LIDODERM) 5 % patch Place 1 patch on the skin daily. Apply to affected area for 12 hours only each day (then remove patch)         Allergies:  Allergies   Allergen Reactions    Diclofenac Sodium Diarrhea and Nausea And Vomiting    Latex Itching    Penicillins Rash    Percocet [Oxycodone-Acetaminophen] Nausea And Vomiting       Family History:  Family History   Problem Relation Age of Onset    Multiple myeloma Mother     Diabetes Father     Diabetes Sister  Melanoma Neg Hx     Basal cell carcinoma Neg Hx     Squamous cell carcinoma Neg Hx        Social History:   Social History     Tobacco Use    Smoking status: Former     Current packs/day: 0.00     Average packs/day: 0.5 packs/day for 35.0 years (17.5 ttl pk-yrs)     Types: Cigarettes     Start date: 10/21/1979     Quit date: 10/20/2014     Years since quitting: 8.8    Smokeless tobacco: Never    Tobacco comments:     Off and on use   Vaping Use    Vaping status: Never Used   Substance Use Topics    Alcohol use: Not Currently    Drug use: Never       Review of Systems  10 systems were reviewed and are negative except as noted specifically in the HPI.    Objective  Vitals:   Temp:  [36.3 ??C (97.3 ??F)-36.7 ??C (98.1 ??F)] 36.7 ??C (98.1 ??F)  Heart Rate:  [58-62] 62  Resp:  [16-17] 16  BP: (109-135)/(49-55) 129/49  MAP (mmHg):  [68-79] 73  SpO2:  [96 %-97 %] 96 %  BMI (Calculated):  [26.97] 26.97      Intake/Output last 24 hours:  No intake or output data in the 24 hours ending 08/24/23 1315    Physical Exam:    General: Well appearing, well-nourished, resting comfortably in no acute distress  Eyes: Sclera anicteric, EOM intact  ENT: Nares without discharge, moist mucous membranes, trachea midline   Cardiac: Regular rate and rhythm  Pulmonary: Non labored breathing, stable on room air  Abdomen: Soft, non-tender, non distended  Extremities: Warm and well perfused, no edema bilaterally  Neuro: Alert and oriented x3    Pertinent Diagnostic Tests:  All lab results last 24 hours:    Recent Results (from the past 24 hour(s))   POCT Glucose    Collection Time: 08/23/23  5:09 PM   Result Value Ref Range    Glucose, POC 194 (H) 70 - 179 mg/dL   POCT Glucose    Collection Time: 08/23/23  7:27 PM   Result Value Ref Range    Glucose, POC 197 (H) 70 - 179 mg/dL   POCT Glucose    Collection Time: 08/24/23  7:29 AM   Result Value Ref Range    Glucose, POC 123 70 - 179 mg/dL   CBC    Collection Time: 08/24/23  7:43 AM   Result Value Ref Range    WBC 8.7 3.6 - 11.2 10*9/L    RBC 4.20 3.95 - 5.13 10*12/L    HGB 12.8 11.3 - 14.9 g/dL    HCT 95.6 21.3 - 08.6 %    MCV 92.5 77.6 - 95.7 fL    MCH 30.4 25.9 - 32.4 pg    MCHC 32.8 32.0 - 36.0 g/dL    RDW 57.8 (H) 46.9 - 15.2 %    MPV 7.4 6.8 - 10.7 fL    Platelet 676 (H) 150 - 450 10*9/L   Basic metabolic panel    Collection Time: 08/24/23  7:43 AM   Result Value Ref Range    Sodium 135 135 - 145 mmol/L    Potassium 5.0 (H) 3.4 - 4.8 mmol/L    Chloride 105 98 - 107 mmol/L    CO2 24.0 20.0 - 31.0 mmol/L    Anion Gap  6 5 - 14 mmol/L    BUN 22 9 - 23 mg/dL    Creatinine 2.95 (H) 0.55 - 1.02 mg/dL    BUN/Creatinine Ratio 21     eGFR CKD-EPI (2021) Female 56 (L) >=60 mL/min/1.57m2    Glucose 121 70 - 179 mg/dL    Calcium 8.9 8.7 - 62.1 mg/dL   Sirolimus Level    Collection Time: 08/24/23  7:43 AM   Result Value Ref Range    Sirolimus Level 3.6 3.0 - 20.0 ng/mL   Tacrolimus Level, Trough    Collection Time: 08/24/23  7:43 AM   Result Value Ref Range    Tacrolimus, Trough 2.5 (L) 5.0 - 15.0 ng/mL   POCT Glucose    Collection Time: 08/24/23 11:36 AM   Result Value Ref Range    Glucose, POC 126 70 - 179 mg/dL       Imaging:  PVL Arterial Duplex Lower Extremity Bilateral Limited    Result Date: 08/23/2023    Peripheral Vascular Lab     [address]   [place], [place] [address]  PVL ARTERIAL DUPLEX LOWER EXTREMITY BILATERAL LIMITED Patient Demographics Pt. Name: NICKOLETTE MILNOR Location: Emergency Department MRN:      [id number]     Sex:      F DOB:      September 09, 1946   Age:      69 years  Study Information Authorizing         [id number] Cheryle Horsfall Performed Time       08/22/2023 9:02:00 Provider Name                                                  PM Ordering Physician  Cheryle Horsfall        Patient Location     [facility] Accession Number [id number] UN         Technologist         Neita Garnet Diagnosis:                                Assisting                                           Technologist Ordered Reason For Exam: evaluation of BL iliac stents, left leg pain/groin pain  High Risk Factors: Non insulin dependent diabetes mellitus. Other Factors: H/o RLQ transplant kidney.  Vascular Interventions: H/o BLE stents at OSH.  Final Interpretation  Right  No significant obstruction detected by duplex scan.  RT great toe pressure = 108 mmHg.  Left Arterial obstruction involving the SFA and/or popliteal artery.  LT Great toe pressure = 54 mmHg.  Electronically signed by [id number] Jodell Cipro MD on 08/23/2023 at 8:27:40 AM. Examination Protocol: Systolic pressures are obtained brachial arteries bilaterally, and from the indicated ankle at the anterior tibial, and posterior tibial arteries. Ankle/brachial indices (ABIs) are calculated by dividing the ankle pressure obtained in each vessel by the higher brachial pressure. Physiologic waveforms are obtained and recorded. Toe PPG pressures/waveforms were obtained. Duplex scanning was selectively utilized to scan segments of one or both extremities. Images were recorded to document color and PW spectral Doppler analysis  as well as B-mode characteristics.  Arm Pressures 136 mmHgRight  Right ABIs  RT TBI 0.79 Great Toe 108 mmHg  Left ABIs  LT TBI 0.40 Great Toe 54 mmHg  Arterial wall calcification precludes accurate ankle pressures and ABIs. PPG tracings display appropriate pulsatility.  Right Summary Inflow: No evidence of hemodynamically significant inflow obstruction at rest. Outflow: No evidence of hemodynamically significant outflow obstruction at rest. Runoff: No evidence of hemodynamically significant runoff obstruction at rest.  Right Findings Peak Systolic Velocity 56 cm/s CFA 35 cm/s PFA 52 cm/s SFA prox 78 cm/s Pop A 83 cm/s ATA 73 cm/s PTA CFA acceleration time is 111 msec.  Left Summary Inflow: No evidence of hemodynamically significant inflow obstruction at rest. Outflow: Evidence of >75% stenosis in the popliteal artery by velocity ratio. Runoff: Unable to detect evidence of runoff disease. However, severity of proximal disease may limit the detection of distal disease. Left Findings: Peak Systolic Velocity 77 cm/s CFA 52 cm/s SFA dist 300 cm/s Pop A 56 cm/s ATA 48 cm/s PTA  CFA acceleration time is 89 msec.  A focal velocity elevation of 300 cm/s was obtained at popliteal artery with a VR of 6.5. Findings are characteristic of a >75% stenosis.   Final     MRI Pelvis Wo Contrast MSK    Result Date: 08/23/2023  EXAM: MRI PELVIS WO CONTRAST MSK DATE: 08/22/2023 8:27 PM ACCESSION: [id number] UN DICTATED: 08/22/2023 8:29 PM INTERPRETATION LOCATION: MAIN CAMPUS CLINICAL INDICATION: 76 years old Female with right groin pain (R>L), extends to the mid upper right thigh  COMPARISON: Multiple prior pelvis radiographs, CT 05/17/2023. TECHNIQUE: MRI of the pelvis was performed using a local coil.  Multisequence, multiplanar images were obtained without contrast.  FINDINGS:  Evaluation of the bone marrow reveals increased T2/STIR hyperintensity throughout both sacral ala and within the sacral promontory. There is a T1 dark and T2 bright 1.7 cm lesion within the right femoral neck that has well-defined margins and a narrow zone of transition that most likely represents a cyst or low-grade chondroid lesion. There is no fracture line or edema associated with this right femoral neck lesion. T2/STIR hyperintensity within the left pubis. Fracture line and marrow edema within the left superior and inferior pubic rami, minimally displaced. Intramuscular edema involving the left pelvic musculature including the gluteus minimus, iliacus, obturator internus, and proximal adductors. Bilateral hamstring origin tendinosis.  No evidence of bursitis. The sacroiliac joints and hip joints are approximated on the large field-of-view images. The sciatic nerves are normal in course and contour. Right lower quadrant transplant kidney appears unremarkable. Multilevel degenerative changes of the spine.     -Insufficiency fractures of the bilateral sacral ala and sacral promontory as well as the left pubic tubercle with robust marrow edema suggesting an acute to subacute process. -Minimally displaced fractures of the left superior and inferior pubic rami which appear acute to subacute. ==================== MODIFIED REPORT: (08/23/2023 6:43 AM) This report has been modified from its preliminary version; you may check the prior versions of radiology report, results history link for prior report versions (if they were previously visible in Epic). -----------------------------------------------    XR Pelvis 3 Or More Views    Result Date: 08/23/2023  EXAM: XR PELVIS 3 OR MORE VIEWS DATE: 08/23/2023 12:55 AM ACCESSION: [id number] UN DICTATED: 08/23/2023 2:52 AM INTERPRETATION LOCATION: Main Campus CLINICAL INDICATION: 77 years old Female with fx    COMPARISON: MRI pelvis 08/22/2023 and prior studies TECHNIQUE: AP, inlet, and outlet views of the pelvis.  FINDINGS:  No new fractures. Redemonstrated mildly displaced fracture of the left superior pubic ramus. Nondisplaced left inferior pubic ramus fracture. Left greater than right sclerosis along the bilateral SI joints, compatible with bilateral sacral insufficiency fractures visualized on prior MRI. Approximation of the bilateral hip joints and the pubic symphysis. Diffuse osteopenia. Multiple surgical clips overlying the right pelvis. Redemonstrated biiliac stents.     No new fractures. Redemonstrated mildly displaced fracture of the left superior pubic ramus. Nondisplaced left inferior pubic ramus fracture. Left greater than right sclerosis along the bilateral SI joints, compatible with bilateral sacral insufficiency fractures.

## 2023-08-24 NOTE — Unmapped (Signed)
Endocrinology Fracture Liaison Service Consult Note      Consult information:  Requesting Attending Physician : Delanna Ahmadi, MD  Service Requesting Consult : Med Bern[facility]in [facility])  Primary Care Provider: Jaclyn Shaggy, MD  Impression:  Heidi Blankenship is a 77 y.o. female admitted after presenting with ongoing pain after a fall several months ago and found to have pelvic insufficiency fractures. We have been consulted at the request of Delanna Ahmadi, MD to evaluate [name] for osteoporosis.    Medical Decision Making:  Diagnoses:  Osteoporosis.  Pelvic insufficiency fractures  History of kidney transplant on immunosuppression  Hypothyroidism on levothyroxine  Type 2 diabetes with last A1c 5.2%    Studies reviewed 08/24/23:  Labs: CBC, CMP, HbA1C, TSH, and phosphorus, vitamin D  Interpretation:   -CBC without anemia.  -CKD 3A with last creatinine 1.03 and GFR 56  -Calcium typically normal with rare low values on has had previous hypoalbuminemia.  Most recently 8.9.  -Phosphorus previously normal, last 04/2023  -Alkaline phosphatase typically normal although was mildly elevated in 06/2023.  Here normal at 104.  -TSH last normal in 2022  -A1c 5.2%  -Vitamin D 42.4 in 11/2022  -PTH previously normal but >80 in 11/2022    Notes reviewed: Primary team and nursing notes    Overall impression based on above reviews and history:  Patient is a 77 year old female who presents after a fall in May which resulted in right sacral fracture with imaging also notable for age-indeterminate L2 right transverse fracture and L1 compression deformity.  MRI here notable for insufficiency fractures of the bilateral sacral ala, sacral promontory and the pubic tubercle as well as minimally displaced fractures of the left superior and inferior pubic rami.  She has not had a DEXA scan since 2016 however even at that time had severe osteoporosis based on -3.3 T-score in her left femoral neck.    She has multiple risk factors for osteoporosis and fracture including age, sex, postmenopausal, kidney transplant and immunosuppressive use including steroids.  She is also at a higher risk of falling in the setting of insulin use and has had known severe hypoglycemia in the past.  We discussed a CGM today however she is not interested in this.    Overall, would benefit greatly from bone directed therapy.  In the setting of multiple fragility fractures, previous T-score less than -3 and significant risk factors for further fractures, an anabolic agents should be considered.  She does have known CAD with CT imaging showing heavy coronary artery calcifications in 04/2023 which would make me hesitant to treat with Evenity.  The other anabolic option is PTH analogues although does have a history of elevated PTH in the past and ?kidney stones. Reclast and Prolia are also always options if anabolic agents are deferred. She is in agreement to follow-up with [place] in clinic to discuss further. We will arrange.     Recommendations:  - Labs to be collected: 25-hydroxyvitamin D level, PTH  - DEXA scan to be ordered on discharge  - There is suspicion for more severe osteoporosis, so patient will follow up with endocrine in clinic to consider zoledronic acid, denosumab, or anabolic therapy.   - As allowed by surgery team for post fracture treatment, recommend regular weight-bearing and muscle-strengthening exercise.  - Counseled to eat a diet rich in fruits and vegetables to a total calcium intake of 1000 mg for men 50-70, 1200 mg for women above 51 and 1200 mg  for men above 60  - Vitamin D intake should be [phone] IU per day.  Target 25-hydroxy itamin D level > 30.    - Counseled to avoid smoking and alcohol  - Counseled on fall prevention and training (improve lighting, remove loose rugs, add bars near bathtubs, toilets, stairways, formal home safety evaluation, PT, eliminate sedating medications)    Thank you for this consult. Discussed plan with primary team. We will sign off.    Please page with questions or concerns: Endocrine fellow on call: [phone].    History of Present Illness: :      Heidi Blankenship is a 77 y.o. female with past medical history of type 2 diabetes, hypothyroidism, kidney transplant on immunosuppression, hypertension, peripheral arterial disease, CAD admitted for pelvic insufficiency fractures. We have been asked to evaluate [name] at the request of Delanna Ahmadi, MD to evaluate Heidi Blankenship for osteoporosis.      She had a fall in May from standing.  Had CT thoracic/lumbar spine as below which showed a sacral fracture as well as age-indeterminate L2 transverse process fracture and compression deformity at L1.  Had persistent pain with overall deconditioning which prompted her to come to the emergency department.  MRI on presentation notable for insufficiency fractures of the bilateral sacral ala and sacral promontory as well as left pubic tubercle.  Also noted to have minimally displaced fractures of the left superior and inferior pubic rami.    Other fractures history:   CT thoracic/lumbar spine 04/2023:  -Nondisplaced right sacral fracture with transverse component and cortical irregular component  -Age-indeterminate nonisplaced fracture of the right transverse process at L2  -Age-indeterminate but chronic appearing L1 compression deformity with associated height loss    X-ray left upper extremity 04/2023:  -Impacted acute distal left radial metaphyseal fracture with intra-articular extension  -Acute lunate fracture    She also reports a fracture in her bilateral lower extremities in 2004 after tripping over a rocking chair.    Fall history: Had frequent falls in April to October 2022 related to hypoglycemia.  Reports that hypoglycemia has resolved after initiating nighttime snacks.  She does not have a CGM nor does she want one.  Reports intact hypoglycemia awareness.    DEXA history:   2016  Spine (L1-L2): -1.2  Left total hip -2.4  Left femoral neck -3.3    Prior osteoporosis treatment: No    Age at menarche: 10-13  Menopause occurred at age 31.  They did not receive HRT.    Risk factors for fracture include: Age, sex, postmenopausal, kidney transplant, immunosuppression (tacrolimus, sirolimus, prednisone)    Vitamin D replacement: No.  Hx of kidney stones:  Possibly - reports previous imaging showed what looked like kidney stone in her left kidney however repeat showed that the mass had become encapsulated. She says she was told by her kidney doctor that they did not think that she had a kidney stone.   Hx of height loss: Yes.  About 3 inches.      Past Medical History:    Medical History:  Past Medical History:   Diagnosis Date    Arthritis Oct. 2020    Pain & swelling. 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)  History of nonmelanoma skin cancer 05/31/2015    Kidney transplant recipient     Lymphedema     RUL and RLL    PAD (peripheral artery disease) (CMS-HCC)     Bilateral iliac stents, right femoral and popliteal stents    SDH (subdural hematoma) (CMS-HCC)     After fall 2022, did not require surgery       Surgical History:  Past Surgical History:   Procedure Laterality Date    PR LIGATN ANGIOACCESS AV FISTULA Right 05/11/2015    Procedure: LIGATION OR BANDING OF ANGIOACCESS ARTERIOVENOUS FISTULA;  Surgeon: Purcell Mouton, MD;  Location: MAIN OR [place];  Service: Transplant    PR TRANSPLANT,PREP CADAVER RENAL GRAFT N/A 10/20/2014    Procedure: BACKBNCH STD PREP CAD DONR RENAL ALLOGFT PRIOR TO TRNSPLNT, INCL DISSEC/REM PERINEPH FAT, DIAPH/RTPER ATTAC;  Surgeon: Purcell Mouton, MD;  Location: MAIN OR [place];  Service: Transplant    PR TRANSPLANTATION OF KIDNEY N/A 10/20/2014    Procedure: RENAL ALLOTRANSPLANTATION, IMPLANTATION OF GRAFT; WITHOUT RECIPIENT NEPHRECTOMY; Surgeon: Purcell Mouton, MD;  Location: MAIN OR ;  Service: Transplant    SKIN BIOPSY      TONSILLECTOMY  1953    TUBAL LIGATION  1985       Allergies:  Diclofenac sodium, Latex, Penicillins, and Percocet [oxycodone-acetaminophen]    All Medications:   Current Facility-Administered Medications   Medication Dose Route Frequency Provider Last Rate Last Admin    acetaminophen (TYLENOL) tablet 1,000 mg  1,000 mg Oral Q8H Hemsey, Huntley Estelle, MD   1,000 mg at 08/24/23 0200    atorvastatin (LIPITOR) tablet 20 mg  20 mg Oral Daily Eppie Gibson, MD   20 mg at 08/23/23 2014    calcium carbonate (TUMS) chewable tablet 400 mg elem calcium  400 mg elem calcium Oral Daily PRN Earna Coder, MD        celecoxib (CeleBREX) capsule 100 mg  100 mg Oral BID Earna Coder, MD   100 mg at 08/24/23 4540    cholecalciferol (vitamin D3 25 mcg (1,000 units)) tablet 50 mcg  50 mcg Oral Daily Earna Coder, MD   50 mcg at 08/24/23 9811    clopidogrel (PLAVIX) tablet 75 mg  75 mg Oral Daily Eppie Gibson, MD   75 mg at 08/24/23 0839    dextrose (D10W) 10% bolus 125 mL  12.5 g Intravenous Q10 Min PRN Hemsey, Huntley Estelle, MD        enoxaparin (LOVENOX) syringe 40 mg  40 mg Subcutaneous Q24H Hemsey, Huntley Estelle, MD   40 mg at 08/23/23 2013    glucagon injection 1 mg  1 mg Intramuscular Once PRN Hemsey, Huntley Estelle, MD        glucose chewable tablet 16 g  16 g Oral Q10 Min PRN Hemsey, Huntley Estelle, MD        HYDROmorphone (DILAUDID) tablet 2 mg  2 mg Oral Q4H PRN Delanna Ahmadi, MD        Or    HYDROmorphone (DILAUDID) tablet 4 mg  4 mg Oral Q4H PRN Delanna Ahmadi, MD        HYDROmorphone (DILAUDID) tablet 4 mg  4 mg Oral Once Delanna Ahmadi, MD        insulin lispro (HumaLOG) injection 0-20 Units  0-20 Units Subcutaneous ACHS Hemsey, Huntley Estelle, MD   1 Units at 08/23/23 2000    insulin NPH (HumuLIN,NovoLIN) injection 25 Units  25 Units Subcutaneous Daily Hemsey,  Huntley Estelle, MD   25 Units at 08/24/23 [phone]    levothyroxine (SYNTHROID) tablet 100 mcg  100 mcg Oral daily Eppie Gibson, MD   100 mcg at 08/24/23 0532    lidocaine (ASPERCREME) 4 % 2 patch  2 patch Transdermal Daily Hemsey, Huntley Estelle, MD        lisinopril (PRINIVIL,ZESTRIL) tablet 10 mg  10 mg Oral Daily Eppie Gibson, MD   10 mg at 08/24/23 0839    ondansetron (ZOFRAN-ODT) disintegrating tablet 4 mg  4 mg Oral Q8H PRN Earna Coder, MD        pantoprazole (Protonix) EC tablet 40 mg  40 mg Oral Daily Eppie Gibson, MD   40 mg at 08/24/23 0839    polyethylene glycol (MIRALAX) packet 17 g  17 g Oral Daily PRN Delanna Ahmadi, MD        predniSONE (DELTASONE) tablet 5 mg  5 mg Oral Daily Eppie Gibson, MD   5 mg at 08/24/23 0839    [START ON 08/25/2023] psyllium (METAMUCIL) 3.4 gram packet 1 packet  1 packet Oral Daily Delanna Ahmadi, MD        senna [facility]) tablet 1 tablet  1 tablet Oral Nightly PRN Delanna Ahmadi, MD        sirolimus (RAPAMUNE) tablet 1 mg  1 mg Oral Daily Eppie Gibson, MD   1 mg at 08/24/23 9604    tacrolimus (PROGRAF) capsule 1 mg  1 mg Oral Nightly Eppie Gibson, MD   1 mg at 08/23/23 1958    tacrolimus (PROGRAF) capsule 1.5 mg  1.5 mg Oral Daily Eppie Gibson, MD   1.5 mg at 08/24/23 5409       Social History:  Social Connections: Not on file     Social History     Socioeconomic History    Marital status: Widowed     Spouse name: None    Number of children: None    Years of education: None    Highest education level: None   Tobacco Use    Smoking status: Former     Current packs/day: 0.00     Average packs/day: 0.5 packs/day for 35.0 years (17.5 ttl pk-yrs)     Types: Cigarettes     Start date: 10/21/1979     Quit date: 10/20/2014     Years since quitting: 8.8    Smokeless tobacco: Never    Tobacco comments:     Off and on use   Vaping Use    Vaping status: Never Used   Substance and Sexual Activity    Alcohol use: Not Currently    Drug use: Never    Sexual activity: Not Currently     Partners: Male     Birth control/protection: Post-menopausal   Other Topics Concern    Do you use sunscreen? Yes    Tanning bed use? No    Are you easily burned? No    Excessive sun exposure? No    Blistering sunburns? No         Family History:  family history includes Diabetes in her father and sister; Multiple myeloma in her mother..    Code Status:  Full Code    Review of Systems:  As noted in HPI.    Objective: :  Patient Vitals for the past 8 hrs:   BP Temp Temp src Pulse Resp SpO2   08/24/23 0732 129/49 36.7 ??C (98.1 ??F) Oral 62 16 96 %  Physical Exam:  Physical Exam  Constitutional:       General: She is not in acute distress.     Appearance: She is ill-appearing.   HENT:      Head: Normocephalic and atraumatic.   Eyes:      Extraocular Movements: Extraocular movements intact.      Conjunctiva/sclera: Conjunctivae normal.   Pulmonary:      Effort: Pulmonary effort is normal. No respiratory distress.   Musculoskeletal:      Comments: Right arm with significant edema compared to left   Neurological:      Mental Status: She is alert and oriented to person, place, and time. Mental status is at baseline.   Psychiatric:         Mood and Affect: Mood normal.         Behavior: Behavior normal.

## 2023-08-24 NOTE — Unmapped (Signed)
Tacrolimus/Sirolimus Therapeutic Monitoring Pharmacy Note    Heidi Blankenship is a 77 y.o. female continuing tacrolimus.     Indication: Kidney transplant     Date of Transplant:  10/20/14       Prior Dosing Information: Tacrolimus Home regimen 1.5 mg QAM and 1 mg QPM ; Sirolimus 1 mg daily     Source(s) of information used to determine prior to admission dosing: Clinic Note    Goals:  Therapeutic Drug Levels  Tacrolimus trough goal:  2-4 ng/ml  Sirolimus trough goal: 3-8 ng/ml    Additional Clinical Monitoring/Outcomes  Monitor renal function (SCr and urine output) and liver function (LFTs)  Monitor for signs/symptoms of adverse events (e.g., hyperglycemia, hyperkalemia, hypomagnesemia, hypertension, headache, tremor)    Results:   Tacrolimus level: Not applicable    Pharmacokinetic Considerations and Significant Drug Interactions:  Concurrent hepatotoxic medications: None identified  Concurrent CYP3A4 substrates/inhibitors: None identified  Concurrent nephrotoxic medications: None identified    Assessment/Plan:  Recommendedation(s)  Continue current regimen of tacrolimus 1.5 mg QAM and 1 mg QPM  Continue current regimen of sirolimus 1 mg daily    Follow-up  Next levels: Have been ordered for tomorrow (8/30)  A pharmacist will continue to monitor and recommend levels as appropriate    Please page service pharmacist with questions/clarifications.    Candise Che, PharmD

## 2023-08-24 NOTE — Unmapped (Signed)
Medicine Daily Progress Note    Assessment/Plan:  Principal Problem:    Multiple closed fractures of pelvis without disruption of pelvic ring with delayed healing  Active Problems:    Type II diabetes mellitus (CMS-HCC)    Immunosuppression (CMS-HCC)         Heidi Blankenship is a 77 y.o. female who is presenting to [facility] with Multiple closed fractures of pelvis without disruption of pelvic ring with delayed healing, in the setting of the following pertinent/contributing co-morbidities: History of kidney transplant, DM.    Multiple closed fractures of pelvis without disruption of pelvic ring with delayed healing   Patient with fall in May now with worsening pain over months with difficulty functioning at home over the last 10 days.  MRI shows insufficiency fractures of the bilateral sacral ala as well as the left pubic tubercle and superior and inferior pubic rami.  Has been seen by orthopedics.  Patient can be weightbearing as tolerated and no operative repair is recommended at this time.  In light of her worsening pain and difficulty functioning, will admit to observation for pain control and mobilization.  Was taking occasional oxycodone at home as well as using topical lidocaine.  In light of prolonged nature of her pain, may need to consider adjunctive agents as well.  - PT/OT evaluation -- patient wants to hold off until her pain is better controlled, hopefully tomorrow.  - Scheduled acetaminophen and lidocaine topical  - Oxycodone did not work for her --> Start dilaudid 2-4 mg po q4h PRN  - Celebrex BID  - Bowel regimen while on opiates     Secondary/Additional Active Problems:  Kidney transplant/immunosuppression:  Renal function appears near baseline.  Follows with Dr. Margaretmary Bayley at [facility].  On sirolimus, tacrolimus and daily prednisone.  -Will alert renal transplant of admission  -Continue current immunosuppression     Type II diabetes mellitus (CMS-HCC)  Uses an unusual regimen at home with NPH only once daily at 25 to 28 units.  Also uses regular insulin once to twice daily but only in the afternoon and evening.  Typically uses 7 to 10 units at 3 PM and then may repeat this based on sugars and intake at 6 PM.  She has found that deviations from this regimen have led to hypoglycemia or significant elevations.  -Check HbA1c  -Will continue NPH 25 units in the morning  -Will not give standing regular at this time  -Will give corrective scale and monitor sugars     HTN:  On low-dose lisinopril.  -Will continue     PAD/lymphedema:  History of right sided arterial stents as well as bilateral iliac stents.  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  Also has a RUE swelling of unclear etiology which has been more chronic.  -Continue clopidogrel     Prophylaxis  -enoxaparin     Diet  -Nutrition Therapy Regular/House     Code Status / HCDM  -  FULL, Discussed with patient at the time of admission   -  HCDM (patient stated preference): [name] - Sister - [phone]     HCDM (patient stated preference): Merton Border - Sister - [phone]    Dispo: Floor status, observation. CM working on SNF placement for rehab pending adequate pain control.    I personally spent 45 minutes face-to-face and non-face-to-face in the care of this patient, which includes all pre, intra, and post visit time on the date of service.  All documented time was specific to the E/M visit and does not include any procedures that may have been performed.  ___________________________________________________________________    Subjective:  The patient is awake, alert, and fairly comfortable when not moving but can't bear weight on her leg due to severe sharp pain in her left groin.    Labs/Studies:  Labs and Studies from the last 24hrs per EMR and Reviewed    Objective:  Temp:  [36.3 ??C (97.3 ??F)-36.7 ??C (98.1 ??F)] 36.7 ??C (98.1 ??F)  Heart Rate:  [58-62] 62  SpO2 Pulse:  [64] 64  Resp:  [16-18] 16  BP: (109-142)/(49-55) 129/49  SpO2:  [96 %-99 %] 96 %, BP 129/49  - Pulse 62  - Temp 36.7 ??C (98.1 ??F) (Oral)  - Resp 16  - Ht 157.5 cm (5' 2.01)  - Wt 66.9 kg (147 lb 7.8 oz)  - LMP  (LMP Unknown)  - SpO2 96%  - BMI 26.97 kg/m?? ,   Intake/Output Summary (Last 24 hours) at 08/24/2023 1157  Last data filed at 08/23/2023 1245  Gross per 24 hour   Intake 240 ml   Output --   Net 240 ml   , Body mass index is 26.97 kg/m??.,   Wt Readings from Last 3 Encounters:   08/23/23 66.9 kg (147 lb 7.8 oz)   05/17/23 75.3 kg (166 lb)   05/14/23 75.3 kg (166 lb)   ,   Hemodynamics        Date/Time Pulse BP MAP (mmHg) Arterial Line BP    08/24/23 0732 62  129/49  73  --      Date/Time Arterial Line MAP Arterial Line BP 2 Arterial Line MAP 2 Patient Position    08/24/23 0732 -- -- -- Lying                   General: Well appearing, alert, conversant, cooperative, and in no distress.  Eyes: No scleral icterus. Appropriate eye contact.  ENT: Normal appearing nose. Oropharyngeal mucus membranes moist & pink.  Cardiovascular: Normal rate, regular rhythm. No murmur.  Extremities: Warm to touch. Regular 2+ radial pulses bilaterally. No pitting edema; Right upper and lower extremity lymphedema  Respiratory: Normal respiratory effort on room air. Clear to auscultation bilaterally.  Gastrointestinal: Soft, non-tender, non-distended with normoactive bowel sounds.  GU: No foley  Neurologic: Alert and fully oriented. Normal speech and language. Moving all extremities purposefully. Unable to stand due to severe left groin pain with bearing weight. Pain with abduction of left hip. No pain in right leg.  Skin: Skin is warm, dry and intact. No rash.  Psychiatric: Upbeat mood; Appropriate affect and behavior.

## 2023-08-25 LAB — BASIC METABOLIC PANEL
ANION GAP: 8 mmol/L (ref 5–14)
BLOOD UREA NITROGEN: 27 mg/dL — ABNORMAL HIGH (ref 9–23)
BUN / CREAT RATIO: 30
CALCIUM: 8.9 mg/dL (ref 8.7–10.4)
CHLORIDE: 105 mmol/L (ref 98–107)
CO2: 26 mmol/L (ref 20.0–31.0)
CREATININE: 0.91 mg/dL
EGFR CKD-EPI (2021) FEMALE: 65 mL/min/{1.73_m2} (ref >=60)
GLUCOSE RANDOM: 76 mg/dL (ref 70–179)
POTASSIUM: 4.1 mmol/L (ref 3.4–4.8)
SODIUM: 139 mmol/L (ref 135–145)

## 2023-08-25 MED ADMIN — insulin NPH (HumuLIN,NovoLIN) injection 25 Units: 25 [IU] | SUBCUTANEOUS | @ 13:00:00

## 2023-08-25 MED ADMIN — enoxaparin (LOVENOX) syringe 40 mg: 40 mg | SUBCUTANEOUS | @ 01:00:00

## 2023-08-25 MED ADMIN — acetaminophen (TYLENOL) tablet 1,000 mg: 1000 mg | ORAL | @ 21:00:00

## 2023-08-25 MED ADMIN — sirolimus (RAPAMUNE) tablet 1 mg: 1 mg | ORAL | @ 13:00:00

## 2023-08-25 MED ADMIN — psyllium (METAMUCIL) 3.4 gram packet 1 packet: 1 | ORAL | @ 13:00:00

## 2023-08-25 MED ADMIN — atorvastatin (LIPITOR) tablet 20 mg: 20 mg | ORAL | @ 13:00:00

## 2023-08-25 MED ADMIN — pantoprazole (Protonix) EC tablet 40 mg: 40 mg | ORAL | @ 13:00:00

## 2023-08-25 MED ADMIN — tacrolimus (PROGRAF) capsule 1 mg: 1 mg | ORAL | @ 01:00:00

## 2023-08-25 MED ADMIN — acetaminophen (TYLENOL) tablet 1,000 mg: 1000 mg | ORAL | @ 13:00:00

## 2023-08-25 MED ADMIN — insulin lispro (HumaLOG) injection 0-20 Units: 0-20 [IU] | SUBCUTANEOUS | @ 21:00:00

## 2023-08-25 MED ADMIN — levothyroxine (SYNTHROID) tablet 100 mcg: 100 ug | ORAL | @ 10:00:00

## 2023-08-25 MED ADMIN — predniSONE (DELTASONE) tablet 5 mg: 5 mg | ORAL | @ 13:00:00

## 2023-08-25 MED ADMIN — sodium chloride 0.9% (NS) bolus 1,000 mL: 1000 mL | INTRAVENOUS | @ 15:00:00 | Stop: 2023-08-25

## 2023-08-25 MED ADMIN — clopidogrel (PLAVIX) tablet 75 mg: 75 mg | ORAL | @ 13:00:00

## 2023-08-25 MED ADMIN — celecoxib (CeleBREX) capsule 100 mg: 100 mg | ORAL | @ 13:00:00

## 2023-08-25 MED ADMIN — tacrolimus (PROGRAF) capsule 1.5 mg: 1.5 mg | ORAL | @ 13:00:00

## 2023-08-25 MED ADMIN — insulin lispro (HumaLOG) injection 0-20 Units: 0-20 [IU] | SUBCUTANEOUS | @ 03:00:00

## 2023-08-25 MED ADMIN — cholecalciferol (vitamin D3 25 mcg (1,000 units)) tablet 50 mcg: 50 ug | ORAL | @ 13:00:00

## 2023-08-25 MED ADMIN — HYDROmorphone (DILAUDID) tablet 2 mg: 2 mg | ORAL | @ 13:00:00 | Stop: 2023-09-07

## 2023-08-25 MED ADMIN — celecoxib (CeleBREX) capsule 100 mg: 100 mg | ORAL | @ 01:00:00

## 2023-08-25 NOTE — Unmapped (Signed)
Medicine Daily Progress Note    Assessment/Plan:  Principal Problem:    Multiple closed fractures of pelvis without disruption of pelvic ring with delayed healing  Active Problems:    Type II diabetes mellitus (CMS-HCC)    Immunosuppression (CMS-HCC)         Heidi Blankenship is a 77 y.o. female who is presenting to [facility] with Multiple closed fractures of pelvis without disruption of pelvic ring with delayed healing, in the setting of the following pertinent/contributing co-morbidities: History of kidney transplant, DM.    Multiple closed fractures of pelvis without disruption of pelvic ring with delayed healing   Patient with fall in May now with worsening pain over months with difficulty functioning at home over the last 10 days.  MRI shows insufficiency fractures of the bilateral sacral ala as well as the left pubic tubercle and superior and inferior pubic rami.  Has been seen by orthopedics.  Patient can be weightbearing as tolerated and no operative repair is recommended at this time.  In light of her worsening pain and difficulty functioning, will admit to observation for pain control and mobilization.  Was taking occasional oxycodone at home as well as using topical lidocaine.  In light of prolonged nature of her pain, may need to consider adjunctive agents as well.  - PT/OT evaluation -- patient wants to hold off until her pain is better controlled, hopefully tomorrow.  - Scheduled acetaminophen and lidocaine topical  - Continue Dilaudid 2-4 mg po q4h PRN (oxycodone didn't work well for her)  - Celebrex BID  - Bowel regimen while on opiates     Kidney transplant/immunosuppression:  Renal function appears near baseline.  Follows with Dr. Margaretmary Bayley at [facility] clinic.  On sirolimus, tacrolimus and daily prednisone. Cr mildly elevated at 1.03 on 08/24/23. Urine in the AM on 8/31 appears dark. She may be dehydrated due to not having gotten out of bed for 3 days prior to admission and only taking sips of fluids. She is now drinking well.  - Give 1 L IV NS on 8/31  - Repeat BMP on 8/31 pending  - Paged renal transplant to alert them of admission  - Continue current immunosuppression     Type II diabetes mellitus (CMS-HCC)  Uses an unusual regimen at home with NPH only once daily at 25 to 28 units.  Also uses regular insulin once to twice daily but only in the afternoon and evening.  Typically uses 7 to 10 units at 3 PM and then may repeat this based on sugars and intake at 6 PM.  She has found that deviations from this regimen have led to hypoglycemia or significant elevations. HbA1C 5.2% on 08/22/23.  - Continue NPH 25 units daily in the morning  - SSI     HTN:  - Holding lisinopril pending repeat Cr     PAD/lymphedema:  History of right sided arterial stents as well as bilateral iliac stents.  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  Also has a RUE swelling of unclear etiology which has been more chronic.  - Continue clopidogrel     Prophylaxis  - Enoxaparin     Diet  - Nutrition Therapy Regular/House     Code Status / HCDM  -  FULL, Discussed with patient at the time of admission   -  HCDM (patient stated preference): [name] - Sister - [phone]     HCDM (patient stated preference): Merton Border - Sister - [phone]  Dispo: Floor status, observation. CM working on SNF placement for rehab pending adequate pain control.    I personally spent 45 minutes face-to-face and non-face-to-face in the care of this patient, which includes all pre, intra, and post visit time on the date of service.  All documented time was specific to the E/M visit and does not include any procedures that may have been performed.  ___________________________________________________________________    Subjective:  The patient is awake, alert, and fairly comfortable when not moving (4-5/10 pain in left groin) but can't bear weight on her leg due to severe sharp pain in her left groin with standing. Sitting increases her pain as well but not as much as standing.    Labs/Studies:  Labs and Studies from the last 24hrs per EMR and Reviewed    Objective:  Temp:  [35.9 ??C (96.7 ??F)-37.1 ??C (98.8 ??F)] 36.7 ??C (98.1 ??F)  Heart Rate:  [69-82] 69  Resp:  [16-18] 16  BP: (113-126)/(47-59) 117/47  SpO2:  [94 %-96 %] 96 %, BP 117/47  - Pulse 69  - Temp 36.7 ??C (98.1 ??F) (Oral)  - Resp 16  - Ht 157.5 cm (5' 2.01)  - Wt 66.9 kg (147 lb 7.8 oz)  - LMP  (LMP Unknown)  - SpO2 96%  - BMI 26.97 kg/m?? ,   Intake/Output Summary (Last 24 hours) at 08/25/2023 1007  Last data filed at 08/25/2023 0925  Gross per 24 hour   Intake 180 ml   Output 350 ml   Net -170 ml   , Body mass index is 26.97 kg/m??.,   Wt Readings from Last 3 Encounters:   08/23/23 66.9 kg (147 lb 7.8 oz)   05/17/23 75.3 kg (166 lb)   05/14/23 75.3 kg (166 lb)   ,   Hemodynamics        Date/Time Pulse BP MAP (mmHg) Arterial Line BP    08/25/23 0845 69  117/47  64  --    08/25/23 0541 69  126/50  69  --      Date/Time Arterial Line MAP Arterial Line BP 2 Arterial Line MAP 2 Patient Position    08/25/23 0845 -- -- -- Lying     08/25/23 0541 -- -- -- Lying                   General: Well appearing, alert, conversant, cooperative, and in no distress.  Eyes: No scleral icterus. Appropriate eye contact.  ENT: Normal appearing nose. Oropharyngeal mucus membranes moist & pink.  Cardiovascular: Normal rate, regular rhythm. No murmur.  Extremities: Warm to touch. No pitting edema; Right upper and lower extremity lymphedema  Respiratory: Normal respiratory effort on room air. Clear to auscultation bilaterally.  Gastrointestinal: Soft, non-tender, non-distended  GU: No foley  Neurologic: Alert and fully oriented. Normal speech and language. Moving all extremities purposefully. Unable to stand due to severe left groin pain with bearing weight. Pain with abduction of left hip. No pain in right leg.  Skin: Skin is warm, dry and intact. No rash.  Psychiatric: Upbeat mood; Appropriate affect and behavior.

## 2023-08-25 NOTE — Unmapped (Incomplete)
Problem: Skin Injury Risk Increased  Goal: Skin Health and Integrity  Outcome: Ongoing - Unchanged  Intervention: Optimize Skin Protection  Recent Flowsheet Documentation  Taken 08/24/2023 2000 by Governor Rooks, RN  Head of Bed [facility]) Positioning: HOB at 30-45 degrees     Problem: Adult Inpatient Plan of Care  Goal: Plan of Care Review  Outcome: Ongoing - Unchanged  Goal: Patient-Specific Goal (Individualized)  Outcome: Ongoing - Unchanged  Goal: Absence of Hospital-Acquired Illness or Injury  Outcome: Ongoing - Unchanged  Intervention: Identify and Manage Fall Risk  Recent Flowsheet Documentation  Taken 08/24/2023 2000 by Governor Rooks, RN  Safety Interventions:   fall reduction program maintained   environmental modification  Intervention: Prevent and Manage VTE (Venous Thromboembolism) Risk  Recent Flowsheet Documentation  Taken 08/24/2023 2000 by Governor Rooks, RN  Anti-Embolism Intervention: (Lovenox) Other (Comment)  Goal: Optimal Comfort and Wellbeing  Outcome: Ongoing - Unchanged  Goal: Readiness for Transition of Care  Outcome: Ongoing - Unchanged  Goal: Rounds/Family Conference  Outcome: Ongoing - Unchanged     Problem: Fall Injury Risk  Goal: Absence of Fall and Fall-Related Injury  Outcome: Ongoing - Unchanged  Intervention: Promote Injury-Free Environment  Recent Flowsheet Documentation  Taken 08/24/2023 2000 by Governor Rooks, RN  Safety Interventions:   fall reduction program maintained   environmental modification     Problem: Latex Allergy  Goal: Absence of Allergy Symptoms  Outcome: Ongoing - Unchanged     Problem: Self-Care Deficit  Goal: Improved Ability to Complete Activities of Daily Living  Outcome: Ongoing - Unchanged     Problem: Pain Acute  Goal: Optimal Pain Control and Function  Outcome: Ongoing - Unchanged

## 2023-08-25 NOTE — Unmapped (Signed)
VENOUS ACCESS TEAM PROCEDURE    Nurse request was placed for a "PIV by Venous Access Team (VAT)".  Patient was assessed at bedside for placement of a PIV. PPE were donned per protocol.  Access was obtained. Blood return noted.  Dressing intact and device well secured.  Flushed with normal saline.  See LDA for details.  Pt advised to inform RN of any s/s of discomfort at the PIV site.    Workup / Procedure Time:  30 minutes       Care RN was notified.       Thank you,     Shyquan Stallbaumer, RN Venous Access Team

## 2023-08-26 LAB — BASIC METABOLIC PANEL
ANION GAP: 5 mmol/L (ref 5–14)
BLOOD UREA NITROGEN: 25 mg/dL — ABNORMAL HIGH (ref 9–23)
BUN / CREAT RATIO: 27
CALCIUM: 8.9 mg/dL (ref 8.7–10.4)
CHLORIDE: 111 mmol/L — ABNORMAL HIGH (ref 98–107)
CO2: 24 mmol/L (ref 20.0–31.0)
CREATININE: 0.92 mg/dL
EGFR CKD-EPI (2021) FEMALE: 64 mL/min/{1.73_m2} (ref >=60)
GLUCOSE RANDOM: 73 mg/dL (ref 70–179)
POTASSIUM: 4.1 mmol/L (ref 3.4–4.8)
SODIUM: 140 mmol/L (ref 135–145)

## 2023-08-26 LAB — TACROLIMUS LEVEL, TROUGH: TACROLIMUS, TROUGH: 3.8 ng/mL — ABNORMAL LOW (ref 5.0–15.0)

## 2023-08-26 LAB — SIROLIMUS LEVEL: SIROLIMUS LEVEL BLOOD: 3.7 ng/mL (ref 3.0–20.0)

## 2023-08-26 MED ADMIN — levothyroxine (SYNTHROID) tablet 100 mcg: 100 ug | ORAL | @ 10:00:00

## 2023-08-26 MED ADMIN — HYDROmorphone (DILAUDID) tablet 2 mg: 2 mg | ORAL | @ 17:00:00 | Stop: 2023-09-07

## 2023-08-26 MED ADMIN — celecoxib (CeleBREX) capsule 100 mg: 100 mg | ORAL

## 2023-08-26 MED ADMIN — lisinopril (PRINIVIL,ZESTRIL) tablet 10 mg: 10 mg | ORAL | @ 13:00:00

## 2023-08-26 MED ADMIN — atorvastatin (LIPITOR) tablet 20 mg: 20 mg | ORAL | @ 13:00:00

## 2023-08-26 MED ADMIN — sirolimus (RAPAMUNE) tablet 1 mg: 1 mg | ORAL | @ 13:00:00

## 2023-08-26 MED ADMIN — clopidogrel (PLAVIX) tablet 75 mg: 75 mg | ORAL | @ 13:00:00

## 2023-08-26 MED ADMIN — cholecalciferol (vitamin D3 25 mcg (1,000 units)) tablet 50 mcg: 50 ug | ORAL | @ 13:00:00

## 2023-08-26 MED ADMIN — tacrolimus (PROGRAF) capsule 1.5 mg: 1.5 mg | ORAL | @ 13:00:00

## 2023-08-26 MED ADMIN — psyllium (METAMUCIL) 3.4 gram packet 1 packet: 1 | ORAL | @ 13:00:00

## 2023-08-26 MED ADMIN — pantoprazole (Protonix) EC tablet 40 mg: 40 mg | ORAL | @ 13:00:00

## 2023-08-26 MED ADMIN — predniSONE (DELTASONE) tablet 5 mg: 5 mg | ORAL | @ 13:00:00

## 2023-08-26 MED ADMIN — insulin regular (HumuLIN,NovoLIN) injection 6 Units: 6 [IU] | SUBCUTANEOUS | @ 23:00:00

## 2023-08-26 MED ADMIN — tacrolimus (PROGRAF) capsule 1 mg: 1 mg | ORAL

## 2023-08-26 MED ADMIN — celecoxib (CeleBREX) capsule 100 mg: 100 mg | ORAL | @ 13:00:00 | Stop: 2023-08-26

## 2023-08-26 MED ADMIN — insulin NPH (HumuLIN,NovoLIN) injection 25 Units: 25 [IU] | SUBCUTANEOUS | @ 15:00:00

## 2023-08-26 NOTE — Unmapped (Signed)
Tacrolimus/Sirolimus Therapeutic Monitoring Pharmacy Note    Heidi Blankenship is a 77 y.o. female continuing tacrolimus.     Indication: Kidney transplant     Date of Transplant:  10/20/14       Prior Dosing Information: Home regimen Tacrolimus 1.5 mg QAM and 1 mg QPM; Sirolimus 1 mg daily                                                                      SEE BELOW FOR CURRENT INPATIENT DOSING / LEVELS    Source(s) of information used to determine prior to admission dosing: Clinic Note    Goals:  Therapeutic Drug Levels  Tacrolimus trough goal:  2-4 ng/ml  Sirolimus trough goal: 3-8 ng/ml    Additional Clinical Monitoring/Outcomes  Monitor renal function (SCr and urine output) and liver function (LFTs)  Monitor for signs/symptoms of adverse events (e.g., hyperglycemia, hyperkalemia, hypomagnesemia, hypertension, headache, tremor)    Results:   Tacrolimus level: Not applicable    Pharmacokinetic Considerations and Significant Drug Interactions:  Concurrent hepatotoxic medications: atorva 20mg  (HMed) Acetaminophen 1000mg  q8h (NEW)  Concurrent CYP3A4 substrates/inhibitors: None identified  Concurrent nephrotoxic medications: Celecoxib 100mg  bid(NEW), lisinopril 10mg  daily (HMed)    Sirolimus Dosing + Tacrolimus Dosing  Sirolimus 1mg  AM +  Tacro 1.5mg  AM and 1mg  PM      Sirolimus + Tacrolimus Levels  8/30: Siro = 3.6 ng/mL at 0743 + Tacro = 2.5 ng/mL at 0743  9/1: Siro = 3.7 at 0732 ng/mL + Tacro = 3.8 ng/mL at 0735      Assessment/Plan:  8/30 Both Sirolimus + tacrolimus - are within goal limits  8/30 Scr slight elevation (1.03) - 0.85 on adm (8/28)  9/1 Both sirolimus and tacrolimus are within goal limits    Recommendedation(s) unless otherwise indicated by Transplant Team  Continue --tacrolimus 1.5 mg QAM and 1 mg QPM  Continue --sirolimus 1 mg daily  Use caution with celecoxib (increase risk for nephrotoxicity, especially with tacro/sirolimus) - consider alternative therapy    Follow-up  Tacrolimus + Sirolimus Levels: MWF- with AM labs between 0600-0800 PRIOR to morning doses.  Doses due 0800  A pharmacist will continue to monitor and recommend levels as appropriate    Please page service pharmacist with questions/clarifications.    Tildon Husky, PharmD

## 2023-08-26 NOTE — Unmapped (Signed)
VSS.   Pt afebrile . With no s/s of infection noted.  No falls noted. Pt educated to call for assistance if needed..  . Pain  controlled by prn pain meds. DVT protocol in place.pt on lovenox. P Will continue to monitor.    Problem: Skin Injury Risk Increased  Goal: Skin Health and Integrity  Outcome: Progressing  Intervention: Optimize Skin Protection  Recent Flowsheet Documentation  Taken 08/25/2023 2052 by Darene Lamer, RN  Activity Management: bedrest  Pressure Reduction Techniques: frequent weight shift encouraged  Pressure Reduction Devices: pressure-redistributing mattress utilized  Skin Protection: adhesive use limited     Problem: Adult Inpatient Plan of Care  Goal: Plan of Care Review  Outcome: Progressing  Goal: Patient-Specific Goal (Individualized)  Outcome: Progressing  Goal: Absence of Hospital-Acquired Illness or Injury  Outcome: Progressing  Intervention: Identify and Manage Fall Risk  Recent Flowsheet Documentation  Taken 08/25/2023 2052 by Darene Lamer, RN  Safety Interventions:   fall reduction program maintained   low bed   nonskid shoes/slippers when out of bed  Intervention: Prevent Skin Injury  Recent Flowsheet Documentation  Taken 08/25/2023 2052 by Darene Lamer, RN  Positioning for Skin: Supine/Back  Skin Protection: adhesive use limited  Goal: Optimal Comfort and Wellbeing  Outcome: Progressing  Goal: Readiness for Transition of Care  Outcome: Progressing  Goal: Rounds/Family Conference  Outcome: Progressing     Problem: Fall Injury Risk  Goal: Absence of Fall and Fall-Related Injury  Outcome: Progressing  Intervention: Promote Injury-Free Environment  Recent Flowsheet Documentation  Taken 08/25/2023 2052 by Darene Lamer, RN  Safety Interventions:   fall reduction program maintained   low bed   nonskid shoes/slippers when out of bed     Problem: Latex Allergy  Goal: Absence of Allergy Symptoms  Outcome: Progressing     Problem: Self-Care Deficit  Goal: Improved Ability to Complete Activities of Daily Living  Outcome: Progressing     Problem: Pain Acute  Goal: Optimal Pain Control and Function  Outcome: Progressing

## 2023-08-26 NOTE — Unmapped (Signed)
Transplant Nephrology Consult     Requesting Attending Physician :  Delanna Ahmadi, MD  Service Requesting Consult : Med Bernita Raisin [facility])  Reason for Consult: kidney transplant    Assessment and Plan:   77yoF with hx of DDKT on 19/27/2015 h/w pelvic fractures    # S/p Kidney Transplant, Kidney allograft function:  - Serum creatinine level is 0.9 at baseline.   - no DSAs recently    # Immunosuppression:  - currently on sirolimus 1mg  q AM, tacrolimus 1.5/1mg , prednisone 5mg . Continue for now though may discuss switching off sirolimus for healing.  - Please obtain trough tacrolimus, sirolimus trough levels once a week prior to the morning dose of the medication.   - last tac goal of 2-4, last sirolimus goal of 3-8    # Blood Pressure / Volume:  - lisinopril 10mg  daily    # Infectious Prophylaxis and Monitoring:   - N/A  - EBV detected but unquantifiable 01/2023; neg CMV 7/12024    # hip fractures   - For wound healing, occasionally we switch off sirolimus -- will discuss  - Evaluation and management per primary team  - No changes to management from a nephrology standpoint at this time    RECOMMENDATIONS:   - will clarify sirolimus dosing for now continue sirolimus 1mg , tac 1.5/1mg , and prednisone 5mg   - Transplant patients with an open wound require wound care with sterile water only. The patient should be counseled on this at the time of discharge if they have not already been doing this.  - We will continue to follow.     Clyde Lundborg, MD  08/25/2023 11:27 PM     Medical decision-making for 08/25/23  Findings / Data     Patient has: []  acute illness w/systemic sxs  [mod]  []  two or more stable chronic illnesses [mod]  []  one chronic illness with acute exacerbation [mod]  []  acute complicated illness  [mod]  []  Undiagnosed new problem with uncertain prognosis  [mod] []  illness posing risk to life or bodily function (ex. AKI)  [high]  [x]  chronic illness with severe exacerbation/progression  [high]  []  chronic illness with severe side effects of treatment  [high] Hip fracture Probs At least 2:  Probs, Data, Risk   I reviewed: [x]  primary team note  []  consultant note(s)  []  external records [x]  chemistry results  []  CBC results  []  blood gas results  [x]  Other []  procedure/op note(s)   []  radiology report(s)  []  micro result(s)  []  w/ independent historian(s) Tac and sirolimus levels, Cr 0.9 ?3 Data Review (2 of 3)    I independently interpreted: []  Urine Sediment  []  Renal US []  CXR Images  []  CT Images  []  Other []  EKG Tracing  Any     I discussed: []  Pathology results w/ QHPs(s) from other specialties  []  Procedural findings w/ QHPs(s) from other specialties []  Imaging w/ QHP(s) from other specialties  [x]  Treatment plan w/ QHP(s) from other specialties Plan discussed with primary team Any     Mgm't requires: []  Prescription drug(s)  [mod]  []  Kidney biopsy  [mod]  []  Central line placement  [mod] [x]  High risk medication use and/or intensive toxicity monitoring [high]  []  Renal replacement therapy [high]  []  High risk kidney biopsy  [high]  []  Escalation of care  [high]  []  High risk central line placement  [high] Immunosuppression: high risk for infection Risk      _____________________________________________________________________________________  Transplant Background  Transplant Date: 10/20/2014 (Kidney)  Native Kidney Disease:Diabetes Mellitus - Type II,  ;   Induction therapy: thymoglobulin   KDPI: 49%  Cold ischemic time: 1,407 minutes ; Warm ischemic time:   minutes  cPRA: No results found for: CPRA  Blood type: Donor A1, Recipient A POS  Infectious Disease: CMV D Positive/ R+ , EBV DPositive /R+  Rejection episodes: none    History of Present Illness:  Heidi Blankenship is a/an 77 y.o. 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 Nephrology has been consulted for transplant management.     Initially fell in May but difficulty with wound healing since then and ongoing pain. No operative repair recommended. Admitted for pain control    Has upcoming appointment with Dr. Margaretmary Bayley but wondering if needs labs as well.     Takes 1.5/1mg  of tac and 1mg  of sirolimus as well as 5mg  of prednisone    Denies chest pain, SOB, notes rare diarrhea, no fevers. Good PO intake. R arm is swollen at site of prior IV    INPATIENT MEDICATIONS:    Current Facility-Administered Medications:     acetaminophen (TYLENOL) tablet 1,000 mg, Oral, Q8H    atorvastatin (LIPITOR) tablet 20 mg, Oral, Daily    calcium carbonate (TUMS) chewable tablet 400 mg elem calcium, Oral, Daily PRN    celecoxib (CeleBREX) capsule 100 mg, Oral, BID    cholecalciferol (vitamin D3 25 mcg (1,000 units)) tablet 50 mcg, Oral, Daily    clopidogrel (PLAVIX) tablet 75 mg, Oral, Daily    dextrose (D10W) 10% bolus 125 mL, Intravenous, Q10 Min PRN    enoxaparin (LOVENOX) syringe 40 mg, Subcutaneous, Q24H    glucagon injection 1 mg, Intramuscular, Once PRN    glucose chewable tablet 16 g, Oral, Q10 Min PRN    HYDROmorphone (DILAUDID) tablet 2 mg, Oral, Q4H PRN **OR** HYDROmorphone (DILAUDID) tablet 4 mg, Oral, Q4H PRN    insulin lispro (HumaLOG) injection 0-20 Units, Subcutaneous, ACHS    insulin NPH (HumuLIN,NovoLIN) injection 25 Units, Subcutaneous, Daily    insulin regular (HumuLIN,NovoLIN) injection 6 Units, Subcutaneous, Daily before dinner    levothyroxine (SYNTHROID) tablet 100 mcg, Oral, daily    lidocaine (ASPERCREME) 4 % 2 patch, Transdermal, Daily    lisinopril (PRINIVIL,ZESTRIL) tablet 10 mg, Oral, Daily    ondansetron (ZOFRAN-ODT) disintegrating tablet 4 mg, Oral, Q8H PRN    pantoprazole (Protonix) EC tablet 40 mg, Oral, Daily    polyethylene glycol (MIRALAX) packet 17 g, Oral, Daily PRN    predniSONE (DELTASONE) tablet 5 mg, Oral, Daily    psyllium (METAMUCIL) 3.4 gram packet 1 packet, Oral, Daily    senna (SENOKOT) tablet 1 tablet, Oral, Nightly PRN    sirolimus (RAPAMUNE) tablet 1 mg, Oral, Daily    tacrolimus (PROGRAF) capsule 1 mg, Oral, Nightly    tacrolimus (PROGRAF) capsule 1.5 mg, Oral, Daily  OUTPATIENT MEDICATIONS:  Prior to Admission medications    Medication Dose, Route, Frequency   atorvastatin (LIPITOR) 20 MG tablet 20 mg, Oral, Daily (standard)   cholecalciferol, vitamin D3, (VITAMIN D3) 2,000 unit Tab 1 tablet, Oral, Daily (standard)   clopidogreL (PLAVIX) 75 mg tablet 75 mg, Oral, Daily (standard)   levothyroxine (SYNTHROID) 100 MCG tablet TAKE 1 TABLET BY MOUTH EVERY DAY  Patient taking differently: Take 1 tablet (100 mcg  total) by mouth. Taking 100 mcg daily x 6 days/week.   lisinopril (PRINIVIL,ZESTRIL) 10 MG tablet 10 mg, Oral, Daily (standard)   LUTEIN ORAL 1 tablet, Oral, Daily (standard)   NOVOLIN N NPH U-100 INSULIN 100 unit/mL injection 0.25-0.28 mL (25-28 Units total) daily. Takes 25 to 28 units in am depending on sugar   NOVOLIN R REGULAR U100 INSULIN 100 unit/mL injection Inject 0.07-0.1 mL (7-10 Units total) under the skin two (2) times a day. Takes 3 pm and again near 6 pm if needed   omeprazole (PRILOSEC) 40 MG capsule 40 mg, Oral, Every other day   phytonadione, vit K1, (VITAMIN K ORAL) 1 tablet, Oral, Daily   predniSONE (DELTASONE) 5 MG tablet 5 mg, Oral, Daily (standard)   sirolimus (RAPAMUNE) 0.5 mg tablet 1 mg, Oral, Daily   tacrolimus (PROGRAF) 0.5 MG capsule TAKE 3 CAPSULES (1.5 MG) IN THE MORNING AND 2 CAPSULES (1 MG) IN THE EVENING   ZINC ORAL 1 tablet, Oral, Daily (standard)   acetaminophen (TYLENOL 8 HOUR) 650 MG CR tablet 1,300 mg, Oral   lidocaine (LIDODERM) 5 % patch 1 patch, Transdermal, Every 24 hours, Apply to affected area for 12 hours only each day (then remove patch)      ALLERGIES:  Diclofenac sodium, Latex, Penicillins, and Percocet [oxycodone-acetaminophen]  MEDICAL HISTORY:  Past Medical History:   Diagnosis Date    Arthritis Oct. 2020 Pain & swelling. 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)     History of nonmelanoma skin cancer 05/31/2015    Kidney transplant recipient     Lymphedema     RUL and RLL    PAD (peripheral artery disease) (CMS-HCC)     Bilateral iliac stents, right femoral and popliteal stents    SDH (subdural hematoma) (CMS-HCC)     After fall 2022, did not require surgery     Past Surgical History:   Procedure Laterality Date    PR LIGATN ANGIOACCESS AV FISTULA Right 05/11/2015    Procedure: LIGATION OR BANDING OF ANGIOACCESS ARTERIOVENOUS FISTULA;  Surgeon: Purcell Mouton, MD;  Location: MAIN OR [place];  Service: Transplant    PR TRANSPLANT,PREP CADAVER RENAL GRAFT N/A 10/20/2014    Procedure: BACKBNCH STD PREP CAD DONR RENAL ALLOGFT PRIOR TO TRNSPLNT, INCL DISSEC/REM PERINEPH FAT, DIAPH/RTPER ATTAC;  Surgeon: Purcell Mouton, MD;  Location: MAIN OR [place];  Service: Transplant    PR TRANSPLANTATION OF KIDNEY N/A 10/20/2014    Procedure: RENAL ALLOTRANSPLANTATION, IMPLANTATION OF GRAFT; WITHOUT RECIPIENT NEPHRECTOMY;  Surgeon: Purcell Mouton, MD;  Location: MAIN OR [place];  Service: Transplant    SKIN BIOPSY      TONSILLECTOMY  1953    TUBAL LIGATION  1985     SOCIAL HISTORY  Social History     Social History Narrative    Not on file      reports that she quit smoking about 8 years ago. Her smoking use included cigarettes. She started smoking about 43 years ago. She has a 17.5 pack-year smoking history. She has never used smokeless tobacco. She reports that she does not currently use alcohol. She reports that she does not use drugs.   FAMILY HISTORY  Family History   Problem Relation Age of  Onset    Multiple myeloma Mother     Diabetes Father     Diabetes Sister     Melanoma Neg Hx     Basal cell carcinoma Neg Hx     Squamous cell carcinoma Neg Hx         Physical Exam:   Vitals:    08/24/23 2308 08/25/23 0541 08/25/23 0845 08/25/23 1550   BP: 121/59 126/50 117/47 114/51   Pulse: 72 69 69 69   Resp: 18 17 16 18    Temp: 35.9 ??C (96.7 ??F) 36.8 ??C (98.2 ??F) 36.7 ??C (98.1 ??F) 36.7 ??C (98.1 ??F)   TempSrc: Oral Oral Oral Oral   SpO2: 95% 94% 96% 96%   Weight:       Height:         No intake/output data recorded.    Intake/Output Summary (Last 24 hours) at 08/25/2023 2327  Last data filed at 08/25/2023 1100  Gross per 24 hour   Intake 300 ml   Output 350 ml   Net -50 ml       Constitutional: well-appearing, no acute distress  Heart: warm extremities  Lungs: normal WOB  Abd: non-distended  Ext: 2+ edema  of R arm, no edema of LUE and BLE

## 2023-08-26 NOTE — Unmapped (Signed)
Problem: Skin Injury Risk Increased  Goal: Skin Health and Integrity  Outcome: Progressing     Problem: Adult Inpatient Plan of Care  Goal: Plan of Care Review  Outcome: Progressing  Flowsheets (Taken 08/26/2023 1631)  Progress: improving  Plan of Care Reviewed With: patient  Goal: Patient-Specific Goal (Individualized)  Outcome: Progressing  Goal: Absence of Hospital-Acquired Illness or Injury  Outcome: Progressing  Goal: Optimal Comfort and Wellbeing  Outcome: Progressing  Goal: Readiness for Transition of Care  Outcome: Progressing  Goal: Rounds/Family Conference  Outcome: Progressing     Problem: Fall Injury Risk  Goal: Absence of Fall and Fall-Related Injury  Outcome: Progressing     Problem: Latex Allergy  Goal: Absence of Allergy Symptoms  Outcome: Progressing     Problem: Self-Care Deficit  Goal: Improved Ability to Complete Activities of Daily Living  Outcome: Progressing     Problem: Pain Acute  Goal: Optimal Pain Control and Function  Outcome: Progressing

## 2023-08-26 NOTE — Unmapped (Signed)
Medicine Daily Progress Note    Assessment/Plan:  Principal Problem:    Multiple closed fractures of pelvis without disruption of pelvic ring with delayed healing  Active Problems:    Type II diabetes mellitus (CMS-HCC)    Immunosuppression (CMS-HCC)         Heidi Blankenship is a 77 y.o. female who is presenting to [facility] with Multiple closed fractures of pelvis without disruption of pelvic ring with delayed healing, in the setting of the following pertinent/contributing co-morbidities: History of kidney transplant, DM.    Multiple closed fractures of pelvis without disruption of pelvic ring with delayed healing   Patient with fall in May now with worsening pain over months with difficulty functioning at home over the last 10 days.  MRI shows insufficiency fractures of the bilateral sacral ala as well as the left pubic tubercle and superior and inferior pubic rami.  Has been seen by orthopedics.  Patient can be weightbearing as tolerated and no operative repair is recommended at this time.  In light of her worsening pain and difficulty functioning, will admit to observation for pain control and mobilization.  Was taking occasional oxycodone at home as well as using topical lidocaine.  In light of prolonged nature of her pain, may need to consider adjunctive agents as well.  - PT/OT evaluation  - Scheduled acetaminophen and lidocaine topical  - Continue Dilaudid 2-4 mg po q4h PRN (oxycodone didn't work well for her) -- encouraged patient to take pain medicine  - Discontinued Celebrex given kidney transplant  - Bowel regimen while on opiates     Kidney transplant/immunosuppression:  Renal function appears near baseline.  Follows with Dr. Margaretmary Blankenship at [facility] clinic.  On sirolimus, tacrolimus and daily prednisone. Cr mildly elevated at 1.03 on 08/24/23. Urine in the AM on 8/31 appears dark. She may be dehydrated due to not having gotten out of bed for 3 days prior to admission and only taking sips of fluids. She is now drinking well. She received 1 L IV NS on 8/31. Cr stable at 0.9.  - Paged renal transplant to alert them of admission  - Continue current immunosuppression     Type II diabetes mellitus (CMS-HCC)  Uses an unusual regimen at home with NPH only once daily at 25 to 28 units.  Also uses regular insulin once to twice daily but only in the afternoon and evening.  Typically uses 7 to 10 units at 3 PM and then may repeat this based on sugars and intake at 6 PM.  She has found that deviations from this regimen have led to hypoglycemia or significant elevations. HbA1C 5.2% on 08/22/23.  - Continue NPH 25 units daily in the morning  - Added regular insulin 6 units nightly at 6 pm (similar to home regimen)  - SSI     HTN:  - Continue home lisinopril     PAD/lymphedema:  History of right sided arterial stents as well as bilateral iliac stents.  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  Also has a RUE swelling of unclear etiology which has been more chronic.  - Continue clopidogrel     Prophylaxis  - Enoxaparin     Diet  - Nutrition Therapy Regular/House     Code Status / HCDM  -  FULL, Discussed with patient at the time of admission   -  HCDM (patient stated preference): Heidi Blankenship - Sister - [phone]     HCDM (patient stated preference):  Heidi Blankenship Sister - [phone]    Dispo: Floor status, observation. CM working on SNF placement for rehab pending adequate pain control.    I personally spent 45 minutes face-to-face and non-face-to-face in the care of this patient, which includes all pre, intra, and post visit time on the date of service.  All documented time was specific to the E/M visit and does not include any procedures that may have been performed.  ___________________________________________________________________    Subjective:  This morning Heidi Blankenship states that her pain is getting worse and it has moved to her lower back. I asked if she wanted a heating pad and she said that it always burns her. I encouraged her to ask for pain medication when she has pain to enable her to move more -- sit on the edge of the bed, work with PT, etc... She expressed understanding.    Labs/Studies:  Labs and Studies from the last 24hrs per EMR and Reviewed    Objective:  Temp:  [36.6 ??C (97.9 ??F)-36.8 ??C (98.2 ??F)] 36.8 ??C (98.2 ??F)  Heart Rate:  [60-69] 67  Resp:  [17-18] 18  BP: (114-143)/(51-58) 143/58  SpO2:  [95 %-96 %] 95 %, BP 143/58  - Pulse 67  - Temp 36.8 ??C (98.2 ??F) (Oral)  - Resp 18  - Ht 157.5 cm (5' 2.01)  - Wt 66.9 kg (147 lb 7.8 oz)  - LMP  (LMP Unknown)  - SpO2 95%  - BMI 26.97 kg/m?? ,   Intake/Output Summary (Last 24 hours) at 08/26/2023 1048  Last data filed at 08/26/2023 0630  Gross per 24 hour   Intake 120 ml   Output 200 ml   Net -80 ml   , Body mass index is 26.97 kg/m??.,   Wt Readings from Last 3 Encounters:   08/23/23 66.9 kg (147 lb 7.8 oz)   05/17/23 75.3 kg (166 lb)   05/14/23 75.3 kg (166 lb)   ,   Hemodynamics        Date/Time Pulse BP MAP (mmHg) Arterial Line BP    08/26/23 0740 67  143/58  80  --      Date/Time Arterial Line MAP Arterial Line BP 2 Arterial Line MAP 2 Patient Position    08/26/23 0740 -- -- -- Lying                   General: Well appearing, alert, conversant, cooperative, and in no distress.  Eyes: No scleral icterus. Appropriate eye contact.  ENT: Normal appearing nose. Oropharyngeal mucus membranes moist & pink.  Cardiovascular: Normal rate, regular rhythm. No murmur.  Extremities: Warm to touch. No pitting edema; Right upper and lower extremity lymphedema  Respiratory: Normal respiratory effort on room air. Clear to auscultation bilaterally.  Gastrointestinal: Soft, non-tender, non-distended  GU: No foley  Neurologic: Alert and fully oriented. Normal speech and language. Moving all extremities purposefully. Unable to stand due to severe left groin pain with bearing weight. Pain with abduction of left hip. No pain in right leg.  Psychiatric: Seems despairing at times because she feels nothing works for her; Appropriate affect and behavior.

## 2023-08-26 NOTE — Unmapped (Signed)
Problem: Skin Injury Risk Increased  Goal: Skin Health and Integrity  Outcome: Progressing  Intervention: Optimize Skin Protection  Recent Flowsheet Documentation  Taken 08/25/2023 1600 by Gwynneth Macleod, RN  Pressure Reduction Techniques: frequent weight shift encouraged  Taken 08/25/2023 1400 by Gwynneth Macleod, RN  Pressure Reduction Techniques: frequent weight shift encouraged     Problem: Adult Inpatient Plan of Care  Goal: Plan of Care Review  Outcome: Progressing  Goal: Patient-Specific Goal (Individualized)  Outcome: Progressing  Goal: Absence of Hospital-Acquired Illness or Injury  Outcome: Progressing  Intervention: Identify and Manage Fall Risk  Recent Flowsheet Documentation  Taken 08/25/2023 1600 by Gwynneth Macleod, RN  Safety Interventions:   fall reduction program maintained   low bed   nonskid shoes/slippers when out of bed   room near unit station  Taken 08/25/2023 1400 by Gwynneth Macleod, RN  Safety Interventions:   fall reduction program maintained   bed alarm   low bed   room near unit station   nonskid shoes/slippers when out of bed  Intervention: Prevent Skin Injury  Recent Flowsheet Documentation  Taken 08/25/2023 1600 by Gwynneth Macleod, RN  Positioning for Skin: Supine/Back  Taken 08/25/2023 1400 by Gwynneth Macleod, RN  Positioning for Skin: Supine/Back  Goal: Optimal Comfort and Wellbeing  Outcome: Progressing  Goal: Readiness for Transition of Care  Outcome: Progressing  Goal: Rounds/Family Conference  Outcome: Progressing     Problem: Fall Injury Risk  Goal: Absence of Fall and Fall-Related Injury  Outcome: Progressing  Intervention: Promote Scientist, clinical (histocompatibility and immunogenetics) Documentation  Taken 08/25/2023 1600 by Gwynneth Macleod, RN  Safety Interventions:   fall reduction program maintained   low bed   nonskid shoes/slippers when out of bed   room near unit station  Taken 08/25/2023 1400 by Gwynneth Macleod, RN  Safety Interventions:   fall reduction program maintained   bed alarm   low bed room near unit station   nonskid shoes/slippers when out of bed     Problem: Latex Allergy  Goal: Absence of Allergy Symptoms  Outcome: Progressing     Problem: Self-Care Deficit  Goal: Improved Ability to Complete Activities of Daily Living  Outcome: Progressing     Problem: Pain Acute  Goal: Optimal Pain Control and Function  Outcome: Progressing

## 2023-08-27 DIAGNOSIS — Z940 Kidney transplant status: Principal | ICD-10-CM

## 2023-08-27 DIAGNOSIS — Z79899 Other long term (current) drug therapy: Principal | ICD-10-CM

## 2023-08-27 LAB — TACROLIMUS LEVEL, TROUGH: TACROLIMUS, TROUGH: 3.7 ng/mL — ABNORMAL LOW (ref 5.0–15.0)

## 2023-08-27 LAB — SIROLIMUS LEVEL: SIROLIMUS LEVEL BLOOD: 4.1 ng/mL (ref 3.0–20.0)

## 2023-08-27 MED ADMIN — HYDROmorphone (DILAUDID) tablet 2 mg: 2 mg | ORAL | Stop: 2023-09-07

## 2023-08-27 MED ADMIN — tacrolimus (PROGRAF) capsule 1.5 mg: 1.5 mg | ORAL | @ 14:00:00

## 2023-08-27 MED ADMIN — HYDROmorphone (DILAUDID) tablet 4 mg: 4 mg | ORAL | @ 11:00:00 | Stop: 2023-08-27

## 2023-08-27 MED ADMIN — atorvastatin (LIPITOR) tablet 20 mg: 20 mg | ORAL | @ 14:00:00

## 2023-08-27 MED ADMIN — pantoprazole (Protonix) EC tablet 40 mg: 40 mg | ORAL | @ 14:00:00

## 2023-08-27 MED ADMIN — enoxaparin (LOVENOX) syringe 40 mg: 40 mg | SUBCUTANEOUS

## 2023-08-27 MED ADMIN — tacrolimus (PROGRAF) capsule 1 mg: 1 mg | ORAL

## 2023-08-27 MED ADMIN — sirolimus (RAPAMUNE) tablet 1 mg: 1 mg | ORAL | @ 14:00:00

## 2023-08-27 MED ADMIN — insulin NPH (HumuLIN,NovoLIN) injection 20 Units: 20 [IU] | SUBCUTANEOUS | @ 15:00:00

## 2023-08-27 MED ADMIN — insulin lispro (HumaLOG) injection 0-20 Units: 0-20 [IU] | SUBCUTANEOUS | @ 23:00:00

## 2023-08-27 MED ADMIN — levothyroxine (SYNTHROID) tablet 100 mcg: 100 ug | ORAL | @ 10:00:00

## 2023-08-27 MED ADMIN — predniSONE (DELTASONE) tablet 5 mg: 5 mg | ORAL | @ 14:00:00

## 2023-08-27 MED ADMIN — HYDROmorphone (DILAUDID) tablet 4 mg: 4 mg | ORAL | @ 08:00:00 | Stop: 2023-09-07

## 2023-08-27 MED ADMIN — acetaminophen (TYLENOL) tablet 1,000 mg: 1000 mg | ORAL | @ 23:00:00

## 2023-08-27 MED ADMIN — insulin regular (HumuLIN,NovoLIN) injection 6 Units: 6 [IU] | SUBCUTANEOUS | @ 23:00:00

## 2023-08-27 MED ADMIN — HYDROmorphone (DILAUDID) tablet 4 mg: 4 mg | ORAL | @ 23:00:00 | Stop: 2023-09-07

## 2023-08-27 MED ADMIN — clopidogrel (PLAVIX) tablet 75 mg: 75 mg | ORAL | @ 15:00:00

## 2023-08-27 MED ADMIN — acetaminophen (TYLENOL) tablet 1,000 mg: 1000 mg | ORAL | @ 14:00:00

## 2023-08-27 MED ADMIN — cholecalciferol (vitamin D3 25 mcg (1,000 units)) tablet 50 mcg: 50 ug | ORAL | @ 14:00:00

## 2023-08-27 MED ADMIN — psyllium (METAMUCIL) 3.4 gram packet 1 packet: 1 | ORAL | @ 14:00:00

## 2023-08-27 NOTE — Unmapped (Signed)
Pt in bed most of night, using bedpan to toilet. Dilaudid 2mg  po prn x 1 effective for moderate pain. BSC arrived this morning and pt OOB to [facility] w/ minimal assist. Pt c/o 8/10 pain to left hip/ pelvis after Activity. Dilaudid 4mg  po prn x 1 effective for severe pain when at rest.  BS 80s-180s over past day. CM following for discharge needs- referrals sent for SNF placement.     Problem: Skin Injury Risk Increased  Goal: Skin Health and Integrity  Outcome: Ongoing - Unchanged  Intervention: Optimize Skin Protection  Recent Flowsheet Documentation  Taken 08/27/2023 0400 by Carlena Hurl, RN ADN  Activity Management: up to bedside commode  Taken 08/26/2023 2000 by Carlena Hurl, RN ADN  Activity Management: back to bed  Pressure Reduction Techniques: frequent weight shift encouraged  Head of Bed (HOB) Positioning: HOB at 30-45 degrees  Pressure Reduction Devices: pressure-redistributing mattress utilized     Problem: Adult Inpatient Plan of Care  Goal: Plan of Care Review  Outcome: Ongoing - Unchanged  Flowsheets (Taken 08/27/2023 0511)  Progress: no change  Plan of Care Reviewed With: patient  Goal: Patient-Specific Goal (Individualized)  Outcome: Ongoing - Unchanged  Goal: Absence of Hospital-Acquired Illness or Injury  Outcome: Progressing  Intervention: Identify and Manage Fall Risk  Recent Flowsheet Documentation  Taken 08/26/2023 2000 by Carlena Hurl, RN ADN  Safety Interventions:   aspiration precautions   bed alarm   bleeding precautions   commode/urinal/bedpan at bedside   environmental modification   fall reduction program maintained   lighting adjusted for tasks/safety   low bed  Intervention: Prevent Skin Injury  Recent Flowsheet Documentation  Taken 08/26/2023 2000 by Carlena Hurl, RN ADN  Positioning for Skin: Supine/Back  Intervention: Prevent and Manage VTE (Venous Thromboembolism) Risk  Recent Flowsheet Documentation  Taken 08/26/2023 2015 by Carlena Hurl, RN ADN  VTE Prevention/Management:   ambulation promoted   anticoagulant therapy   bleeding precautions maintained   bleeding risk factors identified   dorsiflexion/plantar flexion performed   fluids promoted  Goal: Optimal Comfort and Wellbeing  Outcome: Ongoing - Unchanged  Goal: Readiness for Transition of Care  Outcome: Progressing  Goal: Rounds/Family Conference  Outcome: Ongoing - Unchanged     Problem: Fall Injury Risk  Goal: Absence of Fall and Fall-Related Injury  Outcome: Progressing  Intervention: Promote Injury-Free Environment  Recent Flowsheet Documentation  Taken 08/26/2023 2000 by Carlena Hurl, RN ADN  Safety Interventions:   aspiration precautions   bed alarm   bleeding precautions   commode/urinal/bedpan at bedside   environmental modification   fall reduction program maintained   lighting adjusted for tasks/safety   low bed     Problem: Self-Care Deficit  Goal: Improved Ability to Complete Activities of Daily Living  Outcome: Ongoing - Unchanged     Problem: Pain Acute  Goal: Optimal Pain Control and Function  Outcome: Ongoing - Unchanged     Problem: Comorbidity Management  Goal: Blood Glucose Levels Within Targeted Range  Outcome: Progressing

## 2023-08-27 NOTE — Unmapped (Signed)
Medicine Daily Progress Note    Assessment/Plan:  Principal Problem:    Multiple closed fractures of pelvis without disruption of pelvic ring with delayed healing  Active Problems:    Type II diabetes mellitus (CMS-HCC)    Immunosuppression (CMS-HCC)         Heidi Blankenship is a 77 y.o. female who is presenting to [facility] with Multiple closed fractures of pelvis without disruption of pelvic ring with delayed healing, in the setting of the following pertinent/contributing co-morbidities: History of kidney transplant, DM.    Multiple closed fractures of pelvis without disruption of pelvic ring with delayed healing   Patient with fall in May now with worsening pain over months with difficulty functioning at home over the last 10 days.  MRI shows insufficiency fractures of the bilateral sacral ala as well as the left pubic tubercle and superior and inferior pubic rami.  Has been seen by orthopedics.  Patient can be weightbearing as tolerated and no operative repair is recommended at this time.  In light of her worsening pain and difficulty functioning, will admit to observation for pain control and mobilization.  Was taking occasional oxycodone at home as well as using topical lidocaine. Patient is improving in mobility on dilaudid.  - PT/OT evaluation  - Scheduled acetaminophen and lidocaine topical  - Continue Dilaudid 2-4 mg po q4h PRN (oxycodone didn't work well for her) -- encouraged patient to take pain medicine  - Patient declined gabapentin due to bad experience with this med in the past  - Discontinued Celebrex given kidney transplant  - Bowel regimen while on opiates     Kidney transplant/immunosuppression:  Renal function appears near baseline.  Follows with Dr. Margaretmary Bayley at [facility] clinic.  On sirolimus, tacrolimus and daily prednisone. Cr mildly elevated at 1.03 on 08/24/23. Urine in the AM on 8/31 appears dark. She may be dehydrated due to not having gotten out of bed for 3 days prior to admission and only taking sips of fluids. She is now drinking well. She received 1 L IV NS on 8/31. Cr stable at 0.9.  - Paged renal transplant to alert them of admission  - Continue current immunosuppression     Type II diabetes mellitus (CMS-HCC)  Uses an unusual regimen at home with NPH only once daily at 25 to 28 units.  Also uses regular insulin once to twice daily but only in the afternoon and evening.  Typically uses 7 to 10 units at 3 PM and then may repeat this based on sugars and intake at 6 PM.  She has found that deviations from this regimen have led to hypoglycemia or significant elevations. HbA1C 5.2% on 08/22/23.  - Decreased home NPH 25 QD to 20 QD on 9/2 for BG 70s in the AM  - Added regular insulin 6 units nightly at 6 pm (similar to home regimen)  - SSI     HTN:  - Continue home lisinopril     PAD/lymphedema:  History of right sided arterial stents as well as bilateral iliac stents.  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  Also has a RUE swelling of unclear etiology which has been more chronic.  - Continue clopidogrel     Prophylaxis  - Enoxaparin     Diet  - Nutrition Therapy Regular/House     Code Status / HCDM  -  FULL, Discussed with patient at the time of admission   -  HCDM (patient stated preference):  [name] Sister - [phone]     HCDM (patient stated preference): Merton Border - Sister - [phone]    Dispo: Floor status, inpatient. Patient is medically ready for discharge to SNF rehab.    I personally spent 45 minutes face-to-face and non-face-to-face in the care of this patient, which includes all pre, intra, and post visit time on the date of service.  All documented time was specific to the E/M visit and does not include any procedures that may have been performed.  ___________________________________________________________________    Subjective:  Overnight the patient was able to get up, pivot, and sit on the bedside commode after taking oral dilaudid. She was able to bear weight on her left leg. She did have trouble lifting her left leg back into the bed due to pain but said it was doable. This is a big improvement.    Labs/Studies:  Labs and Studies from the last 24hrs per EMR and Reviewed    Objective:  Temp:  [36.4 ??C (97.5 ??F)-36.6 ??C (97.9 ??F)] 36.6 ??C (97.9 ??F)  Heart Rate:  [55-72] 55  Resp:  [16-18] 18  BP: (122-133)/(53-61) 132/56  SpO2:  [96 %-98 %] 97 %, BP 132/56  - Pulse 55  - Temp 36.6 ??C (97.9 ??F) (Oral)  - Resp 18  - Ht 157.5 cm (5' 2.01)  - Wt 66.9 kg (147 lb 7.8 oz)  - LMP  (LMP Unknown)  - SpO2 97%  - BMI 26.97 kg/m?? ,   Intake/Output Summary (Last 24 hours) at 08/27/2023 0834  Last data filed at 08/27/2023 0615  Gross per 24 hour   Intake 445 ml   Output 125 ml   Net 320 ml   , Body mass index is 26.97 kg/m??.,   Wt Readings from Last 3 Encounters:   08/23/23 66.9 kg (147 lb 7.8 oz)   05/17/23 75.3 kg (166 lb)   05/14/23 75.3 kg (166 lb)   ,   Hemodynamics        Date/Time Pulse BP MAP (mmHg) Arterial Line BP    08/27/23 0750 55  132/56  79  --      Date/Time Arterial Line MAP Arterial Line BP 2 Arterial Line MAP 2 Patient Position    08/27/23 0750 -- -- -- Lying                   General: Well appearing, alert, conversant, cooperative, and in no distress.  Eyes: No scleral icterus. Appropriate eye contact.  ENT: Normal appearing nose. Oropharyngeal mucus membranes moist & pink.  Cardiovascular: Normal rate, regular rhythm. No murmur.  Extremities: Warm to touch. Right upper extremity lymphedema; LLE > RLE due to lymphedema but less severe than in RUE  Respiratory: Normal respiratory effort on room air. Clear to auscultation bilaterally.  Gastrointestinal: Soft, non-tender, non-distended  GU: No foley  Neurologic: Alert and fully oriented. Normal speech and language. Moving all extremities purposefully. Pain in left groin with lifting or abducting left leg. No pain in right leg.  Psychiatric: More optimistic today after some improvement in mobility; Appropriate affect and behavior.

## 2023-08-27 NOTE — Unmapped (Signed)
Tacrolimus/Sirolimus Therapeutic Monitoring Pharmacy Note    Heidi Blankenship is a 77 y.o. female continuing tacrolimus.     Indication: Kidney transplant     Date of Transplant:  10/20/14       Prior Dosing Information: Home regimen Tacrolimus 1.5 mg QAM and 1 mg QPM; Sirolimus 1 mg daily                                                                      SEE BELOW FOR CURRENT INPATIENT DOSING / LEVELS    Source(s) of information used to determine prior to admission dosing: Clinic Note    Goals:  Therapeutic Drug Levels  Tacrolimus trough goal:  2-4 ng/ml  Sirolimus trough goal: 3-8 ng/ml    Additional Clinical Monitoring/Outcomes  Monitor renal function (SCr and urine output) and liver function (LFTs)  Monitor for signs/symptoms of adverse events (e.g., hyperglycemia, hyperkalemia, hypomagnesemia, hypertension, headache, tremor)    Pharmacokinetic Considerations and Significant Drug Interactions:  Concurrent hepatotoxic medications: atorva 20mg  ([facility]) Acetaminophen 1000mg  q8h (NEW)  Concurrent CYP3A4 substrates/inhibitors: None identified  Concurrent nephrotoxic medications: Celecoxib 100mg  bid(NEW), lisinopril 10mg  daily ([facility])    Sirolimus Dosing + Tacrolimus Dosing  Sirolimus 1mg  AM +  Tacro 1.5mg  AM and 1mg  PM    Results:   Sirolimus + Tacrolimus Levels  8/30: Siro = 3.6 ng/mL at 0743 + Tacro = 2.5 ng/mL at 0743  9/1: Siro = 3.7 at 0732 ng/mL + Tacro = 3.8 ng/mL at 0735  9/2: Siro= 4.1 ng/mL at 0447 + Tacro= 3.7 ng/mL at 0447      Assessment/Plan:  8/30 Both Sirolimus + tacrolimus - are within goal limits  8/30 Scr slight elevation (1.03) - 0.85 on adm (8/28)  9/1 Both sirolimus and tacrolimus are within goal limits  9/2 Both sirolimus and tacrolimus are within goal limits    Recommendedation(s) unless otherwise indicated by Transplant Team  Continue --tacrolimus 1.5 mg QAM and 1 mg QPM  Continue --sirolimus 1 mg daily  Use caution with celecoxib (increase risk for nephrotoxicity, especially with tacro/sirolimus) - consider alternative therapy    Follow-up  Tacrolimus + Sirolimus Levels:  MWF- with AM labs between 0600-0800 PRIOR to morning doses.  Doses due 0800  A pharmacist will continue to monitor and recommend levels as appropriate    Please page service pharmacist with questions/clarifications.    Joyice Faster, PharmD

## 2023-08-27 NOTE — Unmapped (Addendum)
Care Management  Initial Transition Planning Assessment              General  Care Manager assessed the patient by : In person interview with patient, Medical record review  Orientation Level: Oriented X4  Functional level prior to admission: Independent  Reason for referral: Discharge Planning    Contact/Decision [facility]  Extended Emergency Contact Information  Primary Emergency Contact: [facility]  Address: [address]           [place], [place] [address] [place] Phone: [phone]  Relation: Sister  Secondary Emergency Contact: [name],[name]  Address: [address]           [place], [place] [address] [place] [employer] Phone: [phone]  Relation: Sister    Armed forces operational officer Next of Kin / Guardian / POA / Advance Directives     HCDM (patient stated preference): [name] - Sister - [phone]    HCDM (patient stated preference): Merton Border - Sister - [phone]    Advance Directive (Medical Treatment)  Does patient have an advance directive covering medical treatment?: Patient does not have advance directive covering medical treatment.    Health Care Decision Maker [HCDM] (Medical & Mental Health Treatment)  Healthcare Decision Maker: HCDM documented in the HCDM/Contact Info section.  Information offered on HCDM, Medical & Mental Health advance directives:: Patient given information.    Advance Directive (Mental Health Treatment)  Does patient have an advance directive covering mental health treatment?: Patient does not have advance directive covering mental health treatment.    Readmission Information    Have you been hospitalized in the last 30 days?: No                Patient Information  Lives with: Alone    Type of Residence: Private residence      Type of Residence: Mailing Address:  [address]  [place] [address]  Contacts: Accompanied by: Family member  Patient Phone Number: [phone] (mobile)        Medical Provider(s): Jaclyn Shaggy, MD  Reason for Admission: Admitting Diagnosis:  Closed fracture of multiple pubic rami, left, initial encounter (CMS-HCC) [S32.592A]  Closed fracture of sacrum, unspecified portion of sacrum, initial encounter (CMS-HCC) [S32.10XA]  Past Medical History:   has a past medical history of Arthritis (Oct. 2020), Atrial fibrillation (CMS-HCC), Basal cell carcinoma, Complication of anesthesia (2013), Diabetes mellitus (CMS-HCC), Disease of thyroid gland, ESRD (end stage renal disease) on dialysis (CMS-HCC), History of nonmelanoma skin cancer (05/31/2015), Kidney transplant recipient, Lymphedema, PAD (peripheral artery disease) (CMS-HCC), and SDH (subdural hematoma) (CMS-HCC).  Past Surgical History:   has a past surgical history that includes pr transplantation of kidney (N/A, 10/20/2014); pr transplant,prep cadaver renal graft (N/A, 10/20/2014); pr ligatn angioaccess av fistula (Right, 05/11/2015); Tubal ligation (1985); Tonsillectomy (1953); and Skin biopsy.   Previous admit date: 10/20/2014    Primary Insurance- Payor: HUMANA MEDICARE ADV / Plan: HUMANA GOLD PLUS HMO / Product Type: *No Product type* /   Secondary Insurance - None  Prescription Coverage - Hum[employer] Medicare   Preferred Pharmacy - [id number] [place], [place] - [address]  [facility]  [facility] [place] - [address] [facility] [address]    Transportation home: Medical Transport          Location/Detail: [address] [place] [address]    Support Systems/Concerns: Family Members    Responsibilities/Dependents at  home?: No    Home Care services in place prior to admission?: No                  Equipment Currently Used at Home: cane, straight, walker, rolling       Currently receiving outpatient dialysis?: No       Financial Information       Need for financial assistance?: No       Social Determinants of Health  Social Determinants of Health     Financial Resource Strain: Low Risk  (08/27/2023)    Overall Financial Resource Strain (CARDIA)     Difficulty of Paying Living Expenses: Not hard at all   Internet Connectivity: Not on file   Food Insecurity: No Food Insecurity (08/27/2023)    Hunger Vital Sign     Worried About Running Out of Food in the Last Year: Never true     Ran Out of Food in the Last Year: Never true   Tobacco Use: Medium Risk (08/23/2023)    Patient History     Smoking Tobacco Use: Former     Smokeless Tobacco Use: Never     Passive Exposure: Not on file   Housing/Utilities: Low Risk  (08/27/2023)    Housing/Utilities     Within the past 12 months, have you ever stayed: outside, in a car, in a tent, in an overnight shelter, or temporarily in someone else's home (i.e. couch-surfing)?: No     Are you worried about losing your housing?: No     Within the past 12 months, have you been unable to get utilities (heat, electricity) when it was really needed?: No   Alcohol Use: Not on file   Transportation Needs: No Transportation Needs (08/27/2023)    PRAPARE - Transportation     Lack of Transportation (Medical): No     Lack of Transportation (Non-Medical): No   Substance Use: Not on file   Health Literacy: Not on file   Physical Activity: Not on file   Interpersonal Safety: Unknown (08/27/2023)    Interpersonal Safety     Unsafe Where You Currently Live: Not on file     Physically Hurt by Anyone: Not on file     Abused by Anyone: Not on file   Stress: Not on file   Intimate Partner Violence: Not on file   Depression: Not on file   Social Connections: Not on file       Complex Discharge Information    Is patient identified as a difficult/complex discharge?: No                  Discharge Needs Assessment  Concerns to be Addressed: discharge planning    Clinical Risk Factors: > 65, Multiple Diagnoses (Chronic), Lives Alone or Absence of Caregiver to Assist with Discharge and Home Care, History of Falls, Functional Limitations    Barriers to taking medications: No    Prior overnight hospital stay or ED visit in last 90 days: Yes Anticipated Changes Related to Illness: none    Equipment Needed After Discharge: other (see comments) (tbd)    Discharge Facility/Level of Care Needs: nursing facility, skilled    Readmission  Risk of Unplanned Readmission Score: UNPLANNED READMISSION SCORE: 12.22%  Predictive Model Details          12% (Medium)  Factor Value    Calculated 08/27/2023 16:03 35% Number of active inpatient medication orders 36    Halfway Risk of Unplanned Readmission Model 15% Number  of ED visits in last six months 2     11% Latest BUN high (25 mg/dL)     9% Imaging order present in last 6 months     9% Age 77     6% Active anticoagulant inpatient medication order present     6% Active corticosteroid inpatient medication order present     4% Current length of stay 2.884 days     3% Future appointment scheduled     2% Charlson Comorbidity Index 1     1% Active ulcer inpatient medication order present      Readmitted Within the Last 30 Days? (No if blank)   Patient at risk for readmission?: Yes    Discharge Plan  Screen findings are: Discharge planning needs identified or anticipated (Comment).    Expected Discharge Date: 08/28/2023    Expected Transfer from Critical Care:      Quality data for continuing care services shared with patient and/or representative?: Yes  Patient and/or family were provided with choice of facilities / services that are available and appropriate to meet post hospital care needs?: Yes   List choices in order highest to lowest preferred, if applicable. : [facility]    Initial Assessment complete?: Yes    CM met patient at bedside and introduced self and role.     The pt lives alone in a one story home with 5 steps to enter the front door and 5 steps to enter the back door. She says the steps don't bother her, but walking has been painful lately. PTA she was independent with ADLs. She has a rolling walker and cane at home. She drives herself. Her sisters- Corrie Dandy and Okey Regal live next door. She would like to accept the bed at [facility].     CM to check tomorrow for bed availability and start auth.     CM will continue to follow.       Murrell Converse RN   Case Manager  August 27, 2023 4:41 PM

## 2023-08-27 NOTE — Unmapped (Addendum)
Transplant Nephrology Consult     Requesting Attending Physician :  Delanna Ahmadi, MD  Service Requesting Consult : Med [facility])  Reason for Consult: kidney transplant    Assessment and Plan:   76yoF with hx of DDKT on 10/20/2014 h/w pelvic fractures    # S/p Kidney Transplant, Kidney allograft function:  Lab Results   Component Value Date    CREATININE 0.92 08/26/2023     - Graft function stable, continue to follow closely.    # Immunosuppression:  - Currently on sirolimus 1mg  q AM, tacrolimus 1.5/1mg , prednisone 5mg .   - Please obtain trough tacrolimus, sirolimus trough levels once a week prior to the morning dose of the medication.   - Tac trough goal of 2-4, Sirolimus trough goal of 3-8.  Lab Results   Component Value Date    TACROLIMUS 3.7 (L) 08/27/2023     Lab Results   Component Value Date    SIROLIMUS 4.1 08/27/2023     - These levels are an 8.5 hour and 20.5 hour troughs, respectively. These are likely at goal, would continue current sirolimus and tacrolimus levels.    # Blood Pressure / Volume:  - BP reasonably controlled on lisinopril.    # Infectious Prophylaxis and Monitoring:   - N/A  - EBV detected but unquantifiable 01/2023; neg CMV 7/12024    # Pelvic fractures  - Unclear whether sirolimus impedes bone healing (does for wounds) - will reach out to our pharmacists tomorrow.  - Evaluation and management per primary team.      RECOMMENDATIONS:   - Continue current immunosuppression, try to draw trough levels closer to 12/24 hours from previous dose.  - Transplant patients with an open wound require wound care with sterile water only. The patient should be counseled on this at the time of discharge if they have not already been doing this.  - We will continue to follow.     Drinda Butts, MD  08/27/2023 12:41 PM     Medical decision-making for 08/27/23  Findings / Data     Patient has: []  acute illness w/systemic sxs  [mod]  []  two or more stable chronic illnesses [mod]  []  one chronic illness with acute exacerbation [mod]  []  acute complicated illness  [mod]  []  Undiagnosed new problem with uncertain prognosis  [mod] []  illness posing risk to life or bodily function (ex. AKI)  [high]  [x]  chronic illness with severe exacerbation/progression  [high]  []  chronic illness with severe side effects of treatment  [high] Hip fracture Probs At least 2:  Probs, Data, Risk   I reviewed: [x]  primary team note  []  consultant note(s)  []  external records [x]  chemistry results  []  CBC results  []  blood gas results  [x]  Other []  procedure/op note(s)   []  radiology report(s)  []  micro result(s)  []  w/ independent historian(s) Tac and sirolimus levels, Cr 0.9 ?3 Data Review (2 of 3)    I independently interpreted: []  Urine Sediment  []  Renal US []  CXR Images  []  CT Images  []  Other []  EKG Tracing  Any     I discussed: []  Pathology results w/ QHPs(s) from other specialties  []  Procedural findings w/ QHPs(s) from other specialties []  Imaging w/ QHP(s) from other specialties  [x]  Treatment plan w/ QHP(s) from other specialties Plan discussed with primary team Any     Mgm't requires: []  Prescription drug(s)  [mod]  []  Kidney biopsy  [mod]  []   Central line placement  [mod] [x]  High risk medication use and/or intensive toxicity monitoring [high]  []  Renal replacement therapy [high]  []  High risk kidney biopsy  [high]  []  Escalation of care  [high]  []  High risk central line placement  [high] Immunosuppression: high risk for infection Risk      _____________________________________________________________________________________    Transplant Background  Kidney Transplant History:   Date of Transplant: 10/20/2014 (Kidney)  Type of Transplant: Deceased  KDPI: 49%  Native Kidney Disease: Diabetes  Prior Transplants: None  Rejection Episodes: none    Outpatient baseline creatinine: 0.7 - 1.1      Subjective/Overnight events: Notes ongoing groin pain, worse with movement and ambulation, dilaudid helping with pain from 8-9 down to a 7 this am.    Physical Exam:   Vitals:    08/26/23 2038 08/27/23 0012 08/27/23 0750 08/27/23 0955   BP: 133/61 122/53 132/56 117/56   Pulse: 72 63 55 73   Resp: 18 18 18     Temp: 36.4 ??C (97.5 ??F) 36.4 ??C (97.5 ??F) 36.6 ??C (97.9 ??F)    TempSrc: Oral Oral Oral    SpO2: 98% 96% 97%    Weight:       Height:         No intake/output data recorded.    Intake/Output Summary (Last 24 hours) at 08/27/2023 1241  Last data filed at 08/27/2023 0615  Gross per 24 hour   Intake 445 ml   Output 125 ml   Net 320 ml     General: no acute distress  HEENT: anicteric sclera  CV: no peripheral edema  Lungs: normal work of breathing  Abdomen: non-distended  Skin: no visible lesions or rashes

## 2023-08-28 MED ADMIN — clopidogrel (PLAVIX) tablet 75 mg: 75 mg | ORAL | @ 14:00:00

## 2023-08-28 MED ADMIN — tacrolimus (PROGRAF) capsule 1 mg: 1 mg | ORAL | @ 01:00:00

## 2023-08-28 MED ADMIN — sirolimus (RAPAMUNE) tablet 1 mg: 1 mg | ORAL | @ 13:00:00

## 2023-08-28 MED ADMIN — cholecalciferol (vitamin D3 25 mcg (1,000 units)) tablet 50 mcg: 50 ug | ORAL | @ 12:00:00

## 2023-08-28 MED ADMIN — psyllium (METAMUCIL) 3.4 gram packet 1 packet: 1 | ORAL | @ 13:00:00

## 2023-08-28 MED ADMIN — glucose chewable tablet 16 g: 16 g | ORAL | @ 01:00:00 | Stop: 2024-08-22

## 2023-08-28 MED ADMIN — tacrolimus (PROGRAF) capsule 1.5 mg: 1.5 mg | ORAL | @ 13:00:00

## 2023-08-28 MED ADMIN — enoxaparin (LOVENOX) syringe 40 mg: 40 mg | SUBCUTANEOUS | @ 01:00:00

## 2023-08-28 MED ADMIN — insulin lispro (HumaLOG) injection 0-20 Units: 0-20 [IU] | SUBCUTANEOUS | @ 22:00:00

## 2023-08-28 MED ADMIN — levothyroxine (SYNTHROID) tablet 100 mcg: 100 ug | ORAL | @ 10:00:00

## 2023-08-28 MED ADMIN — lisinopril (PRINIVIL,ZESTRIL) tablet 10 mg: 10 mg | ORAL | @ 12:00:00

## 2023-08-28 MED ADMIN — atorvastatin (LIPITOR) tablet 20 mg: 20 mg | ORAL | @ 12:00:00

## 2023-08-28 MED ADMIN — HYDROmorphone (DILAUDID) tablet 2 mg: 2 mg | ORAL | @ 14:00:00 | Stop: 2023-09-07

## 2023-08-28 MED ADMIN — insulin NPH (HumuLIN,NovoLIN) injection 20 Units: 20 [IU] | SUBCUTANEOUS | @ 12:00:00

## 2023-08-28 MED ADMIN — predniSONE (DELTASONE) tablet 5 mg: 5 mg | ORAL | @ 12:00:00

## 2023-08-28 MED ADMIN — pantoprazole (Protonix) EC tablet 40 mg: 40 mg | ORAL | @ 12:00:00

## 2023-08-28 NOTE — Unmapped (Signed)
Medicine Daily Progress Note    Assessment/Plan:  Principal Problem:    Multiple closed fractures of pelvis without disruption of pelvic ring with delayed healing  Active Problems:    Type II diabetes mellitus (CMS-HCC)    Immunosuppression (CMS-HCC)           Heidi Blankenship is a 77 y.o. female who presented to [facility] with Multiple closed fractures of pelvis without disruption of pelvic ring with delayed healing.    Multiple closed pelvic fractures with delayed healing: Fractures occurred secondary to fall in May.  Patient initially been doing well but pain had progressed and she was having difficulty functioning at home over the 10 days preceding admission.  MRI showed insufficiency fractures of bilateral sacral alla as well as left pubic tubercle and superior and inferior pubic rami.  She was seen by Ortho with recommendations for nonoperative management.  -Scheduled Tylenol and lidocaine patch, PRN hydromorphone  -Continue bowel regimen while on opiates  -PT/OT    DM2: Uses an unusual regimen 25-28U NPH once daily with regular insulin once to twice daily only in the afternoon and evening.  Typically uses 7 to 10 units at 3 PM and they may repeat this based on sugars at 6 PM.  Most recent hemoglobin A1c 5.2%  -NPH 20U given low BG 9/2  -Slightly decreased regular insulin to 5U nightly from 6U  -SSI    Kidney transplant, immunosuppression: Follows with Dr. Margaretmary Bayley at [facility].  Transplant nephrology aware of admission.  -Continue home sirolimus, tacrolimus, prednisone    Hypertension:  - Continue lisinopril.    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  -Continue clopidogrel    Code status, HCDM:  -   FULL, Discussed with patient at the time of admission   -  HCDM (patient stated preference): [name] - Sister - [phone]     HCDM (patient stated preference): [name],[name] - Sister - [phone]    I personally spent 40 minutes face-to-face and non-face-to-face in the care of this patient, which includes all pre, intra, and post visit time on the date of service.  All documented time was specific to the E/M visit and does not include any procedures that may have been performed.     ___________________________________________________________________    Subjective:  Lying in bed, somewhat frustrated by the rigidity of the hospital but overall is feeling well.  Is eager for rehab and ultimately discharge home to be with her cat, BK.    Labs/Studies:  Labs and Studies from the last 24hrs per EMR and Reviewed    Objective:  Temp:  [36.7 ??C (98.1 ??F)-37 ??C (98.6 ??F)] 36.8 ??C (98.2 ??F)  Heart Rate:  [52-75] 75  Resp:  [18] 18  BP: (116-140)/(51-67) 133/59  SpO2:  [94 %-98 %] 98 %    GEN: Lying in bed, no acute distress  HEENT: sclera anicteric, moist mucous membranes  CV: RRR, no murmur  RESP: CTAB, normal WOB  ABD: soft, non-tender, BS normoactive, non-distended  EXT: no LE edema  NEURO: non-focal

## 2023-08-28 NOTE — Unmapped (Signed)
Problem: Skin Injury Risk Increased  Goal: Skin Health and Integrity  Outcome: Ongoing - Unchanged  Intervention: Optimize Skin Protection  Recent Flowsheet Documentation  Taken 08/28/2023 1600 by Gwynneth Macleod, RN  Pressure Reduction Techniques: frequent weight shift encouraged  Taken 08/28/2023 1400 by Gwynneth Macleod, RN  Pressure Reduction Techniques: frequent weight shift encouraged  Taken 08/28/2023 1200 by Gwynneth Macleod, RN  Activity Management: bedrest  Pressure Reduction Techniques: frequent weight shift encouraged     Problem: Adult Inpatient Plan of Care  Goal: Plan of Care Review  Outcome: Ongoing - Unchanged  Goal: Patient-Specific Goal (Individualized)  Outcome: Ongoing - Unchanged  Goal: Absence of Hospital-Acquired Illness or Injury  Outcome: Ongoing - Unchanged  Intervention: Identify and Manage Fall Risk  Recent Flowsheet Documentation  Taken 08/28/2023 1600 by Gwynneth Macleod, RN  Safety Interventions:   fall reduction program maintained   low bed   nonskid shoes/slippers when out of bed   room near unit station   bed alarm  Taken 08/28/2023 1400 by Gwynneth Macleod, RN  Safety Interventions:   fall reduction program maintained   low bed   nonskid shoes/slippers when out of bed   room near unit station  Taken 08/28/2023 1200 by Gwynneth Macleod, RN  Safety Interventions:   bed alarm   fall reduction program maintained   low bed   nonskid shoes/slippers when out of bed   room near unit station  Intervention: Prevent Skin Injury  Recent Flowsheet Documentation  Taken 08/28/2023 1600 by Gwynneth Macleod, RN  Positioning for Skin: Supine/Back  Taken 08/28/2023 1400 by Gwynneth Macleod, RN  Positioning for Skin: Bed in Chair  Taken 08/28/2023 1200 by Gwynneth Macleod, RN  Positioning for Skin: Supine/Back  Goal: Optimal Comfort and Wellbeing  Outcome: Ongoing - Unchanged  Goal: Readiness for Transition of Care  Outcome: Ongoing - Unchanged  Goal: Rounds/Family Conference  Outcome: Ongoing - Unchanged     Problem: Fall Injury Risk  Goal: Absence of Fall and Fall-Related Injury  Outcome: Ongoing - Unchanged  Intervention: Promote Injury-Free Environment  Recent Flowsheet Documentation  Taken 08/28/2023 1600 by Gwynneth Macleod, RN  Safety Interventions:   fall reduction program maintained   low bed   nonskid shoes/slippers when out of bed   room near unit station   bed alarm  Taken 08/28/2023 1400 by Gwynneth Macleod, RN  Safety Interventions:   fall reduction program maintained   low bed   nonskid shoes/slippers when out of bed   room near unit station  Taken 08/28/2023 1200 by Gwynneth Macleod, RN  Safety Interventions:   bed alarm   fall reduction program maintained   low bed   nonskid shoes/slippers when out of bed   room near unit station     Problem: Latex Allergy  Goal: Absence of Allergy Symptoms  Outcome: Ongoing - Unchanged     Problem: Self-Care Deficit  Goal: Improved Ability to Complete Activities of Daily Living  Outcome: Ongoing - Unchanged     Problem: Pain Acute  Goal: Optimal Pain Control and Function  Outcome: Ongoing - Unchanged     Problem: Comorbidity Management  Goal: Blood Glucose Levels Within Targeted Range  Outcome: Ongoing - Unchanged

## 2023-08-28 NOTE — Unmapped (Signed)
Problem: Skin Injury Risk Increased  Goal: Skin Health and Integrity  Outcome: Progressing  Intervention: Optimize Skin Protection  Recent Flowsheet Documentation  Taken 08/27/2023 2200 by Crista Elliot, RN  Activity Management: up to bedside commode  Pressure Reduction Techniques: frequent weight shift encouraged  Head of Bed (HOB) Positioning: HOB at 20-30 degrees  Pressure Reduction Devices: pressure-redistributing mattress utilized  Skin Protection:   adhesive use limited   incontinence pads utilized  Taken 08/27/2023 2058 by Crista Elliot, RN  Pressure Reduction Techniques: frequent weight shift encouraged  Pressure Reduction Devices: pressure-redistributing mattress utilized  Skin Protection:   adhesive use limited   incontinence pads utilized     Problem: Adult Inpatient Plan of Care  Goal: Plan of Care Review  Outcome: Progressing  Goal: Patient-Specific Goal (Individualized)  Outcome: Progressing  Goal: Absence of Hospital-Acquired Illness or Injury  Outcome: Progressing  Intervention: Identify and Manage Fall Risk  Recent Flowsheet Documentation  Taken 08/27/2023 2200 by Crista Elliot, RN  Safety Interventions:   environmental modification   fall reduction program maintained   nonskid shoes/slippers when out of bed   lighting adjusted for tasks/safety   low bed  Intervention: Prevent Skin Injury  Recent Flowsheet Documentation  Taken 08/28/2023 0400 by Crista Elliot, RN  Positioning for Skin: Supine/Back  Taken 08/28/2023 0200 by Crista Elliot, RN  Positioning for Skin: Left  Taken 08/28/2023 0000 by Crista Elliot, RN  Positioning for Skin: Supine/Back  Taken 08/27/2023 2200 by Crista Elliot, RN  Positioning for Skin: Right  Device Skin Pressure Protection:   adhesive use limited   absorbent pad utilized/changed  Skin Protection:   adhesive use limited   incontinence pads utilized  Taken 08/27/2023 2058 by Crista Elliot, RN  Positioning for Skin: Supine/Back  Device Skin Pressure Protection:   absorbent pad utilized/changed   adhesive use limited  Skin Protection:   adhesive use limited   incontinence pads utilized  Goal: Optimal Comfort and Wellbeing  Outcome: Progressing  Goal: Readiness for Transition of Care  Outcome: Progressing  Goal: Rounds/Family Conference  Outcome: Progressing     Problem: Fall Injury Risk  Goal: Absence of Fall and Fall-Related Injury  Outcome: Progressing  Intervention: Promote Scientist, clinical (histocompatibility and immunogenetics) Documentation  Taken 08/27/2023 2200 by Crista Elliot, RN  Safety Interventions:   environmental modification   fall reduction program maintained   nonskid shoes/slippers when out of bed   lighting adjusted for tasks/safety   low bed     Problem: Latex Allergy  Goal: Absence of Allergy Symptoms  Outcome: Progressing     Problem: Self-Care Deficit  Goal: Improved Ability to Complete Activities of Daily Living  Outcome: Progressing     Problem: Pain Acute  Goal: Optimal Pain Control and Function  Outcome: Progressing     Problem: Comorbidity Management  Goal: Blood Glucose Levels Within Targeted Range  Outcome: Progressing       Alert and oriented, VSS, RA, denies pain. 9pm blood sugar was 65 mg/dl, patient denies symptoms. Glucose tablets given and repeat glucose check was at 68 mg/dl. Orange juice and crackers offered. Another Glucose check after 30 minutes was at 90 mg/dl. NP notified thru secure chat. Rounds performed and fall precautions maintained. Will continue to monitor.

## 2023-08-29 MED ADMIN — clopidogrel (PLAVIX) tablet 75 mg: 75 mg | ORAL | @ 13:00:00

## 2023-08-29 MED ADMIN — levothyroxine (SYNTHROID) tablet 100 mcg: 100 ug | ORAL | @ 11:00:00

## 2023-08-29 MED ADMIN — cholecalciferol (vitamin D3 25 mcg (1,000 units)) tablet 50 mcg: 50 ug | ORAL | @ 12:00:00

## 2023-08-29 MED ADMIN — lisinopril (PRINIVIL,ZESTRIL) tablet 10 mg: 10 mg | ORAL | @ 12:00:00

## 2023-08-29 MED ADMIN — insulin lispro (HumaLOG) injection 0-20 Units: 0-20 [IU] | SUBCUTANEOUS | @ 02:00:00

## 2023-08-29 MED ADMIN — atorvastatin (LIPITOR) tablet 20 mg: 20 mg | ORAL | @ 12:00:00

## 2023-08-29 MED ADMIN — predniSONE (DELTASONE) tablet 5 mg: 5 mg | ORAL | @ 12:00:00

## 2023-08-29 MED ADMIN — tacrolimus (PROGRAF) capsule 1 mg: 1 mg | ORAL | @ 02:00:00

## 2023-08-29 MED ADMIN — pantoprazole (Protonix) EC tablet 40 mg: 40 mg | ORAL | @ 12:00:00

## 2023-08-29 MED ADMIN — sirolimus (RAPAMUNE) tablet 1 mg: 1 mg | ORAL | @ 12:00:00

## 2023-08-29 MED ADMIN — insulin regular (HumuLIN,NovoLIN) injection 4 Units: 4 [IU] | SUBCUTANEOUS | @ 23:00:00

## 2023-08-29 MED ADMIN — insulin NPH (HumuLIN,NovoLIN) injection 20 Units: 20 [IU] | SUBCUTANEOUS | @ 13:00:00

## 2023-08-29 MED ADMIN — tacrolimus (PROGRAF) capsule 1.5 mg: 1.5 mg | ORAL | @ 12:00:00

## 2023-08-29 MED ADMIN — HYDROmorphone (DILAUDID) tablet 2 mg: 2 mg | ORAL | @ 17:00:00 | Stop: 2023-09-07

## 2023-08-29 MED ADMIN — enoxaparin (LOVENOX) syringe 40 mg: 40 mg | SUBCUTANEOUS | @ 02:00:00

## 2023-08-29 NOTE — Unmapped (Addendum)
Physician Discharge Summary [facility]  1 [facility] OBSERVATION [facility]  [address]  [place] [address]  Dept: [phone]  Loc: [phone]     Identifying Information:   Heidi Blankenship  09-25-1946  [id number]    Primary Care Physician: Jaclyn Shaggy, MD   Code Status: Full Code    Admit Date: 08/22/2023    Discharge Date: 08/30/2023     Discharge To: Skilled nursing facility    Discharge Service: [facility] - Hospitalist [place] APP     Discharge Attending Physician: Terri Piedra, [place]    Discharge Diagnoses:  Principal Problem:    Multiple closed fractures of pelvis without disruption of pelvic ring with delayed healing (POA: Not Applicable)  Active Problems:    Type II diabetes mellitus (CMS-HCC) (POA: Yes)    Immunosuppression (CMS-HCC) (POA: Yes)  Resolved Problems:    * No resolved hospital problems. *      Outpatient Provider Follow Up Issues:   [ ]  outpatient ortho follow up     Hospital Course:   Heidi Blankenship is a 77 y.o. female who presented to [facility] with Multiple closed fractures of pelvis without disruption of pelvic ring with delayed healing.     Multiple closed pelvic fractures with delayed healing: Fractures occurred secondary to fall in May.  Patient initially been doing well but pain had progressed and she was having difficulty functioning at home over the 10 days preceding admission.  Admission MRI showed insufficiency fractures of bilateral sacral alla as well as left pubic tubercle and superior and inferior pubic rami.  She was seen by Ortho with recommendations for nonoperative management. She declined scheduled Tylenol and was treated with lidocaine patch, PRN hydromorphone.  She was evaluated by PT/OT with recs for 5x weekly low therapy at discharge.     DM2: Uses an unusual regimen 25-28U NPH once daily with regular insulin once to twice daily only in the afternoon and evening.  Typically uses 7 to 10 units at 3 PM and they may repeat this based on sugars at 6 PM.  Most recent hemoglobin A1c 5.2% While admitted, NPH was reduced to 20U given low BG 9/2.  She will resume home regimen at discharge.     Kidney transplant, immunosuppression: Follows with Dr. Margaretmary Bayley at [facility] clinic.  Transplant nephrology aware of admission. Continued home sirolimus, tacrolimus, prednisone.     Hypertension:  Continued home lisinopril.     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 Continued home clopidogrel    Procedures:     No admission procedures for hospital encounter.  ______________________________________________________________________  Discharge Medications:     Your Medication List        START taking these medications      HYDROmorphone 2 MG tablet  Commonly known as: DILAUDID  Take 1 tablet (2 mg total) by mouth every four (4) hours as needed for moderate pain or severe pain.            CHANGE how you take these medications      acetaminophen 650 MG CR tablet  Commonly known as: TYLENOL 8 HOUR  Take 2 tablets (1,300 mg total) by mouth every eight (8) hours as needed for pain.  What changed:   when to take this  reasons to take this     levothyroxine 100 MCG tablet  Commonly known as: SYNTHROID  TAKE 1 TABLET BY MOUTH EVERY DAY  What changed:   when  to take this  additional instructions            CONTINUE taking these medications      atorvastatin 20 MG tablet  Commonly known as: LIPITOR  Take 1 tablet (20 mg total) by mouth daily.     cholecalciferol (vitamin D3-50 mcg (2,000 unit)) 50 mcg (2,000 unit) tablet  Commonly known as: VITAMIN D3-50 mcg (2,000 unit)  Take 1 tablet by mouth daily.     clopidogrel 75 mg tablet  Commonly known as: PLAVIX  Take 1 tablet (75 mg total) by mouth daily.     lidocaine 5 % patch  Commonly known as: LIDODERM  Place 1 patch on the skin daily. Apply to affected area for 12 hours only each day (then remove patch)     lisinopril 10 MG tablet  Commonly known as: PRINIVIL,ZESTRIL  Take 1 tablet (10 mg total) by mouth daily.     LUTEIN ORAL  Take 1 tablet by mouth daily.     NovoLIN N NPH U-100 Insulin 100 unit/mL injection  Generic drug: insulin NPH  0.25-0.28 mL (25-28 Units total) daily. Takes 25 to 28 units in am depending on sugar     NovoLIN R Regular U100 Insulin 100 unit/mL injection  Generic drug: insulin regular  Inject 0.07-0.1 mL (7-10 Units total) under the skin two (2) times a day. Takes 3 pm and again near 6 pm if needed     omeprazole 40 MG capsule  Commonly known as: PriLOSEC  Take 1 capsule (40 mg total) by mouth every other day.     predniSONE 5 MG tablet  Commonly known as: DELTASONE  Take 1 tablet (5 mg total) by mouth daily.     sirolimus 0.5 mg tablet  Commonly known as: RAPAMUNE  Take 2 tablets (1 mg total) by mouth in the morning.     tacrolimus 0.5 MG capsule  Commonly known as: PROGRAF  TAKE 3 CAPSULES (1.5 MG) IN THE MORNING AND 2 CAPSULES (1 MG) IN THE EVENING     VITAMIN K ORAL  Take 1 tablet by mouth in the morning.     ZINC ORAL  Take 1 tablet by mouth daily.              Allergies:  Diclofenac sodium, Latex, Penicillins, and Percocet [oxycodone-acetaminophen]  ______________________________________________________________________  Pending Test Results (if blank, then none):  Pending Labs       Order Current Status    Sirolimus Level In process    Tacrolimus Level, Timed In process            Most Recent Labs:  All lab results last 24 hours -   Recent Results (from the past 24 hour(s))   POCT Glucose    Collection Time: 08/29/23 12:30 PM   Result Value Ref Range    Glucose, POC 167 70 - 179 mg/dL   POCT Glucose    Collection Time: 08/29/23  4:35 PM   Result Value Ref Range    Glucose, POC 202 (H) 70 - 179 mg/dL   POCT Glucose    Collection Time: 08/29/23  6:35 PM   Result Value Ref Range    Glucose, POC 189 (H) 70 - 179 mg/dL   POCT Glucose    Collection Time: 08/29/23  8:28 PM   Result Value Ref Range    Glucose, POC 144 70 - 179 mg/dL   Basic Metabolic Panel    Collection Time: 08/30/23  6:20 AM Result Value  Ref Range    Sodium 138 135 - 145 mmol/L    Potassium 4.3 3.4 - 4.8 mmol/L    Chloride 104 98 - 107 mmol/L    CO2 27.0 20.0 - 31.0 mmol/L    Anion Gap 7 5 - 14 mmol/L    BUN 14 9 - 23 mg/dL    Creatinine 0.98 1.19 - 1.02 mg/dL    BUN/Creatinine Ratio 16     eGFR CKD-EPI (2021) Female 71 >=60 mL/min/1.10m2    Glucose 87 70 - 179 mg/dL    Calcium 9.3 8.7 - 14.7 mg/dL   Magnesium Level    Collection Time: 08/30/23  6:20 AM   Result Value Ref Range    Magnesium 1.7 1.6 - 2.6 mg/dL   POCT Glucose    Collection Time: 08/30/23  8:00 AM   Result Value Ref Range    Glucose, POC 118 70 - 179 mg/dL       Relevant Studies/Radiology (if blank, then none):  PVL Arterial Duplex Lower Extremity Bilateral Limited    Result Date: 08/23/2023    Peripheral Vascular Lab     [address]   [place], [place] [address]  PVL ARTERIAL DUPLEX LOWER EXTREMITY BILATERAL LIMITED Patient Demographics Pt. Name: Heidi Blankenship Location: Emergency Department MRN:      [id number]     Sex:      F DOB:      Apr 28, 1946   Age:      24 years  Study Information Authorizing         [id number] Cheryle Horsfall Performed Time       08/22/2023 9:02:00 Provider Name                                                  PM Ordering Physician  Cheryle Horsfall        Patient Location     [facility] Accession Number    [id number] UN         Technologist         Neita Garnet Diagnosis:                                Assisting                                           Technologist Ordered Reason For Exam: evaluation of BL iliac stents, left leg pain/groin pain  High Risk Factors: Non insulin dependent diabetes mellitus. Other Factors: H/o RLQ transplant kidney.  Vascular Interventions: H/o BLE stents at OSH.  Final Interpretation  Right  No significant obstruction detected by duplex scan.  RT great toe pressure = 108 mmHg.  Left Arterial obstruction involving the SFA and/or popliteal artery.  LT Great toe pressure = 54 mmHg.  Electronically signed by [id number] Jodell Cipro MD on 08/23/2023 at 8:27:40 AM. Examination Protocol: Systolic pressures are obtained brachial arteries bilaterally, and from the indicated ankle at the anterior tibial, and posterior tibial arteries. Ankle/brachial indices (ABIs) are calculated by dividing the ankle pressure obtained in each vessel by the higher brachial pressure. Physiologic waveforms are obtained and recorded. Toe PPG pressures/waveforms were obtained. Duplex scanning was selectively utilized to  scan segments of one or both extremities. Images were recorded to document color and PW spectral Doppler analysis as well as B-mode characteristics.  Arm Pressures 136 mmHgRight  Right ABIs  RT TBI 0.79 Great Toe 108 mmHg  Left ABIs  LT TBI 0.40 Great Toe 54 mmHg  Arterial wall calcification precludes accurate ankle pressures and ABIs. PPG tracings display appropriate pulsatility.  Right Summary Inflow: No evidence of hemodynamically significant inflow obstruction at rest. Outflow: No evidence of hemodynamically significant outflow obstruction at rest. Runoff: No evidence of hemodynamically significant runoff obstruction at rest.  Right Findings Peak Systolic Velocity 56 cm/s CFA 35 cm/s PFA 52 cm/s SFA prox 78 cm/s Pop A 83 cm/s ATA 73 cm/s PTA CFA acceleration time is 111 msec.  Left Summary Inflow: No evidence of hemodynamically significant inflow obstruction at rest. Outflow: Evidence of >75% stenosis in the popliteal artery by velocity ratio. Runoff: Unable to detect evidence of runoff disease. However, severity of proximal disease may limit the detection of distal disease. Left Findings: Peak Systolic Velocity 77 cm/s CFA 52 cm/s SFA dist 300 cm/s Pop A 56 cm/s ATA 48 cm/s PTA  CFA acceleration time is 89 msec.  A focal velocity elevation of 300 cm/s was obtained at popliteal artery with a VR of 6.5. Findings are characteristic of a >75% stenosis.   Final     MRI Pelvis Wo Contrast MSK    Result Date: 08/23/2023  EXAM: MRI PELVIS WO CONTRAST MSK DATE: 08/22/2023 8:27 PM ACCESSION: [id number] DICTATED: 08/22/2023 8:29 PM INTERPRETATION LOCATION: MAIN CAMPUS CLINICAL INDICATION: 77 years old Female with right groin pain (R>L), extends to the mid upper right thigh  COMPARISON: Multiple prior pelvis radiographs, CT 05/17/2023. TECHNIQUE: MRI of the pelvis was performed using a local coil.  Multisequence, multiplanar images were obtained without contrast.  FINDINGS:  Evaluation of the bone marrow reveals increased T2/STIR hyperintensity throughout both sacral ala and within the sacral promontory. There is a T1 dark and T2 bright 1.7 cm lesion within the right femoral neck that has well-defined margins and a narrow zone of transition that most likely represents a cyst or low-grade chondroid lesion. There is no fracture line or edema associated with this right femoral neck lesion. T2/STIR hyperintensity within the left pubis. Fracture line and marrow edema within the left superior and inferior pubic rami, minimally displaced. Intramuscular edema involving the left pelvic musculature including the gluteus minimus, iliacus, obturator internus, and proximal adductors. Bilateral hamstring origin tendinosis.  No evidence of bursitis. The sacroiliac joints and hip joints are approximated on the large field-of-view images. The sciatic nerves are normal in course and contour. Right lower quadrant transplant kidney appears unremarkable. Multilevel degenerative changes of the spine.     -Insufficiency fractures of the bilateral sacral ala and sacral promontory as well as the left pubic tubercle with robust marrow edema suggesting an acute to subacute process. -Minimally displaced fractures of the left superior and inferior pubic rami which appear acute to subacute. ==================== MODIFIED REPORT: (08/23/2023 6:43 AM) This report has been modified from its preliminary version; you may check the prior versions of radiology report, results history link for prior report versions (if they were previously visible in Epic). -----------------------------------------------    XR Pelvis 3 Or More Views    Result Date: 08/23/2023  EXAM: XR PELVIS 3 OR MORE VIEWS DATE: 08/23/2023 12:55 AM ACCESSION: [id number] UN DICTATED: 08/23/2023 2:52 AM INTERPRETATION LOCATION: Main Campus CLINICAL INDICATION: 77 years old Female with fx  COMPARISON: MRI pelvis 08/22/2023 and prior studies TECHNIQUE: AP, inlet, and outlet views of the pelvis.  FINDINGS: No new fractures. Redemonstrated mildly displaced fracture of the left superior pubic ramus. Nondisplaced left inferior pubic ramus fracture. Left greater than right sclerosis along the bilateral SI joints, compatible with bilateral sacral insufficiency fractures visualized on prior MRI. Approximation of the bilateral hip joints and the pubic symphysis. Diffuse osteopenia. Multiple surgical clips overlying the right pelvis. Redemonstrated biiliac stents.     No new fractures. Redemonstrated mildly displaced fracture of the left superior pubic ramus. Nondisplaced left inferior pubic ramus fracture. Left greater than right sclerosis along the bilateral SI joints, compatible with bilateral sacral insufficiency fractures.   ______________________________________________________________________  Discharge Instructions:   Activity Instructions       Activity as tolerated              Diet Instructions       Discharge diet (specify)      Discharge Nutrition Therapy: Regular            Other Instructions       Call MD for:  difficulty breathing, headache or visual disturbances      Call MD for:  extreme fatigue      Call MD for:  hives      Call MD for:  persistent dizziness or light-headedness      Call MD for:  persistent nausea or vomiting      Call MD for:  severe uncontrolled pain      Call MD for: Temperature > 38.5 Celsius ( > 101.3 Fahrenheit)      Discharge instructions      You will be contacted with a follow up appointment with the Orthopedists.  If you need to speak with them, the clinic can be reached at  [phone]    No dressing needed              Follow Up instructions and Outpatient Referrals     Call MD for:  difficulty breathing, headache or visual disturbances      Call MD for:  extreme fatigue      Call MD for:  hives      Call MD for:  persistent dizziness or light-headedness      Call MD for:  persistent nausea or vomiting      Call MD for:  severe uncontrolled pain      Call MD for: Temperature > 38.5 Celsius ( > 101.3 Fahrenheit)      Discharge instructions          Appointments which have been scheduled for you      Sep 06, 2023 10:15 AM  (Arrive by 10:00 AM)  RETURN  TRAUMA with Gretel Acre Maslow, PA  [facility] ORTHOPAEDICS [facility] [place] [facility] REGION) [address] [place] [address]  [phone]        Oct 26, 2023 9:30 AM  (Arrive by 9:15 AM)  RETURN NEPHROLOGY POST with Randal Ewing Schlein, MD  [facility] KIDNEY TRANSPL[facility] [place] [facility] REGION) [address]  [facility] 1 through 4  [place] [address]  [phone]             ______________________________________________________________________  Discharge Day Services:  BP 105/56  - Pulse 70  - Temp 36.6 ??C (97.9 ??F) (Oral)  - Resp 18  - Ht 157.5 cm (5' 2.01)  - Wt 66.9 kg (147 lb 7.8 oz)  -  LMP  (LMP Unknown)  - SpO2 98%  - BMI 26.97 kg/m??   Pt seen on the day of discharge and determined appropriate for discharge.    Condition at Discharge: good    Length of Discharge: I spent greater than 30 mins in the discharge of this patient.

## 2023-08-29 NOTE — Unmapped (Signed)
Medicine Daily Progress Note    Assessment/Plan:  Principal Problem:    Multiple closed fractures of pelvis without disruption of pelvic ring with delayed healing  Active Problems:    Type II diabetes mellitus (CMS-HCC)    Immunosuppression (CMS-HCC)           Heidi Blankenship is a 77 y.o. female who presented to [facility] with Multiple closed fractures of pelvis without disruption of pelvic ring with delayed healing.    Multiple closed pelvic fractures with delayed healing: Fractures occurred secondary to fall in May.  Patient initially been doing well but pain had progressed and she was having difficulty functioning at home over the 10 days preceding admission.  MRI showed insufficiency fractures of bilateral sacral alla as well as left pubic tubercle and superior and inferior pubic rami.  She was seen by Ortho with recommendations for nonoperative management.  -Scheduled Tylenol and lidocaine patch, PRN hydromorphone  -Continue bowel regimen while on opiates  -PT/OT    DM2: Uses an unusual regimen 25-28U NPH once daily with regular insulin once to twice daily only in the afternoon and evening.  Typically uses 7 to 10 units at 3 PM and they may repeat this based on sugars at 6 PM.  Most recent hemoglobin A1c 5.2%  -NPH 20U given low BG 9/2  -Slightly decreased regular insulin to 5U nightly from 6U  -SSI    Kidney transplant, immunosuppression: Follows with Dr. Margaretmary Bayley at [facility] clinic.  Transplant nephrology aware of admission.  -Continue home sirolimus, tacrolimus, prednisone  -AM sirolimus/Tac level    Hypertension:  - Continue lisinopril.    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  -Continue clopidogrel    Code status, HCDM:  -   FULL, Discussed with patient at the time of admission   -  HCDM (patient stated preference): Heidi Blankenship - Sister - [phone]     HCDM (patient stated preference): Heidi Blankenship - Sister - [phone]    I personally spent 35 minutes face-to-face and non-face-to-face in the care of this patient, which includes all pre, intra, and post visit time on the date of service.  All documented time was specific to the E/M visit and does not include any procedures that may have been performed.     ___________________________________________________________________    Subjective:  Lying in bed, remains eager for discharge/rehab.    Labs/Studies:  Labs and Studies from the last 24hrs per EMR and Reviewed    Objective:  Temp:  [36.7 ??C (98.1 ??F)-37 ??C (98.6 ??F)] 36.8 ??C (98.2 ??F)  Heart Rate:  [59-78] 60  Resp:  [16-18] 16  BP: (115-125)/(48-60) 125/52  SpO2:  [95 %-97 %] 97 %    GEN: Lying in bed, no acute distress  HEENT: sclera anicteric, moist mucous membranes  CV: RRR, no murmur  RESP: CTAB, normal WOB  ABD: soft, non-tender, BS normoactive, non-distended  EXT: no LE edema  NEURO: non-focal

## 2023-08-29 NOTE — Unmapped (Signed)
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    # Immunosuppression:  - Currently on sirolimus 1mg  q AM, tacrolimus 1.5/1mg , prednisone 5mg .   - Please obtain trough tacrolimus, sirolimus trough levels once a week prior to the morning dose of the medication.   - Tac trough goal of 2-4, Sirolimus trough goal of 3-8.  Lab Results   Component Value Date    TACROLIMUS 3.7 (L) 08/27/2023     Lab Results   Component Value Date    SIROLIMUS 4.1 08/27/2023     - These levels were an 8.5 hour and 20.5 hour troughs, respectively. These were likely at goal, would continue current sirolimus and tacrolimus.     # Blood Pressure / Volume:  - BP controlled on lisinopril.    # Infectious Prophylaxis and Monitoring:   - N/A  - EBV detected but unquantifiable 01/2023; neg CMV 7/12024    # Pelvic fractures  - Unclear whether sirolimus impedes bone healing (does for wounds) - will reach out to our pharmacists tomorrow.  - Evaluation and management per primary team.      RECOMMENDATIONS:   - Continue current immunosuppression.  - Please draw a renal panel and drug levels if still admitted tomorrow.  - Transplant patients with an open wound require wound care with sterile water only. The patient should be counseled on this at the time of discharge if they have not already been doing this.  - We will continue to follow.     Drinda Butts, MD  08/29/2023 1:38 PM     Medical decision-making for 08/29/23  Findings / Data     Patient has: []  acute illness w/systemic sxs  [mod]  []  two or more stable chronic illnesses [mod]  []  one chronic illness with acute exacerbation [mod]  []  acute complicated illness  [mod]  []  Undiagnosed new problem with uncertain prognosis  [mod] []  illness posing risk to life or bodily function (ex. AKI)  [high]  [x]  chronic illness with severe exacerbation/progression  [high]  []  chronic illness with severe side effects of treatment  [high] Hip fracture Probs At least 2:  Probs, Data, Risk   I reviewed: [x]  primary team note  []  consultant note(s)  []  external records [x]  chemistry results  []  CBC results  []  blood gas results  [x]  Other []  procedure/op note(s)   []  radiology report(s)  []  micro result(s)  []  w/ independent historian(s) Tac and sirolimus levels, Cr 0.9 ?3 Data Review (2 of 3)    I independently interpreted: []  Urine Sediment  []  Renal US []  CXR Images  []  CT Images  []  Other []  EKG Tracing  Any     I discussed: []  Pathology results w/ QHPs(s) from other specialties  []  Procedural findings w/ QHPs(s) from other specialties []  Imaging w/ QHP(s) from other specialties  [x]  Treatment plan w/ QHP(s) from other specialties Plan discussed with primary team Any     Mgm't requires: []  Prescription drug(s)  [mod]  []   Kidney biopsy  [mod]  []  Central line placement  [mod] [x]  High risk medication use and/or intensive toxicity monitoring [high]  []  Renal replacement therapy [high]  []  High risk kidney biopsy  [high]  []  Escalation of care  [high]  []  High risk central line placement  [high] Immunosuppression: high risk for infection Risk      _____________________________________________________________________________________    Transplant Background  Kidney Transplant History:   Date of Transplant: 10/20/2014 (Kidney)  Type of Transplant: Deceased  KDPI: 49%  Native Kidney Disease: Diabetes  Prior Transplants: None  Rejection Episodes: none    Outpatient baseline creatinine: 0.7 - 1.1      Subjective/Overnight events: No acute overnight events. Still having a lot of discomfort with ambulation. Plan to go to rehab near her home.    Physical Exam:   Vitals:    08/28/23 0829 08/28/23 1545 08/28/23 2353 08/29/23 0819   BP: 133/59 115/60 116/48 125/52   Pulse:  78 59 60   Resp:  18 18 16    Temp:  36.7 ??C (98.1 ??F) 37 ??C (98.6 ??F) 36.8 ??C (98.2 ??F)   TempSrc:  Oral Oral Oral   SpO2:  95% 96% 97%   Weight:       Height:         I/O this shift:  In: 120 [P.O.:120]  Out: 300 [Urine:300]    Intake/Output Summary (Last 24 hours) at 08/29/2023 1338  Last data filed at 08/29/2023 1133  Gross per 24 hour   Intake 120 ml   Output 300 ml   Net -180 ml       General: no acute distress  HEENT: anicteric sclera  CV: no peripheral edema  Lungs: normal work of breathing  Abdomen: non-distended  Skin: no visible lesions or rashes

## 2023-08-29 NOTE — Unmapped (Signed)
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    # Immunosuppression:  - Currently on sirolimus 1mg  q AM, tacrolimus 1.5/1mg , prednisone 5mg .   - Please obtain trough tacrolimus, sirolimus trough levels once a week prior to the morning dose of the medication.   - Tac trough goal of 2-4, Sirolimus trough goal of 3-8.  Lab Results   Component Value Date    TACROLIMUS 3.7 (L) 08/27/2023     Lab Results   Component Value Date    SIROLIMUS 4.1 08/27/2023     - These levels were an 8.5 hour and 20.5 hour troughs, respectively. These were likely at goal, would continue current sirolimus and tacrolimus levels.     # Blood Pressure / Volume:  - BP reasonably controlled on lisinopril.    # Infectious Prophylaxis and Monitoring:   - N/A  - EBV detected but unquantifiable 01/2023; neg CMV 7/12024    # Pelvic fractures  - Unclear whether sirolimus impedes bone healing (does for wounds) - will reach out to our pharmacists tomorrow.  - Evaluation and management per primary team.      RECOMMENDATIONS:   - Continue current immunosuppression, try to draw trough levels closer to 12/24 hours from previous dose.  - Transplant patients with an open wound require wound care with sterile water only. The patient should be counseled on this at the time of discharge if they have not already been doing this.  - We will continue to follow.     Drinda Butts, MD  08/28/2023 8:58 PM     Medical decision-making for 08/28/23  Findings / Data     Patient has: []  acute illness w/systemic sxs  [mod]  []  two or more stable chronic illnesses [mod]  []  one chronic illness with acute exacerbation [mod]  []  acute complicated illness  [mod]  []  Undiagnosed new problem with uncertain prognosis  [mod] []  illness posing risk to life or bodily function (ex. AKI)  [high]  [x]  chronic illness with severe exacerbation/progression  [high]  []  chronic illness with severe side effects of treatment  [high] Hip fracture Probs At least 2:  Probs, Data, Risk   I reviewed: [x]  primary team note  []  consultant note(s)  []  external records [x]  chemistry results  []  CBC results  []  blood gas results  [x]  Other []  procedure/op note(s)   []  radiology report(s)  []  micro result(s)  []  w/ independent historian(s) Tac and sirolimus levels, Cr 0.9 ?3 Data Review (2 of 3)    I independently interpreted: []  Urine Sediment  []  Renal US []  CXR Images  []  CT Images  []  Other []  EKG Tracing  Any     I discussed: []  Pathology results w/ QHPs(s) from other specialties  []  Procedural findings w/ QHPs(s) from other specialties []  Imaging w/ QHP(s) from other specialties  [x]  Treatment plan w/ QHP(s) from other specialties Plan discussed with primary team Any     Mgm't requires: []  Prescription drug(s)  [mod]  []   Kidney biopsy  [mod]  []  Central line placement  [mod] [x]  High risk medication use and/or intensive toxicity monitoring [high]  []  Renal replacement therapy [high]  []  High risk kidney biopsy  [high]  []  Escalation of care  [high]  []  High risk central line placement  [high] Immunosuppression: high risk for infection Risk      _____________________________________________________________________________________    Transplant Background  Kidney Transplant History:   Date of Transplant: 10/20/2014 (Kidney)  Type of Transplant: Deceased  KDPI: 49%  Native Kidney Disease: Diabetes  Prior Transplants: None  Rejection Episodes: none    Outpatient baseline creatinine: 0.7 - 1.1      Subjective/Overnight events: No acute overnight events. Misses her cat. Wants to go home but understands that rehab is the best option for her clinical condition.    Physical Exam:   Vitals:    08/27/23 2345 08/28/23 0727 08/28/23 0829 08/28/23 1545   BP: 116/51 140/63 133/59 115/60   Pulse: 52 75  78   Resp: 18 18  18    Temp: 36.7 ??C (98.1 ??F) 36.8 ??C (98.2 ??F)  36.7 ??C (98.1 ??F)   TempSrc: Oral Oral  Oral   SpO2: 94% 98%  95%   Weight:       Height:         No intake/output data recorded.  No intake or output data in the 24 hours ending 08/28/23 2058    General: no acute distress  HEENT: anicteric sclera  CV: no peripheral edema  Lungs: normal work of breathing  Abdomen: non-distended  Skin: no visible lesions or rashes

## 2023-08-29 NOTE — Unmapped (Signed)
Heidi Blankenship is a 77 y.o. female who presented to [facility] with Multiple closed fractures of pelvis without disruption of pelvic ring with delayed healing.     Multiple closed pelvic fractures with delayed healing: Fractures occurred secondary to fall in May.  Patient initially been doing well but pain had progressed and she was having difficulty functioning at home over the 10 days preceding admission.  Admission MRI showed insufficiency fractures of bilateral sacral alla as well as left pubic tubercle and superior and inferior pubic rami.  She was seen by Ortho with recommendations for nonoperative management. She declined scheduled Tylenol and was treated with lidocaine patch, PRN hydromorphone.  She was evaluated by PT/OT with recs for 5x weekly low therapy at discharge.     DM2: Uses an unusual regimen 25-28U NPH once daily with regular insulin once to twice daily only in the afternoon and evening.  Typically uses 7 to 10 units at 3 PM and they may repeat this based on sugars at 6 PM.  Most recent hemoglobin A1c 5.2% While admitted, NPH was reduced to 20U given low BG 9/2.  She will resume home regimen at discharge.     Kidney transplant, immunosuppression: Follows with Dr. Margaretmary Bayley at [facility] clinic.  Transplant nephrology aware of admission. Continued home sirolimus, tacrolimus, prednisone with level checks 9/5***     Hypertension:  Continued home lisinopril.     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 Continued home clopidogrel

## 2023-08-29 NOTE — Unmapped (Signed)
Problem: Skin Injury Risk Increased  Goal: Skin Health and Integrity  Outcome: Progressing     Problem: Adult Inpatient Plan of Care  Goal: Plan of Care Review  Outcome: Progressing  Flowsheets (Taken 08/29/2023 1658)  Progress: no change  Plan of Care Reviewed With: patient  Goal: Patient-Specific Goal (Individualized)  Outcome: Progressing  Goal: Absence of Hospital-Acquired Illness or Injury  Outcome: Progressing  Goal: Optimal Comfort and Wellbeing  Outcome: Progressing  Goal: Readiness for Transition of Care  Outcome: Progressing  Goal: Rounds/Family Conference  Outcome: Progressing     Problem: Fall Injury Risk  Goal: Absence of Fall and Fall-Related Injury  Outcome: Progressing     Problem: Latex Allergy  Goal: Absence of Allergy Symptoms  Outcome: Progressing     Problem: Self-Care Deficit  Goal: Improved Ability to Complete Activities of Daily Living  Outcome: Progressing     Problem: Pain Acute  Goal: Optimal Pain Control and Function  Outcome: Progressing     Problem: Comorbidity Management  Goal: Blood Glucose Levels Within Targeted Range  Outcome: Progressing

## 2023-08-30 LAB — BASIC METABOLIC PANEL
ANION GAP: 7 mmol/L (ref 5–14)
BLOOD UREA NITROGEN: 14 mg/dL (ref 9–23)
BUN / CREAT RATIO: 16
CALCIUM: 9.3 mg/dL (ref 8.7–10.4)
CHLORIDE: 104 mmol/L (ref 98–107)
CO2: 27 mmol/L (ref 20.0–31.0)
CREATININE: 0.85 mg/dL
EGFR CKD-EPI (2021) FEMALE: 71 mL/min/{1.73_m2} (ref >=60)
GLUCOSE RANDOM: 87 mg/dL (ref 70–179)
POTASSIUM: 4.3 mmol/L (ref 3.4–4.8)
SODIUM: 138 mmol/L (ref 135–145)

## 2023-08-30 LAB — TACROLIMUS LEVEL, TIMED: TACROLIMUS BLOOD: 3.3 ng/mL

## 2023-08-30 LAB — MAGNESIUM: MAGNESIUM: 1.7 mg/dL (ref 1.6–2.6)

## 2023-08-30 LAB — SIROLIMUS LEVEL: SIROLIMUS LEVEL BLOOD: 3.6 ng/mL (ref 3.0–20.0)

## 2023-08-30 MED ADMIN — sirolimus (RAPAMUNE) tablet 1 mg: 1 mg | ORAL | @ 13:00:00 | Stop: 2023-08-30

## 2023-08-30 MED ADMIN — levothyroxine (SYNTHROID) tablet 100 mcg: 100 ug | ORAL | @ 10:00:00 | Stop: 2023-08-30

## 2023-08-30 MED ADMIN — cholecalciferol (vitamin D3 25 mcg (1,000 units)) tablet 50 mcg: 50 ug | ORAL | @ 13:00:00 | Stop: 2023-08-30

## 2023-08-30 MED ADMIN — predniSONE (DELTASONE) tablet 5 mg: 5 mg | ORAL | @ 13:00:00 | Stop: 2023-08-30

## 2023-08-30 MED ADMIN — HYDROmorphone (DILAUDID) tablet 2 mg: 2 mg | ORAL | Stop: 2023-09-07

## 2023-08-30 MED ADMIN — clopidogrel (PLAVIX) tablet 75 mg: 75 mg | ORAL | @ 13:00:00 | Stop: 2023-08-30

## 2023-08-30 MED ADMIN — atorvastatin (LIPITOR) tablet 20 mg: 20 mg | ORAL | @ 13:00:00 | Stop: 2023-08-30

## 2023-08-30 MED ADMIN — tacrolimus (PROGRAF) capsule 1.5 mg: 1.5 mg | ORAL | @ 13:00:00 | Stop: 2023-08-30

## 2023-08-30 MED ADMIN — pantoprazole (Protonix) EC tablet 40 mg: 40 mg | ORAL | @ 13:00:00 | Stop: 2023-08-30

## 2023-08-30 MED ADMIN — tacrolimus (PROGRAF) capsule 1 mg: 1 mg | ORAL

## 2023-08-30 MED ADMIN — insulin NPH (HumuLIN,NovoLIN) injection 20 Units: 20 [IU] | SUBCUTANEOUS | @ 13:00:00 | Stop: 2023-08-30

## 2023-08-30 NOTE — Unmapped (Signed)
Patient received in bed conscious aware and in no noted distress. Medicated and diet taken and tolerated. Patient was seen by doctor on duty and was discharge to SNF. Patient is aware and got ready to leave facility. IV line removed with no issues. BLS arrived and was picked up. Effort to call facility to give report was futile. Patient left unit in a stable condition.    Problem: Skin Injury Risk Increased  Goal: Skin Health and Integrity  Outcome: Ongoing - Unchanged  Intervention: Optimize Skin Protection  Recent Flowsheet Documentation  Taken 08/30/2023 0815 by Vonzell Schlatter, Luiz Blare, RN  Activity Management: up to bedside commode  Pressure Reduction Techniques: frequent weight shift encouraged  Head of Bed (HOB) Positioning: HOB at 30-45 degrees  Pressure Reduction Devices: pressure-redistributing mattress utilized  Skin Protection: adhesive use limited     Problem: Adult Inpatient Plan of Care  Goal: Plan of Care Review  Outcome: Ongoing - Unchanged  Goal: Patient-Specific Goal (Individualized)  Outcome: Ongoing - Unchanged  Goal: Absence of Hospital-Acquired Illness or Injury  Outcome: Ongoing - Unchanged  Intervention: Identify and Manage Fall Risk  Recent Flowsheet Documentation  Taken 08/30/2023 1400 by Bertell Maria, RN  Safety Interventions: fall reduction program maintained  Taken 08/30/2023 1200 by Bertell Maria, RN  Safety Interventions: fall reduction program maintained  Taken 08/30/2023 1000 by Bertell Maria, RN  Safety Interventions: fall reduction program maintained  Taken 08/30/2023 0815 by Vonzell Schlatter, Luiz Blare, RN  Safety Interventions: fall reduction program maintained  Intervention: Prevent Skin Injury  Recent Flowsheet Documentation  Taken 08/30/2023 0815 by Bertell Maria, RN  Positioning for Skin: Supine/Back  Device Skin Pressure Protection: absorbent pad utilized/changed  Skin Protection: adhesive use limited  Intervention: Prevent and Manage VTE (Venous Thromboembolism) Risk  Recent Flowsheet Documentation  Taken 08/30/2023 0815 by Vonzell Schlatter, Luiz Blare, RN  VTE Prevention/Management: ambulation promoted  Goal: Optimal Comfort and Wellbeing  Outcome: Ongoing - Unchanged  Goal: Readiness for Transition of Care  Outcome: Ongoing - Unchanged  Goal: Rounds/Family Conference  Outcome: Ongoing - Unchanged     Problem: Fall Injury Risk  Goal: Absence of Fall and Fall-Related Injury  Outcome: Ongoing - Unchanged  Intervention: Promote Injury-Free Environment  Recent Flowsheet Documentation  Taken 08/30/2023 1400 by Bertell Maria, RN  Safety Interventions: fall reduction program maintained  Taken 08/30/2023 1200 by Bertell Maria, RN  Safety Interventions: fall reduction program maintained  Taken 08/30/2023 1000 by Bertell Maria, RN  Safety Interventions: fall reduction program maintained  Taken 08/30/2023 0815 by Vonzell Schlatter, Luiz Blare, RN  Safety Interventions: fall reduction program maintained     Problem: Latex Allergy  Goal: Absence of Allergy Symptoms  Outcome: Ongoing - Unchanged     Problem: Self-Care Deficit  Goal: Improved Ability to Complete Activities of Daily Living  Outcome: Ongoing - Unchanged     Problem: Pain Acute  Goal: Optimal Pain Control and Function  Outcome: Ongoing - Unchanged     Problem: Comorbidity Management  Goal: Blood Glucose Levels Within Targeted Range  Outcome: Ongoing - Unchanged

## 2023-08-30 NOTE — Unmapped (Signed)
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   - Please obtain trough tacrolimus, sirolimus trough levels once a week prior to the morning dose of the medication.   - Tac trough goal of 2-4, Sirolimus trough goal of 3-8.  - continue current sirolimus and tacrolimus.     # Blood Pressure / Volume:  - BP controlled on lisinopril.    # Infectious Prophylaxis and Monitoring:   - N/A  - EBV detected but unquantifiable 01/2023; neg CMV 7/12024    # Pelvic fractures  - Evaluation and management per primary team, planned for discharge to rehab    RECOMMENDATIONS:   - Continue current immunosuppression.  - Transplant patients with an open wound require wound care with sterile water only. The patient should be counseled on this at the time of discharge if they have not already been doing this.     Worthy Keeler, DO  08/30/2023 1:36 PM     Medical decision-making for 08/30/23  Findings / Data     Patient has: []  acute illness w/systemic sxs  [mod]  []  two or more stable chronic illnesses [mod]  []  one chronic illness with acute exacerbation [mod]  []  acute complicated illness  [mod]  []  Undiagnosed new problem with uncertain prognosis  [mod] []  illness posing risk to life or bodily function (ex. AKI)  [high]  [x]  chronic illness with severe exacerbation/progression  [high]  []  chronic illness with severe side effects of treatment [high] Hip fracture Probs At least 2:  Probs, Data, Risk   I reviewed: [x]  primary team note  []  consultant note(s)  []  external records [x]  chemistry results  []  CBC results  []  blood gas results  [x]  Other []  procedure/op note(s)   []  radiology report(s)  []  micro result(s)  []  w/ independent historian(s) Tac and sirolimus levels, Cr 0.9 ?3 Data Review (2 of 3)    I independently interpreted: []  Urine Sediment  []  Renal US []  CXR Images  []  CT Images  []  Other []  EKG Tracing  Any     I discussed: []  Pathology results w/ QHPs(s) from other specialties  []  Procedural findings w/ QHPs(s) from other specialties []  Imaging w/ QHP(s) from other specialties  [x]  Treatment plan w/ QHP(s) from other specialties Plan discussed with primary team Any     Mgm't requires: []  Prescription drug(s)  [mod]  []  Kidney biopsy  [mod]  []  Central line placement  [mod] [x]  High risk medication use and/or intensive toxicity monitoring [high]  []  Renal replacement therapy [high]  []  High risk kidney biopsy  [high]  []  Escalation of care  [high]  []  High risk central line placement  [high] Immunosuppression: high risk for infection Risk      _______________________________________________________  Transplant Background  Kidney Transplant History:   Date of Transplant: 10/20/2014 (Kidney)  Type of Transplant: Deceased  KDPI: 49%  Native Kidney Disease: Diabetes  Prior Transplants: None  Rejection Episodes: none    Outpatient baseline creatinine: 0.7 - 1.1    Subjective/Overnight events: No acute overnight events. Pending discharge to rehab per primary team.    Physical Exam:   Vitals:    08/29/23 0819 08/29/23 1631 08/30/23 0022 08/30/23 0758   BP: 125/52 135/74 148/59 105/56   Pulse: 60 74 69 70   Resp: 16 16 18     Temp: 36.8 ??C (98.2 ??F) 36.7 ??C (98.1 ??F) 36.5 ??C (97.7 ??F) 36.6 ??C (97.9 ??F)   TempSrc: Oral Oral Oral Oral   SpO2: 97% 94% 98% 98%   Weight:       Height:         I/O this shift:  In: 200 [P.O.:200]  Out: -     Intake/Output Summary (Last 24 hours) at 08/30/2023 1336  Last data filed at 08/30/2023 0759  Gross per 24 hour   Intake 200 ml   Output 0 ml   Net 200 ml     General: no acute distress  HEENT: anicteric sclera  CV: no peripheral edema  Lungs: normal work of breathing  Abdomen: non-distended  Skin: no visible lesions or rashes

## 2023-08-31 NOTE — Unmapped (Signed)
CONTINUING CARE NETWORK  ENROLLMENT NOTE  Summary:  TONNYA KOHRT is a 77 y.o. female that was admitted to [facility] and is being followed by the [facility]. Case Management completed a chart review and medication reconciliation. Plan is continue rehab. Case Manager to follow up with SNF care team and patient in one week to identify barriers and discuss transition planning.       Discuss at next visit: Transition planning     Objective:    Patient Active Problem List   Diagnosis    Type II diabetes mellitus (CMS-HCC)    Hyperlipidemia    Proliferative diabetic retinopathy (CMS-HCC)    Aftercare following organ transplant    Hypothyroidism    Quit smoking within past year    Immunocompromised state (CMS-HCC)    Diabetes mellitus type 2 in obese    Immunosuppression (CMS-HCC)    Breast swelling    Thrombocytosis    Erythrocytosis    History of nonmelanoma skin cancer    Multiple closed fractures of pelvis without disruption of pelvic ring with delayed healing       Future Appointments   Date Time Provider Department Center   09/06/2023 10:15 AM Maslow, Gretel Acre, PA [facility]   10/26/2023  9:30 AM Detwiler, Lillia Carmel, MD [facility]

## 2023-09-03 DIAGNOSIS — Z940 Kidney transplant status: Principal | ICD-10-CM

## 2023-09-03 DIAGNOSIS — Z79899 Other long term (current) drug therapy: Principal | ICD-10-CM

## 2023-09-11 NOTE — Unmapped (Signed)
CONTINUING CARE NETWORK  SNF DISCHARGE NOTE  Summary:  Heidi Blankenship is a 77 y.o. female patient that discharged from [facility] on 09/06/23 Pt has completed services with the [facility].    Discharge disposition is AMA    Subjective:   Discharge summary and instructions uploaded to EPIC: No    Barriers to care: Fall Risk  Care Coordination Note updated in [facility]: Yes    Care coordination task assigned to [facility] by CCN CM  . Requested Services: OtherPCP follow up appt please  .     Objective:     Patient Active Problem List   Diagnosis    Type II diabetes mellitus (CMS-HCC)    Hyperlipidemia    Proliferative diabetic retinopathy (CMS-HCC)    Aftercare following organ transplant    Hypothyroidism    Quit smoking within past year    Immunocompromised state (CMS-HCC)    Diabetes mellitus type 2 in obese    Immunosuppression (CMS-HCC)    Breast swelling    Thrombocytosis    Erythrocytosis    History of nonmelanoma skin cancer    Multiple closed fractures of pelvis without disruption of pelvic ring with delayed healing        Allergies:   Allergies   Allergen Reactions    Diclofenac Sodium Diarrhea and Nausea And Vomiting    Latex Itching    Penicillins Rash    Percocet [Oxycodone-Acetaminophen] Nausea And Vomiting       Medications:  Current Outpatient Medications   Medication Sig Dispense Refill    acetaminophen (TYLENOL 8 HOUR) 650 MG CR tablet Take 2 tablets (1,300 mg total) by mouth every eight (8) hours as needed for pain.      atorvastatin (LIPITOR) 20 MG tablet Take 1 tablet (20 mg total) by mouth daily. 30 tablet 11    cholecalciferol, vitamin D3, (VITAMIN D3) 2,000 unit Tab Take 1 tablet by mouth daily. 90 each 5    clopidogreL (PLAVIX) 75 mg tablet Take 1 tablet (75 mg total) by mouth daily.      HYDROmorphone (DILAUDID) 2 MG tablet Take 1 tablet (2 mg total) by mouth every four (4) hours as needed for moderate pain or severe pain. 6 tablet     levothyroxine (SYNTHROID) 100 MCG tablet TAKE 1 TABLET BY MOUTH EVERY DAY (Patient taking differently: Take 1 tablet (100 mcg total) by mouth. Taking 100 mcg daily x 6 days/week.) 90 tablet 11    lidocaine (LIDODERM) 5 % patch Place 1 patch on the skin daily. Apply to affected area for 12 hours only each day (then remove patch)      lisinopril (PRINIVIL,ZESTRIL) 10 MG tablet Take 1 tablet (10 mg total) by mouth daily. 90 tablet 3    LUTEIN ORAL Take 1 tablet by mouth daily.      NOVOLIN N NPH U-100 INSULIN 100 unit/mL injection 0.25-0.28 mL (25-28 Units total) daily. Takes 25 to 28 units in am depending on sugar      NOVOLIN R REGULAR U100 INSULIN 100 unit/mL injection Inject 0.07-0.1 mL (7-10 Units total) under the skin two (2) times a day. Takes 3 pm and again near 6 pm if needed      omeprazole (PRILOSEC) 40 MG capsule Take 1 capsule (40 mg total) by mouth every other day.      phytonadione, vit K1, (VITAMIN K ORAL) Take 1 tablet by mouth in the morning.      predniSONE (DELTASONE) 5 MG tablet  Take 1 tablet (5 mg total) by mouth daily. 30 tablet 11    sirolimus (RAPAMUNE) 0.5 mg tablet Take 2 tablets (1 mg total) by mouth in the morning. 180 tablet 3    tacrolimus (PROGRAF) 0.5 MG capsule TAKE 3 CAPSULES (1.5 MG) IN THE MORNING AND 2 CAPSULES (1 MG) IN THE EVENING 150 capsule 6    ZINC ORAL Take 1 tablet by mouth daily.       No current facility-administered medications for this visit.        Future Appointments   Date Time Provider Department Center   09/13/2023 10:45 AM Maslow, Gretel Acre, PA [facility]   10/26/2023  9:30 AM Detwiler, Lillia Carmel, MD [facility]

## 2023-09-13 ENCOUNTER — Ambulatory Visit: Admit: 2023-09-13 | Discharge: 2023-09-14 | Payer: MEDICARE

## 2023-09-13 NOTE — Unmapped (Signed)
ORTHOPAEDIC NOTE     Lev Cervone L. Brenten Janney, PA-C        Heidi Blankenship    MRN: [id number]  DOB: 04-09-1946    Date of visit: 09/13/2023    Clinic location:      ASSESSMENT:     Stable alignment of left superior and inferior pubic rami fractures symptomatic since 08/19/2023     PLAN:     -Patient understands that the current fracture appears stable and is amenable to non operative treatment if there is no change in alignment  I have recommended weightbearing as tolerated using her cane  She will continue working with non weightbearing strengthening exercises  I recommend she speak with her primary care physician regarding bone density scan  -Advised OTC analgesic PRN pain  -Discussed treatment options and patient was amenable to the above plan and was instructed to call and be seen if there is any increasing pain or concerns.     Follow up: 4 weeks, AP inlet outlet views of the pelvis       Chief Complaint:     Recheck pelvis     SUBJECTIVE:     HPI: Heidi Blankenship is a  77 y.o. 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  I was treating her for a right sacral insufficiency on 04/27/2023 fracture with suspected left sacral insufficiency fracture on 05/17/2023 who started having severe pain on 08/19/2023.  She presented to the emergency department and images revealed left pubic rami and parasymphyseal fractures with nondisplaced sacral fractures.  She was discharged weightbearing as tolerated bilateral lower extremities.  She went to a rehab facility but states the therapist only made her walk twice in 1 week and she feels she can do this at home and left.  She is now using a cane instead of a walker and states that her pain is improving.       Allergies  Allergies   Allergen Reactions    Diclofenac Sodium Diarrhea and Nausea And Vomiting    Latex Itching    Penicillins Rash    Percocet [Oxycodone-Acetaminophen] Nausea And Vomiting     Past Medical History  Past Medical History:   Diagnosis Date    Arthritis Oct. 2020    Pain & swelling. 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)     History of nonmelanoma skin cancer 05/31/2015    Kidney transplant recipient     Lymphedema     RUL and RLL    PAD (peripheral artery disease) (CMS-HCC)     Bilateral iliac stents, right femoral and popliteal stents    SDH (subdural hematoma) (CMS-HCC)     After fall 2022, did not require surgery        PHYSICAL EXAM:     MSK: Pelvis and bilateral lower extremities  Inspection: No edema, no erythema, skin intact  Palpation: Mild pain when I palpate throughout the left pubic rami  ROM: Full range of motion of the left  Strength: Adequate symmetric strength bilateral lower extremities  normal sensation RLE LLE  Dorsalis pedal pulses easily palpable  Imaging   Three views of the pelvis AP, Inlet, Outlet independently reviewed and interpreted by myself show mildly displaced left superior and inferior pubic rami fractures with vascular calcifications. No other obvious fractures, lucencies, dislocations, or acute abnormalities.    MEDICAL DECISION MAKING (level of service defined by 2/3 elements)     Number/Complexity of Problems Addressed 1 acute, uncomplicated illness or injury (99203/99213)   Amount/Complexity of Data to be Reviewed/Analyzed Independent interpretation of a test performed by another physician/other qualified health care professional (99204/99214)   Risk of Complications/Morbidity/Mortality of Management Closed Fracture Treatment WITHOUT Manipulation (99204/99214)   DME ORDER:  Dx:  ,                   cc:  Jaclyn Shaggy, MD  *Patient note was created using Dragon Dictation sotware. Errors in syntax or grammar may not have been identified and edited on initial review.

## 2023-09-17 NOTE — Unmapped (Signed)
[facility] Refill Coordination Note    Specialty Medication(s) to be Shipped:   Transplant: sirolimus 0.5 mg    Other medication(s) to be shipped: No additional medications requested for fill at this time     Heidi Blankenship, DOB: [birth year]  Phone: There are no phone numbers on file.      All above HIPAA information was verified with patient.     Was a Nurse, learning disability used for this call? No    Completed refill call assessment today to schedule patient's medication shipment from the [facility]  [phone]).  All relevant notes have been reviewed.     Specialty medication(s) and dose(s) confirmed: Regimen is correct and unchanged.   Changes to medications: Biannca reports no changes at this time.  Changes to insurance: No  New side effects reported not previously addressed with a pharmacist or physician: None reported  Questions for the pharmacist: No    Confirmed patient received a Conservation officer, historic buildings and a Surveyor, mining with first shipment. The patient will receive a drug information handout for each medication shipped and additional FDA Medication Guides as required.       DISEASE/MEDICATION-SPECIFIC INFORMATION        N/A    SPECIALTY MEDICATION ADHERENCE     Medication Adherence    Patient reported X missed doses in the last month: 0  Specialty Medication: sirolimus 0.5 mg tablet (RAPAMUNE)  Patient is on additional specialty medications: No              Were doses missed due to medication being on hold? No    sirolimus 0.5 mg: 10 days of medicine on hand       REFERRAL TO PHARMACIST     Referral to the pharmacist: Not needed      [facility]     Shipping address confirmed in Epic.       Delivery Scheduled: Yes, Expected medication delivery date: 09/26/23.     Medication will be delivered via Same Day Courier to the prescription address in Epic WAM.    Quintella Reichert   [facility]  Specialty Technician

## 2023-09-24 DIAGNOSIS — Z79899 Other long term (current) drug therapy: Principal | ICD-10-CM

## 2023-09-24 DIAGNOSIS — Z940 Kidney transplant status: Principal | ICD-10-CM

## 2023-09-25 MED FILL — SIROLIMUS 0.5 MG TABLET: ORAL | 30 days supply | Qty: 60 | Fill #8

## 2023-10-01 DIAGNOSIS — Z79899 Other long term (current) drug therapy: Principal | ICD-10-CM

## 2023-10-01 DIAGNOSIS — Z940 Kidney transplant status: Principal | ICD-10-CM

## 2023-10-11 ENCOUNTER — Ambulatory Visit: Admit: 2023-10-11 | Discharge: 2023-10-12 | Payer: MEDICARE

## 2023-10-11 NOTE — Unmapped (Signed)
ORTHOPAEDIC NOTE     Jakerra Floyd L. Ashyr Hedgepath, PA-C        Heidi Blankenship    MRN: [id number]  DOB: [birth year]    Date of visit: 10/11/2023    Clinic location: [place]     ASSESSMENT:     Slowly progressing with left superior and inferior pubic rami fractures sustained on 08/19/2023     PLAN:     She will continue weightbearing as tolerated using a cane but try not to do excessive activities  She will continue on OTC calcium and vitamin D  She will speak with her primary care physician regarding bone density scan  -Advised OTC analgesic PRN pain  -Discussed treatment options and patient was amenable to the above plan and was instructed to call and be seen if there is any increasing pain or concerns.     Follow up: 6 weeks, AP inlet outlet views of the pelvis       Chief Complaint:     Recheck pelvis fracture     SUBJECTIVE:     HPI: Heidi Blankenship is a  77 y.o. 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  She been weightbearing as tolerated with a cane working on strengthening exercises.  Overall she states that she is getting better but she did overdo it the other day and had increasing pain in her left groin.  Denies numbness or tingling to the leg.       Allergies  Allergies   Allergen Reactions    Diclofenac Sodium Diarrhea and Nausea And Vomiting    Latex Itching    Penicillins Rash    Percocet [Oxycodone-Acetaminophen] Nausea And Vomiting     Past Medical History  Past Medical History:   Diagnosis Date    Arthritis Oct. 2020    Pain & swelling. 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)     History of nonmelanoma skin cancer 05/31/2015 Kidney transplant recipient     Lymphedema     RUL and RLL    PAD (peripheral artery disease) (CMS-HCC)     Bilateral iliac stents, right femoral and popliteal stents    SDH (subdural hematoma) (CMS-HCC)     After fall 2022, did not require surgery        PHYSICAL EXAM:     MSK: Pelvis and bilateral lower extremities  Inspection: No edema, no erythema, skin intact  Palpation: No pain when I palpate throughout the pelvis and hip  ROM: Full range of motion of the hip  Strength: Full strength in all planes range of motion of left lower extremity  Negative Homans' sign  normal sensation RLE LLE  Dorsalis pedal pulses easily palpable      Imaging   Three views of the pelvis AP, Inlet, Outlet independently reviewed and interpreted by myself show callus formation along the displaced left superior and inferior pubic rami fractures. No other obvious fractures, lucencies, dislocations, or acute abnormalities.    MEDICAL DECISION MAKING (level of service defined by 2/3 elements)     Number/Complexity of Problems  Addressed 1 acute, uncomplicated illness or injury (99203/99213)   Amount/Complexity of Data to be Reviewed/Analyzed Independent interpretation of a test performed by another physician/other qualified health care professional (99204/99214)   Risk of Complications/Morbidity/Mortality of Management Over-the-counter Medications (99203/99213)   DME ORDER:  Dx:  ,                   cc:  Jaclyn Shaggy, MD  *Patient note was created using Dragon Dictation sotware. Errors in syntax or grammar may not have been identified and edited on initial review.

## 2023-10-16 NOTE — Unmapped (Signed)
[facility] Delivery Pharm[facility] Refill Coordination Note    Specialty Medication(s) to be Shipped:   Transplant: sirolimus 0.5mg     Other medication(s) to be shipped: No additional medications requested for fill at this time     Heidi Blankenship, DOB: 03/02/1946  Phone: There are no phone numbers on file.      All above HIPAA information was verified with patient.     Was a Nurse, learning disability used for this call? No    Completed refill call assessment today to schedule patient's medication shipment from the [facility] Delivery [facility]  [phone]).  All relevant notes have been reviewed.     Specialty medication(s) and dose(s) confirmed: Regimen is correct and unchanged.   Changes to medications: Heidi Blankenship reports no changes at this time.  Changes to insurance: No  New side effects reported not previously addressed with a pharmacist or physician: None reported  Questions for the pharmacist: No    Confirmed patient received a Conservation officer, historic buildings and a Surveyor, mining with first shipment. The patient will receive a drug information handout for each medication shipped and additional FDA Medication Guides as required.       DISEASE/MEDICATION-SPECIFIC INFORMATION        N/A    SPECIALTY MEDICATION ADHERENCE     Medication Adherence    Patient reported X missed doses in the last month: 0  Specialty Medication: Sirolimus 0.5mg   Patient is on additional specialty medications: No              Were doses missed due to medication being on hold? No    Sirolimus 0.5 mg: 10 days of medicine on hand     REFERRAL TO PHARMACIST     Referral to the pharmacist: Not needed      [facility]     Shipping address confirmed in Epic.       Delivery Scheduled: Yes, Expected medication delivery date: 10/25/23.     Medication will be delivered via Next Day Courier to the prescription address in Epic WAM.    Tera Helper, [facility]   [facility]  Specialty Pharmacist

## 2023-10-22 DIAGNOSIS — Z79899 Other long term (current) drug therapy: Principal | ICD-10-CM

## 2023-10-22 DIAGNOSIS — Z940 Kidney transplant status: Principal | ICD-10-CM

## 2023-10-24 MED FILL — SIROLIMUS 0.5 MG TABLET: ORAL | 30 days supply | Qty: 60 | Fill #9

## 2023-10-29 ENCOUNTER — Encounter (INDEPENDENT_AMBULATORY_CARE_PROVIDER_SITE_OTHER): Payer: Self-pay | Admitting: Vascular Surgery

## 2023-10-29 ENCOUNTER — Ambulatory Visit (INDEPENDENT_AMBULATORY_CARE_PROVIDER_SITE_OTHER): Payer: Medicare HMO | Admitting: Vascular Surgery

## 2023-10-29 ENCOUNTER — Ambulatory Visit (INDEPENDENT_AMBULATORY_CARE_PROVIDER_SITE_OTHER): Payer: Medicare HMO

## 2023-10-29 VITALS — BP 159/71 | HR 71 | Resp 18 | Ht 61.0 in | Wt 153.6 lb

## 2023-10-29 DIAGNOSIS — Z79899 Other long term (current) drug therapy: Principal | ICD-10-CM

## 2023-10-29 DIAGNOSIS — Z940 Kidney transplant status: Principal | ICD-10-CM

## 2023-10-29 DIAGNOSIS — I10 Essential (primary) hypertension: Secondary | ICD-10-CM | POA: Diagnosis not present

## 2023-10-29 DIAGNOSIS — E785 Hyperlipidemia, unspecified: Secondary | ICD-10-CM

## 2023-10-29 DIAGNOSIS — I70213 Atherosclerosis of native arteries of extremities with intermittent claudication, bilateral legs: Secondary | ICD-10-CM

## 2023-10-29 DIAGNOSIS — E1169 Type 2 diabetes mellitus with other specified complication: Secondary | ICD-10-CM

## 2023-10-29 DIAGNOSIS — M4716 Other spondylosis with myelopathy, lumbar region: Secondary | ICD-10-CM

## 2023-10-29 DIAGNOSIS — Z794 Long term (current) use of insulin: Secondary | ICD-10-CM

## 2023-10-29 DIAGNOSIS — E1159 Type 2 diabetes mellitus with other circulatory complications: Secondary | ICD-10-CM

## 2023-10-29 LAB — VAS US ABI WITH/WO TBI

## 2023-10-29 NOTE — Progress Notes (Signed)
MRN : [id number]  Cindy Fischer is a 77 y.o. ([birth year]) female who presents with chief complaint of check circulation.  History of Present Illness:   The patient returns to the office for followup and review of the noninvasive studies. There have been no interval changes in lower extremity symptoms. No interval shortening of the patient's claudication distance or development of rest pain symptoms. No new ulcers or wounds have occurred since the last visit.   Since her last visit she has had a subdural hematoma and several episodes of hypoglycemia.  These are currently being treated.   The patient denies amaurosis fugax or recent TIA symptoms. There are no recent neurological changes noted. The patient denies history of DVT, PE or superficial thrombophlebitis. The patient denies recent episodes of angina or shortness of breath.    ABI Rt=[place] (TBI=0.32) and Lt=[place] (TBI=0.23)  (previous ABI's Rt=[place] (TBI=0.86) and Lt=[place] (TBI=0.90) )   Duplex ultrasound of the bilateral lower extremity arterial system demonstrates patency without focal hemodynamically significant stenosis.  Current Meds  Medication Sig   acetaminophen (TYLENOL) 650 MG CR tablet Take 1,300 mg by mouth every 8 (eight) hours as needed for pain.   aspirin EC 81 MG tablet Take 81 mg by mouth daily.   atorvastatin (LIPITOR) 20 MG tablet Take 20 mg by mouth daily.   Cholecalciferol (VITAMIN D) 2000 units tablet Take 2,000 Units by mouth daily.   clopidogrel (PLAVIX) 75 MG tablet Take 1 tablet (75 mg total) by mouth daily.   insulin NPH Human (HUMULIN N,NOVOLIN N) 100 UNIT/ML injection Inject 23 Units into the skin daily before breakfast.   insulin regular (NOVOLIN R,HUMULIN R) 100 units/mL injection Inject 3-10 Units into the skin 3 (three) times daily as needed for high blood sugar. Sliding scale   levothyroxine (SYNTHROID, LEVOTHROID) 100 MCG tablet Take 100  mcg by mouth See admin instructions. Take 100 mcg daily except skip dose on Saturdays   lisinopril (PRINIVIL,ZESTRIL) 10 MG tablet Take 10 mg by mouth daily.   omeprazole (PRILOSEC) 40 MG capsule Take 40 mg by mouth every other day.    predniSONE (DELTASONE) 5 MG tablet Take 5 mg by mouth daily.    Sirolimus (RAPAMUNE) 0.5 MG tablet Take by mouth.   tacrolimus (PROGRAF) 0.5 MG capsule Take 1-1.5 mg by mouth See admin instructions. Take 1.5 mg in the morning and 1 mg in the evening   traMADol (ULTRAM) 50 MG tablet Take 1 tablet (50 mg total) by mouth every 12 (twelve) hours as needed.    Past Medical History:  Diagnosis Date   Cancer (HCC) 2016   skin (nose)   Diabetes mellitus without complication (HCC)    End stage renal disease (HCC)    s/p renal transplant   Hypothyroidism    Pneumonia    Renal disorder     Past Surgical History:  Procedure Laterality Date   CATARACT EXTRACTION, BILATERAL     COLONOSCOPY  12/02/2010   COLONOSCOPY WITH PROPOFOL N/A 09/04/2016   Procedure: COLONOSCOPY WITH PROPOFOL;  Surgeon: Kieth Brightly, MD;  Location: [facility] ENDOSCOPY;  Service: Endoscopy;  Laterality: N/A;   EYE SURGERY     INSERTION OF DIALYSIS CATHETER  2014   removed 2014 (only had in for about 8 weeks)   laser surgery on eyes  1999   LOWER EXTREMITY ANGIOGRAPHY Right 10/16/2018   Procedure: LOWER EXTREMITY ANGIOGRAPHY;  Surgeon: Renford Dills, MD;  Location: [facility] INVASIVE CV LAB;  Service: Cardiovascular;  Laterality: Right;   LOWER EXTREMITY ANGIOGRAPHY Right 02/08/2021   Procedure: LOWER EXTREMITY ANGIOGRAPHY;  Surgeon: Renford Dills, MD;  Location: [facility] INVASIVE CV LAB;  Service: Cardiovascular;  Laterality: Right;   LOWER EXTREMITY ANGIOGRAPHY Right 03/02/2021   Procedure: LOWER EXTREMITY ANGIOGRAPHY;  Surgeon: Renford Dills, MD;  Location: [facility] INVASIVE CV LAB;  Service: Cardiovascular;  Laterality: Right;   NEPHRECTOMY TRANSPLANTED ORGAN  10/20/2014   SPINE  SURGERY  11/10/2021   TONSILLECTOMY     TUBAL LIGATION      Social History Social History   Tobacco Use   Smoking status: Former    Current packs/day: 0.00    Average packs/day: 1 pack/day for 35.0 years (35.0 ttl pk-yrs)    Types: Cigarettes    Start date: 80    Quit date: 2015    Years since quitting: 9.8   Smokeless tobacco: Never  Vaping Use   Vaping status: Never Used  Substance Use Topics   Alcohol use: No   Drug use: No    Family History Family History  Problem Relation Age of Onset   Multiple myeloma Mother    Diabetes Father    Heart attack Father 8   Breast cancer Neg Hx     Allergies  Allergen Reactions   Latex Itching    Patient states that she is NOT allergic to latex - 05/26/2023   Voltaren [Diclofenac Sodium] Diarrhea and Nausea And Vomiting    Patient states that she is NOT allergic to Voltaren - 05/26/2023   Oxycodone-Acetaminophen Nausea And Vomiting    Patient states that she is NOT allergic to Oxycodone-acetaminopehn - 05/26/2023   Penicillins Rash    Has patient had a PCN reaction causing immediate rash, facial/tongue/throat swelling, SOB or lightheadedness with hypotension: Yes Has patient had a PCN reaction causing severe rash involving mucus membranes or skin necrosis: No Has patient had a PCN reaction that required hospitalization: No Has patient had a PCN reaction occurring within the last 10 years: No If all of the above answers are "NO", then may proceed with Cephalosporin use.      REVIEW OF SYSTEMS (Negative unless checked)  Constitutional: [] Weight loss  [] Fever  [] Chills Cardiac: [] Chest pain   [] Chest pressure   [] Palpitations   [] Shortness of breath when laying flat   [] Shortness of breath with exertion. Vascular:  [x] Pain in legs with walking   [] Pain in legs at rest  [] History of DVT   [] Phlebitis   [] Swelling in legs   [] Varicose veins   [] Non-healing ulcers Pulmonary:   [] Uses home oxygen   [] Productive cough   [] Hemoptysis    [] Wheeze  [] COPD   [] Asthma Neurologic:  [] Dizziness   [] Seizures   [] History of stroke   [] History of TIA  [] Aphasia   [] Vissual changes   [] Weakness or numbness in arm   [] Weakness or numbness in leg Musculoskeletal:   [] Joint swelling   [] Joint pain   [] Low back pain Hematologic:  [] Easy bruising  [] Easy bleeding   [] Hypercoagulable state   [] Anemic Gastrointestinal:  [] Diarrhea   [] Vomiting  [] Gastroesophageal reflux/heartburn   []   Difficulty swallowing. Genitourinary:  [] Chronic kidney disease   [] Difficult urination  [] Frequent urination   [] Blood in urine Skin:  [] Rashes   [] Ulcers  Psychological:  [] History of anxiety   []  History of major depression.  Physical Examination  Vitals:   10/29/23 1353  BP: (!) 159/71  Pulse: 71  Resp: 18  Weight: 153 lb 9.6 oz (69.7 kg)  Height: 5\' 1"  (1.549 m)   Body mass index is 29.02 kg/m. Gen: WD/WN, NAD Head: [place]/AT, No temporalis wasting.  Ear/Nose/Throat: Hearing grossly intact, nares w/o erythema or drainage Eyes: PER, EOMI, sclera nonicteric.  Neck: Supple, no masses.  No bruit or JVD.  Pulmonary:  Good air movement, no audible wheezing, no use of accessory muscles.  Cardiac: RRR, normal S1, S2, no Murmurs. Vascular:  mild trophic changes, no open wounds Vessel Right Left  Radial Palpable Palpable  PT Not Palpable Not Palpable  DP Not Palpable Not Palpable  Gastrointestinal: soft, non-distended. No guarding/no peritoneal signs.  Musculoskeletal: M/S 5/5 throughout.  No visible deformity.  Neurologic: CN 2-12 intact. Pain and light touch intact in extremities.  Symmetrical.  Speech is fluent. Motor exam as listed above. Psychiatric: Judgment intact, Mood & affect appropriate for pt's clinical situation. Dermatologic: No rashes or ulcers noted.  No changes consistent with cellulitis.   CBC Lab Results  Component Value Date   WBC 14.1 (H) 08/15/2023   HGB 14.4 08/15/2023   HCT 45.0 08/15/2023   MCV 92.4 08/15/2023   PLT 753 (H)  08/15/2023    BMET    Component Value Date/Time   NA 137 08/15/2023 1315   NA 138 03/29/2015 0918   K 4.0 08/15/2023 1315   K 4.5 03/29/2015 0918   CL 104 08/15/2023 1315   CL 104 03/29/2015 0918   CO2 23 08/15/2023 1315   CO2 26 03/29/2015 0918   GLUCOSE 37 (LL) 08/15/2023 1315   GLUCOSE 187 (H) 03/29/2015 0918   BUN 25 (H) 08/15/2023 1315   BUN 10 03/29/2015 0918   CREATININE 0.99 08/15/2023 1315   CREATININE 0.80 03/29/2015 0918   CALCIUM 9.4 08/15/2023 1315   CALCIUM 9.1 03/29/2015 0918   GFRNONAA 59 (L) 08/15/2023 1315   GFRNONAA >60 03/29/2015 0918   GFRAA >60 09/24/2020 0836   GFRAA >60 03/29/2015 0918   CrCl cannot be calculated (Patient's most recent lab result is older than the maximum 21 days allowed.).  COAG No results found for: "INR", "PROTIME"  Radiology No results found.   Assessment/Plan 1. Atherosclerosis of native artery of both lower extremities with intermittent claudication (HCC)  Recommend:  The patient has evidence of atherosclerosis of the lower extremities with claudication.  The patient does not voice lifestyle limiting changes at this point in time.  Noninvasive studies do not suggest clinically significant change.  No invasive studies, angiography or surgery at this time The patient should continue walking and begin a more formal exercise program.  The patient should continue antiplatelet therapy and aggressive treatment of the lipid abnormalities  No changes in the patient's medications at this time  Continued surveillance is indicated as atherosclerosis is likely to progress with time.    The patient will continue follow up with noninvasive studies as ordered.  - VAS [place] LOWER EXTREMITY ARTERIAL DUPLEX; Future  2. Essential hypertension Continue antihypertensive medications as already ordered, these medications have been reviewed and there are no changes at this time.  3. Type 2 diabetes mellitus with other circulatory  complication, with long-term current  use of insulin (HCC) Continue hypoglycemic medications as already ordered, these medications have been reviewed and there are no changes at this time.  Hgb A1C to be monitored as already arranged by primary service  4. Hyperlipidemia associated with type 2 diabetes mellitus (HCC) Continue statin as ordered and reviewed, no changes at this time  5. Lumbar spondylosis with myelopathy Continue medications to treat the patient's degenerative disease as already ordered, these medications have been reviewed and there are no changes at this time.  Continued activity and therapy was stressed.    Levora Dredge, MD  10/29/2023 2:36 PM

## 2023-11-04 ENCOUNTER — Encounter (INDEPENDENT_AMBULATORY_CARE_PROVIDER_SITE_OTHER): Payer: Self-pay | Admitting: Vascular Surgery

## 2023-11-05 DIAGNOSIS — Z79899 Other long term (current) drug therapy: Principal | ICD-10-CM

## 2023-11-05 DIAGNOSIS — Z940 Kidney transplant status: Principal | ICD-10-CM

## 2023-11-05 MED ORDER — SIROLIMUS 0.5 MG TABLET
ORAL_TABLET | Freq: Every day | ORAL | 3 refills | 90 days | Status: CP
Start: 2023-11-05 — End: 2024-11-04

## 2023-11-05 MED ORDER — PREDNISONE 5 MG TABLET
ORAL_TABLET | Freq: Every day | ORAL | 3 refills | 90 days | Status: CP
Start: 2023-11-05 — End: 2024-11-04

## 2023-11-05 MED ORDER — TACROLIMUS 0.5 MG CAPSULE, IMMEDIATE-RELEASE
ORAL_CAPSULE | ORAL | 3 refills | 90 days | Status: CP
Start: 2023-11-05 — End: 2024-11-04

## 2023-11-17 NOTE — Unmapped (Signed)
Heidi Blankenship has been contacted in regards to their refill of Sirolimus. At this time, they have declined refill due to It appears that Dr.Detweiler has submitted his perscription for sirolimus to [facility] pharmacy.   I have already received  a 90 day supply of  sirolimus   From them. . Refill assessment call date has been updated per the patient's request.

## 2023-11-19 DIAGNOSIS — Z79899 Other long term (current) drug therapy: Principal | ICD-10-CM

## 2023-11-19 DIAGNOSIS — Z940 Kidney transplant status: Principal | ICD-10-CM

## 2023-11-26 DIAGNOSIS — Z79899 Other long term (current) drug therapy: Principal | ICD-10-CM

## 2023-11-26 DIAGNOSIS — Z940 Kidney transplant status: Principal | ICD-10-CM

## 2023-11-28 ENCOUNTER — Ambulatory Visit: Admit: 2023-11-28 | Discharge: 2023-11-28 | Payer: MEDICARE

## 2023-11-28 NOTE — Unmapped (Signed)
ORTHOPAEDIC NOTE     Heidi Blankenship L. Kamree Wiens, PA-C        [name]    MRN: [id number]  DOB: 06-Nov-1946    Date of visit: 11/28/2023    Clinic location: [place]     ASSESSMENT:     Slowly healing left superior and inferior pubic rami fractures with bilateral sacral insufficiency fractures originally sustained in May 2024  Left shoulder arthritis     PLAN:     Regarding her pelvis she will continue weightbearing as tolerated using a cane  I recommended bone stimulator  Again I have encouraged her to speak with her family physician regarding bone supplementation but she states that her family physician has advised her against this  I discussed with patient about her options for her left shoulder to include corticosteroid injection, physical therapy or shoulder replacement and she is interested in total shoulder arthroplasty   -Advised OTC analgesic PRN pain  -Discussed treatment options and patient was amenable to the above plan and was instructed to call and be seen if there is any increasing pain or concerns.     Follow up: 3 months with myself, AP inlet outlet views of the pelvis.  Next available with Dr. Olga Millers or Dr. Neil Crouch to discuss left reverse total shoulder arthroplasty       Chief Complaint:     Recheck pelvis     SUBJECTIVE:     HPI: Heidi Blankenship is a  77 y.o. 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  She has been weightbearing as tolerated using a cane taking calcium and vitamin D.  She states that she continues to have pain in her left buttock with walking but no groin pain.  She does have a significant mount of left shoulder pain.       Allergies  Allergies   Allergen Reactions    Diclofenac Sodium Diarrhea and Nausea And Vomiting    Latex Itching    Penicillins Rash    Percocet [Oxycodone-Acetaminophen] Nausea And Vomiting     Past Medical History  Past Medical History:   Diagnosis Date    Arthritis Oct. 2020    Pain & swelling. 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)     History of nonmelanoma skin cancer 05/31/2015    Kidney transplant recipient     Lymphedema     RUL and RLL    PAD (peripheral artery disease) (CMS-HCC)     Bilateral iliac stents, right femoral and popliteal stents    SDH (subdural hematoma) (CMS-HCC)     After fall 2022, did not require surgery        PHYSICAL EXAM:     MSK: Pelvis and bilateral lower extremities  Inspection: No edema, no erythema, skin intact  Palpation: Tenderness along the left sacrum and gluteal muscles  ROM: Full painless range of motion of the hip  Strength: Adequate symmetric strength bilateral lower extremities  normal sensation RLE LLE  Dorsalis pedal pulses easily palpable  Imaging   Three views of the pelvis AP, Inlet, Outlet, [place] independently reviewed and interpreted by myself show unchanged alignment of left superior and inferior pubic rami fracture with callus formation, there are nondisplaced bilateral sacral fractures with incomplete callus formation on the left with fracture gap of less than 1 mm. No other obvious fractures, lucencies, dislocations, or acute abnormalities.  Left shoulder x-rays obtained on 05/18/2023 independently reviewed interpreted by myself show high riding humeral head with cystic change along the humeral head and glenohumeral degenerative changes    MEDICAL DECISION MAKING (level of service defined by 2/3 elements)     Number/Complexity of Problems Addressed 1 acute, uncomplicated illness or injury (99203/99213)   Amount/Complexity of Data to be Reviewed/Analyzed Independent interpretation of a test performed by another physician/other qualified health care professional (99204/99214)   Risk of Complications/Morbidity/Mortality of Management Over-the-counter Medications (99203/99213)   DME ORDER:  Dx:  ,                   cc:  Jaclyn Shaggy, MD  *Patient note was created using Dragon Dictation sotware. Errors in syntax or grammar may not have been identified and edited on initial review.

## 2023-11-28 NOTE — Unmapped (Signed)
Addended by: Arlester Marker on: 11/28/2023 11:36 AM     Modules accepted: Orders

## 2023-12-04 ENCOUNTER — Ambulatory Visit: Admit: 2023-12-04 | Discharge: 2023-12-04 | Payer: MEDICARE

## 2023-12-04 ENCOUNTER — Other Ambulatory Visit: Admit: 2023-12-04 | Discharge: 2023-12-04 | Payer: MEDICARE

## 2023-12-04 DIAGNOSIS — E0822 Diabetes mellitus due to underlying condition with diabetic chronic kidney disease: Principal | ICD-10-CM

## 2023-12-04 DIAGNOSIS — Z01818 Encounter for other preprocedural examination: Principal | ICD-10-CM

## 2023-12-04 DIAGNOSIS — M12812 Other specific arthropathies, not elsewhere classified, left shoulder: Principal | ICD-10-CM

## 2023-12-04 LAB — COMPREHENSIVE METABOLIC PANEL
ALBUMIN: 3.4 g/dL (ref 3.4–5.0)
ALKALINE PHOSPHATASE: 66 U/L (ref 46–116)
ALT (SGPT): 16 U/L (ref 10–49)
ANION GAP: 5 mmol/L (ref 5–14)
AST (SGOT): 26 U/L (ref <=34)
BILIRUBIN TOTAL: 0.7 mg/dL (ref 0.3–1.2)
BLOOD UREA NITROGEN: 28 mg/dL — ABNORMAL HIGH (ref 9–23)
BUN / CREAT RATIO: 33
CALCIUM: 9.6 mg/dL (ref 8.7–10.4)
CHLORIDE: 102 mmol/L (ref 98–107)
CO2: 28 mmol/L (ref 20.0–31.0)
CREATININE: 0.86 mg/dL (ref 0.55–1.02)
EGFR CKD-EPI (2021) FEMALE: 70 mL/min/{1.73_m2} (ref >=60)
GLUCOSE RANDOM: 44 mg/dL — CL (ref 70–179)
POTASSIUM: 4.6 mmol/L (ref 3.4–4.8)
PROTEIN TOTAL: 6.1 g/dL (ref 5.7–8.2)
SODIUM: 135 mmol/L (ref 135–145)

## 2023-12-04 LAB — CBC W/ AUTO DIFF
BASOPHILS ABSOLUTE COUNT: 0 10*9/L (ref 0.0–0.1)
BASOPHILS RELATIVE PERCENT: 0.4 %
EOSINOPHILS ABSOLUTE COUNT: 0 10*9/L (ref 0.0–0.5)
EOSINOPHILS RELATIVE PERCENT: 0.3 %
HEMATOCRIT: 40.2 % (ref 34.0–44.0)
HEMOGLOBIN: 14 g/dL (ref 11.3–14.9)
LYMPHOCYTES ABSOLUTE COUNT: 1.3 10*9/L (ref 1.1–3.6)
LYMPHOCYTES RELATIVE PERCENT: 11.8 %
MEAN CORPUSCULAR HEMOGLOBIN CONC: 34.8 g/dL (ref 32.0–36.0)
MEAN CORPUSCULAR HEMOGLOBIN: 32.5 pg — ABNORMAL HIGH (ref 25.9–32.4)
MEAN CORPUSCULAR VOLUME: 93.6 fL (ref 77.6–95.7)
MEAN PLATELET VOLUME: 7.6 fL (ref 6.8–10.7)
MONOCYTES ABSOLUTE COUNT: 1.2 10*9/L — ABNORMAL HIGH (ref 0.3–0.8)
MONOCYTES RELATIVE PERCENT: 10.3 %
NEUTROPHILS ABSOLUTE COUNT: 8.6 10*9/L — ABNORMAL HIGH (ref 1.8–7.8)
NEUTROPHILS RELATIVE PERCENT: 77.2 %
PLATELET COUNT: 639 10*9/L — ABNORMAL HIGH (ref 150–450)
RED BLOOD CELL COUNT: 4.29 10*12/L (ref 3.95–5.13)
RED CELL DISTRIBUTION WIDTH: 16.4 % — ABNORMAL HIGH (ref 12.2–15.2)
WBC ADJUSTED: 11.2 10*9/L (ref 3.6–11.2)

## 2023-12-04 LAB — HEMOGLOBIN A1C
ESTIMATED AVERAGE GLUCOSE: 105 mg/dL
HEMOGLOBIN A1C: 5.3 % (ref 4.8–5.6)

## 2023-12-04 NOTE — Unmapped (Addendum)
PRE-SURGICAL INFORMATION & CHECKLIST    THE WEEK BEFORE YOUR SURGERY    You will bleed less during surgery if you stop taking anti-inflammatory medications at least one week before surgery.  These include ASPIRIN, IBUPROFEN (Motrin, Advil), NAPROXYN (Naprosyn), NAPROXEN SODIUM (Aleve), INDOMETHACIN (Indocin), CELEBREX and several other prescription anti-inflammatories commonly prescribed for arthritis or other musculoskeletal conditions. Discontinue FISH OIL supplements as well.  If you are on any blood thinners such as Xarelto, Eliquis, etc those should be stopped 3-5 days before surgery, but contact your prescribing physician for a specific time to stop.  Ask your doctor before discontinuing any/all medication.   If you normally shave the area of surgery, please stop shaving one week (or more) before surgery. Germs grow in small razor nicks from shaving and will increase the risk of infection.     THE LAST BUSINESS DAY BEFORE YOUR SURGERY    Surgery times are given out from 2:00-5:00 p.m. on the last business day before scheduled surgery. If your surgery is scheduled at the [facility] you may call the [facility]  Procedure Center at [phone].  If your surgery is scheduled at the [facility] ( [facility]) , you may call [phone]. For the [facility], the phone number is: [phone].  If you will not be staying at your home address the night before surgery, please call the Ambulatory Procedure Center to let them know where you can be reached.     THE NIGHT BEFORE YOUR SURGERY    DO NOT EAT OR DRINK ANYTHING AFTER MIDNIGHT, unless instructed otherwise.  Take your usual medications as prescribed, unless instructed otherwise.   Shower with antibacterial soap the night before and the morning of surgery.     THE MORNING OF YOUR SURGERY     Take a shower or bath and wash the surgical area with the antibacterial soap you have been given in the clinic.   Remember to bring any crutches, braces or other equipment that have been supplied for use immediately following your surgery.   You may take your usual medications as prescribed with a small sip of water, unless instructed otherwise.  Do not drink a full glass of water.      A RESPONSIBLE ADULT (OVER 18) MUST ACCOMPANY YOU ON THE DAY OF SURGERY AND BE AVAILABLE THROUGHOUT YOUR PROCEDURE IN THE EVENT THERE ARE QUESTIONS OR COMPLICATIONS.  You must have an adult available to take you home following your procedure as it will not be safe for you to drive or take public transportation alone.          The [facility] ? [facility] [phone] ? [place], [place] [address] Phone [phone] ? Fax [phone]         Thank you for coming to [facility] and our clinic today!      We aim to provide you with the highest quality, individualized care.  If you have any unanswered questions after the visit, please do not hesitate to reach out to [place] on MyChart or leave a message for the nurse.  ?  MyChart messages: These messages can be sent to your provider and will be checked by their clinical support staff.? The messages are checked throughout the day during normal business hours from 8:30 am-4:00 pm Monday-Friday, however responses may take up to 48 hours.? Please use this method of communication for non-urgent and non-emergent concerns, questions, refill requests  or inquiries only.? ?Our team will help respond to all of your questions.? Please note that you may be asked to see a provider by either a telehealth or in person visit if it is deemed your questions are best handled in the clinic setting in person.??  ?  Please keep in mind, these messages are not real time communications, so be patient when waiting for a response.     If you do not have access to MyChart, do not know how to use MyChart or have an issue that may require more extensive discussion, please call the nurses' call line: [phone].? This line is checked throughout the day and will be responded to as time allows.? Please note that return calls could take up to 48 hours, depending on the nature of the need.?  ?  If you have an issue that requires emergent attention that cannot wait; either call the Orthopaedics resident on call at [phone], consider coming to our [facility] walk-in clinic, or go to the nearest Emergency Department.     If you need to schedule future appointments, please call [phone].      We look forward to seeing you again in the future and appreciate you choosing [place] for your care!     Thank you    CT Scheduling and follow up instructions:    Your [facility] Orthopaedics Provider has ordered an CT test for you. The Radiology Department should call you directly in 2-3 business days to get this test scheduled. If you do not hear from the Radiology Department by this time, please feel free to call them at [phone], option 1.    Thank you ,    [facility]

## 2023-12-04 NOTE — Unmapped (Signed)
SPORTS MEDICINE NEW VISIT    ASSESSMENT      Heidi Blankenship is a 77 y.o. female  with:  1. Left rotator cuff arthropathy    PLAN:    -- We had a lengthy discussion today in the office with the patient regarding her symptoms and clinical course.  She has advanced rotator cuff arthropathy and surgical intervention at this point would require a reverse total shoulder arthroplasty.  We did discuss both operative and nonoperative treatment options with the patient.  She is interested in proceeding with surgery.  However, we did discuss that she is a very high risk surgical candidate given her numerous medical comorbidities.  We we will work to get her on the surgical schedule and have ordered a CT scan for preoperative evaluation.  We will also plan to have her see [facility].  We also advised that she see her PCP for preoperative risk evaluation.  We will tentatively plan on surgery in January.  All questions were answered and the patient is in agreement with this plan.    --Imaging findings, further diagnostic and therapeutic options were reviewed with the patient, along with the benefits and drawbacks of each, and the patient expressed understanding    Surgical plan: Left reverse total shoulder arthroplasty    Prescriptions today: None    Follow-up: Day of surgery    X-rays at next visit:  None      SUBJECTIVE       History of Present Illness: 77 y.o. female who presents for evaluation of acute on chronic left shoulder pain.  She says this has been present for many years with interval worsening over the last year or so.  She is right-hand dominant.  She reports a prior history of bilateral lower extremity stents and is currently on clopidogrel which is managed by her vascular surgeon per her report.  She has a longstanding history of diabetes, and believes her most recent A1c was approximately 5 that she does not remember the exact value.  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  She denies any numbness or tingling to her left upper extremity.  She is unable to do most of her ADLs with her left arm due to pain but is able to move her right arm to accommodate.      Medical History  Past Medical History:   Diagnosis Date    Arthritis Oct. 2020    Pain & swelling. 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)     History of nonmelanoma skin cancer 05/31/2015    Kidney transplant recipient     Lymphedema     RUL and RLL    PAD (peripheral artery disease) (CMS-HCC)     Bilateral iliac stents, right femoral and popliteal stents    SDH (subdural hematoma) (CMS-HCC)     After fall 2022, did not require surgery    Surgical History  Past Surgical History:   Procedure Laterality Date    PR LIGATN ANGIOACCESS AV FISTULA Right 05/11/2015    Procedure: LIGATION OR BANDING OF ANGIOACCESS ARTERIOVENOUS FISTULA;  Surgeon: Purcell Mouton, MD;  Location: MAIN OR [facility];  Service: Transplant    PR  TRANSPLANT,PREP CADAVER RENAL GRAFT N/A 10/20/2014    Procedure: BACKBNCH STD PREP CAD DONR RENAL ALLOGFT PRIOR TO TRNSPLNT, INCL DISSEC/REM PERINEPH FAT, DIAPH/RTPER ATTAC;  Surgeon: Purcell Mouton, MD;  Location: MAIN OR [place];  Service: Transplant    PR TRANSPLANTATION OF KIDNEY N/A 10/20/2014    Procedure: RENAL ALLOTRANSPLANTATION, IMPLANTATION OF GRAFT; WITHOUT RECIPIENT NEPHRECTOMY;  Surgeon: Purcell Mouton, MD;  Location: MAIN OR [place];  Service: Transplant    SKIN BIOPSY      TONSILLECTOMY  1953    TUBAL LIGATION  1985    Medications  has a current medication list which includes the following prescription(s): acetaminophen, atorvastatin, cholecalciferol (vitamin d3-50 mcg (2,000 unit)), clopidogrel, levothyroxine, lisinopril, lutein, novolin n nph u-100 insulin, novolin r regular u100 insulin, omeprazole, prednisone, sirolimus, tacrolimus, and zinc.   Tobacco/Alcohol History  Social History     Tobacco Use   Smoking Status Former    Current packs/day: 0.00    Average packs/day: 0.5 packs/day for 35.0 years (17.5 ttl pk-yrs)    Types: Cigarettes    Start date: 10/21/1979    Quit date: 10/20/2014    Years since quitting: 9.1   Smokeless Tobacco Never   Tobacco Comments    Off and on use     Social History     Substance and Sexual Activity   Alcohol Use Not Currently    Social History        Occupational History    Not on file       Home Address:  [address]  [place] [address] Family History  Family History   Problem Relation Age of Onset    Multiple myeloma Mother     Diabetes Father     Diabetes Sister     Melanoma Neg Hx     Basal cell carcinoma Neg Hx     Squamous cell carcinoma Neg Hx         Allergies   Diclofenac sodium, Latex, Penicillins, and Percocet [oxycodone-acetaminophen]       Review of Systems  .   Marland Kitchen  No chest pain, dyspnea, nausea, fevers, chills         OBJECTIVE     Physical Exam:    DETAILED PHYSICAL EXAM  General Appearance well-nourished and no acute distress   Vitals Estimated body mass index is 26.97 kg/m?? as calculated from the following:    Height as of 08/23/23: 157.5 cm (5' 2.01).    Weight as of 08/23/23: 66.9 kg (147 lb 7.8 oz).   Mood and Affect alert, cooperative, and pleasant       Cardiovascular well-perfused distally and no swelling         MUSCULOSKELETAL   LEFT:      SHOULDER: Range of motion: forward elevation 80, abduction 40, external rotation 30, unable to internally rotate, all pain limited  Strength:3/5 cuff strength  Apprehension: N/A  Provocative tests/ tenderness: tender to palpation about shoulder           Imaging/other tests: X-rays of the left shoulder from May 2024 were independently reviewed and interpreted today in the office.  Imaging demonstrates advanced rotator cuff arthropathy with anterior subluxation of the humeral head on the glenoid.    Orthopaedic PROMIS:  Failed to redirect to the Timeline version of the REVFS SmartLink.         No data to display                PRO Status:  Incomplete.       ADMINISTRATIVE     I have personally reviewed and interpreted the images (as available).  I have personally reviewed prior records and incorporated relevant information above (as available).    @[email or web address] @    PATIENT PROFILE     Practice: new to provider  Plan: Surgical management     PROCEDURES     Procedures     DME     DME ORDER:  Dx:  ,

## 2023-12-04 NOTE — Unmapped (Signed)
Addended by: Christia Reading on: 12/04/2023 12:44 PM     Modules accepted: Orders

## 2023-12-08 NOTE — Unmapped (Signed)
 I performed a history and physical examination of the patient and   discussed the patient's management with the Resident. I reviewed   the Resident's note and agree with the documented findings and plan   of care.

## 2023-12-15 ENCOUNTER — Encounter: Payer: Self-pay | Admitting: Emergency Medicine

## 2023-12-15 ENCOUNTER — Ambulatory Visit
Admission: EM | Admit: 2023-12-15 | Discharge: 2023-12-15 | Disposition: A | Discharge status: Home / Self Care | Payer: Medicare HMO | Attending: Family Medicine | Admitting: Family Medicine

## 2023-12-15 DIAGNOSIS — B349 Viral infection, unspecified: Secondary | ICD-10-CM

## 2023-12-15 NOTE — ED Provider Notes (Signed)
[facility]    CSN: [id number] Arrival date & time: 12/15/23  0908      History   Chief Complaint Chief Complaint  Patient presents with   Cough    HPI 77 year old female with a history of renal transplant presents with a myriad of symptoms.  Patient states that on Monday she had nausea and vomiting.  Tuesday she was very tired and somnolent.  Wednesday and Thursday she seemed to be back to her normal self mostly.  She has had some ongoing sore throat and feels like she has had some change in her voice.  Occasional cough.  No fever.  Patient states that given her history she thought she better be examined for possible viral etiologies.  Past Medical History:  Diagnosis Date   Cancer (HCC) 2016   skin (nose)   Diabetes mellitus without complication (HCC)    End stage renal disease (HCC)    s/p renal transplant   Hyperlipidemia 08/17/2018   Hypothyroidism    Pneumonia    Renal disorder     Patient Active Problem List   Diagnosis Date Noted   PAC (premature atrial contraction) 05/13/2021   PAD (peripheral artery disease) (HCC) 05/13/2021   Hyperlipidemia associated with type 2 diabetes mellitus (HCC) 05/13/2021   Compression fracture of thoracolumbar vertebra (HCC) 04/16/2021   Nephrolithiasis 04/16/2021   Essential hypertension 01/20/2021   Lumbar spondylosis with myelopathy 06/10/2020   Lymphedema 03/22/2020   GERD (gastroesophageal reflux disease) 02/09/2019   Atherosclerosis of native arteries of extremity with intermittent claudication (HCC) 08/17/2018   S/P kidney transplant 08/17/2018   Diabetes (HCC) 08/17/2018   Erythrocytosis 09/24/2015   Thrombocytosis 09/24/2015   Immunosuppression (HCC) 07/22/2015   History of nonmelanoma skin cancer 05/31/2015   Aftercare following organ transplant 11/26/2014   Hypothyroidism 11/26/2014   Proliferative diabetic retinopathy (HCC) 04/10/2011    Past Surgical History:  Procedure Laterality Date    CATARACT EXTRACTION, BILATERAL     COLONOSCOPY  12/02/2010   COLONOSCOPY WITH PROPOFOL N/A 09/04/2016   Procedure: COLONOSCOPY WITH PROPOFOL;  Surgeon: Kieth Brightly, MD;  Location: [facility] ENDOSCOPY;  Service: Endoscopy;  Laterality: N/A;   EYE SURGERY     INSERTION OF DIALYSIS CATHETER  2014   removed 2014 (only had in for about 8 weeks)   laser surgery on eyes  1999   LOWER EXTREMITY ANGIOGRAPHY Right 10/16/2018   Procedure: LOWER EXTREMITY ANGIOGRAPHY;  Surgeon: Renford Dills, MD;  Location: [facility] INVASIVE CV LAB;  Service: Cardiovascular;  Laterality: Right;   LOWER EXTREMITY ANGIOGRAPHY Right 02/08/2021   Procedure: LOWER EXTREMITY ANGIOGRAPHY;  Surgeon: Renford Dills, MD;  Location: [facility] INVASIVE CV LAB;  Service: Cardiovascular;  Laterality: Right;   LOWER EXTREMITY ANGIOGRAPHY Right 03/02/2021   Procedure: LOWER EXTREMITY ANGIOGRAPHY;  Surgeon: Renford Dills, MD;  Location: [facility] INVASIVE CV LAB;  Service: Cardiovascular;  Laterality: Right;   NEPHRECTOMY TRANSPLANTED ORGAN  10/20/2014   SPINE SURGERY  11/10/2021   TONSILLECTOMY     TUBAL LIGATION      OB History   No obstetric history on file.      Home Medications    Prior to Admission medications   Medication Sig Start Date End Date Taking? Authorizing Provider  acetaminophen (TYLENOL) 650 MG CR tablet Take 1,300 mg by mouth every 8 (eight) hours as needed for pain.    [provider]  aspirin EC 81 MG tablet Take 81 mg by mouth daily.    [provider]  atorvastatin (LIPITOR) 20 MG tablet Take 20 mg by mouth daily.    [provider]  Cholecalciferol (VITAMIN D) 2000 units tablet Take 2,000 Units by mouth daily.    [provider]  clopidogrel (PLAVIX) 75 MG tablet Take 1 tablet (75 mg total) by mouth daily. 06/04/23   Georgiana Spinner, NP  insulin NPH Human (HUMULIN N,NOVOLIN N) 100 UNIT/ML injection Inject 23 Units into the skin daily before breakfast.    [provider]  insulin regular (NOVOLIN R,HUMULIN R) 100 units/mL injection Inject 3-10 Units into the skin 3 (three) times daily as needed for high blood sugar. Sliding scale    [provider]  levothyroxine (SYNTHROID, LEVOTHROID) 100 MCG tablet Take 100 mcg by mouth See admin instructions. Take 100 mcg daily except skip dose on Saturdays    [provider]  lisinopril (PRINIVIL,ZESTRIL) 10 MG tablet Take 10 mg by mouth daily.    [provider]  omeprazole (PRILOSEC) 40 MG capsule Take 40 mg by mouth every other day.  06/26/18   [provider]  predniSONE (DELTASONE) 5 MG tablet Take 5 mg by mouth daily.  05/08/18   [provider]  Sirolimus (RAPAMUNE) 0.5 MG tablet Take by mouth. 01/25/23 01/25/24  [provider]  tacrolimus (PROGRAF) 0.5 MG capsule Take 1-1.5 mg by mouth See admin instructions. Take 1.5 mg in the morning and 1 mg in the evening    [provider]  traMADol (ULTRAM) 50 MG tablet Take 1 tablet (50 mg total) by mouth every 12 (twelve) hours as needed. 08/15/23 08/14/24  Poggi, Herb Grays, PA-C    Family History Family History  Problem Relation Age of Onset   Multiple myeloma Mother    Diabetes Father    Heart attack Father 17   Breast cancer Neg Hx     Social History Social History   Tobacco Use   Smoking status: Former    Current packs/day: 0.00    Average packs/day: 1 pack/day for 35.0 years (35.0 ttl pk-yrs)    Types: Cigarettes    Start date: 35    Quit date: 2015    Years since quitting: 9.9   Smokeless tobacco: Never  Vaping Use   Vaping status: Never Used  Substance Use Topics   Alcohol use: No   Drug use: No     Allergies   Latex, Voltaren [diclofenac sodium], Oxycodone-acetaminophen, and Penicillins   Review of Systems Review of Systems Per HPI  Physical Exam Triage Vital Signs ED Triage Vitals  Encounter Vitals Group     BP 12/15/23 0955 (!) 171/87     Systolic BP Percentile --       Diastolic BP Percentile --      Pulse Rate 12/15/23 0955 76     Resp 12/15/23 0955 14     Temp 12/15/23 0955 98.6 F (37 C)     Temp Source 12/15/23 0955 Oral     SpO2 12/15/23 0955 95 %     Weight 12/15/23 0954 142 lb 12.8 oz (64.8 kg)     Height 12/15/23 0954 5\' 1"  (1.549 m)     Head Circumference --      Peak Flow --      Pain Score 12/15/23 0954 0     Pain Loc --      Pain Education --      Exclude from Growth Chart --    No data found.  Updated Vital Signs  BP (!) 171/87 (BP Location: Left Arm)   Pulse 76   Temp 98.6 F (37 C) (Oral)   Resp 14   Ht 5\' 1"  (1.549 m)   Wt 64.8 kg   SpO2 95%   BMI 26.98 kg/m   Visual Acuity Right Eye Distance:   Left Eye Distance:   Bilateral Distance:    Right Eye Near:   Left Eye Near:    Bilateral Near:     Physical Exam Vitals and nursing note reviewed.  Constitutional:      General: She is not in acute distress.    Appearance: Normal appearance.  HENT:     Head: Normocephalic and atraumatic.     Nose: Nose normal.     Mouth/Throat:     Pharynx: Oropharynx is clear.  Eyes:     General:        Right eye: No discharge.        Left eye: No discharge.     Conjunctiva/sclera: Conjunctivae normal.  Cardiovascular:     Rate and Rhythm: Normal rate and regular rhythm.  Pulmonary:     Effort: Pulmonary effort is normal.     Breath sounds: Normal breath sounds. No wheezing or rales.  Neurological:     Mental Status: She is alert.  Psychiatric:        Mood and Affect: Mood normal.        Behavior: Behavior normal.      UC Treatments / Results  Labs (all labs ordered are listed, but only abnormal results are displayed) Labs Reviewed - No data to display  EKG   Radiology No results found.  Procedures Procedures (including critical care time)  Medications Ordered in UC Medications - No data to display  Initial Impression / Assessment and Plan / UC Course  I have reviewed the triage vital signs and the  nursing notes.  Pertinent labs & imaging results that were available during my care of the patient were reviewed by me and considered in my medical decision making (see chart for details).    77 year old female presents with a suspected viral illness.  She is well-appearing on exam.  She is hypertensive here today.  She needs close follow-up.  Her exam is benign and unrevealing.  She is overall doing well at this time.  Offered labs and testing for strep as well as COVID/flu/RSV.  She declined after discussion with me given her exam.  Advised supportive care.  Follow-up with PCP.  Final Clinical Impressions(s) / UC Diagnoses   Final diagnoses:  Viral illness     Discharge Instructions      Rest.   Follow up with your primary.  Be sure to stay hydrated.   ED Prescriptions   None    PDMP not reviewed this encounter.   Tommie Sams, [place] 12/15/23 1224

## 2023-12-15 NOTE — ED Triage Notes (Signed)
Patient c/o cough and chest congestion that started on Monday.  Patient denies fevers.

## 2023-12-15 NOTE — Discharge Instructions (Signed)
Rest.   Follow up with your primary.  Be sure to stay hydrated.

## 2023-12-17 DIAGNOSIS — Z940 Kidney transplant status: Principal | ICD-10-CM

## 2023-12-17 DIAGNOSIS — Z79899 Other long term (current) drug therapy: Principal | ICD-10-CM

## 2023-12-24 DIAGNOSIS — Z940 Kidney transplant status: Principal | ICD-10-CM

## 2023-12-24 DIAGNOSIS — Z79899 Other long term (current) drug therapy: Principal | ICD-10-CM

## 2024-01-03 NOTE — Unmapped (Signed)
Called pt to schedule the annual imaging appointments. No answer and maul box is full. Sent message through my chart.

## 2024-01-07 ENCOUNTER — Other Ambulatory Visit (INDEPENDENT_AMBULATORY_CARE_PROVIDER_SITE_OTHER): Payer: Self-pay | Admitting: Nurse Practitioner

## 2024-01-08 DIAGNOSIS — Z79899 Other long term (current) drug therapy: Principal | ICD-10-CM

## 2024-01-08 DIAGNOSIS — Z940 Kidney transplant status: Principal | ICD-10-CM

## 2024-01-08 NOTE — Unmapped (Signed)
Updated [facility] orders, TPA reached out to pt to reschedule annual appt and imaging.

## 2024-01-15 NOTE — Unmapped (Signed)
Called pt to schedule annual imaging and neph apt. No answer. VM full unable to leave any messages.

## 2024-01-21 NOTE — Unmapped (Signed)
Specialty Medication(s): sirolimus    Ms.Camey has been dis-enrolled from the [facility] specialty pharmacy services due to a pharmacy change. The patient is now filling at [facility] .    Additional information provided to the patient: pt aware she can call us if she needs anything in the future.    Tera Helper, [facility]  [facility] Specialty Pharmacist

## 2024-02-04 DIAGNOSIS — Z79899 Other long term (current) drug therapy: Principal | ICD-10-CM

## 2024-02-04 DIAGNOSIS — Z940 Kidney transplant status: Principal | ICD-10-CM

## 2024-02-20 LAB — OPHTHALMOLOGY REPORT-SCANNED

## 2024-02-27 ENCOUNTER — Ambulatory Visit: Admit: 2024-02-27 | Discharge: 2024-02-27 | Payer: MEDICARE

## 2024-02-27 ENCOUNTER — Inpatient Hospital Stay: Admit: 2024-02-27 | Discharge: 2024-02-27 | Payer: MEDICARE

## 2024-02-29 ENCOUNTER — Other Ambulatory Visit: Payer: Self-pay | Admitting: Internal Medicine

## 2024-02-29 DIAGNOSIS — Z1231 Encounter for screening mammogram for malignant neoplasm of breast: Secondary | ICD-10-CM

## 2024-03-03 DIAGNOSIS — Z79899 Other long term (current) drug therapy: Principal | ICD-10-CM

## 2024-03-03 DIAGNOSIS — Z940 Kidney transplant status: Principal | ICD-10-CM

## 2024-03-06 DIAGNOSIS — Z940 Kidney transplant status: Principal | ICD-10-CM

## 2024-03-06 DIAGNOSIS — R8279 Other abnormal findings on microbiological examination of urine: Principal | ICD-10-CM

## 2024-03-06 DIAGNOSIS — R799 Abnormal finding of blood chemistry, unspecified: Principal | ICD-10-CM

## 2024-03-07 ENCOUNTER — Inpatient Hospital Stay: Admit: 2024-03-07 | Discharge: 2024-03-08 | Payer: MEDICARE

## 2024-03-07 ENCOUNTER — Ambulatory Visit: Admit: 2024-03-07 | Discharge: 2024-03-08 | Payer: MEDICARE

## 2024-03-07 ENCOUNTER — Ambulatory Visit: Admit: 2024-03-07 | Discharge: 2024-03-08 | Payer: MEDICARE | Attending: Nephrology | Primary: Nephrology

## 2024-03-07 DIAGNOSIS — Z940 Kidney transplant status: Principal | ICD-10-CM

## 2024-03-07 DIAGNOSIS — R8279 Other abnormal findings on microbiological examination of urine: Principal | ICD-10-CM

## 2024-03-07 DIAGNOSIS — R799 Abnormal finding of blood chemistry, unspecified: Principal | ICD-10-CM

## 2024-03-26 ENCOUNTER — Encounter: Payer: Self-pay | Admitting: Podiatry

## 2024-03-26 ENCOUNTER — Ambulatory Visit: Admitting: Podiatry

## 2024-03-26 DIAGNOSIS — L97521 Non-pressure chronic ulcer of other part of left foot limited to breakdown of skin: Secondary | ICD-10-CM | POA: Diagnosis not present

## 2024-03-26 DIAGNOSIS — L03032 Cellulitis of left toe: Secondary | ICD-10-CM

## 2024-03-26 MED ORDER — GENTAMICIN SULFATE 0.1 % EX OINT
1.0000 | TOPICAL_OINTMENT | Freq: Every day | CUTANEOUS | 0 refills | Status: AC
Start: 2024-03-26 — End: ?

## 2024-03-26 MED ORDER — CLINDAMYCIN HCL 300 MG PO CAPS: 300.0000 mg | ORAL_CAPSULE | Freq: Three times a day (TID) | ORAL | 0 refills | Status: DC

## 2024-03-26 NOTE — Progress Notes (Signed)
  Subjective:  Patient ID: Cindy Fischer, female    DOB: 01-21-1946,  MRN: [id number]  Chief Complaint  Patient presents with   Toe Pain    "My left big toe showed up with a little sore and the skin was peeling off of it.  Every once in a while I had a shooting pain.  I want him to look at it and see if it needs treatment." (Diabetic and had Kidney Transplant)    78 y.o. female presents with the above complaint. History confirmed with patient.  Right foot is doing better.  She noticed this worsening recently after she tried to cut the nail.  Objective:  Physical Exam: Nonpalpable pulses foot is fairly warm, there is erythema to the distal medial hallux and incurvated nail    Assessment:   1. Skin ulcer of second toe of left foot, limited to breakdown of skin (HCC)   2. Paronychia, toe, left      Plan:  Patient was evaluated and treated and all questions answered.  Patient have a resolving paronychia I debrided the nail plate further in a slant back fashion to alleviate the offending border.  Did not require total or partial excision of the nail plate.  I recommended local wound care with gentamicin ointment Epsom salts twice a day and I sent her prescription for clindamycin to control any developing infection.  Discussed possibilities of side effects of clindamycin including nausea or diarrhea.  Advised to take a probiotic.  Return in 2 weeks to reevaluate.  Return in about 2 weeks (around 04/09/2024) for nail check.

## 2024-03-27 ENCOUNTER — Ambulatory Visit: Admit: 2024-03-27 | Discharge: 2024-03-28

## 2024-03-27 ENCOUNTER — Other Ambulatory Visit
Admission: RE | Admit: 2024-03-27 | Discharge: 2024-03-27 | Disposition: A | Discharge status: Home / Self Care | Attending: Nephrology | Admitting: Nephrology

## 2024-03-27 DIAGNOSIS — Z09 Encounter for follow-up examination after completed treatment for conditions other than malignant neoplasm: Secondary | ICD-10-CM | POA: Diagnosis not present

## 2024-03-27 DIAGNOSIS — Z79899 Other long term (current) drug therapy: Secondary | ICD-10-CM | POA: Insufficient documentation

## 2024-03-27 DIAGNOSIS — E559 Vitamin D deficiency, unspecified: Secondary | ICD-10-CM | POA: Diagnosis not present

## 2024-03-27 DIAGNOSIS — E1129 Type 2 diabetes mellitus with other diabetic kidney complication: Secondary | ICD-10-CM | POA: Diagnosis not present

## 2024-03-27 DIAGNOSIS — Z940 Kidney transplant status: Secondary | ICD-10-CM | POA: Diagnosis present

## 2024-03-27 DIAGNOSIS — Z114 Encounter for screening for human immunodeficiency virus [HIV]: Secondary | ICD-10-CM | POA: Insufficient documentation

## 2024-03-27 DIAGNOSIS — B259 Cytomegaloviral disease, unspecified: Secondary | ICD-10-CM | POA: Insufficient documentation

## 2024-03-27 DIAGNOSIS — N390 Urinary tract infection, site not specified: Secondary | ICD-10-CM | POA: Insufficient documentation

## 2024-03-27 DIAGNOSIS — D899 Disorder involving the immune mechanism, unspecified: Secondary | ICD-10-CM | POA: Diagnosis not present

## 2024-03-27 DIAGNOSIS — N189 Chronic kidney disease, unspecified: Secondary | ICD-10-CM | POA: Insufficient documentation

## 2024-03-27 DIAGNOSIS — Z9483 Pancreas transplant status: Secondary | ICD-10-CM | POA: Insufficient documentation

## 2024-03-27 DIAGNOSIS — D631 Anemia in chronic kidney disease: Secondary | ICD-10-CM | POA: Diagnosis not present

## 2024-03-27 DIAGNOSIS — Z789 Other specified health status: Secondary | ICD-10-CM | POA: Insufficient documentation

## 2024-03-27 LAB — URINALYSIS, ROUTINE W REFLEX MICROSCOPIC
Bilirubin Urine: NEGATIVE
Glucose, UA: NEGATIVE mg/dL
Hgb urine dipstick: NEGATIVE
Ketones, ur: NEGATIVE mg/dL
Leukocytes,Ua: NEGATIVE
Nitrite: NEGATIVE
Protein, ur: NEGATIVE mg/dL
Specific Gravity, Urine: 1.01 (ref 1.005–1.030)
pH: 5.5 (ref 5.0–8.0)

## 2024-03-27 LAB — BASIC METABOLIC PANEL WITH GFR
Anion gap: 6 (ref 5–15)
BUN: 28 mg/dL — ABNORMAL HIGH (ref 8–23)
CO2: 25 mmol/L (ref 22–32)
Calcium: 9 mg/dL (ref 8.9–10.3)
Chloride: 103 mmol/L (ref 98–111)
Creatinine, Ser: 0.81 mg/dL (ref 0.44–1.00)
GFR, Estimated: >60 mL/min (ref >60)
Glucose, Bld: 64 mg/dL — ABNORMAL LOW (ref 70–99)
Potassium: 4.1 mmol/L (ref 3.5–5.1)
Sodium: 134 mmol/L — ABNORMAL LOW (ref 135–145)

## 2024-03-27 LAB — CBC WITH DIFFERENTIAL/PLATELET
Abs Immature Granulocytes: 0.06 10*3/uL (ref 0.00–0.07)
Basophils Absolute: 0 10*3/uL (ref 0.0–0.1)
Basophils Relative: 0 %
Eosinophils Absolute: 0.1 10*3/uL (ref 0.0–0.5)
Eosinophils Relative: 1 %
HCT: 40.8 % (ref 36.0–46.0)
Hemoglobin: 13.2 g/dL (ref 12.0–15.0)
Immature Granulocytes: 1 %
Lymphocytes Relative: 30 %
Lymphs Abs: 2.5 10*3/uL (ref 0.7–4.0)
MCH: 28.9 pg (ref 26.0–34.0)
MCHC: 32.4 g/dL (ref 30.0–36.0)
MCV: 89.5 fL (ref 80.0–100.0)
Monocytes Absolute: 1 10*3/uL (ref 0.1–1.0)
Monocytes Relative: 12 %
Neutro Abs: 4.7 10*3/uL (ref 1.7–7.7)
Neutrophils Relative %: 56 %
Platelets: 537 10*3/uL — ABNORMAL HIGH (ref 150–400)
RBC: 4.56 MIL/uL (ref 3.87–5.11)
RDW: 17.5 % — ABNORMAL HIGH (ref 11.5–15.5)
WBC: 8.4 10*3/uL (ref 4.0–10.5)
nRBC: 0 % (ref 0.0–0.2)

## 2024-03-27 LAB — PROTEIN / CREATININE RATIO, URINE
Creatinine, Urine: 90 mg/dL
Total Protein, Urine: <6 mg/dL

## 2024-03-27 LAB — PHOSPHORUS: Phosphorus: 2.8 mg/dL (ref 2.5–4.6)

## 2024-03-27 LAB — MAGNESIUM: Magnesium: 2 mg/dL (ref 1.7–2.4)

## 2024-03-28 LAB — MICROALBUMIN / CREATININE URINE RATIO
Creatinine, Urine: 78.7 mg/dL
Microalb Creat Ratio: 5 mg/g{creat} (ref 0–29)
Microalb, Ur: 3.9 ug/mL — ABNORMAL HIGH

## 2024-03-28 LAB — URINE CULTURE: Culture: <10000 — AB

## 2024-03-31 ENCOUNTER — Ambulatory Visit
Admission: RE | Admit: 2024-03-31 | Discharge: 2024-03-31 | Disposition: A | Discharge status: Home / Self Care | Source: Ambulatory Visit | Attending: Internal Medicine | Admitting: Internal Medicine

## 2024-03-31 DIAGNOSIS — Z1231 Encounter for screening mammogram for malignant neoplasm of breast: Secondary | ICD-10-CM | POA: Diagnosis present

## 2024-04-09 ENCOUNTER — Ambulatory Visit: Admitting: Podiatry

## 2024-04-09 VITALS — Ht 61.0 in | Wt 142.8 lb

## 2024-04-09 DIAGNOSIS — L97522 Non-pressure chronic ulcer of other part of left foot with fat layer exposed: Secondary | ICD-10-CM | POA: Diagnosis not present

## 2024-04-13 NOTE — Progress Notes (Signed)
  Subjective:  Patient ID: Cindy Fischer, female    DOB: Jun 10, 1946,  MRN: [id number]  Chief Complaint  Patient presents with   Nail Problem    Pt is here to f/u on right great toe states she has been keeping it clean and putting ointment on as she was told.    78 y.o. female presents with the above complaint. History confirmed with patient.   Still has some redness around and occasional drainage  Objective:  Physical Exam: Nonpalpable pulses foot is fairly warm, there is erythema to the distal medial hallux surrounding a small 3 mm x 3 mm x 2 mm ulcer with fat layer exposed and fibrous wound bed    Assessment:   1. Skin ulcer of left great toe with fat layer exposed (HCC)      Plan:  Patient was evaluated and treated and all questions answered.  No further worsening signs of infection.  She does have a full-thickness ulceration.  I recommended sharp debridement.  Following application of lidocaine  prilocaine cream as a topical anesthetic, the ulcer on the left great toe was debrided in an excisional manner sharply with a ring curette the subcutaneous layer.  Hemostasis easily achieved.  Continue gentamicin  ointment at home.  Iodosorb and bandage applied today.  Follow-up in 2-3 weeks to reevaluate No follow-ups on file.

## 2024-04-24 ENCOUNTER — Other Ambulatory Visit
Admission: RE | Admit: 2024-04-24 | Discharge: 2024-04-24 | Disposition: A | Discharge status: Home / Self Care | Attending: Nephrology | Admitting: Nephrology

## 2024-04-24 DIAGNOSIS — D631 Anemia in chronic kidney disease: Secondary | ICD-10-CM | POA: Insufficient documentation

## 2024-04-24 DIAGNOSIS — Z9483 Pancreas transplant status: Secondary | ICD-10-CM | POA: Diagnosis not present

## 2024-04-24 DIAGNOSIS — E1129 Type 2 diabetes mellitus with other diabetic kidney complication: Secondary | ICD-10-CM | POA: Insufficient documentation

## 2024-04-24 DIAGNOSIS — Z09 Encounter for follow-up examination after completed treatment for conditions other than malignant neoplasm: Secondary | ICD-10-CM | POA: Insufficient documentation

## 2024-04-24 DIAGNOSIS — Z114 Encounter for screening for human immunodeficiency virus [HIV]: Secondary | ICD-10-CM | POA: Insufficient documentation

## 2024-04-24 DIAGNOSIS — N390 Urinary tract infection, site not specified: Secondary | ICD-10-CM | POA: Diagnosis not present

## 2024-04-24 DIAGNOSIS — Z789 Other specified health status: Secondary | ICD-10-CM | POA: Insufficient documentation

## 2024-04-24 DIAGNOSIS — D899 Disorder involving the immune mechanism, unspecified: Secondary | ICD-10-CM | POA: Insufficient documentation

## 2024-04-24 DIAGNOSIS — N189 Chronic kidney disease, unspecified: Secondary | ICD-10-CM | POA: Diagnosis not present

## 2024-04-24 DIAGNOSIS — Z940 Kidney transplant status: Secondary | ICD-10-CM | POA: Insufficient documentation

## 2024-04-24 DIAGNOSIS — Z79899 Other long term (current) drug therapy: Secondary | ICD-10-CM | POA: Diagnosis not present

## 2024-04-24 DIAGNOSIS — B259 Cytomegaloviral disease, unspecified: Secondary | ICD-10-CM | POA: Insufficient documentation

## 2024-04-24 DIAGNOSIS — E559 Vitamin D deficiency, unspecified: Secondary | ICD-10-CM | POA: Diagnosis not present

## 2024-04-24 LAB — BASIC METABOLIC PANEL WITH GFR
Anion gap: 5 (ref 5–15)
BUN: 24 mg/dL — ABNORMAL HIGH (ref 8–23)
CO2: 25 mmol/L (ref 22–32)
Calcium: 9 mg/dL (ref 8.9–10.3)
Chloride: 102 mmol/L (ref 98–111)
Creatinine, Ser: 0.86 mg/dL (ref 0.44–1.00)
GFR, Estimated: >60 mL/min (ref >60)
Glucose, Bld: 58 mg/dL — ABNORMAL LOW (ref 70–99)
Potassium: 4.1 mmol/L (ref 3.5–5.1)
Sodium: 132 mmol/L — ABNORMAL LOW (ref 135–145)

## 2024-04-24 LAB — CBC WITH DIFFERENTIAL/PLATELET
Abs Immature Granulocytes: 0.18 10*3/uL — ABNORMAL HIGH (ref 0.00–0.07)
Basophils Absolute: 0.1 10*3/uL (ref 0.0–0.1)
Basophils Relative: 0 %
Eosinophils Absolute: 0.2 10*3/uL (ref 0.0–0.5)
Eosinophils Relative: 2 %
HCT: 39.9 % (ref 36.0–46.0)
Hemoglobin: 13.4 g/dL (ref 12.0–15.0)
Immature Granulocytes: 1 %
Lymphocytes Relative: 17 %
Lymphs Abs: 2.2 10*3/uL (ref 0.7–4.0)
MCH: 29.5 pg (ref 26.0–34.0)
MCHC: 33.6 g/dL (ref 30.0–36.0)
MCV: 87.9 fL (ref 80.0–100.0)
Monocytes Absolute: 1.2 10*3/uL — ABNORMAL HIGH (ref 0.1–1.0)
Monocytes Relative: 10 %
Neutro Abs: 9 10*3/uL — ABNORMAL HIGH (ref 1.7–7.7)
Neutrophils Relative %: 70 %
Platelets: 696 10*3/uL — ABNORMAL HIGH (ref 150–400)
RBC: 4.54 MIL/uL (ref 3.87–5.11)
RDW: 17 % — ABNORMAL HIGH (ref 11.5–15.5)
WBC: 12.8 10*3/uL — ABNORMAL HIGH (ref 4.0–10.5)
nRBC: 0 % (ref 0.0–0.2)

## 2024-04-24 LAB — MAGNESIUM: Magnesium: 2.1 mg/dL (ref 1.7–2.4)

## 2024-04-24 LAB — PHOSPHORUS: Phosphorus: 3 mg/dL (ref 2.5–4.6)

## 2024-04-25 ENCOUNTER — Other Ambulatory Visit
Admission: RE | Admit: 2024-04-25 | Discharge: 2024-04-25 | Disposition: A | Discharge status: Home / Self Care | Attending: Nephrology | Admitting: Nephrology

## 2024-04-25 DIAGNOSIS — Z9483 Pancreas transplant status: Secondary | ICD-10-CM | POA: Insufficient documentation

## 2024-04-25 DIAGNOSIS — N189 Chronic kidney disease, unspecified: Secondary | ICD-10-CM | POA: Diagnosis not present

## 2024-04-25 DIAGNOSIS — Z09 Encounter for follow-up examination after completed treatment for conditions other than malignant neoplasm: Secondary | ICD-10-CM | POA: Diagnosis present

## 2024-04-25 DIAGNOSIS — Z79899 Other long term (current) drug therapy: Secondary | ICD-10-CM | POA: Insufficient documentation

## 2024-04-25 DIAGNOSIS — Z940 Kidney transplant status: Secondary | ICD-10-CM | POA: Insufficient documentation

## 2024-04-25 DIAGNOSIS — Z789 Other specified health status: Secondary | ICD-10-CM | POA: Insufficient documentation

## 2024-04-25 DIAGNOSIS — E559 Vitamin D deficiency, unspecified: Secondary | ICD-10-CM | POA: Diagnosis not present

## 2024-04-25 DIAGNOSIS — D631 Anemia in chronic kidney disease: Secondary | ICD-10-CM | POA: Diagnosis not present

## 2024-04-25 DIAGNOSIS — E1129 Type 2 diabetes mellitus with other diabetic kidney complication: Secondary | ICD-10-CM | POA: Insufficient documentation

## 2024-04-25 DIAGNOSIS — Z114 Encounter for screening for human immunodeficiency virus [HIV]: Secondary | ICD-10-CM | POA: Diagnosis not present

## 2024-04-25 DIAGNOSIS — D899 Disorder involving the immune mechanism, unspecified: Secondary | ICD-10-CM | POA: Insufficient documentation

## 2024-04-25 DIAGNOSIS — B259 Cytomegaloviral disease, unspecified: Secondary | ICD-10-CM | POA: Insufficient documentation

## 2024-04-25 DIAGNOSIS — N390 Urinary tract infection, site not specified: Secondary | ICD-10-CM | POA: Diagnosis not present

## 2024-04-25 LAB — URINALYSIS, ROUTINE W REFLEX MICROSCOPIC
Bilirubin Urine: NEGATIVE
Glucose, UA: NEGATIVE mg/dL
Hgb urine dipstick: NEGATIVE
Leukocytes,Ua: NEGATIVE
Nitrite: NEGATIVE
Protein, ur: NEGATIVE mg/dL
Specific Gravity, Urine: 1.015 (ref 1.005–1.030)
pH: 5.5 (ref 5.0–8.0)

## 2024-04-25 LAB — PROTEIN / CREATININE RATIO, URINE
Creatinine, Urine: 158 mg/dL
Protein Creatinine Ratio: 0.05 mg/mg{creat} (ref 0.00–0.15)
Total Protein, Urine: 8 mg/dL

## 2024-04-25 LAB — URINALYSIS, MICROSCOPIC (REFLEX): RBC / HPF: NONE SEEN RBC/hpf (ref 0–5)

## 2024-04-26 LAB — URINE CULTURE

## 2024-04-27 NOTE — Progress Notes (Signed)
 MRN : [id number]  Cindy Fischer is a 78 y.o. (20-Mar-1946) female who presents with chief complaint of check circulation.  History of Present Illness:   The patient returns to the office for followup and review of the noninvasive studies. There have been no interval changes in lower extremity symptoms. No interval shortening of the patient's claudication distance or development of rest pain symptoms. No new ulcers or wounds have occurred since the last visit.   She is also noting increased edema particularly with dependency.  She notes that it is minimal in the morning but worsens steadily throughout the day.  She has been using compression socks but these are not controlling her swelling very well.    The patient denies amaurosis fugax or recent TIA symptoms. There are no recent neurological changes noted. The patient denies history of DVT, PE or superficial thrombophlebitis. The patient denies recent episodes of angina or shortness of breath.    ABI Rt=[place] (TBI=0.32) and Lt=[place] (TBI=0.23)  (previous ABI Rt=[place] (TBI=0.32) and Lt=[place] (TBI=0.23))   Duplex ultrasound of the bilateral lower extremity arterial system demonstrates of the SFA/popliteal bilaterally.  No outpatient medications have been marked as taking for the 04/28/24 encounter (Appointment) with Prescilla Brod, Ninette Basque, MD.    Past Medical History:  Diagnosis Date   Cancer [facility]) 2016   skin (nose)   Diabetes mellitus without complication (HCC)    End stage renal disease (HCC)    s/p renal transplant   Hyperlipidemia 08/17/2018   Hypothyroidism    Pneumonia    Renal disorder     Past Surgical History:  Procedure Laterality Date   CATARACT EXTRACTION, BILATERAL     COLONOSCOPY  12/02/2010   COLONOSCOPY WITH PROPOFOL  N/A 09/04/2016   Procedure: COLONOSCOPY WITH PROPOFOL ;  Surgeon: Jerlean Mood, MD;  Location: [facility] ENDOSCOPY;  Service: Endoscopy;   Laterality: N/A;   EYE SURGERY     INSERTION OF DIALYSIS CATHETER  2014   removed 2014 (only had in for about 8 weeks)   laser surgery on eyes  1999   LOWER EXTREMITY ANGIOGRAPHY Right 10/16/2018   Procedure: LOWER EXTREMITY ANGIOGRAPHY;  Surgeon: Jackquelyn Mass, MD;  Location: [facility] INVASIVE CV LAB;  Service: Cardiovascular;  Laterality: Right;   LOWER EXTREMITY ANGIOGRAPHY Right 02/08/2021   Procedure: LOWER EXTREMITY ANGIOGRAPHY;  Surgeon: Jackquelyn Mass, MD;  Location: [facility] INVASIVE CV LAB;  Service: Cardiovascular;  Laterality: Right;   LOWER EXTREMITY ANGIOGRAPHY Right 03/02/2021   Procedure: LOWER EXTREMITY ANGIOGRAPHY;  Surgeon: Jackquelyn Mass, MD;  Location: [facility] INVASIVE CV LAB;  Service: Cardiovascular;  Laterality: Right;   NEPHRECTOMY TRANSPLANTED ORGAN  10/20/2014   SPINE SURGERY  11/10/2021   TONSILLECTOMY     TUBAL LIGATION      Social History Social History   Tobacco Use   Smoking status: Former    Current packs/day: 0.00    Average packs/day: 1 pack/day for 35.0 years (35.0 ttl pk-yrs)    Types: Cigarettes    Start date: 39    Quit date: 2015    Years since quitting: 10.3  Smokeless tobacco: Never  Vaping Use   Vaping status: Never Used  Substance Use Topics   Alcohol use: No   Drug use: No    Family History Family History  Problem Relation Age of Onset   Multiple myeloma Mother    Diabetes Father    Heart attack Father 65   Breast cancer Neg Hx     Allergies  Allergen Reactions   Latex Itching    Patient states that she is NOT allergic to latex - 05/26/2023   Voltaren [Diclofenac Sodium] Diarrhea and Nausea And Vomiting    Patient states that she is NOT allergic to Voltaren - 05/26/2023   Oxycodone -Acetaminophen  Nausea And Vomiting    Patient states that she is NOT allergic to Oxycodone -acetaminopehn - 05/26/2023   Penicillins Rash    Has patient had a PCN reaction causing immediate rash, facial/tongue/throat swelling, SOB or  lightheadedness with hypotension: Yes Has patient had a PCN reaction causing severe rash involving mucus membranes or skin necrosis: No Has patient had a PCN reaction that required hospitalization: No Has patient had a PCN reaction occurring within the last 10 years: No If all of the above answers are "NO", then may proceed with Cephalosporin use.      REVIEW OF SYSTEMS (Negative unless checked)  Constitutional: [] Weight loss  [] Fever  [] Chills Cardiac: [] Chest pain   [] Chest pressure   [] Palpitations   [] Shortness of breath when laying flat   [] Shortness of breath with exertion. Vascular:  [x] Pain in legs with walking   [] Pain in legs at rest  [] History of DVT   [] Phlebitis   [] Swelling in legs   [] Varicose veins   [] Non-healing ulcers Pulmonary:   [] Uses home oxygen   [] Productive cough   [] Hemoptysis   [] Wheeze  [] COPD   [] Asthma Neurologic:  [] Dizziness   [] Seizures   [] History of stroke   [] History of TIA  [] Aphasia   [] Vissual changes   [] Weakness or numbness in arm   [] Weakness or numbness in leg Musculoskeletal:   [] Joint swelling   [] Joint pain   [] Low back pain Hematologic:  [] Easy bruising  [] Easy bleeding   [] Hypercoagulable state   [] Anemic Gastrointestinal:  [] Diarrhea   [] Vomiting  [x] Gastroesophageal reflux/heartburn   [] Difficulty swallowing. Genitourinary:  [] Chronic kidney disease   [] Difficult urination  [] Frequent urination   [] Blood in urine Skin:  [] Rashes   [] Ulcers  Psychological:  [] History of anxiety   []  History of major depression.  Physical Examination  There were no vitals filed for this visit. There is no height or weight on file to calculate BMI. Gen: WD/WN, NAD Head: [place]/AT, No temporalis wasting.  Ear/Nose/Throat: Hearing grossly intact, nares w/o erythema or drainage Eyes: PER, EOMI, sclera nonicteric.  Neck: Supple, no masses.  No bruit or JVD.  Pulmonary:  Good air movement, no audible wheezing, no use of accessory muscles.  Cardiac: RRR, normal  S1, S2, no Murmurs. Vascular:  mild trophic changes, no open wounds Vessel Right Left  Radial Palpable Palpable  PT Not Palpable Not Palpable  DP Not Palpable Not Palpable  Gastrointestinal: soft, non-distended. No guarding/no peritoneal signs.  Musculoskeletal: M/S 5/5 throughout.  No visible deformity.  Neurologic: CN 2-12 intact. Pain and light touch intact in extremities.  Symmetrical.  Speech is fluent. Motor exam as listed above. Psychiatric: Judgment intact, Mood & affect appropriate for pt's clinical situation. Dermatologic: No rashes or ulcers noted.  No changes consistent with cellulitis.   CBC Lab Results  Component Value Date  WBC 12.8 (H) 04/24/2024   HGB 13.4 04/24/2024   HCT 39.9 04/24/2024   MCV 87.9 04/24/2024   PLT 696 (H) 04/24/2024    BMET    Component Value Date/Time   NA 132 (L) 04/24/2024 0815   NA 138 03/29/2015 0918   K 4.1 04/24/2024 0815   K 4.5 03/29/2015 0918   CL 102 04/24/2024 0815   CL 104 03/29/2015 0918   CO2 25 04/24/2024 0815   CO2 26 03/29/2015 0918   GLUCOSE 58 (L) 04/24/2024 0815   GLUCOSE 187 (H) 03/29/2015 0918   BUN 24 (H) 04/24/2024 0815   BUN 10 03/29/2015 0918   CREATININE 0.86 04/24/2024 0815   CREATININE 0.80 03/29/2015 0918   CALCIUM  9.0 04/24/2024 0815   CALCIUM  9.1 03/29/2015 0918   GFRNONAA >60 04/24/2024 0815   GFRNONAA >60 03/29/2015 0918   GFRAA >60 09/24/2020 0836   GFRAA >60 03/29/2015 0918   CrCl cannot be calculated (Unknown ideal weight.).  COAG No results found for: "INR", "PROTIME"  Radiology MM 3D SCREENING MAMMOGRAM BILATERAL BREAST Result Date: 04/02/2024 CLINICAL DATA:  Screening. EXAM: DIGITAL SCREENING BILATERAL MAMMOGRAM WITH TOMOSYNTHESIS AND CAD TECHNIQUE: Bilateral screening digital craniocaudal and mediolateral oblique mammograms were obtained. Bilateral screening digital breast tomosynthesis was performed. The images were evaluated with computer-aided detection. COMPARISON:  Previous  exam(s). ACR Breast Density Category b: There are scattered areas of fibroglandular density. FINDINGS: There are no findings suspicious for malignancy. IMPRESSION: No mammographic evidence of malignancy. A result letter of this screening mammogram will be mailed directly to the patient. RECOMMENDATION: Screening mammogram in one year. (Code:SM-B-01Y) BI-RADS CATEGORY  1: Negative. Electronically Signed   By: Alger Infield M.D.   On: 04/02/2024 10:46     Assessment/Plan: 1. Atherosclerosis of native artery of both lower extremities with intermittent claudication (HCC) (Primary)  Recommend:  The patient has evidence of atherosclerosis of the lower extremities with claudication.  The patient does not voice lifestyle limiting changes at this point in time.  Although she has occluded both of her previous interventions on the right and the left she seems to be stable and has no new complaints.  We discussed this and at this time will not pursue further intervention.  Noninvasive studies do not suggest clinically significant change.  No invasive studies, angiography or surgery at this time The patient should continue walking and begin a more formal exercise program.  The patient should continue antiplatelet therapy and aggressive treatment of the lipid abnormalities  No changes in the patient's medications at this time  Continued surveillance is indicated as atherosclerosis is likely to progress with time.    The patient will continue follow up with noninvasive studies as ordered.  - VAS US  ABI WITH/WO TBI; Future  2. Lymphedema Recommend:  No surgery or intervention at this point in time.   The Patient is CEAP [id number].  The patient has been wearing compression for more than 12 weeks with no or little benefit.  The patient has been exercising daily for more than 12 weeks. The patient has been elevating and taking OTC pain medications for more than 12 weeks.  None of these have have  eliminated the pain related to the lymphedema or the discomfort regarding excessive swelling and venous congestion.    I have reviewed my discussion with the patient regarding lymphedema and why it  causes symptoms.  Patient will continue wearing graduated compression on a daily basis. The patient should put the compression on first thing  in the morning and removing them in the evening. The patient should not sleep in the compression.   In addition, behavioral modification throughout the day will be continued.  This will include frequent elevation (such as in a recliner), use of over the counter pain medications as needed and exercise such as walking.  The systemic causes for chronic edema such as liver, kidney and cardiac etiologies do not appear to have significant changed over the past year.    The patient has chronic , severe lymphedema with hyperpigmentation of the skin and has done MLD, skin care, medication, diet, exercise, elevation and compression for 4 weeks with no improvement,  I am recommending a lymphedema pump.  The patient still has stage 3 lymphedema and therefore, I believe that a lymph pump is needed to improve the control of the patient's lymphedema and improve the quality of life.  Additionally, a lymph pump is warranted because it will reduce the risk of cellulitis and ulceration in the future.  Patient should follow-up in six months   3. Essential hypertension Continue antihypertensive medications as already ordered, these medications have been reviewed and there are no changes at this time.  4. Type 2 diabetes mellitus with other circulatory complication, with long-term current use of insulin  (HCC) Continue hypoglycemic medications as already ordered, these medications have been reviewed and there are no changes at this time.  Hgb A1C to be monitored as already arranged by primary service  5. Hyperlipidemia associated with type 2 diabetes mellitus (HCC) Continue statin as  ordered and reviewed, no changes at this time  6. Gastroesophageal reflux disease without esophagitis Continue PPI as already ordered, this medication has been reviewed and there are no changes at this time.  Avoidence of caffeine and alcohol  Moderate elevation of the head of the bed    Devon Fogo, MD  04/27/2024 3:28 PM

## 2024-04-28 ENCOUNTER — Ambulatory Visit (INDEPENDENT_AMBULATORY_CARE_PROVIDER_SITE_OTHER): Payer: Medicare HMO | Admitting: Vascular Surgery

## 2024-04-28 ENCOUNTER — Ambulatory Visit (INDEPENDENT_AMBULATORY_CARE_PROVIDER_SITE_OTHER): Payer: Medicare HMO

## 2024-04-28 ENCOUNTER — Encounter (INDEPENDENT_AMBULATORY_CARE_PROVIDER_SITE_OTHER): Payer: Self-pay | Admitting: Vascular Surgery

## 2024-04-28 VITALS — BP 157/67 | HR 63 | Resp 16 | Wt 146.4 lb

## 2024-04-28 DIAGNOSIS — I890 Lymphedema, not elsewhere classified: Secondary | ICD-10-CM | POA: Diagnosis not present

## 2024-04-28 DIAGNOSIS — I10 Essential (primary) hypertension: Secondary | ICD-10-CM | POA: Diagnosis not present

## 2024-04-28 DIAGNOSIS — K219 Gastro-esophageal reflux disease without esophagitis: Secondary | ICD-10-CM

## 2024-04-28 DIAGNOSIS — I70213 Atherosclerosis of native arteries of extremities with intermittent claudication, bilateral legs: Secondary | ICD-10-CM | POA: Diagnosis not present

## 2024-04-28 DIAGNOSIS — Z794 Long term (current) use of insulin: Secondary | ICD-10-CM

## 2024-04-28 DIAGNOSIS — E1169 Type 2 diabetes mellitus with other specified complication: Secondary | ICD-10-CM

## 2024-04-28 DIAGNOSIS — E1159 Type 2 diabetes mellitus with other circulatory complications: Secondary | ICD-10-CM

## 2024-04-28 DIAGNOSIS — E785 Hyperlipidemia, unspecified: Secondary | ICD-10-CM

## 2024-05-04 ENCOUNTER — Encounter (INDEPENDENT_AMBULATORY_CARE_PROVIDER_SITE_OTHER): Payer: Self-pay | Admitting: Vascular Surgery

## 2024-05-07 ENCOUNTER — Encounter: Payer: Self-pay | Admitting: Podiatry

## 2024-05-07 ENCOUNTER — Ambulatory Visit (INDEPENDENT_AMBULATORY_CARE_PROVIDER_SITE_OTHER): Admitting: Podiatry

## 2024-05-07 VITALS — Ht 61.0 in | Wt 146.0 lb

## 2024-05-07 DIAGNOSIS — E1169 Type 2 diabetes mellitus with other specified complication: Principal | ICD-10-CM

## 2024-05-07 DIAGNOSIS — E669 Obesity, unspecified: Principal | ICD-10-CM

## 2024-05-07 DIAGNOSIS — L97521 Non-pressure chronic ulcer of other part of left foot limited to breakdown of skin: Secondary | ICD-10-CM

## 2024-05-07 MED ORDER — LISINOPRIL 10 MG TABLET
ORAL_TABLET | Freq: Every day | ORAL | 3 refills | 90.00000 days | Status: CP
Start: 2024-05-07 — End: ?

## 2024-05-08 ENCOUNTER — Encounter: Payer: Self-pay | Admitting: Podiatry

## 2024-05-08 NOTE — Progress Notes (Signed)
  Subjective:  Patient ID: Cindy Fischer, female    DOB: 1946-05-01,  MRN: [id number]  Chief Complaint  Patient presents with   Toe Pain    Patient is here for 4WK F/U for left hallux toe still red    78 y.o. female presents with the above complaint. History confirmed with patient.   She notes doing better not having drainage  Objective:  Physical Exam: Nonpalpable pulses foot is fairly warm, there is erythema to the distal hallux but this is consistent across all her toes, no active drainage or ulceration currently    Assessment:   1. Skin ulcer of left great toe, limited to breakdown of skin [facility])      Plan:  Patient was evaluated and treated and all questions answered.  Ulceration has healed.  Most of the erythema is likely dependent rubor.  No further active ulceration required wound care she should monitor closely for recurrence and see me as needed for this.  No follow-ups on file.

## 2024-05-10 ENCOUNTER — Emergency Department
Admission: EM | Admit: 2024-05-10 | Discharge: 2024-05-10 | Disposition: A | Discharge status: Home / Self Care | Attending: Emergency Medicine | Admitting: Emergency Medicine

## 2024-05-10 ENCOUNTER — Other Ambulatory Visit: Payer: Self-pay

## 2024-05-10 DIAGNOSIS — Z940 Kidney transplant status: Secondary | ICD-10-CM | POA: Insufficient documentation

## 2024-05-10 DIAGNOSIS — E11649 Type 2 diabetes mellitus with hypoglycemia without coma: Secondary | ICD-10-CM | POA: Insufficient documentation

## 2024-05-10 DIAGNOSIS — Z794 Long term (current) use of insulin: Secondary | ICD-10-CM | POA: Insufficient documentation

## 2024-05-10 DIAGNOSIS — E162 Hypoglycemia, unspecified: Secondary | ICD-10-CM

## 2024-05-10 LAB — CBC
HCT: 43.6 % (ref 36.0–46.0)
Hemoglobin: 14.4 g/dL (ref 12.0–15.0)
MCH: 29.4 pg (ref 26.0–34.0)
MCHC: 33 g/dL (ref 30.0–36.0)
MCV: 89.2 fL (ref 80.0–100.0)
Platelets: 628 10*3/uL — ABNORMAL HIGH (ref 150–400)
RBC: 4.89 MIL/uL (ref 3.87–5.11)
RDW: 16.1 % — ABNORMAL HIGH (ref 11.5–15.5)
WBC: 16.6 10*3/uL — ABNORMAL HIGH (ref 4.0–10.5)
nRBC: 0 % (ref 0.0–0.2)

## 2024-05-10 LAB — URINALYSIS, ROUTINE W REFLEX MICROSCOPIC
Bilirubin Urine: NEGATIVE
Glucose, UA: NEGATIVE mg/dL
Hgb urine dipstick: NEGATIVE
Ketones, ur: NEGATIVE mg/dL
Leukocytes,Ua: NEGATIVE
Nitrite: NEGATIVE
Protein, ur: 30 mg/dL — AB
Specific Gravity, Urine: 1.027 (ref 1.005–1.030)
pH: 5 (ref 5.0–8.0)

## 2024-05-10 LAB — CBG MONITORING, ED
Glucose-Capillary: 130 mg/dL — ABNORMAL HIGH (ref 70–99)
Glucose-Capillary: 175 mg/dL — ABNORMAL HIGH (ref 70–99)

## 2024-05-10 LAB — COMPREHENSIVE METABOLIC PANEL WITH GFR
ALT: 22 U/L (ref 0–44)
AST: 37 U/L (ref 15–41)
Albumin: 3.5 g/dL (ref 3.5–5.0)
Alkaline Phosphatase: 49 U/L (ref 38–126)
Anion gap: 9 (ref 5–15)
BUN: 32 mg/dL — ABNORMAL HIGH (ref 8–23)
CO2: 24 mmol/L (ref 22–32)
Calcium: 9.2 mg/dL (ref 8.9–10.3)
Chloride: 103 mmol/L (ref 98–111)
Creatinine, Ser: 0.81 mg/dL (ref 0.44–1.00)
GFR, Estimated: >60 mL/min (ref >60)
Glucose, Bld: 132 mg/dL — ABNORMAL HIGH (ref 70–99)
Potassium: 4.1 mmol/L (ref 3.5–5.1)
Sodium: 136 mmol/L (ref 135–145)
Total Bilirubin: 0.7 mg/dL (ref 0.0–1.2)
Total Protein: 6.4 g/dL — ABNORMAL LOW (ref 6.5–8.1)

## 2024-05-10 LAB — TROPONIN I (HIGH SENSITIVITY): Troponin I (High Sensitivity): 5 ng/L (ref <18)

## 2024-05-10 LAB — CK: Total CK: 36 U/L — ABNORMAL LOW (ref 38–234)

## 2024-05-10 MED ORDER — ACETAMINOPHEN 500 MG PO TABS
1000.0000 mg | ORAL_TABLET | Freq: Once | ORAL | Status: DC
Start: 2024-05-10 — End: 2024-05-10
  Filled 2024-05-10: qty 2

## 2024-05-10 NOTE — ED Triage Notes (Signed)
 EMS called house for unwitnessed fall, on the floor for unknown time. On scene FSBS 31, Gave Glucagon and D5 Oral. FSBS recheck 100. Cold, shivering on arrival to ED. Upon arousal states she might have taken the wrong insulin  this morning.

## 2024-05-10 NOTE — ED Provider Notes (Signed)
 [facility] Provider Note    Event Date/Time   First MD Initiated Contact with Patient 05/10/24 1537     (approximate)   History  Hypoglycemia   HPI  Cindy Fischer is a 78 y.o. female with a history of diabetes, kidney transplant, PAD who presents after an episode of likely hypoglycemia.  Patient reports she "screwed up "this morning.  She reports she excellently took her short acting insulin  as opposed to her long-acting insulin  initially.  Then on top of it when she realized her mistake she decided to take her long-acting insulin  and got back into bed.  She states this was accidental she had not eaten anything, discovered by family unresponsive     Physical Exam   Triage Vital Signs: ED Triage Vitals  Encounter Vitals Group     BP 05/10/24 1537 (!) 170/97     Systolic BP Percentile --      Diastolic BP Percentile --      Pulse Rate 05/10/24 1530 87     Resp 05/10/24 1530 18     Temp 05/10/24 1537 (!) 93 F (33.9 C)     Temp Source 05/10/24 1537 Rectal     SpO2 05/10/24 1530 100 %     Weight --      Height --      Head Circumference --      Peak Flow --      Pain Score 05/10/24 1533 8     Pain Loc --      Pain Education --      Exclude from Growth Chart --     Most recent vital signs: Vitals:   05/10/24 1615 05/10/24 1700  BP:  133/71  Pulse: 65 96  Resp: 10 20  Temp:    SpO2: 100% 95%     General: Awake, no distress.  CV:  Good peripheral perfusion.  Resp:  Normal effort.  Abd:  No distention.  Other:  Neurologically intact, cranial nerves II to XII are normal   ED Results / Procedures / Treatments   Labs (all labs ordered are listed, but only abnormal results are displayed) Labs Reviewed  CBC - Abnormal; Notable for the following components:      Result Value   WBC 16.6 (*)    RDW 16.1 (*)    Platelets 628 (*)    All other components within normal limits  COMPREHENSIVE METABOLIC PANEL WITH GFR - Abnormal; Notable for  the following components:   Glucose, Bld 132 (*)    BUN 32 (*)    Total Protein 6.4 (*)    All other components within normal limits  CK - Abnormal; Notable for the following components:   Total CK 36 (*)    All other components within normal limits  URINALYSIS, ROUTINE W REFLEX MICROSCOPIC - Abnormal; Notable for the following components:   Color, Urine AMBER (*)    APPearance CLOUDY (*)    Protein, ur 30 (*)    Bacteria, UA RARE (*)    All other components within normal limits  CBG MONITORING, ED - Abnormal; Notable for the following components:   Glucose-Capillary 130 (*)    All other components within normal limits  CBG MONITORING, ED - Abnormal; Notable for the following components:   Glucose-Capillary 175 (*)    All other components within normal limits  TROPONIN I (HIGH SENSITIVITY)     EKG ED ECG REPORT I, Bryson Carbine, the attending physician, personally  viewed and interpreted this ECG.  Date: 05/10/2024  Rhythm: normal sinus rhythm QRS Axis: normal Intervals: normal ST/T Wave abnormalities: normal Narrative Interpretation: Possible Osborne waves laterally    RADIOLOGY     PROCEDURES:  Critical Care performed: yes  CRITICAL CARE Performed by: Bryson Carbine   Total critical care time: 30 minutes  Critical care time was exclusive of separately billable procedures and treating other patients.  Critical care was necessary to treat or prevent imminent or life-threatening deterioration.  Critical care was time spent personally by me on the following activities: development of treatment plan with patient and/or surrogate as well as nursing, discussions with consultants, evaluation of patient's response to treatment, examination of patient, obtaining history from patient or surrogate, ordering and performing treatments and interventions, ordering and review of laboratory studies, ordering and review of radiographic studies, pulse oximetry and re-evaluation  of patient's condition.   Procedures   MEDICATIONS ORDERED IN ED: Medications  acetaminophen  (TYLENOL ) tablet 1,000 mg (1,000 mg Oral Patient Refused/Not Given 05/10/24 1600)     IMPRESSION / MDM / ASSESSMENT AND PLAN / ED COURSE  I reviewed the triage vital signs and the nursing notes. Patient's presentation is most consistent with acute presentation with potential threat to life or bodily function.  Patient presents with episode of hypoglycemia, found by EMS with CBG 31, was given glucagon and D5 oral.  Patient's mentation is normal here, her lab work demonstrates a glucose of 132, will continue to monitor.  She is hypothermic, we do have a Lawyer and multiple blankets in place.  We are giving her orange juice  ----------------------------------------- 5:57 PM on 05/10/2024 ----------------------------------------- Patient's temperature is normalized, her temperature was 98.5, her glucose has been stable, she is feeling much better and anxious to go home, I feel this is reasonable     FINAL CLINICAL IMPRESSION(S) / ED DIAGNOSES   Final diagnoses:  Hypoglycemia     Rx / DC Orders   ED Discharge Orders     None        Note:  This document was prepared using Dragon voice recognition software and may include unintentional dictation errors.   Bryson Carbine, MD 05/10/24 Cindy Fischer

## 2024-05-10 NOTE — ED Notes (Signed)
 Patient given Orange juice for Hypoglycemia

## 2024-05-13 ENCOUNTER — Encounter (INDEPENDENT_AMBULATORY_CARE_PROVIDER_SITE_OTHER): Payer: Self-pay

## 2024-05-22 ENCOUNTER — Ambulatory Visit: Admit: 2024-05-22 | Discharge: 2024-05-23 | Payer: Medicare (Managed Care)

## 2024-05-22 ENCOUNTER — Inpatient Hospital Stay: Admit: 2024-05-22 | Discharge: 2024-05-23 | Payer: Medicare (Managed Care)

## 2024-05-26 ENCOUNTER — Other Ambulatory Visit
Admission: RE | Admit: 2024-05-26 | Discharge: 2024-05-26 | Disposition: A | Discharge status: Home / Self Care | Attending: Nephrology | Admitting: Nephrology

## 2024-05-26 DIAGNOSIS — E559 Vitamin D deficiency, unspecified: Secondary | ICD-10-CM | POA: Diagnosis present

## 2024-05-26 DIAGNOSIS — Z9483 Pancreas transplant status: Secondary | ICD-10-CM | POA: Diagnosis not present

## 2024-05-26 DIAGNOSIS — Z79899 Other long term (current) drug therapy: Secondary | ICD-10-CM | POA: Insufficient documentation

## 2024-05-26 DIAGNOSIS — B259 Cytomegaloviral disease, unspecified: Secondary | ICD-10-CM | POA: Insufficient documentation

## 2024-05-26 DIAGNOSIS — Z09 Encounter for follow-up examination after completed treatment for conditions other than malignant neoplasm: Secondary | ICD-10-CM | POA: Diagnosis not present

## 2024-05-26 DIAGNOSIS — Z789 Other specified health status: Secondary | ICD-10-CM | POA: Insufficient documentation

## 2024-05-26 DIAGNOSIS — T8610 Unspecified complication of kidney transplant: Secondary | ICD-10-CM | POA: Insufficient documentation

## 2024-05-26 DIAGNOSIS — D631 Anemia in chronic kidney disease: Secondary | ICD-10-CM | POA: Insufficient documentation

## 2024-05-26 DIAGNOSIS — N390 Urinary tract infection, site not specified: Secondary | ICD-10-CM | POA: Diagnosis not present

## 2024-05-26 DIAGNOSIS — Z114 Encounter for screening for human immunodeficiency virus [HIV]: Secondary | ICD-10-CM | POA: Insufficient documentation

## 2024-05-26 DIAGNOSIS — E1129 Type 2 diabetes mellitus with other diabetic kidney complication: Secondary | ICD-10-CM | POA: Insufficient documentation

## 2024-05-26 DIAGNOSIS — D899 Disorder involving the immune mechanism, unspecified: Secondary | ICD-10-CM | POA: Diagnosis present

## 2024-05-26 DIAGNOSIS — Z940 Kidney transplant status: Secondary | ICD-10-CM | POA: Diagnosis present

## 2024-05-26 LAB — BASIC METABOLIC PANEL WITH GFR
Anion gap: 9 (ref 5–15)
BUN: 29 mg/dL — ABNORMAL HIGH (ref 8–23)
CO2: 23 mmol/L (ref 22–32)
Calcium: 8.8 mg/dL — ABNORMAL LOW (ref 8.9–10.3)
Chloride: 100 mmol/L (ref 98–111)
Creatinine, Ser: 0.93 mg/dL (ref 0.44–1.00)
GFR, Estimated: >60 mL/min (ref >60)
Glucose, Bld: 121 mg/dL — ABNORMAL HIGH (ref 70–99)
Potassium: 4.4 mmol/L (ref 3.5–5.1)
Sodium: 132 mmol/L — ABNORMAL LOW (ref 135–145)

## 2024-05-26 LAB — CBC WITH DIFFERENTIAL/PLATELET
Abs Immature Granulocytes: 0.08 10*3/uL — ABNORMAL HIGH (ref 0.00–0.07)
Basophils Absolute: 0 10*3/uL (ref 0.0–0.1)
Basophils Relative: 0 %
Eosinophils Absolute: 0.2 10*3/uL (ref 0.0–0.5)
Eosinophils Relative: 2 %
HCT: 40.6 % (ref 36.0–46.0)
Hemoglobin: 13.5 g/dL (ref 12.0–15.0)
Immature Granulocytes: 1 %
Lymphocytes Relative: 25 %
Lymphs Abs: 2.2 10*3/uL (ref 0.7–4.0)
MCH: 29.2 pg (ref 26.0–34.0)
MCHC: 33.3 g/dL (ref 30.0–36.0)
MCV: 87.7 fL (ref 80.0–100.0)
Monocytes Absolute: 1.1 10*3/uL — ABNORMAL HIGH (ref 0.1–1.0)
Monocytes Relative: 12 %
Neutro Abs: 5.4 10*3/uL (ref 1.7–7.7)
Neutrophils Relative %: 60 %
Platelets: 711 10*3/uL — ABNORMAL HIGH (ref 150–400)
RBC: 4.63 MIL/uL (ref 3.87–5.11)
RDW: 15.2 % (ref 11.5–15.5)
WBC: 9 10*3/uL (ref 4.0–10.5)
nRBC: 0 % (ref 0.0–0.2)

## 2024-05-26 LAB — PHOSPHORUS: Phosphorus: 3.1 mg/dL (ref 2.5–4.6)

## 2024-05-26 LAB — MAGNESIUM: Magnesium: 2.1 mg/dL (ref 1.7–2.4)

## 2024-06-24 ENCOUNTER — Other Ambulatory Visit
Admission: RE | Admit: 2024-06-24 | Discharge: 2024-06-24 | Disposition: A | Discharge status: Home / Self Care | Attending: Nephrology | Admitting: Nephrology

## 2024-06-24 DIAGNOSIS — Z09 Encounter for follow-up examination after completed treatment for conditions other than malignant neoplasm: Secondary | ICD-10-CM | POA: Diagnosis not present

## 2024-06-24 DIAGNOSIS — D631 Anemia in chronic kidney disease: Secondary | ICD-10-CM | POA: Insufficient documentation

## 2024-06-24 DIAGNOSIS — N390 Urinary tract infection, site not specified: Secondary | ICD-10-CM | POA: Diagnosis not present

## 2024-06-24 DIAGNOSIS — E1122 Type 2 diabetes mellitus with diabetic chronic kidney disease: Secondary | ICD-10-CM | POA: Insufficient documentation

## 2024-06-24 DIAGNOSIS — Z79899 Other long term (current) drug therapy: Secondary | ICD-10-CM | POA: Insufficient documentation

## 2024-06-24 DIAGNOSIS — Z789 Other specified health status: Secondary | ICD-10-CM | POA: Insufficient documentation

## 2024-06-24 DIAGNOSIS — N189 Chronic kidney disease, unspecified: Secondary | ICD-10-CM | POA: Diagnosis not present

## 2024-06-24 DIAGNOSIS — Z9483 Pancreas transplant status: Secondary | ICD-10-CM | POA: Diagnosis not present

## 2024-06-24 DIAGNOSIS — Z940 Kidney transplant status: Secondary | ICD-10-CM | POA: Diagnosis not present

## 2024-06-24 DIAGNOSIS — E559 Vitamin D deficiency, unspecified: Secondary | ICD-10-CM | POA: Diagnosis not present

## 2024-06-24 DIAGNOSIS — D899 Disorder involving the immune mechanism, unspecified: Secondary | ICD-10-CM | POA: Insufficient documentation

## 2024-06-24 LAB — CBC WITH DIFFERENTIAL/PLATELET
Abs Immature Granulocytes: 0.13 10*3/uL — ABNORMAL HIGH (ref 0.00–0.07)
Basophils Absolute: 0.1 10*3/uL (ref 0.0–0.1)
Basophils Relative: 1 %
Eosinophils Absolute: 0.2 10*3/uL (ref 0.0–0.5)
Eosinophils Relative: 1 %
HCT: 41.9 % (ref 36.0–46.0)
Hemoglobin: 14.1 g/dL (ref 12.0–15.0)
Immature Granulocytes: 1 %
Lymphocytes Relative: 15 %
Lymphs Abs: 1.7 10*3/uL (ref 0.7–4.0)
MCH: 29.1 pg (ref 26.0–34.0)
MCHC: 33.7 g/dL (ref 30.0–36.0)
MCV: 86.4 fL (ref 80.0–100.0)
Monocytes Absolute: 0.9 10*3/uL (ref 0.1–1.0)
Monocytes Relative: 8 %
Neutro Abs: 8.8 10*3/uL — ABNORMAL HIGH (ref 1.7–7.7)
Neutrophils Relative %: 74 %
Platelets: 580 10*3/uL — ABNORMAL HIGH (ref 150–400)
RBC: 4.85 MIL/uL (ref 3.87–5.11)
RDW: 15.4 % (ref 11.5–15.5)
WBC: 11.7 10*3/uL — ABNORMAL HIGH (ref 4.0–10.5)
nRBC: 0 % (ref 0.0–0.2)

## 2024-06-24 LAB — BASIC METABOLIC PANEL WITH GFR
Anion gap: 9 (ref 5–15)
BUN: 21 mg/dL (ref 8–23)
CO2: 24 mmol/L (ref 22–32)
Calcium: 9.1 mg/dL (ref 8.9–10.3)
Chloride: 103 mmol/L (ref 98–111)
Creatinine, Ser: 0.81 mg/dL (ref 0.44–1.00)
GFR, Estimated: >60 mL/min (ref >60)
Glucose, Bld: 126 mg/dL — ABNORMAL HIGH (ref 70–99)
Potassium: 4.1 mmol/L (ref 3.5–5.1)
Sodium: 136 mmol/L (ref 135–145)

## 2024-06-24 LAB — PROTEIN / CREATININE RATIO, URINE
Creatinine, Urine: 122 mg/dL
Protein Creatinine Ratio: 0.06 mg/mg{creat} (ref 0.00–0.15)
Total Protein, Urine: 7 mg/dL

## 2024-06-24 LAB — URINALYSIS, COMPLETE (UACMP) WITH MICROSCOPIC
Bilirubin Urine: NEGATIVE
Glucose, UA: NEGATIVE mg/dL
Hgb urine dipstick: NEGATIVE
Ketones, ur: NEGATIVE mg/dL
Leukocytes,Ua: NEGATIVE
Nitrite: NEGATIVE
Protein, ur: NEGATIVE mg/dL
RBC / HPF: NONE SEEN RBC/hpf (ref 0–5)
Specific Gravity, Urine: 1.02 (ref 1.005–1.030)
pH: 6.5 (ref 5.0–8.0)

## 2024-06-24 LAB — PHOSPHORUS: Phosphorus: 3.1 mg/dL (ref 2.5–4.6)

## 2024-06-24 LAB — MAGNESIUM: Magnesium: 2.1 mg/dL (ref 1.7–2.4)

## 2024-06-25 LAB — URINE CULTURE

## 2024-06-25 LAB — MICROALBUMIN / CREATININE URINE RATIO
Creatinine, Urine: 109.6 mg/dL
Microalb Creat Ratio: 5 mg/g{creat} (ref 0–29)
Microalb, Ur: 5.4 ug/mL — ABNORMAL HIGH

## 2024-07-28 ENCOUNTER — Ambulatory Visit: Admit: 2024-07-28 | Discharge: 2024-07-29 | Payer: Medicare (Managed Care)

## 2024-07-28 ENCOUNTER — Other Ambulatory Visit
Admission: RE | Admit: 2024-07-28 | Discharge: 2024-07-28 | Disposition: A | Discharge status: Home / Self Care | Attending: Nephrology | Admitting: Nephrology

## 2024-07-28 DIAGNOSIS — E1129 Type 2 diabetes mellitus with other diabetic kidney complication: Secondary | ICD-10-CM | POA: Diagnosis not present

## 2024-07-28 DIAGNOSIS — D899 Disorder involving the immune mechanism, unspecified: Secondary | ICD-10-CM | POA: Insufficient documentation

## 2024-07-28 DIAGNOSIS — Z09 Encounter for follow-up examination after completed treatment for conditions other than malignant neoplasm: Secondary | ICD-10-CM | POA: Insufficient documentation

## 2024-07-28 DIAGNOSIS — Z789 Other specified health status: Secondary | ICD-10-CM | POA: Insufficient documentation

## 2024-07-28 DIAGNOSIS — Z114 Encounter for screening for human immunodeficiency virus [HIV]: Secondary | ICD-10-CM | POA: Insufficient documentation

## 2024-07-28 DIAGNOSIS — D631 Anemia in chronic kidney disease: Secondary | ICD-10-CM | POA: Insufficient documentation

## 2024-07-28 DIAGNOSIS — E559 Vitamin D deficiency, unspecified: Secondary | ICD-10-CM | POA: Diagnosis not present

## 2024-07-28 DIAGNOSIS — N390 Urinary tract infection, site not specified: Secondary | ICD-10-CM | POA: Diagnosis not present

## 2024-07-28 DIAGNOSIS — Z79899 Other long term (current) drug therapy: Secondary | ICD-10-CM | POA: Insufficient documentation

## 2024-07-28 DIAGNOSIS — T8610 Unspecified complication of kidney transplant: Secondary | ICD-10-CM | POA: Diagnosis not present

## 2024-07-28 DIAGNOSIS — B259 Cytomegaloviral disease, unspecified: Secondary | ICD-10-CM | POA: Insufficient documentation

## 2024-07-28 DIAGNOSIS — Z9483 Pancreas transplant status: Secondary | ICD-10-CM | POA: Diagnosis not present

## 2024-07-28 DIAGNOSIS — Z940 Kidney transplant status: Secondary | ICD-10-CM | POA: Diagnosis not present

## 2024-07-28 LAB — URINALYSIS, COMPLETE (UACMP) WITH MICROSCOPIC
Bacteria, UA: NONE SEEN
Bilirubin Urine: NEGATIVE
Glucose, UA: NEGATIVE mg/dL
Hgb urine dipstick: NEGATIVE
Ketones, ur: NEGATIVE mg/dL
Leukocytes,Ua: NEGATIVE
Nitrite: NEGATIVE
Protein, ur: NEGATIVE mg/dL
Specific Gravity, Urine: 1.015 (ref 1.005–1.030)
pH: 5.5 (ref 5.0–8.0)

## 2024-07-28 LAB — CBC WITH DIFFERENTIAL/PLATELET
Abs Immature Granulocytes: 0.11 K/uL — ABNORMAL HIGH (ref 0.00–0.07)
Basophils Absolute: 0 K/uL (ref 0.0–0.1)
Basophils Relative: 0 %
Eosinophils Absolute: 0.1 K/uL (ref 0.0–0.5)
Eosinophils Relative: 1 %
HCT: 40.6 % (ref 36.0–46.0)
Hemoglobin: 13.5 g/dL (ref 12.0–15.0)
Immature Granulocytes: 1 %
Lymphocytes Relative: 31 %
Lymphs Abs: 3.1 K/uL (ref 0.7–4.0)
MCH: 29.1 pg (ref 26.0–34.0)
MCHC: 33.3 g/dL (ref 30.0–36.0)
MCV: 87.5 fL (ref 80.0–100.0)
Monocytes Absolute: 1.3 K/uL — ABNORMAL HIGH (ref 0.1–1.0)
Monocytes Relative: 13 %
Neutro Abs: 5.3 K/uL (ref 1.7–7.7)
Neutrophils Relative %: 54 %
Platelets: 579 K/uL — ABNORMAL HIGH (ref 150–400)
RBC: 4.64 MIL/uL (ref 3.87–5.11)
RDW: 16.2 % — ABNORMAL HIGH (ref 11.5–15.5)
WBC: 10 K/uL (ref 4.0–10.5)
nRBC: 0 % (ref 0.0–0.2)

## 2024-07-28 LAB — MAGNESIUM: Magnesium: 1.9 mg/dL (ref 1.7–2.4)

## 2024-07-28 LAB — BASIC METABOLIC PANEL WITH GFR
Anion gap: 8 (ref 5–15)
BUN: 26 mg/dL — ABNORMAL HIGH (ref 8–23)
CO2: 28 mmol/L (ref 22–32)
Calcium: 9.2 mg/dL (ref 8.9–10.3)
Chloride: 98 mmol/L (ref 98–111)
Creatinine, Ser: 0.75 mg/dL (ref 0.44–1.00)
GFR, Estimated: >60 mL/min (ref >60)
Glucose, Bld: 77 mg/dL (ref 70–99)
Potassium: 4 mmol/L (ref 3.5–5.1)
Sodium: 134 mmol/L — ABNORMAL LOW (ref 135–145)

## 2024-07-28 LAB — PHOSPHORUS: Phosphorus: 3.1 mg/dL (ref 2.5–4.6)

## 2024-07-29 LAB — URINE CULTURE

## 2024-07-29 LAB — MICROALBUMIN / CREATININE URINE RATIO
Creatinine, Urine: 37.5 mg/dL
Microalb Creat Ratio: <8 mg/g{creat} (ref 0–29)
Microalb, Ur: <3 ug/mL — ABNORMAL HIGH

## 2024-08-27 ENCOUNTER — Other Ambulatory Visit
Admission: RE | Admit: 2024-08-27 | Discharge: 2024-08-27 | Disposition: A | Discharge status: Home / Self Care | Attending: Nephrology | Admitting: Nephrology

## 2024-08-27 DIAGNOSIS — Z114 Encounter for screening for human immunodeficiency virus [HIV]: Secondary | ICD-10-CM | POA: Diagnosis not present

## 2024-08-27 DIAGNOSIS — Z09 Encounter for follow-up examination after completed treatment for conditions other than malignant neoplasm: Secondary | ICD-10-CM | POA: Insufficient documentation

## 2024-08-27 DIAGNOSIS — E1129 Type 2 diabetes mellitus with other diabetic kidney complication: Secondary | ICD-10-CM | POA: Diagnosis not present

## 2024-08-27 DIAGNOSIS — N390 Urinary tract infection, site not specified: Secondary | ICD-10-CM | POA: Diagnosis not present

## 2024-08-27 DIAGNOSIS — Z789 Other specified health status: Secondary | ICD-10-CM | POA: Insufficient documentation

## 2024-08-27 DIAGNOSIS — Z79899 Other long term (current) drug therapy: Secondary | ICD-10-CM | POA: Insufficient documentation

## 2024-08-27 DIAGNOSIS — D631 Anemia in chronic kidney disease: Secondary | ICD-10-CM | POA: Insufficient documentation

## 2024-08-27 DIAGNOSIS — D899 Disorder involving the immune mechanism, unspecified: Secondary | ICD-10-CM | POA: Insufficient documentation

## 2024-08-27 DIAGNOSIS — B259 Cytomegaloviral disease, unspecified: Secondary | ICD-10-CM | POA: Diagnosis not present

## 2024-08-27 DIAGNOSIS — Z9483 Pancreas transplant status: Secondary | ICD-10-CM | POA: Insufficient documentation

## 2024-08-27 DIAGNOSIS — E559 Vitamin D deficiency, unspecified: Secondary | ICD-10-CM | POA: Insufficient documentation

## 2024-08-27 DIAGNOSIS — Z940 Kidney transplant status: Secondary | ICD-10-CM | POA: Diagnosis present

## 2024-08-27 LAB — URINALYSIS, COMPLETE (UACMP) WITH MICROSCOPIC
Bilirubin Urine: NEGATIVE
Glucose, UA: NEGATIVE mg/dL
Hgb urine dipstick: NEGATIVE
Ketones, ur: NEGATIVE mg/dL
Leukocytes,Ua: NEGATIVE
Nitrite: NEGATIVE
Protein, ur: NEGATIVE mg/dL
Specific Gravity, Urine: 1.02 (ref 1.005–1.030)
pH: 6 (ref 5.0–8.0)

## 2024-08-27 LAB — PROTEIN / CREATININE RATIO, URINE
Creatinine, Urine: 66 mg/dL
Total Protein, Urine: <6 mg/dL

## 2024-08-27 LAB — BASIC METABOLIC PANEL WITH GFR
Anion gap: 10 (ref 5–15)
BUN: 24 mg/dL — ABNORMAL HIGH (ref 8–23)
CO2: 25 mmol/L (ref 22–32)
Calcium: 9 mg/dL (ref 8.9–10.3)
Chloride: 100 mmol/L (ref 98–111)
Creatinine, Ser: 0.79 mg/dL (ref 0.44–1.00)
GFR, Estimated: >60 mL/min (ref >60)
Glucose, Bld: 124 mg/dL — ABNORMAL HIGH (ref 70–99)
Potassium: 4.2 mmol/L (ref 3.5–5.1)
Sodium: 135 mmol/L (ref 135–145)

## 2024-08-27 LAB — PHOSPHORUS: Phosphorus: 3 mg/dL (ref 2.5–4.6)

## 2024-08-27 LAB — MAGNESIUM: Magnesium: 1.9 mg/dL (ref 1.7–2.4)

## 2024-08-28 LAB — MICROALBUMIN / CREATININE URINE RATIO
Creatinine, Urine: 63.3 mg/dL
Microalb Creat Ratio: 6 mg/g{creat} (ref 0–29)
Microalb, Ur: 3.7 ug/mL — ABNORMAL HIGH

## 2024-08-28 LAB — URINE CULTURE

## 2024-08-29 ENCOUNTER — Ambulatory Visit: Admit: 2024-08-29 | Discharge: 2024-08-30 | Payer: Medicare (Managed Care)

## 2024-08-29 ENCOUNTER — Other Ambulatory Visit
Admission: RE | Admit: 2024-08-29 | Discharge: 2024-08-29 | Disposition: A | Discharge status: Home / Self Care | Attending: Nephrology | Admitting: Nephrology

## 2024-08-29 DIAGNOSIS — B259 Cytomegaloviral disease, unspecified: Secondary | ICD-10-CM | POA: Insufficient documentation

## 2024-08-29 DIAGNOSIS — N189 Chronic kidney disease, unspecified: Secondary | ICD-10-CM | POA: Insufficient documentation

## 2024-08-29 DIAGNOSIS — Z9483 Pancreas transplant status: Secondary | ICD-10-CM | POA: Diagnosis not present

## 2024-08-29 DIAGNOSIS — Z114 Encounter for screening for human immunodeficiency virus [HIV]: Secondary | ICD-10-CM | POA: Insufficient documentation

## 2024-08-29 DIAGNOSIS — T8610 Unspecified complication of kidney transplant: Secondary | ICD-10-CM | POA: Insufficient documentation

## 2024-08-29 DIAGNOSIS — Z79899 Other long term (current) drug therapy: Secondary | ICD-10-CM | POA: Insufficient documentation

## 2024-08-29 DIAGNOSIS — D899 Disorder involving the immune mechanism, unspecified: Secondary | ICD-10-CM | POA: Diagnosis present

## 2024-08-29 DIAGNOSIS — Z09 Encounter for follow-up examination after completed treatment for conditions other than malignant neoplasm: Secondary | ICD-10-CM | POA: Diagnosis not present

## 2024-08-29 DIAGNOSIS — Z789 Other specified health status: Secondary | ICD-10-CM | POA: Diagnosis not present

## 2024-08-29 DIAGNOSIS — E1129 Type 2 diabetes mellitus with other diabetic kidney complication: Secondary | ICD-10-CM | POA: Diagnosis not present

## 2024-08-29 DIAGNOSIS — E559 Vitamin D deficiency, unspecified: Secondary | ICD-10-CM | POA: Diagnosis not present

## 2024-08-29 DIAGNOSIS — N390 Urinary tract infection, site not specified: Secondary | ICD-10-CM | POA: Diagnosis not present

## 2024-08-29 DIAGNOSIS — D631 Anemia in chronic kidney disease: Secondary | ICD-10-CM | POA: Diagnosis not present

## 2024-08-29 LAB — CBC WITH DIFFERENTIAL/PLATELET
Abs Immature Granulocytes: 0.34 K/uL — ABNORMAL HIGH (ref 0.00–0.07)
Basophils Absolute: 0.1 K/uL (ref 0.0–0.1)
Basophils Relative: 1 %
Eosinophils Absolute: 0.1 K/uL (ref 0.0–0.5)
Eosinophils Relative: 2 %
HCT: 39.7 % (ref 36.0–46.0)
Hemoglobin: 13.5 g/dL (ref 12.0–15.0)
Immature Granulocytes: 4 %
Lymphocytes Relative: 29 %
Lymphs Abs: 2.7 K/uL (ref 0.7–4.0)
MCH: 29.2 pg (ref 26.0–34.0)
MCHC: 34 g/dL (ref 30.0–36.0)
MCV: 85.7 fL (ref 80.0–100.0)
Monocytes Absolute: 1.2 K/uL — ABNORMAL HIGH (ref 0.1–1.0)
Monocytes Relative: 13 %
Neutro Abs: 5 K/uL (ref 1.7–7.7)
Neutrophils Relative %: 51 %
Platelets: 619 K/uL — ABNORMAL HIGH (ref 150–400)
RBC: 4.63 MIL/uL (ref 3.87–5.11)
RDW: 16.1 % — ABNORMAL HIGH (ref 11.5–15.5)
WBC: 9.4 K/uL (ref 4.0–10.5)
nRBC: 0 % (ref 0.0–0.2)

## 2024-09-22 ENCOUNTER — Ambulatory Visit (INDEPENDENT_AMBULATORY_CARE_PROVIDER_SITE_OTHER)

## 2024-09-22 ENCOUNTER — Ambulatory Visit: Admitting: Podiatry

## 2024-09-22 VITALS — Ht 61.0 in | Wt 146.0 lb

## 2024-09-22 DIAGNOSIS — M19071 Primary osteoarthritis, right ankle and foot: Secondary | ICD-10-CM

## 2024-09-23 NOTE — Progress Notes (Signed)
  Subjective:  Patient ID: Cindy Fischer, female    DOB: 10-Jun-1946,  MRN: [id number]  Chief Complaint  Patient presents with   Toe Pain     Rm 8 R 1st toe pain and possible need injection    78 y.o. female presents with the above complaint. History confirmed with patient.  Pain has returned in the joint in the toe  Objective:  Physical Exam: warm, good capillary refill, no trophic changes or ulcerative lesions, weakly palpable DP and PT pulses, and normal sensory exam.  Right Foot: Pain with palpation and range of motion of the interphalangeal joint of the hallux right   Radiographs of the right foot show degenerative arthritis of the interphalangeal joint with subchondral fracture  Assessment:   1. Degenerative arthritis of toe joint, right      Plan:  Patient was evaluated and treated and all questions answered.  Pain today has returned in the IPJ of the hallux.  We reviewed her x-rays which shows severe arthritic changes.  She previously did well with prior injection.  We discussed treatment of this including corticosteroid injection and excision of the fragments with arthroplasty.   Following verbal consent, 0.5 cc of 0.5% Marcaine and 20 mg of Kenalog were injected into the hallux IPJ after Betadine prep.  She tolerated well.  Follow-up as needed for this.  Return if symptoms worsen or fail to improve.

## 2024-09-24 ENCOUNTER — Ambulatory Visit: Admit: 2024-09-24 | Discharge: 2024-09-25 | Payer: Medicare (Managed Care)

## 2024-09-24 ENCOUNTER — Other Ambulatory Visit
Admission: RE | Admit: 2024-09-24 | Discharge: 2024-09-24 | Disposition: A | Discharge status: Home / Self Care | Attending: Nephrology | Admitting: Nephrology

## 2024-09-24 DIAGNOSIS — Z9483 Pancreas transplant status: Secondary | ICD-10-CM | POA: Diagnosis not present

## 2024-09-24 DIAGNOSIS — B259 Cytomegaloviral disease, unspecified: Secondary | ICD-10-CM | POA: Insufficient documentation

## 2024-09-24 DIAGNOSIS — Z79899 Other long term (current) drug therapy: Secondary | ICD-10-CM | POA: Insufficient documentation

## 2024-09-24 DIAGNOSIS — E1129 Type 2 diabetes mellitus with other diabetic kidney complication: Secondary | ICD-10-CM | POA: Insufficient documentation

## 2024-09-24 DIAGNOSIS — D899 Disorder involving the immune mechanism, unspecified: Secondary | ICD-10-CM | POA: Diagnosis present

## 2024-09-24 DIAGNOSIS — N390 Urinary tract infection, site not specified: Secondary | ICD-10-CM | POA: Insufficient documentation

## 2024-09-24 DIAGNOSIS — E559 Vitamin D deficiency, unspecified: Secondary | ICD-10-CM | POA: Diagnosis present

## 2024-09-24 DIAGNOSIS — Z789 Other specified health status: Secondary | ICD-10-CM | POA: Diagnosis not present

## 2024-09-24 DIAGNOSIS — T8610 Unspecified complication of kidney transplant: Secondary | ICD-10-CM | POA: Diagnosis not present

## 2024-09-24 DIAGNOSIS — Z09 Encounter for follow-up examination after completed treatment for conditions other than malignant neoplasm: Secondary | ICD-10-CM | POA: Insufficient documentation

## 2024-09-24 DIAGNOSIS — D631 Anemia in chronic kidney disease: Secondary | ICD-10-CM | POA: Insufficient documentation

## 2024-09-24 DIAGNOSIS — Z114 Encounter for screening for human immunodeficiency virus [HIV]: Secondary | ICD-10-CM | POA: Insufficient documentation

## 2024-09-24 DIAGNOSIS — Z940 Kidney transplant status: Secondary | ICD-10-CM | POA: Insufficient documentation

## 2024-09-24 LAB — URINALYSIS, W/ REFLEX TO CULTURE (INFECTION SUSPECTED)
Bacteria, UA: NONE SEEN
Bilirubin Urine: NEGATIVE
Glucose, UA: NEGATIVE mg/dL
Hgb urine dipstick: NEGATIVE
Ketones, ur: NEGATIVE mg/dL
Leukocytes,Ua: NEGATIVE
Nitrite: NEGATIVE
Protein, ur: NEGATIVE mg/dL
RBC / HPF: NONE SEEN RBC/hpf (ref 0–5)
Specific Gravity, Urine: <1.005 — ABNORMAL LOW (ref 1.005–1.030)
Squamous Epithelial / HPF: NONE SEEN /HPF (ref 0–5)
WBC, UA: NONE SEEN WBC/hpf (ref 0–5)
pH: 5.5 (ref 5.0–8.0)

## 2024-09-24 LAB — CBC WITH DIFFERENTIAL/PLATELET
Abs Immature Granulocytes: 0.13 K/uL — ABNORMAL HIGH (ref 0.00–0.07)
Basophils Absolute: 0 K/uL (ref 0.0–0.1)
Basophils Relative: 0 %
Eosinophils Absolute: 0.1 K/uL (ref 0.0–0.5)
Eosinophils Relative: 1 %
HCT: 41.6 % (ref 36.0–46.0)
Hemoglobin: 13.9 g/dL (ref 12.0–15.0)
Immature Granulocytes: 1 %
Lymphocytes Relative: 30 %
Lymphs Abs: 3.1 K/uL (ref 0.7–4.0)
MCH: 29.3 pg (ref 26.0–34.0)
MCHC: 33.4 g/dL (ref 30.0–36.0)
MCV: 87.8 fL (ref 80.0–100.0)
Monocytes Absolute: 1.2 K/uL — ABNORMAL HIGH (ref 0.1–1.0)
Monocytes Relative: 11 %
Neutro Abs: 6 K/uL (ref 1.7–7.7)
Neutrophils Relative %: 57 %
Platelets: 622 K/uL — ABNORMAL HIGH (ref 150–400)
RBC: 4.74 MIL/uL (ref 3.87–5.11)
RDW: 16.4 % — ABNORMAL HIGH (ref 11.5–15.5)
WBC: 10.5 K/uL (ref 4.0–10.5)
nRBC: 0 % (ref 0.0–0.2)

## 2024-09-24 LAB — BASIC METABOLIC PANEL WITH GFR
Anion gap: 10 (ref 5–15)
BUN: 37 mg/dL — ABNORMAL HIGH (ref 8–23)
CO2: 22 mmol/L (ref 22–32)
Calcium: 8.8 mg/dL — ABNORMAL LOW (ref 8.9–10.3)
Chloride: 99 mmol/L (ref 98–111)
Creatinine, Ser: 0.84 mg/dL (ref 0.44–1.00)
GFR, Estimated: >60 mL/min (ref >60)
Glucose, Bld: 131 mg/dL — ABNORMAL HIGH (ref 70–99)
Potassium: 4.3 mmol/L (ref 3.5–5.1)
Sodium: 131 mmol/L — ABNORMAL LOW (ref 135–145)

## 2024-09-24 LAB — MAGNESIUM: Magnesium: 2.2 mg/dL (ref 1.7–2.4)

## 2024-09-24 LAB — PROTEIN / CREATININE RATIO, URINE
Creatinine, Urine: 24 mg/dL
Total Protein, Urine: <6 mg/dL

## 2024-09-24 LAB — PHOSPHORUS: Phosphorus: 2.5 mg/dL (ref 2.5–4.6)

## 2024-09-25 LAB — MICROALBUMIN / CREATININE URINE RATIO
Creatinine, Urine: 27.9 mg/dL
Microalb Creat Ratio: <11 mg/g{creat} (ref 0–29)
Microalb, Ur: <3 ug/mL — ABNORMAL HIGH

## 2024-09-27 ENCOUNTER — Emergency Department
Admission: EM | Admit: 2024-09-27 | Discharge: 2024-09-27 | Disposition: A | Discharge status: Home / Self Care | Attending: Emergency Medicine | Admitting: Emergency Medicine

## 2024-09-27 ENCOUNTER — Other Ambulatory Visit: Payer: Self-pay

## 2024-09-27 DIAGNOSIS — S51812A Laceration without foreign body of left forearm, initial encounter: Secondary | ICD-10-CM | POA: Diagnosis not present

## 2024-09-27 DIAGNOSIS — Z91040 Latex allergy status: Secondary | ICD-10-CM | POA: Insufficient documentation

## 2024-09-27 DIAGNOSIS — Z7902 Long term (current) use of antithrombotics/antiplatelets: Secondary | ICD-10-CM | POA: Diagnosis not present

## 2024-09-27 DIAGNOSIS — W268XXA Contact with other sharp object(s), not elsewhere classified, initial encounter: Secondary | ICD-10-CM | POA: Insufficient documentation

## 2024-09-27 DIAGNOSIS — S59912A Unspecified injury of left forearm, initial encounter: Secondary | ICD-10-CM | POA: Diagnosis present

## 2024-09-27 NOTE — ED Provider Notes (Signed)
 [place] [facility] AT [facility] Provider Note   CSN: [id number] Arrival date & time: 09/27/24  9156     Patient presents with: Laceration   Cindy Fischer is a 78 y.o. female presents to the emergency department for evaluation of skin tear to the dorsum of her left forearm.  Patient was checking the mail out her car window.  Accidentally took her foot off the brake and as the car moved forward she scraped the dorsum of her left forearm.  She denies any bone pain, numbness, tingling, fall or any other injury to her body.  Her tetanus is up-to-date.  This accident occurred 2 days ago.  She has been keeping it covered with a nonbreathable Tegaderm material, she has had a little bit of oozing/bleeding but no active bleeding currently.     HPI     Prior to Admission medications   Medication Sig Start Date End Date Taking? Authorizing Provider  acetaminophen  (TYLENOL ) 650 MG CR tablet Take 1,300 mg by mouth every 8 (eight) hours as needed for pain.    [provider]  atorvastatin  (LIPITOR) 20 MG tablet Take 20 mg by mouth daily.    [provider]  Cholecalciferol (VITAMIN D) 2000 units tablet Take 2,000 Units by mouth daily.    [provider]  clindamycin  (CLEOCIN ) 300 MG capsule Take 1 capsule (300 mg total) by mouth 3 (three) times daily. 03/26/24   Silva Juliene SAUNDERS, DPM  clopidogrel  (PLAVIX ) 75 MG tablet TAKE 1 TABLET EVERY DAY 01/07/24   Brown, Fallon E, NP  gentamicin  ointment (GARAMYCIN ) 0.1 % Apply 1 Application topically daily. Apply to wound daily 03/26/24   McDonald, Juliene SAUNDERS, DPM  insulin  NPH Human (HUMULIN N,NOVOLIN N) 100 UNIT/ML injection Inject 25-28 Units into the skin daily before breakfast.    [provider]  insulin  regular (NOVOLIN R,HUMULIN R ) 100 units/mL injection Inject 3-10 Units into the skin 3 (three) times daily as needed for high blood sugar. Sliding scale    [provider]  levothyroxine  (SYNTHROID ,  LEVOTHROID) 100 MCG tablet Take 100 mcg by mouth See admin instructions. Take 100 mcg daily except skip dose on Saturdays    [provider]  lisinopril  (PRINIVIL ,ZESTRIL ) 10 MG tablet Take 10 mg by mouth daily.    [provider]  omeprazole (PRILOSEC) 40 MG capsule Take 40 mg by mouth every other day. 06/26/18   [provider]  predniSONE  (DELTASONE ) 5 MG tablet Take 5 mg by mouth daily.  05/08/18   [provider]  tacrolimus  (PROGRAF ) 0.5 MG capsule Take 1-1.5 mg by mouth See admin instructions. Take 1.5 mg in the morning and 1 mg in the evening    [provider]    Allergies: Latex, Voltaren [diclofenac sodium], Oxycodone -acetaminophen , and Penicillins    Review of Systems  Updated Vital Signs BP (!) 166/70 (BP Location: Right Arm)   Pulse 80   Temp 98.6 F (37 C) (Oral)   Resp 16   Ht 5' 1 (1.549 m)   Wt 66.2 kg   SpO2 100%   BMI 27.59 kg/m   Physical Exam Constitutional:      Appearance: She is well-developed.  HENT:     Head: Normocephalic and atraumatic.  Eyes:     Conjunctiva/sclera: Conjunctivae normal.  Cardiovascular:     Rate and Rhythm: Normal rate.  Pulmonary:     Effort: Pulmonary effort is normal. No respiratory distress.  Musculoskeletal:  General: Normal range of motion.     Cervical back: Normal range of motion.     Comments: Normal range of motion of the left upper extremity of the digits wrist and forearm.  Compartments are soft no pain with palpation over the bones.  She has superficial abrasions/skin tears on the dorsum of the forearm, Tegaderm material removed and moist granulation tissue present underneath.  This was debrided and cleansed with Betadine and saline.  Skin appears to be healing and well with no signs of underlying cellulitis or infection.  No active bleeding noted.  Skin was cleansed again with Betadine and saline and a Vaseline gauze dressing was placed along with Curlex and Ace wrap.   She was educated on dressing changes.  Skin:    General: Skin is warm.     Findings: No rash.  Neurological:     Mental Status: She is alert and oriented to person, place, and time.  Psychiatric:        Behavior: Behavior normal.        Thought Content: Thought content normal.     (all labs ordered are listed, but only abnormal results are displayed) Labs Reviewed - No data to display  EKG: None  Radiology: No results found.   Procedures   Medications Ordered in the ED - No data to display                                  Medical Decision Making  78 year old female with skin tear to the dorsum of her left forearm 2 days ago.  Her tetanus is up-to-date.  She is concerned about drainage/slight bleeding underneath a nonbreathable Tegaderm dressing.  This was removed, moist granulation tissue removed from the surface of the skin and cleansed with Betadine and saline and underlying skin appears well with no active bleeding or signs of cellulitis.  She was educated on changing from Tegaderm material to a Vaseline gauze material Kerlix to allow for better wound healing.  No signs of orthopedic injury on exam.  She is neurovascular intact.  She is educated on wound care over the next week and will follow-up with primary care provider in 1 week for recheck.  She understands signs symptoms return to the ED for.  Final diagnoses:  Skin tear of left forearm without complication, initial encounter    ED Discharge Orders     None          Holly Pring C, PA-C 09/27/24 0932    Arlander Charleston, MD 09/27/24 1000

## 2024-09-27 NOTE — ED Triage Notes (Signed)
 Pt to ED for lacerations to L arm from catching arm in the mailbox 2 days ago. Pt states there are about 4 lacerations but they are currently covered. Last Tdap <5 years. Hx kidney transplant. Ambulatory w cane.

## 2024-09-27 NOTE — Discharge Instructions (Signed)
 Please continue with every other day dressing changes.  You may cleanse skin in the shower with a good antibacterial soap after removing the dressing and before applying the new dressing.  Apply Vaseline gauze directly over the skin followed by a white gauze and an Ace wrap.  Once skin is completely dry, you can discontinue things.  Return to the ER for any warmth redness fevers or any urgent changes in your health

## 2024-10-26 ENCOUNTER — Other Ambulatory Visit (INDEPENDENT_AMBULATORY_CARE_PROVIDER_SITE_OTHER): Payer: Self-pay | Admitting: Nurse Practitioner

## 2024-10-27 ENCOUNTER — Encounter (INDEPENDENT_AMBULATORY_CARE_PROVIDER_SITE_OTHER): Payer: Self-pay | Admitting: Vascular Surgery

## 2024-10-27 ENCOUNTER — Ambulatory Visit (INDEPENDENT_AMBULATORY_CARE_PROVIDER_SITE_OTHER): Admitting: Vascular Surgery

## 2024-10-27 ENCOUNTER — Ambulatory Visit (INDEPENDENT_AMBULATORY_CARE_PROVIDER_SITE_OTHER)

## 2024-10-27 VITALS — BP 136/79 | HR 99 | Resp 18 | Ht 60.0 in | Wt 150.0 lb

## 2024-10-27 DIAGNOSIS — E1159 Type 2 diabetes mellitus with other circulatory complications: Secondary | ICD-10-CM

## 2024-10-27 DIAGNOSIS — I70213 Atherosclerosis of native arteries of extremities with intermittent claudication, bilateral legs: Secondary | ICD-10-CM | POA: Diagnosis not present

## 2024-10-27 DIAGNOSIS — E785 Hyperlipidemia, unspecified: Secondary | ICD-10-CM

## 2024-10-27 DIAGNOSIS — I890 Lymphedema, not elsewhere classified: Secondary | ICD-10-CM

## 2024-10-27 DIAGNOSIS — E1169 Type 2 diabetes mellitus with other specified complication: Secondary | ICD-10-CM | POA: Diagnosis not present

## 2024-10-27 DIAGNOSIS — I10 Essential (primary) hypertension: Secondary | ICD-10-CM

## 2024-10-27 DIAGNOSIS — Z794 Long term (current) use of insulin: Secondary | ICD-10-CM

## 2024-10-27 NOTE — Progress Notes (Unsigned)
 MRN : [id number]  Cindy Fischer is a 78 y.o. (1946-04-29) female who presents with chief complaint of check circulation.  History of Present Illness:   The patient returns to the office for followup and review of the noninvasive studies. There have been no interval changes in lower extremity symptoms. No interval shortening of the patient's claudication distance or development of rest pain symptoms. No new ulcers or wounds have occurred since the last visit.   She is also noting increased edema particularly with dependency.  She notes that it is minimal in the morning but worsens steadily throughout the day.  She has been using compression socks but these are not controlling her swelling very well.     The patient denies amaurosis fugax or recent TIA symptoms. There are no recent neurological changes noted. The patient denies history of DVT, PE or superficial thrombophlebitis. The patient denies recent episodes of angina or shortness of breath.    ABI Rt=[place] (TBI=0.48) and Lt=[place] (TBI=0.35)  (previous ABI Rt=[place] (TBI=0.32) and Lt=[place] (TBI=0.23))   Duplex ultrasound of the bilateral lower extremity arterial system demonstrates of the SFA/popliteal bilaterally.  No outpatient medications have been marked as taking for the 10/27/24 encounter (Appointment) with Jama, Cordella MATSU, MD.    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    Past Surgical History:  Procedure Laterality Date   CATARACT EXTRACTION, BILATERAL     COLONOSCOPY  12/02/2010   COLONOSCOPY WITH PROPOFOL  N/A 09/04/2016   Procedure: COLONOSCOPY WITH PROPOFOL ;  Surgeon: Louanne MATSU Muse,  MD;  Location: [facility] ENDOSCOPY;  Service: Endoscopy;  Laterality: N/A;   EYE SURGERY     INSERTION OF DIALYSIS CATHETER  2014   removed 2014 (only had in for about 8 weeks)   laser surgery on eyes  1999   LOWER EXTREMITY ANGIOGRAPHY Right 10/16/2018   Procedure: LOWER EXTREMITY ANGIOGRAPHY;  Surgeon: Jama Cordella MATSU, MD;  Location: [facility] INVASIVE CV LAB;  Service: Cardiovascular;  Laterality: Right;   LOWER EXTREMITY ANGIOGRAPHY Right 02/08/2021   Procedure: LOWER EXTREMITY ANGIOGRAPHY;  Surgeon: Jama Cordella MATSU, MD;  Location: [facility] INVASIVE CV LAB;  Service: Cardiovascular;  Laterality: Right;   LOWER EXTREMITY ANGIOGRAPHY Right 03/02/2021   Procedure: LOWER EXTREMITY ANGIOGRAPHY;  Surgeon: Jama Cordella MATSU, MD;  Location: [facility] INVASIVE CV LAB;  Service: Cardiovascular;  Laterality: Right;   NEPHRECTOMY TRANSPLANTED ORGAN  10/20/2014   SPINE SURGERY  11/10/2021   TONSILLECTOMY     TUBAL LIGATION      Social History Social History   Tobacco Use   Smoking status: Former    Current packs/day: 0.00    Average packs/day: 1 pack/day for 35.0 years (35.0 ttl pk-yrs)    Types: Cigarettes    Start date: 33    Quit  date: 2015    Years since quitting: 10.8   Smokeless tobacco: Never  Vaping Use   Vaping status: Never Used  Substance Use Topics   Alcohol use: No   Drug use: No    Family History Family History  Problem Relation Age of Onset   Multiple myeloma Mother    Diabetes Father    Heart attack Father 8   Breast cancer Neg Hx     Allergies  Allergen Reactions   Latex Itching    Patient states that she is NOT allergic to latex - 05/26/2023   Voltaren [Diclofenac Sodium] Diarrhea and Nausea And Vomiting    Patient states that she is NOT allergic to Voltaren - 05/26/2023   Oxycodone -Acetaminophen  Nausea And Vomiting    Patient states that she is NOT allergic to Oxycodone -acetaminopehn - 05/26/2023   Penicillins Rash    Has patient had a PCN reaction causing immediate  rash, facial/tongue/throat swelling, SOB or lightheadedness with hypotension: Yes Has patient had a PCN reaction causing severe rash involving mucus membranes or skin necrosis: No Has patient had a PCN reaction that required hospitalization: No Has patient had a PCN reaction occurring within the last 10 years: No If all of the above answers are NO, then may proceed with Cephalosporin use.      REVIEW OF SYSTEMS (Negative unless checked)  Constitutional: [] Weight loss  [] Fever  [] Chills Cardiac: [] Chest pain   [] Chest pressure   [] Palpitations   [] Shortness of breath when laying flat   [] Shortness of breath with exertion. Vascular:  [x] Pain in legs with walking   [] Pain in legs at rest  [] History of DVT   [] Phlebitis   [] Swelling in legs   [] Varicose veins   [] Non-healing ulcers Pulmonary:   [] Uses home oxygen   [] Productive cough   [] Hemoptysis   [] Wheeze  [] COPD   [] Asthma Neurologic:  [] Dizziness   [] Seizures   [] History of stroke   [] History of TIA  [] Aphasia   [] Vissual changes   [] Weakness or numbness in arm   [] Weakness or numbness in leg Musculoskeletal:   [] Joint swelling   [] Joint pain   [] Low back pain Hematologic:  [] Easy bruising  [] Easy bleeding   [] Hypercoagulable state   [] Anemic Gastrointestinal:  [] Diarrhea   [] Vomiting  [x] Gastroesophageal reflux/heartburn   [] Difficulty swallowing. Genitourinary:  [] Chronic kidney disease   [] Difficult urination  [] Frequent urination   [] Blood in urine Skin:  [] Rashes   [] Ulcers  Psychological:  [] History of anxiety   []  History of major depression.  Physical Examination  There were no vitals filed for this visit. There is no height or weight on file to calculate BMI. Gen: WD/WN, NAD Head: [place]/AT, No temporalis wasting.  Ear/Nose/Throat: Hearing grossly intact, nares w/o erythema or drainage Eyes: PER, EOMI, sclera nonicteric.  Neck: Supple, no masses.  No bruit or JVD.  Pulmonary:  Good air movement, no audible wheezing, no use of  accessory muscles.  Cardiac: RRR, normal S1, S2, no Murmurs. Vascular:  mild trophic changes, no open wounds Vessel Right Left  Radial Palpable Palpable  PT Not Palpable Not Palpable  DP Not Palpable Not Palpable  Gastrointestinal: soft, non-distended. No guarding/no peritoneal signs.  Musculoskeletal: M/S 5/5 throughout.  No visible deformity.  Neurologic: CN 2-12 intact. Pain and light touch intact in extremities.  Symmetrical.  Speech is fluent. Motor exam as listed above. Psychiatric: Judgment intact, Mood & affect appropriate for pt's clinical situation. Dermatologic: No rashes or ulcers noted.  No changes consistent  with cellulitis.   CBC Lab Results  Component Value Date   WBC 10.5 09/24/2024   HGB 13.9 09/24/2024   HCT 41.6 09/24/2024   MCV 87.8 09/24/2024   PLT 622 (H) 09/24/2024    BMET    Component Value Date/Time   NA 131 (L) 09/24/2024 0926   NA 138 03/29/2015 0918   K 4.3 09/24/2024 0926   K 4.5 03/29/2015 0918   CL 99 09/24/2024 0926   CL 104 03/29/2015 0918   CO2 22 09/24/2024 0926   CO2 26 03/29/2015 0918   GLUCOSE 131 (H) 09/24/2024 0926   GLUCOSE 187 (H) 03/29/2015 0918   BUN 37 (H) 09/24/2024 0926   BUN 10 03/29/2015 0918   CREATININE 0.84 09/24/2024 0926   CREATININE 0.80 03/29/2015 0918   CALCIUM  8.8 (L) 09/24/2024 0926   CALCIUM  9.1 03/29/2015 0918   GFRNONAA >60 09/24/2024 0926   GFRNONAA >60 03/29/2015 0918   GFRAA >60 09/24/2020 0836   GFRAA >60 03/29/2015 0918   CrCl cannot be calculated (Patient's most recent lab result is older than the maximum 21 days allowed.).  COAG No results found for: INR, PROTIME  Radiology No results found.   Assessment/Plan 1. Atherosclerosis of native artery of both lower extremities with intermittent claudication (Primary)  Recommend:  The patient has evidence of atherosclerosis of the lower extremities with claudication.  The patient does not voice lifestyle limiting changes at this point in  time.  Noninvasive studies do not suggest clinically significant change.  No invasive studies, angiography or surgery at this time The patient should continue walking and begin a more formal exercise program.  The patient should continue antiplatelet therapy and aggressive treatment of the lipid abnormalities  No changes in the patient's medications at this time  Continued surveillance is indicated as atherosclerosis is likely to progress with time.    The patient will continue follow up with noninvasive studies as ordered.  - VAS US  ABI WITH/WO TBI; Future  2. Lymphedema Recommend:  No surgery or intervention at this point in time.   The Patient is CEAP [id number].  The patient has been wearing compression for more than 12 weeks with no or little benefit.  The patient has been exercising daily for more than 12 weeks. The patient has been elevating and taking OTC pain medications for more than 12 weeks.  None of these have have eliminated the pain related to the lymphedema or the discomfort regarding excessive swelling and venous congestion.    I have reviewed my discussion with the patient regarding lymphedema and why it  causes symptoms.  Patient will continue wearing graduated compression on a daily basis. The patient should put the compression on first thing in the morning and removing them in the evening. The patient should not sleep in the compression.   In addition, behavioral modification throughout the day will be continued.  This will include frequent elevation (such as in a recliner), use of over the counter pain medications as needed and exercise such as walking.  The systemic causes for chronic edema such as liver, kidney and cardiac etiologies do not appear to have significant changed over the past year.    The patient has chronic , severe lymphedema with hyperpigmentation of the skin and has done MLD, skin care, medication, diet, exercise, elevation and compression for 4  weeks with no improvement,  I am recommending a lymphedema pump.  The patient still has stage 3 lymphedema and therefore, I believe that a  lymph pump is needed to improve the control of the patient's lymphedema and improve the quality of life.  Additionally, a lymph pump is warranted because it will reduce the risk of cellulitis and ulceration in the future.  Patient should follow-up in six months   3. Essential hypertension Continue antihypertensive medications as already ordered, these medications have been reviewed and there are no changes at this time.  4. Hyperlipidemia associated with type 2 diabetes mellitus (HCC) Continue statin as ordered and reviewed, no changes at this time  5. Type 2 diabetes mellitus with other circulatory complication, with long-term current use of insulin  (HCC) Continue hypoglycemic medications as already ordered, these medications have been reviewed and there are no changes at this time.  Hgb A1C to be monitored as already arranged by primary service    Cordella Shawl, MD  10/27/2024 12:41 PM

## 2024-10-29 ENCOUNTER — Encounter (INDEPENDENT_AMBULATORY_CARE_PROVIDER_SITE_OTHER): Payer: Self-pay | Admitting: Vascular Surgery

## 2024-10-30 DIAGNOSIS — E119 Type 2 diabetes mellitus without complications: Principal | ICD-10-CM

## 2024-10-30 DIAGNOSIS — Z940 Kidney transplant status: Principal | ICD-10-CM

## 2024-10-30 DIAGNOSIS — Z79899 Other long term (current) drug therapy: Principal | ICD-10-CM

## 2024-10-30 MED ORDER — PREDNISONE 5 MG TABLET
ORAL_TABLET | Freq: Every day | ORAL | 3 refills | 90.00000 days | Status: CP
Start: 2024-10-30 — End: ?

## 2024-10-30 MED ORDER — SIROLIMUS 0.5 MG TABLET
ORAL_TABLET | ORAL | 3 refills | 0.00000 days | Status: CP
Start: 2024-10-30 — End: ?

## 2024-10-30 MED ORDER — NOVOLIN R REGULAR U-100 INSULIN 100 UNIT/ML INJECTION SOLUTION
Freq: Two times a day (BID) | SUBCUTANEOUS | 11 refills | 30.00000 days | Status: CP
Start: 2024-10-30 — End: 2025-10-30

## 2024-10-31 LAB — VAS US ABI WITH/WO TBI
Left ABI: 1.43
Right ABI: 1.49

## 2024-12-01 DIAGNOSIS — Z940 Kidney transplant status: Principal | ICD-10-CM

## 2024-12-01 DIAGNOSIS — R8279 Other abnormal findings on microbiological examination of urine: Principal | ICD-10-CM

## 2024-12-03 ENCOUNTER — Ambulatory Visit: Admit: 2024-12-03 | Discharge: 2024-12-03 | Payer: Medicare (Managed Care) | Attending: Nephrology | Primary: Nephrology

## 2024-12-03 ENCOUNTER — Ambulatory Visit: Admit: 2024-12-03 | Discharge: 2024-12-03 | Payer: Medicare (Managed Care)

## 2024-12-03 DIAGNOSIS — R8279 Other abnormal findings on microbiological examination of urine: Principal | ICD-10-CM

## 2024-12-03 DIAGNOSIS — Z79899 Other long term (current) drug therapy: Principal | ICD-10-CM

## 2024-12-03 DIAGNOSIS — Z940 Kidney transplant status: Principal | ICD-10-CM

## 2024-12-03 DIAGNOSIS — Z48298 Encounter for aftercare following other organ transplant: Principal | ICD-10-CM

## 2024-12-03 DIAGNOSIS — D849 Immunodeficiency, unspecified: Principal | ICD-10-CM

## 2024-12-03 DIAGNOSIS — Z85828 Personal history of other malignant neoplasm of skin: Principal | ICD-10-CM

## 2024-12-03 DIAGNOSIS — I10 Essential (primary) hypertension: Principal | ICD-10-CM

## 2024-12-15 ENCOUNTER — Ambulatory Visit

## 2024-12-15 ENCOUNTER — Ambulatory Visit: Admitting: Podiatry

## 2024-12-15 DIAGNOSIS — L02619 Cutaneous abscess of unspecified foot: Secondary | ICD-10-CM | POA: Diagnosis not present

## 2024-12-15 DIAGNOSIS — L03039 Cellulitis of unspecified toe: Secondary | ICD-10-CM | POA: Diagnosis not present

## 2024-12-15 DIAGNOSIS — L97522 Non-pressure chronic ulcer of other part of left foot with fat layer exposed: Secondary | ICD-10-CM

## 2024-12-15 DIAGNOSIS — L97521 Non-pressure chronic ulcer of other part of left foot limited to breakdown of skin: Secondary | ICD-10-CM

## 2024-12-15 MED ORDER — MUPIROCIN 2 % EX OINT: 1.0000 | TOPICAL_OINTMENT | Freq: Every day | CUTANEOUS | 2 refills | Status: DC

## 2024-12-15 MED ORDER — DOXYCYCLINE HYCLATE 100 MG PO TABS: 100.0000 mg | ORAL_TABLET | Freq: Two times a day (BID) | ORAL | 0 refills | Status: AC

## 2024-12-16 NOTE — Progress Notes (Signed)
"  °  Subjective:  Patient ID: Cindy Fischer, female    DOB: [birth year],  MRN: [id number]  Chief Complaint  Patient presents with   Wound Check    RM 8 Patient is her for wound on left 3rd toe and right 1st toe. Wounds are scabbed over, left 3rd toe is red and swollen.    78 y.o. female presents with the above complaint. History confirmed with patient.  Returns today with new issues and sores on the right great toe and left third toe  Objective:  Physical Exam: Diffuse polyneuropathy she has a full-thickness ulcer with exposed subcutaneous tissue on the right hallux IP joint, left third toe proximal to the nail fold with an eschar, has not had any drainage there is cellulitis to the metatarsophalangeal joint no malodor     Radiographs: Multiple views x-ray of both feet: no soft tissue emphysema, no signs of osteomyelitis   Toe pressure on left 49, 67 on the right on ABIs 10/27/2024 Assessment:   1. Skin ulcer of left great toe with fat layer exposed (HCC)   2. Skin ulcer of second toe of left foot with fat layer exposed (HCC)   3. Cellulitis and abscess of toe, unspecified laterality      Plan:  Patient was evaluated and treated and all questions answered.  Discussion of cellulitis secondary to ulceration on the left third and right great toe.  I recommended utilizing Bactroban  ointment and doxycycline  to control the cellulitis I prescribed both of these for her today and reviewed its use and possible side effects.  I reviewed her ABIs from 10/27/2024 which showed improvement from her prior studies and the toe pressures of 49 and 67 on the left and right respectively.  Hopefully she is able to heal secondarily with local wound care.  Follow-up in 3 weeks to reevaluate I advised her if there is worsening cellulitis fevers chills nausea vomiting or development of purulence that would recommend she present to the emergency room for admission and MRI.    No follow-ups on file.   "

## 2024-12-29 DIAGNOSIS — Z940 Kidney transplant status: Principal | ICD-10-CM

## 2025-01-12 ENCOUNTER — Ambulatory Visit: Admitting: Podiatry

## 2025-01-12 VITALS — Ht 60.0 in | Wt 150.0 lb

## 2025-01-12 DIAGNOSIS — L97522 Non-pressure chronic ulcer of other part of left foot with fat layer exposed: Secondary | ICD-10-CM

## 2025-01-12 NOTE — Progress Notes (Signed)
"  °  Subjective:  Patient ID: Cindy Fischer, female    DOB: 1946-06-03,  MRN: [id number]  Chief Complaint  Patient presents with   Foot Ulcer    RM 9 Patient is here for ulcer of the right hallux (scabbed with no visible drainage) and ulcer of the left 3rd toe  (scabbed, with no drainage).    79 y.o. female presents with the above complaint. History confirmed with patient.  Returns today for follow-up has been using the Bactroban  ointment as prescribed.  Objective:  Physical Exam: Diffuse polyneuropathy she has a full-thickness ulcer with exposed subcutaneous tissue on the right hallux IP joint, left third toe proximal to the nail fold with an eschar, has not had any drainage mild periwound erythema there is no cellulitis currently.  No active drainage other ulcer.     Radiographs: Multiple views x-ray of both feet: no soft tissue emphysema, no signs of osteomyelitis   Toe pressure on left 49, 67 on the right on ABIs 10/27/2024 Assessment:   1. Skin ulcer of left great toe with fat layer exposed (HCC)   2. Skin ulcer of second toe of left foot with fat layer exposed (HCC)      Plan:  Patient was evaluated and treated and all questions answered.  She returns for follow-up on her bilateral toe ulcerations.  I debrided the ulcerations sharply with a sterile nipper and ring curette today to remove nonviable tissue and eschar this required local anesthetic administration with 3 cc of lidocaine  into the left third toe and a digital block.  Switch to Prisma collagen dressings this was applied today as well.  Follow-up in 2 to 4 weeks to reevaluate.   No follow-ups on file.   "

## 2025-01-22 DIAGNOSIS — Z79899 Other long term (current) drug therapy: Secondary | ICD-10-CM

## 2025-01-22 DIAGNOSIS — Z940 Kidney transplant status: Principal | ICD-10-CM

## 2025-01-22 MED ORDER — PREDNISONE 5 MG TABLET
ORAL_TABLET | Freq: Every day | ORAL | 3 refills | 90.00000 days | Status: CP
Start: 2025-01-22 — End: ?

## 2025-01-22 MED ORDER — TACROLIMUS 0.5 MG CAPSULE, IMMEDIATE-RELEASE
ORAL_CAPSULE | ORAL | 3 refills | 90.00000 days | Status: CP
Start: 2025-01-22 — End: 2026-01-22

## 2025-01-22 MED ORDER — SIROLIMUS 0.5 MG TABLET
ORAL_TABLET | Freq: Every day | ORAL | 3 refills | 90.00000 days | Status: CP
Start: 2025-01-22 — End: 2026-01-22

## 2025-01-23 ENCOUNTER — Ambulatory Visit (INDEPENDENT_AMBULATORY_CARE_PROVIDER_SITE_OTHER): Admitting: Internal Medicine

## 2025-01-23 VITALS — BP 112/74 | Ht 60.0 in | Wt 146.8 lb

## 2025-01-23 DIAGNOSIS — I70213 Atherosclerosis of native arteries of extremities with intermittent claudication, bilateral legs: Secondary | ICD-10-CM

## 2025-01-23 DIAGNOSIS — Z794 Long term (current) use of insulin: Secondary | ICD-10-CM

## 2025-01-23 DIAGNOSIS — E039 Hypothyroidism, unspecified: Secondary | ICD-10-CM

## 2025-01-23 DIAGNOSIS — D751 Secondary polycythemia: Secondary | ICD-10-CM | POA: Diagnosis not present

## 2025-01-23 DIAGNOSIS — E785 Hyperlipidemia, unspecified: Secondary | ICD-10-CM | POA: Diagnosis not present

## 2025-01-23 DIAGNOSIS — E1159 Type 2 diabetes mellitus with other circulatory complications: Secondary | ICD-10-CM

## 2025-01-23 DIAGNOSIS — I152 Hypertension secondary to endocrine disorders: Secondary | ICD-10-CM

## 2025-01-23 DIAGNOSIS — Z940 Kidney transplant status: Secondary | ICD-10-CM

## 2025-01-23 DIAGNOSIS — E1169 Type 2 diabetes mellitus with other specified complication: Secondary | ICD-10-CM | POA: Diagnosis not present

## 2025-01-23 DIAGNOSIS — E1151 Type 2 diabetes mellitus with diabetic peripheral angiopathy without gangrene: Secondary | ICD-10-CM | POA: Diagnosis not present

## 2025-01-23 MED ORDER — ATORVASTATIN CALCIUM 20 MG PO TABS: 20.0000 mg | ORAL_TABLET | Freq: Every day | ORAL | 1 refills | Status: AC

## 2025-01-23 MED ORDER — CLOPIDOGREL BISULFATE 75 MG PO TABS: 75.0000 mg | ORAL_TABLET | Freq: Every day | ORAL | 1 refills | Status: AC

## 2025-01-23 MED ORDER — LISINOPRIL 10 MG PO TABS: 10.0000 mg | ORAL_TABLET | Freq: Every day | ORAL | 1 refills | Status: AC

## 2025-01-23 MED ORDER — LEVOTHYROXINE SODIUM 100 MCG PO TABS: 100.0000 ug | ORAL_TABLET | ORAL | 1 refills | Status: DC

## 2025-01-23 NOTE — Assessment & Plan Note (Signed)
 Controlled on lisinopril  10 mg daily Reinforced DASH diet and exercise for weight loss Kidney function reviewed

## 2025-01-23 NOTE — Assessment & Plan Note (Signed)
 She will continue clopidogrel  75 mg daily She will continue to follow with vascular

## 2025-01-23 NOTE — Assessment & Plan Note (Signed)
 TSH and free T4 today Continue levothyroxine  100 mcg 5 days weekly, will adjust if needed based on labs

## 2025-01-23 NOTE — Assessment & Plan Note (Addendum)
 Continue prednisone  5 mg daily, tacrolimus  1.5 mg in a.m. and 1 mg in p.m. and sirolimus 1 mg daily She will continue to follow with nephrology and transplant team

## 2025-01-23 NOTE — Progress Notes (Signed)
 "  Subjective:    Patient ID: Cindy Fischer, female    DOB: 09-24-1946, 79 y.o.   MRN: [id number]  HPI  Patient presents to clinic today to establish care and for management of the conditions listed below.  DM 2 with PAD: Her last A1C ws 6.3%, 02/2024. She is taking novolin n and novolin r as prescribed.  She has had stents placed and is taking clopidogrel  as prescribed.  Her sugars 140-300.  Her last eye exam was done at [facility]. Flu 09/2024.  Prevnar 13, 10/2016.  Prevnar 20 11/2022.  COVID x 3.  She does not follow with endocrinology but does follow with vascular.  HTN: Associated with diabetes and CKD. Her BP today is  112/74. She is taking lisinopril  as prescribed. ECG from 04/2024 reviewed.  Hypothyroidism: She denies any issues on levothyroxine .  She does not follow with endocrinology.  History of kidney transplant: Managed with tacrolimus , sirollimus, and prednisone . She follows with nephrology.   HLD: Her last LDL was 57, triglycerides 94, 02/2024.  She denies myalgis on atorvastatin . She tries to consume a low fat diet.  Polycythemia: Her last H/H was 15.3/46, 11/2024.  She does not smoke.  She does not follow with hematology.  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     atorvastatin  (LIPITOR) 20 MG tablet Take 20 mg by mouth daily.     Cholecalciferol (VITAMIN D) 2000 units tablet Take 2,000 Units by mouth daily.     clopidogrel  (PLAVIX ) 75 MG tablet TAKE 1 TABLET EVERY DAY 90 tablet 3   gentamicin  ointment (GARAMYCIN ) 0.1 % Apply 1  Application topically daily. Apply to wound daily (Patient taking differently: Apply 1 Application topically as needed. Apply to wound daily) 30 g 0   insulin  NPH Human (HUMULIN N,NOVOLIN N) 100 UNIT/ML injection Inject 25-28 Units into the skin daily before breakfast.     insulin  regular (NOVOLIN R,HUMULIN R ) 100 units/mL injection Inject 3-10 Units into the skin 3 (three) times daily as needed for high blood sugar. Sliding scale     levothyroxine  (SYNTHROID , LEVOTHROID) 100 MCG tablet Take 100 mcg by mouth See admin instructions. Take 100 mcg daily except skip dose on Saturdays     lisinopril  (PRINIVIL ,ZESTRIL ) 10 MG tablet Take 10 mg by mouth daily.     predniSONE  (DELTASONE ) 5 MG tablet Take 5 mg by mouth daily.      Sirolimus (RAPAMUNE) 0.5 MG tablet Take 1 mg by mouth.     tacrolimus  (PROGRAF ) 0.5 MG capsule Take 1-1.5 mg by mouth See admin instructions. Take 1.5 mg in the morning and 1 mg in the evening     omeprazole (PRILOSEC) 40 MG capsule Take 40 mg by mouth every other day. (Patient not taking: Reported on 01/23/2025)  3   No current facility-administered medications for this visit.    Allergies[1]  Family History  Problem Relation Age of Onset   Multiple myeloma Mother    Diabetes Father  Heart attack Father 61   Breast cancer Neg Hx     Social History   Socioeconomic History   Marital status: Widowed    Spouse name: Not on file   Number of children: Not on file   Years of education: Not on file   Highest education level: Not on file  Occupational History   Not on file  Tobacco Use   Smoking status: Former    Current packs/day: 0.00    Average packs/day: 1 pack/day for 35.0 years (35.0 ttl pk-yrs)    Types: Cigarettes    Start date: 36    Quit date: 2015    Years since quitting: 11.0   Smokeless tobacco: Never  Vaping Use   Vaping status: Never Used  Substance and Sexual Activity   Alcohol use: No   Drug use: No   Sexual activity: Not on file  Other  Topics Concern   Not on file  Social History Narrative   Not on file   Social Drivers of Health   Tobacco Use: Medium Risk (01/23/2025)   Patient History    Smoking Tobacco Use: Former    Smokeless Tobacco Use: Never    Passive Exposure: Not on Actuary Strain: Low Risk (08/27/2023)   Received from [facility]   Overall Financial Resource Strain (CARDIA)    Difficulty of Paying Living Expenses: Not hard at all  Food Insecurity: No Food Insecurity (08/27/2023)   Received from [facility]   Epic    Within the past 12 months, you worried that your food would run out before you got the money to buy more.: Never true    Within the past 12 months, the food you bought just didn't last and you didn't have money to get more.: Never true  Transportation Needs: No Transportation Needs (08/27/2023)   Received from [facility]   PRAPARE - Transportation    Lack of Transportation (Medical): No    Lack of Transportation (Non-Medical): No  Physical Activity: Not on file  Stress: Not on file  Social Connections: Not on file  Intimate Partner Violence: Not on file  Depression (EYV7-0): Not on file  Alcohol Screen: Not on file  Housing: Not on file  Utilities: Low Risk (08/27/2023)   Received from [facility]   Utilities    Within the past 12 months, have you been unable to get utilities(heat, electricity) when it was really needed?: No  Health Literacy: Not on file     Constitutional: Denies fever, malaise, fatigue, headache or abrupt weight changes.  HEENT: Denies eye pain, eye redness, ear pain, ringing in the ears, wax buildup, runny nose, nasal congestion, bloody nose, or sore throat. Respiratory: Denies difficulty breathing, shortness of breath, cough or sputum production.   Cardiovascular: Denies chest pain, chest tightness, palpitations or swelling in the hands or feet.  Gastrointestinal: Denies abdominal pain, bloating, constipation, diarrhea or blood in the  stool.  GU: Denies urgency, frequency, pain with urination, burning sensation, blood in urine, odor or discharge. Musculoskeletal: Denies decrease in range of motion, difficulty with gait, muscle pain or joint pain and swelling.  Skin: Denies redness, rashes, lesions or ulcercations.  Neurological: Denies dizziness, difficulty with memory, difficulty with speech or problems with balance and coordination.  Psych: Denies anxiety, depression, SI/HI.  No other specific complaints in a complete review of systems (except as listed in HPI above).  Objective    BP 112/74 (BP Location: Left Arm,  Patient Position: Sitting, Cuff Size: Normal)   Ht 5' (1.524 m)   Wt 146 lb 12.8 oz (66.6 kg)   BMI 28.67 kg/m  Wt Readings from Last 3 Encounters:  01/23/25 146 lb 12.8 oz (66.6 kg)  01/12/25 150 lb (68 kg)  10/27/24 150 lb (68 kg)    General: Appears their stated age, well developed, well nourished in NAD. Skin: Warm, dry and intact. No rashes, lesions or ulcerations noted. HEENT: Head: normal shape and size; Eyes: sclera white, no icterus, conjunctiva pink, PERRLA and EOMs intact; Ears: Tm's gray and intact, normal light reflex; Nose: mucosa pink and moist, septum midline; Throat/Mouth: Teeth present, mucosa pink and moist, no exudate, lesions or ulcerations noted.  Neck:  Neck supple, trachea midline. No masses, lumps or thyromegaly present.  Cardiovascular: Normal rate and rhythm. S1,S2 noted.  No murmur, rubs or gallops noted. No JVD or BLE edema. No carotid bruits noted. Pulmonary/Chest: Normal effort and positive vesicular breath sounds. No respiratory distress. No wheezes, rales or ronchi noted.  Abdomen: Soft and nontender. Normal bowel sounds. No distention or masses noted. Liver, spleen and kidneys non palpable. Musculoskeletal: Normal range of motion. No signs of joint swelling. No difficulty with gait.  Neurological: Alert and oriented. Cranial nerves II-XII grossly intact. Coordination  normal.  Psychiatric: Mood and affect normal. Behavior is normal. Judgment and thought content normal.   EKG:  BMET    Component Value Date/Time   NA 131 (L) 09/24/2024 0926   NA 138 03/29/2015 0918   K 4.3 09/24/2024 0926   K 4.5 03/29/2015 0918   CL 99 09/24/2024 0926   CL 104 03/29/2015 0918   CO2 22 09/24/2024 0926   CO2 26 03/29/2015 0918   GLUCOSE 131 (H) 09/24/2024 0926   GLUCOSE 187 (H) 03/29/2015 0918   BUN 37 (H) 09/24/2024 0926   BUN 10 03/29/2015 0918   CREATININE 0.84 09/24/2024 0926   CREATININE 0.80 03/29/2015 0918   CALCIUM  8.8 (L) 09/24/2024 0926   CALCIUM  9.1 03/29/2015 0918   GFRNONAA >60 09/24/2024 0926   GFRNONAA >60 03/29/2015 0918   GFRAA >60 09/24/2020 0836   GFRAA >60 03/29/2015 0918    Lipid Panel     Component Value Date/Time   CHOL 145 06/01/2017 0820   TRIG 122 06/01/2017 0820   HDL 70 06/01/2017 0820   CHOLHDL 2.1 06/01/2017 0820   VLDL 24 06/01/2017 0820   LDLCALC 51 06/01/2017 0820    CBC    Component Value Date/Time   WBC 10.5 09/24/2024 0926   RBC 4.74 09/24/2024 0926   HGB 13.9 09/24/2024 0926   HGB 12.0 03/29/2015 0918   HCT 41.6 09/24/2024 0926   HCT 38.7 03/29/2015 0918   PLT 622 (H) 09/24/2024 0926   PLT 624 (H) 03/29/2015 0918   MCV 87.8 09/24/2024 0926   MCV 71 (L) 03/29/2015 0918   MCH 29.3 09/24/2024 0926   MCHC 33.4 09/24/2024 0926   RDW 16.4 (H) 09/24/2024 0926   RDW 25.0 (H) 03/29/2015 0918   LYMPHSABS 3.1 09/24/2024 0926   LYMPHSABS 0.9 (L) 03/29/2015 0918   MONOABS 1.2 (H) 09/24/2024 0926   MONOABS 1.0 (H) 03/29/2015 0918   EOSABS 0.1 09/24/2024 0926   EOSABS 0.2 03/29/2015 0918   BASOSABS 0.0 09/24/2024 0926   BASOSABS 0.0 03/29/2015 0918    Hgb A1C Lab Results  Component Value Date   HGBA1C 5.1 04/16/2021       Assessment and Plan  Angeline Laura, NP     [1]  Allergies Allergen Reactions   Latex Itching    Patient states that she is NOT allergic to latex - 05/26/2023    Voltaren [Diclofenac Sodium] Diarrhea and Nausea And Vomiting    Patient states that she is NOT allergic to Voltaren - 05/26/2023   Oxycodone -Acetaminophen  Nausea And Vomiting    Patient states that she is NOT allergic to Oxycodone -acetaminopehn - 05/26/2023   Penicillins Rash    Has patient had a PCN reaction causing immediate rash, facial/tongue/throat swelling, SOB or lightheadedness with hypotension: Yes Has patient had a PCN reaction causing severe rash involving mucus membranes or skin necrosis: No Has patient had a PCN reaction that required hospitalization: No Has patient had a PCN reaction occurring within the last 10 years: No If all of the above answers are NO, then may proceed with Cephalosporin use.    "

## 2025-01-23 NOTE — Patient Instructions (Signed)
 Healthy Eating for Your Heart Eating a healthy diet is important for the health of your heart. A heart-healthy eating plan includes: Eating less unhealthy fats. Eating more healthy fats. Eating less salt in your food. Salt is also called sodium. Making other changes in your diet. Talk with your doctor or a diet specialist (dietitian) to create an eating plan that is right for you. What is my plan? Your doctor may recommend an eating plan that includes: Total fat: ______% or less of total calories a day. Saturated fat: ______% or less of total calories a day. Cholesterol: less than _________mg a day. Sodium: less than _________mg a day. What are tips for following this plan? Cooking Avoid frying your food. Try to bake, boil, grill, or broil it instead. You can also reduce fat by: Removing the skin from poultry. Removing all visible fats from meats. Steaming vegetables in water or broth. Meal planning  At meals, divide your plate into four equal parts: Fill one-half of your plate with vegetables and green salads. Fill one-fourth of your plate with whole grains. Fill one-fourth of your plate with lean protein foods. Eat 2-4 cups of vegetables per day. One cup of vegetables is: 1 cup (91 g) broccoli or cauliflower florets. 2 medium carrots. 1 large bell pepper. 1 large sweet potato. 1 large tomato. 1 medium white potato. 2 cups (150 g) raw leafy greens. Eat 1-2 cups of fruit per day. One cup of fruit is: 1 small apple 1 large banana 1 cup (237 g) mixed fruit, 1 large orange,  cup (82 g) dried fruit, 1 cup (240 mL) 100% fruit juice. Eat more foods that have soluble fiber. These are apples, broccoli, carrots, beans, peas, and barley. Try to get 20-30 g of fiber per day. Eat 4-5 servings of nuts, legumes, and seeds per week: 1 serving of dried beans or legumes equals  cup (90 g) cooked. 1 serving of nuts is  oz (12 almonds, 24 pistachios, or 7 walnut halves). 1 serving of  seeds equals  oz (8 g). General information Eat more home-cooked food. Eat less restaurant, buffet, and fast food. Limit or avoid alcohol. Limit foods that are high in starch and sugar. Avoid fried foods. Lose weight if you are overweight. Keep track of how much salt (sodium) you eat. This is important if you have high blood pressure. Ask your doctor to tell you more about this. Try to add vegetarian meals each week. Fats Choose healthy fats. These include olive oil and canola oil, flaxseeds, walnuts, almonds, and seeds. Eat more omega-3 fats. These include salmon, mackerel, sardines, tuna, flaxseed oil, and ground flaxseeds. Try to eat fish at least 2 times each week. Check food labels. Avoid foods with trans fats or high amounts of saturated fat. Limit saturated fats. These are often found in animal products, such as meats, butter, and cream. These are also found in plant foods, such as palm oil, palm kernel oil, and coconut oil. Avoid foods with partially hydrogenated oils in them. These have trans fats. Examples are stick margarine, some tub margarines, cookies, crackers, and other baked goods. What foods should I eat? Fruits All fresh, canned (in natural juice), or frozen fruits. Vegetables Fresh or frozen vegetables (raw, steamed, roasted, or grilled). Green salads. Grains Most grains. Choose whole wheat and whole grains most of the time. Rice and pasta, including brown rice and pastas made with whole wheat. Meats and other proteins Lean, well-trimmed beef, veal, pork, and lamb. Chicken and turkey  without skin. All fish and shellfish. Wild duck, rabbit, pheasant, and venison. Egg whites or low-cholesterol egg substitutes. Dried beans, peas, lentils, and tofu. Seeds and most nuts. Dairy Low-fat or nonfat cheeses, including ricotta and mozzarella. Skim or 1% milk that is liquid, powdered, or evaporated. Buttermilk that is made with low-fat milk. Nonfat or low-fat yogurt. Fats and  oils Non-hydrogenated (trans-free) margarines. Vegetable oils, including soybean, sesame, sunflower, olive, peanut, safflower, corn, canola, and cottonseed. Salad dressings or mayonnaise made with a vegetable oil. Beverages Mineral water. Coffee and tea. Diet carbonated beverages. Sweets and desserts Sherbet, gelatin, and fruit ice. Small amounts of dark chocolate. Limit all sweets and desserts. Seasonings and condiments All seasonings and condiments. The items listed above may not be a complete list of foods and drinks you can eat. Contact a dietitian for more options. What foods should I avoid? Fruits Canned fruit in heavy syrup. Fruit in cream or butter sauce. Fried fruit. Limit coconut. Vegetables Vegetables cooked in cheese, cream, or butter sauce. Fried vegetables. Grains Breads that are made with saturated or trans fats, oils, or whole milk. Croissants. Sweet rolls. Donuts. High-fat crackers, such as cheese crackers. Meats and other proteins Fatty meats, such as hot dogs, ribs, sausage, bacon, rib-eye roast or steak. High-fat deli meats, such as salami and bologna. Caviar. Domestic duck and goose. Organ meats, such as liver. Dairy Cream, sour cream, cream cheese, and creamed cottage cheese. Whole-milk cheeses. Whole or 2% milk that is liquid, evaporated, or condensed. Whole buttermilk. Cream sauce or high-fat cheese sauce. Yogurt that is made from whole milk. Fats and oils Meat fat, or shortening. Cocoa butter, hydrogenated oils, palm oil, coconut oil, palm kernel oil. Solid fats and shortenings, including bacon fat, salt pork, lard, and butter. Nondairy cream substitutes. Salad dressings with cheese or sour cream. Beverages Regular sodas and juice drinks with added sugar. Sweets and desserts Frosting. Pudding. Cookies. Cakes. Pies. Milk chocolate or white chocolate. Buttered syrups. Full-fat ice cream or ice cream drinks. The items listed above may not be a complete list of foods  and drinks to avoid. Contact a dietitian for more information. Summary Heart-healthy meal planning includes eating less unhealthy fats, eating more healthy fats, and making other changes in your diet. Eat a balanced diet. This includes fruits and vegetables, low-fat or nonfat dairy, lean protein, nuts and legumes, whole grains, and heart-healthy oils and fats. This information is not intended to replace advice given to you by your health care provider. Make sure you discuss any questions you have with your health care provider. Document Revised: 10/20/2024 Document Reviewed: 01/16/2022 Elsevier Patient Education  2025 [employer].

## 2025-01-23 NOTE — Assessment & Plan Note (Signed)
 Lipid profile today Encouraged her to consume a low-fat diet Continue atorvastatin  20 mg daily

## 2025-01-23 NOTE — Assessment & Plan Note (Signed)
 A1c today Urine microalbumin has been checked within the last year Continue novolin n 35 units twice daily and novolin r per sliding scale Encouraged low-carb diet Encourage routine eye exam Encouraged routine foot exam

## 2025-01-23 NOTE — Assessment & Plan Note (Signed)
 Likely due to chronic prednisone  use CBC reviewed

## 2025-01-24 LAB — T4, FREE: Free T4: 0.2 ng/dL — ABNORMAL LOW (ref 0.8–1.8)

## 2025-01-24 LAB — LIPID PANEL
Cholesterol: 189 mg/dL
HDL: 67 mg/dL
LDL Cholesterol (Calc): 98 mg/dL
Non-HDL Cholesterol (Calc): 122 mg/dL
Total CHOL/HDL Ratio: 2.8 (calc)
Triglycerides: 143 mg/dL

## 2025-01-24 LAB — HEMOGLOBIN A1C
Hgb A1c MFr Bld: 8.2 % — ABNORMAL HIGH
Mean Plasma Glucose: 189 mg/dL
eAG (mmol/L): 10.4 mmol/L

## 2025-01-24 LAB — TSH: TSH: 109.95 m[IU]/L — ABNORMAL HIGH (ref 0.40–4.50)

## 2025-01-26 ENCOUNTER — Ambulatory Visit: Payer: Self-pay | Admitting: Internal Medicine

## 2025-01-26 DIAGNOSIS — Z940 Kidney transplant status: Principal | ICD-10-CM

## 2025-01-26 DIAGNOSIS — E039 Hypothyroidism, unspecified: Secondary | ICD-10-CM

## 2025-01-27 MED ORDER — LEVOTHYROXINE SODIUM 125 MCG PO TABS: 125.0000 ug | ORAL_TABLET | Freq: Every day | ORAL | 1 refills | Status: AC

## 2025-02-09 ENCOUNTER — Ambulatory Visit: Admitting: Podiatry

## 2025-02-20 ENCOUNTER — Other Ambulatory Visit

## 2025-04-27 ENCOUNTER — Encounter (INDEPENDENT_AMBULATORY_CARE_PROVIDER_SITE_OTHER)

## 2025-04-27 ENCOUNTER — Ambulatory Visit (INDEPENDENT_AMBULATORY_CARE_PROVIDER_SITE_OTHER): Admitting: Vascular Surgery

## 2025-07-27 ENCOUNTER — Encounter: Admitting: Internal Medicine
